# Patient Record
Sex: Female | Born: 1960 | Race: White | Hispanic: No | Marital: Married | Smoking: Former smoker
Health system: Southern US, Community
[De-identification: ages and names within clinical notes are randomized; demographics above are authoritative.]

## PROBLEM LIST (undated history)

## (undated) DIAGNOSIS — C801 Malignant (primary) neoplasm, unspecified: Secondary | ICD-10-CM

## (undated) DIAGNOSIS — K5790 Diverticulosis of intestine, part unspecified, without perforation or abscess without bleeding: Secondary | ICD-10-CM

## (undated) DIAGNOSIS — F32A Depression, unspecified: Secondary | ICD-10-CM

## (undated) DIAGNOSIS — R000 Tachycardia, unspecified: Secondary | ICD-10-CM

## (undated) DIAGNOSIS — D6862 Lupus anticoagulant syndrome: Secondary | ICD-10-CM

## (undated) DIAGNOSIS — M1990 Unspecified osteoarthritis, unspecified site: Secondary | ICD-10-CM

## (undated) DIAGNOSIS — Q631 Lobulated, fused and horseshoe kidney: Secondary | ICD-10-CM

## (undated) DIAGNOSIS — R519 Headache, unspecified: Secondary | ICD-10-CM

## (undated) DIAGNOSIS — Z8739 Personal history of other diseases of the musculoskeletal system and connective tissue: Secondary | ICD-10-CM

## (undated) DIAGNOSIS — Z9889 Other specified postprocedural states: Secondary | ICD-10-CM

## (undated) DIAGNOSIS — R112 Nausea with vomiting, unspecified: Secondary | ICD-10-CM

## (undated) DIAGNOSIS — C4491 Basal cell carcinoma of skin, unspecified: Secondary | ICD-10-CM

## (undated) DIAGNOSIS — Z86718 Personal history of other venous thrombosis and embolism: Secondary | ICD-10-CM

## (undated) DIAGNOSIS — E663 Overweight: Secondary | ICD-10-CM

## (undated) DIAGNOSIS — H547 Unspecified visual loss: Secondary | ICD-10-CM

## (undated) DIAGNOSIS — F329 Major depressive disorder, single episode, unspecified: Secondary | ICD-10-CM

## (undated) DIAGNOSIS — E039 Hypothyroidism, unspecified: Secondary | ICD-10-CM

## (undated) DIAGNOSIS — G4730 Sleep apnea, unspecified: Secondary | ICD-10-CM

## (undated) DIAGNOSIS — Z8719 Personal history of other diseases of the digestive system: Secondary | ICD-10-CM

## (undated) DIAGNOSIS — T8859XA Other complications of anesthesia, initial encounter: Secondary | ICD-10-CM

## (undated) DIAGNOSIS — D699 Hemorrhagic condition, unspecified: Secondary | ICD-10-CM

## (undated) DIAGNOSIS — J3489 Other specified disorders of nose and nasal sinuses: Secondary | ICD-10-CM

## (undated) DIAGNOSIS — I872 Venous insufficiency (chronic) (peripheral): Secondary | ICD-10-CM

## (undated) DIAGNOSIS — I10 Essential (primary) hypertension: Secondary | ICD-10-CM

## (undated) DIAGNOSIS — Q661 Congenital talipes calcaneovarus, unspecified foot: Secondary | ICD-10-CM

## (undated) DIAGNOSIS — J45909 Unspecified asthma, uncomplicated: Secondary | ICD-10-CM

## (undated) DIAGNOSIS — E042 Nontoxic multinodular goiter: Secondary | ICD-10-CM

## (undated) DIAGNOSIS — F419 Anxiety disorder, unspecified: Secondary | ICD-10-CM

## (undated) DIAGNOSIS — Z9884 Bariatric surgery status: Secondary | ICD-10-CM

## (undated) DIAGNOSIS — J302 Other seasonal allergic rhinitis: Secondary | ICD-10-CM

## (undated) DIAGNOSIS — N200 Calculus of kidney: Secondary | ICD-10-CM

## (undated) DIAGNOSIS — E890 Postprocedural hypothyroidism: Secondary | ICD-10-CM

## (undated) DIAGNOSIS — Z87442 Personal history of urinary calculi: Secondary | ICD-10-CM

## (undated) HISTORY — PX: ABDOMINAL HERNIA REPAIR: SHX539

## (undated) HISTORY — DX: Unspecified osteoarthritis, unspecified site: M19.90

## (undated) HISTORY — DX: Congenital talipes calcaneovarus, unspecified foot: Q66.10

## (undated) HISTORY — DX: Depression, unspecified: F32.A

## (undated) HISTORY — DX: Lupus anticoagulant syndrome: D68.62

## (undated) HISTORY — DX: Other seasonal allergic rhinitis: J30.2

## (undated) HISTORY — DX: Bariatric surgery status: Z98.84

## (undated) HISTORY — DX: Diverticulosis of intestine, part unspecified, without perforation or abscess without bleeding: K57.90

## (undated) HISTORY — DX: Venous insufficiency (chronic) (peripheral): I87.2

## (undated) HISTORY — DX: Major depressive disorder, single episode, unspecified: F32.9

## (undated) HISTORY — DX: Personal history of other venous thrombosis and embolism: Z86.718

## (undated) HISTORY — PX: JOINT REPLACEMENT: SHX530

## (undated) HISTORY — DX: Calculus of kidney: N20.0

## (undated) HISTORY — PX: ABDOMINAL HYSTERECTOMY: SHX81

## (undated) HISTORY — DX: Overweight: E66.3

## (undated) HISTORY — PX: OTHER SURGICAL HISTORY: SHX169

## (undated) HISTORY — DX: Essential (primary) hypertension: I10

## (undated) HISTORY — DX: Unspecified asthma, uncomplicated: J45.909

## (undated) HISTORY — DX: Nontoxic multinodular goiter: E04.2

## (undated) HISTORY — DX: Lobulated, fused and horseshoe kidney: Q63.1

## (undated) HISTORY — DX: Postprocedural hypothyroidism: E89.0

## (undated) HISTORY — PX: HERNIA REPAIR: SHX51

## (undated) HISTORY — PX: CYST EXCISION: SHX5701

## (undated) HISTORY — PX: SKIN SURGERY: SHX2413

## (undated) HISTORY — DX: Personal history of other diseases of the digestive system: Z87.19

## (undated) HISTORY — DX: Other specified disorders of nose and nasal sinuses: J34.89

## (undated) HISTORY — DX: Hypothyroidism, unspecified: E03.9

## (undated) HISTORY — DX: Hemorrhagic condition, unspecified: D69.9

---
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
## 1987-06-17 HISTORY — PX: PARTIAL HYSTERECTOMY: SHX80

## 1996-06-16 HISTORY — PX: CHOLECYSTECTOMY: SHX55

## 2004-06-11 ENCOUNTER — Ambulatory Visit: Payer: Self-pay | Admitting: Family Medicine

## 2004-06-13 ENCOUNTER — Ambulatory Visit: Payer: Self-pay | Admitting: Family Medicine

## 2004-10-07 ENCOUNTER — Ambulatory Visit: Payer: Self-pay | Admitting: Surgery

## 2004-10-22 ENCOUNTER — Other Ambulatory Visit: Payer: Self-pay

## 2004-10-24 ENCOUNTER — Ambulatory Visit: Payer: Self-pay | Admitting: Surgery

## 2004-12-13 ENCOUNTER — Ambulatory Visit: Payer: Self-pay | Admitting: Family Medicine

## 2004-12-13 ENCOUNTER — Other Ambulatory Visit: Payer: Self-pay

## 2005-05-21 DIAGNOSIS — Z9884 Bariatric surgery status: Secondary | ICD-10-CM

## 2005-05-21 HISTORY — DX: Bariatric surgery status: Z98.84

## 2005-05-21 HISTORY — PX: GASTRIC BYPASS: SHX52

## 2005-06-16 DIAGNOSIS — I872 Venous insufficiency (chronic) (peripheral): Secondary | ICD-10-CM

## 2005-06-16 HISTORY — DX: Venous insufficiency (chronic) (peripheral): I87.2

## 2005-10-24 ENCOUNTER — Ambulatory Visit: Payer: Self-pay | Admitting: Family Medicine

## 2007-03-03 ENCOUNTER — Ambulatory Visit: Payer: Self-pay | Admitting: Family Medicine

## 2007-06-17 DIAGNOSIS — Z8711 Personal history of peptic ulcer disease: Secondary | ICD-10-CM

## 2007-06-17 HISTORY — PX: BACK SURGERY: SHX140

## 2007-06-17 HISTORY — DX: Personal history of peptic ulcer disease: Z87.11

## 2008-04-05 ENCOUNTER — Ambulatory Visit: Payer: Self-pay | Admitting: Unknown Physician Specialty

## 2008-04-06 ENCOUNTER — Ambulatory Visit: Payer: Self-pay | Admitting: Unknown Physician Specialty

## 2008-04-30 ENCOUNTER — Encounter: Admission: RE | Admit: 2008-04-30 | Discharge: 2008-04-30 | Payer: Self-pay | Admitting: Neurosurgery

## 2008-05-16 ENCOUNTER — Ambulatory Visit (HOSPITAL_COMMUNITY): Admission: RE | Admit: 2008-05-16 | Discharge: 2008-05-16 | Payer: Self-pay | Admitting: Neurosurgery

## 2008-06-14 ENCOUNTER — Ambulatory Visit (HOSPITAL_COMMUNITY): Admission: RE | Admit: 2008-06-14 | Discharge: 2008-06-14 | Payer: Self-pay | Admitting: Neurosurgery

## 2008-06-14 ENCOUNTER — Encounter (INDEPENDENT_AMBULATORY_CARE_PROVIDER_SITE_OTHER): Payer: Self-pay | Admitting: Neurosurgery

## 2008-07-07 ENCOUNTER — Ambulatory Visit: Payer: Self-pay | Admitting: Urology

## 2008-07-10 ENCOUNTER — Ambulatory Visit: Payer: Self-pay | Admitting: Urology

## 2008-07-13 ENCOUNTER — Ambulatory Visit: Payer: Self-pay | Admitting: Urology

## 2008-07-17 ENCOUNTER — Ambulatory Visit: Payer: Self-pay | Admitting: Internal Medicine

## 2008-07-17 ENCOUNTER — Inpatient Hospital Stay: Payer: Self-pay | Admitting: Urology

## 2008-07-31 ENCOUNTER — Ambulatory Visit: Payer: Self-pay | Admitting: Internal Medicine

## 2008-07-31 ENCOUNTER — Ambulatory Visit: Payer: Self-pay | Admitting: Urology

## 2008-08-10 ENCOUNTER — Ambulatory Visit: Payer: Self-pay | Admitting: Urology

## 2008-08-14 ENCOUNTER — Ambulatory Visit: Payer: Self-pay | Admitting: Internal Medicine

## 2008-08-21 ENCOUNTER — Ambulatory Visit: Payer: Self-pay | Admitting: Urology

## 2008-09-07 ENCOUNTER — Ambulatory Visit: Payer: Self-pay | Admitting: Urology

## 2008-09-11 ENCOUNTER — Ambulatory Visit: Payer: Self-pay | Admitting: Urology

## 2008-09-14 ENCOUNTER — Ambulatory Visit: Payer: Self-pay | Admitting: Internal Medicine

## 2008-09-25 ENCOUNTER — Ambulatory Visit: Payer: Self-pay | Admitting: Urology

## 2008-10-14 ENCOUNTER — Ambulatory Visit: Payer: Self-pay | Admitting: Internal Medicine

## 2008-10-26 ENCOUNTER — Ambulatory Visit: Payer: Self-pay | Admitting: Cardiology

## 2008-10-26 ENCOUNTER — Other Ambulatory Visit: Payer: Self-pay | Admitting: Anesthesiology

## 2008-10-30 ENCOUNTER — Ambulatory Visit: Payer: Self-pay | Admitting: Urology

## 2008-11-08 ENCOUNTER — Ambulatory Visit: Payer: Self-pay | Admitting: Urology

## 2009-04-09 ENCOUNTER — Ambulatory Visit: Payer: Self-pay | Admitting: Unknown Physician Specialty

## 2009-05-21 ENCOUNTER — Ambulatory Visit: Payer: Self-pay | Admitting: Urology

## 2009-09-12 ENCOUNTER — Ambulatory Visit: Payer: Self-pay | Admitting: Unknown Physician Specialty

## 2010-04-10 ENCOUNTER — Ambulatory Visit: Payer: Self-pay | Admitting: Unknown Physician Specialty

## 2010-04-15 ENCOUNTER — Ambulatory Visit: Payer: Self-pay | Admitting: Unknown Physician Specialty

## 2010-10-29 NOTE — Op Note (Signed)
Claire Strong, Claire Strong                ACCOUNT NO.:  [id number]   MEDICAL RECORD NO.:  [id number]          PATIENT TYPE:  OIB   LOCATION:  5409                         FACILITY:  [facility]   PHYSICIAN:  Ophelia Charter, M.D.DATE OF BIRTH:  [birth year]   DATE OF PROCEDURE:  06/14/2008  DATE OF DISCHARGE:  06/14/2008                               OPERATIVE REPORT   BRIEF HISTORY:  The patient is a 50 year old white female who has had  perineal numbness for sometime.  She was worked up with a lumbar MRI,  which demonstrated the patient had sacral epidural lipomatosis.  She was  here for my consultation.  I worked up further with a lumbar myelo CT,  which demonstrated there was abrupt stoppage of the dye column just  above the sacrum.  I discussed the various treatment options with the  patient including a sacral laminectomy for decompression of her sacral  nerves as well as a removal of the epidural lipomatosis.  The patient  weighed the risks, benefits, and alternatives of the surgery and decided  to proceed with the operation.   PREOPERATIVE DIAGNOSES:  1. Sacral epidural lipomatosis.  2. Lumbago.  3. Lumbar radiculopathy.   POSTOPERATIVE DIAGNOSES:  1. Sacral epidural lipomatosis.  2. Lumbago.  3. Lumbar radiculopathy.   PROCEDURE:  1. Decompressive laminectomy.  2. Removal of epidural lipomatosis using microdissection.   SURGEON:  Ophelia Charter, MD   ASSISTANT:  Hosie Spangle, MD   ANESTHESIA:  General endotracheal.   ESTIMATED BLOOD LOSS:  50 mL.   SPECIMENS:  Epidural tissue.   DRAINS:  None.   COMPLICATIONS:  None.   DESCRIPTION OF PROCEDURE:  The patient was brought to the operating room  by the Anesthesia Team.  General endotracheal anesthesia was induced.  The patient was turned to the prone position on the Wilson frame.  Her  lumbosacral region was then prepared with Betadine scrub and Betadine  solution.  Sterile drapes were applied.  I then injected  the area to be  incised with Marcaine with epinephrine solution.  I used a scalpel to  make a linear midline incision over the upper sacrum.  I used  electrocautery to perform a bilateral subperiosteal dissection exposing  the spinous process and lamina of the upper sacrum.  I used the  cerebellar retraction for exposure.  We obtained intraoperative  radiograph to confirm our location.   We began the decompression by using a high-speed drill to thin out the  midline cephalad sacral lamina.  We drilled until the lamina was very  thin.  We then used a Kerrison punch to remove the approximately S1 and  S2 lamina.  We also removed the ligamentum flavum at L5-S1.   We then brought the operative microscope into the field, and under  electromagnification and illumination, we completed the  microdissection/decompression.  We used microdissection to free up the  thecal sac and the sacral nerve roots from the epidural lipomatosis,  removed the epidural fat in multiple fragments using the micropituitary  forceps.  We sent the specimens off to the Pathology to  confirm simple  epidural fat.  We got good decompression of the caudal thecal sac and  the extending sacral nerve roots.  Of note, the patient did appear to  have good joint S1, S2, and S3 nerve roots on the right.  We then  obtained hemostasis using bipolar electrocautery.  We irrigated the  wound out with bacitracin solution.  We removed the retractors and then  reapproximated the patient's thoracolumbar fascia with interrupted #1  Vicryl suture, subcutaneous tissue with interrupted 2-0 Vicryl suture,  and the skin with Steri-Strips and Benzoin.  The wound was then coated  with bacitracin ointment and sterile dressing applied.  The drapes were  removed and the patient was subsequently returned to supine position  where she was extubated by the anesthesia team and transported to  postanesthesia care unit in stable condition.  All sponge,  instrument,  and needle counts were correct at the end of the case.      Cristi Loron, M.D.  Electronically Signed     JDJ/MEDQ  D:  06/15/2008  T:  06/16/2008  Job:  [id number]

## 2010-12-19 ENCOUNTER — Ambulatory Visit (INDEPENDENT_AMBULATORY_CARE_PROVIDER_SITE_OTHER): Payer: BC Managed Care – PPO | Admitting: Family Medicine

## 2010-12-19 ENCOUNTER — Encounter: Payer: Self-pay | Admitting: Family Medicine

## 2010-12-19 VITALS — BP 124/80 | HR 64 | Temp 98.5°F | Ht 69.0 in | Wt 207.0 lb

## 2010-12-19 DIAGNOSIS — F3289 Other specified depressive episodes: Secondary | ICD-10-CM

## 2010-12-19 DIAGNOSIS — Q631 Lobulated, fused and horseshoe kidney: Secondary | ICD-10-CM

## 2010-12-19 DIAGNOSIS — D6859 Other primary thrombophilia: Secondary | ICD-10-CM

## 2010-12-19 DIAGNOSIS — F32A Depression, unspecified: Secondary | ICD-10-CM | POA: Insufficient documentation

## 2010-12-19 DIAGNOSIS — I10 Essential (primary) hypertension: Secondary | ICD-10-CM

## 2010-12-19 DIAGNOSIS — J3489 Other specified disorders of nose and nasal sinuses: Secondary | ICD-10-CM

## 2010-12-19 DIAGNOSIS — Q638 Other specified congenital malformations of kidney: Secondary | ICD-10-CM

## 2010-12-19 DIAGNOSIS — J209 Acute bronchitis, unspecified: Secondary | ICD-10-CM | POA: Insufficient documentation

## 2010-12-19 DIAGNOSIS — F329 Major depressive disorder, single episode, unspecified: Secondary | ICD-10-CM

## 2010-12-19 DIAGNOSIS — R05 Cough: Secondary | ICD-10-CM

## 2010-12-19 DIAGNOSIS — D6862 Lupus anticoagulant syndrome: Secondary | ICD-10-CM | POA: Insufficient documentation

## 2010-12-19 DIAGNOSIS — Z23 Encounter for immunization: Secondary | ICD-10-CM

## 2010-12-19 DIAGNOSIS — Z9884 Bariatric surgery status: Secondary | ICD-10-CM

## 2010-12-19 DIAGNOSIS — R059 Cough, unspecified: Secondary | ICD-10-CM

## 2010-12-19 LAB — BASIC METABOLIC PANEL
CO2: 26 mEq/L (ref 19–32)
Calcium: 8.4 mg/dL (ref 8.4–10.5)
Creatinine, Ser: 0.8 mg/dL (ref 0.4–1.2)
Potassium: 3.8 mEq/L (ref 3.5–5.1)
Sodium: 136 mEq/L (ref 135–145)

## 2010-12-19 MED ORDER — EPINEPHRINE 0.3 MG/0.3ML IJ DEVI: 0.3000 mg | Freq: Once | INTRAMUSCULAR | Status: DC

## 2010-12-19 MED ORDER — FLUTICASONE-SALMETEROL 100-50 MCG/DOSE IN AEPB: 1.0000 | INHALATION_SPRAY | Freq: Two times a day (BID) | RESPIRATORY_TRACT | Status: DC

## 2010-12-19 NOTE — Assessment & Plan Note (Signed)
Stable on low dose ACEI.  Continue med. Await records.

## 2010-12-19 NOTE — Assessment & Plan Note (Signed)
Doesn't think improving on celexa 20mg .  Consider increasing to 40mg  daily.  Did not have time to further discuss, will more thoroughly eval next visit.

## 2010-12-19 NOTE — Assessment & Plan Note (Signed)
States gets labwork yearly.  Will obtain next physical (iron, vitamin levels, etc)

## 2010-12-19 NOTE — Assessment & Plan Note (Signed)
Seems stable, but given pt endorsing increasing in size, will refer to ent for their opinion.

## 2010-12-19 NOTE — Progress Notes (Signed)
Subjective:    Patient ID: Claire Strong, female    DOB: 14-Mar-1961, 50 y.o.   MRN: [id number]  HPI CC: new patient, establish  Previous PCP Dr. Yetta Barre.  First time here, has been here with mother in law and husband recently Advertising copywriter).  Cough - for 3-4 wks.  Actually subsided a lot.  Not productive.  No fevers.  + swollen glands.  No ST, abd pain, ear pain or tooth pain, + sinus congestion, better now.  Chloraseptic didn't help.  Took albuterol and advair.  Tried allegra, helped some.  H/o deviated septum s/p surgery as teenager, states stents stuck together and since then has had hole in septum.  States getting larger, nontender.  Denies rec drug use.  H/o horseshoe kidney (sole), R kidney damaged.  H/o kidney stones.  Sees Dr. [facility]. Lupus anticoagulant positive.  Previously saw Dr. Sherrlyn Hock Heme for lupus anticoagulant, released. H/o back surgery ([facility] Dr. Judie Grieve), released.  Requests epi pen, allergic to bees.  Preventative: Last CPE 03/2010, with blood work. Well woman at Dr. Ashley Jacobs, office closed. Last mammogram 03/2010.  Last pap smear 2009, always normal. S/p hysterectomy for uterine prolapse 1989.  Ovaries remain. Thinks may be due for tetanus shot.  Medications and allergies reviewed and updated in chart. There is no problem list on file for this patient.  Past Medical History  Diagnosis Date  . Lupus anticoagulant disorder   . Arthritis   . Depression   . HTN (hypertension)   . Kidney stones     s/p renal hematoma after lithotripsy  . Horseshoe kidney     sole, R kidney damage  . 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 Jacqulyn Ducking  . Ulcer 2009  . DVT (deep venous thrombosis)     after 1st pregnancy   Past Surgical History  Procedure Date  . Gastric bypass 05/21/05    Dr. Jacqulyn Ducking, Roux-en-Y  . Partial hysterectomy 1989    ovaries remain  . Back surgery 2009  . Cholecystectomy 1998  . Appendectomy 1978  . Tonsillectomy  1969  . Nasal septum surgery 1981    deviated septum, repaired at [facility]   History  Substance Use Topics  . Smoking status: Former Smoker    Quit date: 06/16/2004  . Smokeless tobacco: Never Used   Comment: Socially-no longer  . Alcohol Use: Yes     Occasionally-wine   Family History  Problem Relation Age of Onset  . Diabetes Mother     prediabetes  . Alzheimer's disease Mother   . Hyperlipidemia Mother   . Diabetes Father   . Coronary artery disease Father     CABG  . Alcohol abuse Father   . Cancer Father     lung  . Cancer Maternal Uncle     brain  . Diabetes Paternal Grandmother   . Stroke Neg Hx    Allergies  Allergen Reactions  . Sulfa Antibiotics Shortness Of Breath  . Bee Swelling   No current outpatient prescriptions on file prior to visit.   Review of Systems  Constitutional: Negative for fever, chills, activity change, appetite change, fatigue and unexpected weight change.  HENT: Negative for hearing loss and neck pain.   Eyes: Negative for visual disturbance.  Respiratory: Negative for cough, chest tightness, shortness of breath and wheezing.   Cardiovascular: Negative for chest pain, palpitations and leg swelling.  Gastrointestinal: Negative for nausea, vomiting, abdominal pain, diarrhea, constipation, blood in stool and abdominal distention.  Genitourinary: Negative for hematuria and difficulty urinating.  Musculoskeletal: Negative for myalgias and arthralgias.  Skin: Negative for rash.  Neurological: Negative for dizziness, seizures, syncope and headaches.  Hematological: Does not bruise/bleed easily.  Psychiatric/Behavioral: Positive for dysphoric mood. The patient is not nervous/anxious.        Objective:   Physical Exam  Nursing note and vitals reviewed. Constitutional: She is oriented to person, place, and time. She appears well-developed and well-nourished. No distress.  HENT:  Head: Normocephalic and atraumatic.  Right Ear: Hearing,  tympanic membrane, external ear and ear canal normal.  Left Ear: Hearing, tympanic membrane, external ear and ear canal normal.  Nose: Nasal deformity present.  Mouth/Throat: Uvula is midline, oropharynx is clear and moist and mucous membranes are normal. No oropharyngeal exudate, posterior oropharyngeal edema, posterior oropharyngeal erythema or tonsillar abscesses.       Large midline nasal septal perforation  Eyes: Conjunctivae and EOM are normal. Pupils are equal, round, and reactive to light. No scleral icterus.  Neck: Normal range of motion. Neck supple. No thyromegaly present.  Cardiovascular: Normal rate, regular rhythm, normal heart sounds and intact distal pulses.   No murmur heard. Pulses:      Radial pulses are 2+ on the right side, and 2+ on the left side.  Pulmonary/Chest: Effort normal and breath sounds normal. No respiratory distress. She has no wheezes. She has no rales.  Abdominal: Soft. Bowel sounds are normal. She exhibits no distension and no mass. There is no tenderness. There is no rebound and no guarding.  Musculoskeletal: Normal range of motion.  Lymphadenopathy:    She has no cervical adenopathy.  Neurological: She is alert and oriented to person, place, and time.       CN grossly intact, station and gait intact  Skin: Skin is warm and dry. No rash noted.  Psychiatric: She has a normal mood and affect. Her behavior is normal. Judgment and thought content normal.          Assessment & Plan:

## 2010-12-19 NOTE — Assessment & Plan Note (Signed)
Going on 3-4 wks, actually improving. Likely post-viral cough, continue to monitor. Pt states has been told has asthma, will have her do spirometry pre/post alb when returns for f/u. Refilled advair in meantime.

## 2010-12-19 NOTE — Patient Instructions (Addendum)
Epi pen sent in as well as refills needed. advair refill as well as samples. Tdap today. Good to see you today, call us with questions. Return in 1-2 months for follow up and discuss celexa. I will look into treatment of hole in nose and may refer you to ENT.  If we do we will give you a call regarding referral. I will request records from previously physicians. Return in October for physical and fasting blood work prior.

## 2010-12-19 NOTE — Assessment & Plan Note (Signed)
Per pt, will request records from urology. Check Cr today for baseline while we get records.

## 2010-12-23 ENCOUNTER — Ambulatory Visit: Payer: Self-pay | Admitting: Family Medicine

## 2010-12-30 ENCOUNTER — Ambulatory Visit: Payer: Self-pay | Admitting: Family Medicine

## 2011-01-24 ENCOUNTER — Ambulatory Visit: Payer: Self-pay | Admitting: Otolaryngology

## 2011-01-29 ENCOUNTER — Ambulatory Visit: Payer: Self-pay | Admitting: Otolaryngology

## 2011-02-20 ENCOUNTER — Ambulatory Visit: Payer: BC Managed Care – PPO | Admitting: Family Medicine

## 2011-02-24 ENCOUNTER — Encounter: Payer: Self-pay | Admitting: Family Medicine

## 2011-02-24 ENCOUNTER — Ambulatory Visit (INDEPENDENT_AMBULATORY_CARE_PROVIDER_SITE_OTHER): Payer: BC Managed Care – PPO | Admitting: Family Medicine

## 2011-02-24 VITALS — BP 110/80 | HR 80 | Temp 98.6°F | Wt 210.0 lb

## 2011-02-24 DIAGNOSIS — F3289 Other specified depressive episodes: Secondary | ICD-10-CM

## 2011-02-24 DIAGNOSIS — I10 Essential (primary) hypertension: Secondary | ICD-10-CM

## 2011-02-24 DIAGNOSIS — J3489 Other specified disorders of nose and nasal sinuses: Secondary | ICD-10-CM

## 2011-02-24 DIAGNOSIS — Z9884 Bariatric surgery status: Secondary | ICD-10-CM

## 2011-02-24 DIAGNOSIS — Q631 Lobulated, fused and horseshoe kidney: Secondary | ICD-10-CM

## 2011-02-24 DIAGNOSIS — Q638 Other specified congenital malformations of kidney: Secondary | ICD-10-CM

## 2011-02-24 DIAGNOSIS — J40 Bronchitis, not specified as acute or chronic: Secondary | ICD-10-CM

## 2011-02-24 DIAGNOSIS — F329 Major depressive disorder, single episode, unspecified: Secondary | ICD-10-CM

## 2011-02-24 DIAGNOSIS — F32A Depression, unspecified: Secondary | ICD-10-CM

## 2011-02-24 NOTE — Assessment & Plan Note (Signed)
Now followed by ENT.  They are hesitant for surgery.

## 2011-02-24 NOTE — Assessment & Plan Note (Signed)
?   Bronchitis, thought attributable to allergic rhinitis (not asthma as much) Started on singulair daily by ENT, since then noticed significant improvement in cough as well as PNDrip.

## 2011-02-24 NOTE — Progress Notes (Signed)
  Subjective:    Patient ID: Claire Strong, female    DOB: 07-08-1960, 50 y.o.   MRN: [id number]  HPI CC: 1 mo f/u  CPE scheduled for October, coming end of Sept for blood work.  Needs CBC, iron panel, calcium, Vit B1, B12, folate and vit D.  Saw ENT doc for nasal perf - was infected and treating with vaseline which has helped.  No surgery but he will keep eye on this. Also found to have thyroid nodules and monitoring with rpt [place] in 6 mo.  TSH normal. Started on singulair and to start allergy shots Thursday.  Allergic to 7/15 molds.  Starting singulair has significantly helped.  Not taking advair as much.  PNDrip significantly improved as well.  Mood - not good, could be going through menopause.  Hysterectomy 23 years ago, kept ovaries.  On celexa 20mg  for 3 months now.  Not noticed much improvement since starting med.  A lot going on with husband and at home.    GYN - previously saw Dr. Davis Gourd, he is no longer in practice.  Started on celexa by him.  Would like to have well woman done here.  Last mammo 03/2010, done at [place].  Review of Systems Per HPI    Objective:   Physical Exam  Nursing note and vitals reviewed. Constitutional: She appears well-developed and well-nourished. No distress.  HENT:  Head: Normocephalic and atraumatic.  Right Ear: External ear normal.  Left Ear: External ear normal.  Nose: Nose normal.  Mouth/Throat: Oropharynx is clear and moist. No oropharyngeal exudate.  Eyes: Conjunctivae and EOM are normal. Pupils are equal, round, and reactive to light. No scleral icterus.  Neck: Normal range of motion. Neck supple.  Cardiovascular: Normal rate, regular rhythm, normal heart sounds and intact distal pulses.   No murmur heard. Pulmonary/Chest: Effort normal and breath sounds normal. No respiratory distress. She has no wheezes. She has no rales.  Musculoskeletal: She exhibits no edema.  Lymphadenopathy:    She has no cervical adenopathy.  Skin: Skin is warm  and dry. No rash noted.  Psychiatric: She has a normal mood and affect.          Assessment & Plan:

## 2011-02-24 NOTE — Assessment & Plan Note (Signed)
Check vitamin levels when returns for blood work.

## 2011-02-24 NOTE — Assessment & Plan Note (Signed)
BP: 110/80 mmHg   Good control.  Continue ACEI.

## 2011-02-24 NOTE — Patient Instructions (Signed)
Return for blood work fasting at end of month. Increase celexa to 30mg  daily (1 1/2 tabs). we will recheck things next month. Good to see you today!  I'm glad singulair is helping, we may do spirometry to fully evaluate asthma in future.

## 2011-02-24 NOTE — Assessment & Plan Note (Signed)
PHQ-9 = 22/27, somewhat difficult ability to function. Not optimal, increased stress over last several months. Discussed caregiver burnout.  Increase celexa to 30mg  daily.   Return next month for f/u at CPE.  Then consider adjuvant vs change to other med depending on reaction to med.

## 2011-03-12 ENCOUNTER — Other Ambulatory Visit (INDEPENDENT_AMBULATORY_CARE_PROVIDER_SITE_OTHER): Payer: BC Managed Care – PPO

## 2011-03-12 DIAGNOSIS — Q638 Other specified congenital malformations of kidney: Secondary | ICD-10-CM

## 2011-03-12 DIAGNOSIS — I10 Essential (primary) hypertension: Secondary | ICD-10-CM

## 2011-03-12 DIAGNOSIS — Q631 Lobulated, fused and horseshoe kidney: Secondary | ICD-10-CM

## 2011-03-12 DIAGNOSIS — Z9884 Bariatric surgery status: Secondary | ICD-10-CM

## 2011-03-12 LAB — CBC WITH DIFFERENTIAL/PLATELET
Basophils Absolute: 0 10*3/uL (ref 0.0–0.1)
HCT: 40 % (ref 36.0–46.0)
Hemoglobin: 13.8 g/dL (ref 12.0–15.0)
Lymphocytes Relative: 34.6 % (ref 12.0–46.0)
Lymphs Abs: 1.1 10*3/uL (ref 0.7–4.0)
MCHC: 34.4 g/dL (ref 30.0–36.0)
MCV: 91.7 fl (ref 78.0–100.0)
Monocytes Relative: 5.4 % (ref 3.0–12.0)
Neutro Abs: 1.9 10*3/uL (ref 1.4–7.7)
Platelets: 144 10*3/uL — ABNORMAL LOW (ref 150.0–400.0)
RDW: 11.8 % (ref 11.5–14.6)
WBC: 3.2 10*3/uL — ABNORMAL LOW (ref 4.5–10.5)

## 2011-03-12 LAB — LIPID PANEL
LDL Cholesterol: 102 mg/dL — ABNORMAL HIGH (ref 0–99)
Total CHOL/HDL Ratio: 3

## 2011-03-12 LAB — BASIC METABOLIC PANEL
CO2: 29 mEq/L (ref 19–32)
Chloride: 107 mEq/L (ref 96–112)
Creatinine, Ser: 0.9 mg/dL (ref 0.4–1.2)
Glucose, Bld: 77 mg/dL (ref 70–99)
Potassium: 4.3 mEq/L (ref 3.5–5.1)

## 2011-03-12 LAB — HEPATIC FUNCTION PANEL: Albumin: 3.9 g/dL (ref 3.5–5.2)

## 2011-03-12 LAB — FERRITIN: Ferritin: 87.2 ng/mL (ref 10.0–291.0)

## 2011-03-12 LAB — IBC PANEL
Iron: 102 ug/dL (ref 42–145)
Transferrin: 191.4 mg/dL — ABNORMAL LOW (ref 212.0–360.0)

## 2011-03-12 NOTE — Progress Notes (Signed)
Addended by: Jobie Quaker on: 03/12/2011 02:36 PM   Modules accepted: Orders

## 2011-03-13 ENCOUNTER — Encounter: Payer: Self-pay | Admitting: Family Medicine

## 2011-03-13 LAB — VITAMIN D 25 HYDROXY (VIT D DEFICIENCY, FRACTURES): Vit D, 25-Hydroxy: 48 ng/mL (ref 30–89)

## 2011-03-14 LAB — VITAMIN B1

## 2011-03-18 ENCOUNTER — Ambulatory Visit (INDEPENDENT_AMBULATORY_CARE_PROVIDER_SITE_OTHER): Payer: BC Managed Care – PPO | Admitting: Family Medicine

## 2011-03-18 ENCOUNTER — Encounter: Payer: Self-pay | Admitting: Family Medicine

## 2011-03-18 VITALS — BP 110/78 | HR 64 | Temp 98.9°F | Wt 208.8 lb

## 2011-03-18 DIAGNOSIS — M542 Cervicalgia: Secondary | ICD-10-CM

## 2011-03-18 DIAGNOSIS — Z23 Encounter for immunization: Secondary | ICD-10-CM

## 2011-03-18 DIAGNOSIS — Z9884 Bariatric surgery status: Secondary | ICD-10-CM

## 2011-03-18 DIAGNOSIS — Z1231 Encounter for screening mammogram for malignant neoplasm of breast: Secondary | ICD-10-CM

## 2011-03-18 DIAGNOSIS — F3289 Other specified depressive episodes: Secondary | ICD-10-CM

## 2011-03-18 DIAGNOSIS — Z0000 Encounter for general adult medical examination without abnormal findings: Secondary | ICD-10-CM

## 2011-03-18 DIAGNOSIS — F32A Depression, unspecified: Secondary | ICD-10-CM

## 2011-03-18 DIAGNOSIS — F329 Major depressive disorder, single episode, unspecified: Secondary | ICD-10-CM

## 2011-03-18 DIAGNOSIS — N951 Menopausal and female climacteric states: Secondary | ICD-10-CM

## 2011-03-18 DIAGNOSIS — R232 Flushing: Secondary | ICD-10-CM | POA: Insufficient documentation

## 2011-03-18 MED ORDER — CITALOPRAM HYDROBROMIDE 40 MG PO TABS: 40.0000 mg | ORAL_TABLET | Freq: Every day | ORAL | Status: DC

## 2011-03-18 NOTE — Assessment & Plan Note (Addendum)
Anticipate lumbar strain.  Trial of 1/2 dose soma during day.  If not improved, consider xray.  No red flags today.

## 2011-03-18 NOTE — Progress Notes (Signed)
Subjective:    Patient ID: Claire Strong, female    DOB: 1960-06-25, 50 y.o.   MRN: [id number]  HPI CC: CPE today  Having significant hot flashes.  H/o lupus anticoagulant positive, h/o DVTs after pregnancy.  Did take hormonal replacement for years but stopped.  On cexela 30mg  daily.  Gets allergy shots at [place], [place].  Also singulair.  Not using advair as much because improved control with these meds.  Neck pain - going on since pregnancy.  Saw chiropractor and improved, now recently returning.  Notes when walking, describes as jarring pain.  No radiculopathy or tingling/numbness down arms, no weakness.  Preventative:  Last CPE 03/2010, with blood work. Well woman previously at Dr. Leonides Schanz, office closed.  Will receive here. Last mammogram 03/2010. Would like [place] to schedule.  Last pap smear 2009, always normal. S/p hysterectomy for uterine prolapse 1989. Ovaries remain. tdap given 12/19/2010.  Flu shot today.  LMP: 04/1988  Medications and allergies reviewed and updated in chart.  Past histories reviewed and updated if relevant as below. Patient Active Problem List  Diagnoses  . Nasal septal perforation  . S/P gastric bypass  . Horseshoe kidney  . HTN (hypertension)  . Depression  . Lupus anticoagulant disorder  . Bronchitis  . Healthcare maintenance   Past Medical History  Diagnosis Date  . Lupus anticoagulant disorder   . Arthritis   . Depression   . HTN (hypertension)   . Kidney stones     s/p renal hematoma after lithotripsy  . Horseshoe kidney     sole, R kidney damage  . 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 Frutoso Chase  . Ulcer 2009  . History of DVT (deep vein thrombosis)     after 1st pregnancy  . Nasal septal perforation     chronic, rec avoid antihistamine, INS  . Seasonal allergies     allergy shots, singulair  . Thyromegaly     followed by ENT   Past Surgical History  Procedure Date  . Gastric bypass 05/21/05    Dr. Frutoso Chase, Roux-en-Y    . Partial hysterectomy 1989    ovaries remain  . Back surgery 2009  . Cholecystectomy 1998  . Appendectomy 1978  . Tonsillectomy 1969  . Nasal septum surgery 1981    deviated septum, [facility] at [facility]   History  Substance Use Topics  . Smoking status: Former Smoker    Quit date: 06/16/2004  . Smokeless tobacco: Never Used   Comment: Socially-no longer  . Alcohol Use: Yes     Occasionally-wine   Family History  Problem Relation Age of Onset  . Diabetes Mother     prediabetes  . Alzheimer's disease Mother   . Hyperlipidemia Mother   . Diabetes Father   . Coronary artery disease Father     CABG  . Alcohol abuse Father   . Cancer Father     lung  . Cancer Maternal Uncle     brain  . Diabetes Paternal Grandmother   . Stroke Neg Hx    Allergies  Allergen Reactions  . Sulfa Antibiotics Shortness Of Breath  . Bee Swelling   Current Outpatient Prescriptions on File Prior to Visit  Medication Sig Dispense Refill  . Albuterol Sulfate (VENTOLIN HFA IN) Inhale into the lungs as needed.        . B Complex-C (SUPER B COMPLEX PO) Take 1 capsule by mouth daily.        . benazepril (LOTENSIN) 10 MG  tablet Take 10 mg by mouth daily.        . carisoprodol (SOMA) 350 MG tablet Take 350 mg by mouth at bedtime.       . Cholecalciferol (VITAMIN D3) 1000 UNITS tablet Take 1,000 Units by mouth daily.        . cyanocobalamin (,VITAMIN B-12,) 1000 MCG/ML injection Inject 1,000 mcg into the muscle every 30 (thirty) days.        Marland Kitchen EPINEPHrine (EPI-PEN) 0.3 mg/0.3 mL DEVI Inject 0.3 mLs (0.3 mg total) into the muscle once.  1 Device  2  . Fluticasone-Salmeterol (ADVAIR DISKUS) 100-50 MCG/DOSE AEPB Inhale 1 puff into the lungs every 12 (twelve) hours.  60 each  6  . LORazepam (ATIVAN) 0.5 MG tablet Take 0.5 mg by mouth 2 (two) times daily.        . montelukast (SINGULAIR) 10 MG tablet Take 10 mg by mouth at bedtime.        . Multiple Vitamin (MULTIVITAMIN) tablet Take 1 tablet by mouth daily.          Review of Systems  Constitutional: Negative for fever, chills, activity change, appetite change, fatigue and unexpected weight change.  HENT: Negative for hearing loss and neck pain.   Eyes: Negative for visual disturbance.  Respiratory: Negative for cough, chest tightness, shortness of breath and wheezing.   Cardiovascular: Negative for chest pain, palpitations and leg swelling.  Gastrointestinal: Negative for nausea, vomiting, abdominal pain, diarrhea, constipation, blood in stool and abdominal distention.  Genitourinary: Negative for hematuria and difficulty urinating.  Musculoskeletal: Negative for myalgias and arthralgias.  Skin: Negative for rash.  Neurological: Negative for dizziness, seizures, syncope and headaches.  Hematological: Does not bruise/bleed easily.  Psychiatric/Behavioral: Negative for dysphoric mood. The patient is not nervous/anxious.        Objective:   Physical Exam  Nursing note and vitals reviewed. Constitutional: She is oriented to person, place, and time. She appears well-developed and well-nourished. No distress.  HENT:  Head: Normocephalic and atraumatic.  Right Ear: External ear normal.  Left Ear: External ear normal.  Nose: Nose normal.  Mouth/Throat: Oropharynx is clear and moist. No oropharyngeal exudate.  Eyes: Conjunctivae and EOM are normal. Pupils are equal, round, and reactive to light. No scleral icterus.  Neck: Normal range of motion. Neck supple.  Cardiovascular: Normal rate, regular rhythm, normal heart sounds and intact distal pulses.   No murmur heard. Pulses:      Radial pulses are 2+ on the right side, and 2+ on the left side.  Pulmonary/Chest: Effort normal and breath sounds normal. No respiratory distress. She has no wheezes. She has no rales. Right breast exhibits no inverted nipple, no mass, no nipple discharge, no skin change and no tenderness. Left breast exhibits no inverted nipple, no mass, no nipple discharge, no skin  change and no tenderness. Breasts are symmetrical.  Abdominal: Soft. Bowel sounds are normal. She exhibits no distension and no mass. There is no tenderness. There is no rebound and no guarding.  Genitourinary: No breast swelling, tenderness, discharge or bleeding.  Musculoskeletal: Normal range of motion.       No midline spine tenderness. Some tightness trap muscles L>R FROM neck  Lymphadenopathy:    She has no cervical adenopathy.  Neurological: She is alert and oriented to person, place, and time.       CN grossly intact, station and gait intact  Skin: Skin is warm and dry. No rash noted.  Psychiatric: She has a normal  mood and affect.          Assessment & Plan:

## 2011-03-18 NOTE — Assessment & Plan Note (Signed)
Reviewed and updated preventative protocols. Flu shot today. utd tetanus. S/p hysterectomy for prolapse. Breast exam normal.  Set up mammogram today.

## 2011-03-18 NOTE — Patient Instructions (Addendum)
Physical today. Increase celexa to 40mg  daily.  New prescription sent for 40mg  daily. Good to see you today, call us with questions. Pass by [name]'s office to schedule mammogram. B1 (thiamine) lab check today. Try 1/2 soma during day for neck pain.  If not helping, let me know.

## 2011-03-18 NOTE — Assessment & Plan Note (Signed)
Started on celexa by OBGYN. Previously PHQ9 22. Has been taking time for herself - has appreciated this. Mild if any improvement.  Will increase celexa to 40mg  to see if will help with depression and hot flashes.  Pt agrees with plan.

## 2011-03-18 NOTE — Assessment & Plan Note (Signed)
Deteriorating. Recommend increase celexa to 40mg 

## 2011-03-21 LAB — BASIC METABOLIC PANEL
BUN: 10 mg/dL (ref 6–23)
CO2: 27 mEq/L (ref 19–32)
Calcium: 8.4 mg/dL (ref 8.4–10.5)
Chloride: 104 mEq/L (ref 96–112)
Creatinine, Ser: 0.7 mg/dL (ref 0.4–1.2)
Glucose, Bld: 150 mg/dL — ABNORMAL HIGH (ref 70–99)

## 2011-03-21 LAB — CBC
MCHC: 34.1 g/dL (ref 30.0–36.0)
MCV: 92.5 fL (ref 78.0–100.0)
RDW: 12.1 % (ref 11.5–15.5)

## 2011-04-15 ENCOUNTER — Encounter: Payer: Self-pay | Admitting: Family Medicine

## 2011-04-15 ENCOUNTER — Ambulatory Visit: Payer: Self-pay | Admitting: Family Medicine

## 2011-04-16 ENCOUNTER — Encounter: Payer: Self-pay | Admitting: *Deleted

## 2011-04-22 ENCOUNTER — Other Ambulatory Visit: Payer: Self-pay | Admitting: *Deleted

## 2011-04-22 MED ORDER — CARISOPRODOL 350 MG PO TABS: 350.0000 mg | ORAL_TABLET | Freq: Every day | ORAL | Status: DC

## 2011-04-22 NOTE — Telephone Encounter (Signed)
Ok to refill 

## 2011-04-22 NOTE — Telephone Encounter (Signed)
OK to refill

## 2011-04-23 NOTE — Telephone Encounter (Signed)
Rx called in as directed.   

## 2011-05-22 ENCOUNTER — Telehealth: Payer: Self-pay | Admitting: *Deleted

## 2011-05-22 NOTE — Telephone Encounter (Signed)
Patient called and wanted to know if she needed to be seen sooner than her January appt due to headaches, 2 episodes of chest pain, bruising easily and a fall yesterday. She thinks her blood may be too thick due to her lupus. Advised she needed to be seen before January and appt scheduled for tomorrow.

## 2011-05-23 ENCOUNTER — Ambulatory Visit (INDEPENDENT_AMBULATORY_CARE_PROVIDER_SITE_OTHER): Payer: BC Managed Care – PPO | Admitting: Family Medicine

## 2011-05-23 ENCOUNTER — Encounter: Payer: Self-pay | Admitting: Family Medicine

## 2011-05-23 DIAGNOSIS — R079 Chest pain, unspecified: Secondary | ICD-10-CM

## 2011-05-23 DIAGNOSIS — M25569 Pain in unspecified knee: Secondary | ICD-10-CM

## 2011-05-23 DIAGNOSIS — R5383 Other fatigue: Secondary | ICD-10-CM

## 2011-05-23 DIAGNOSIS — D6859 Other primary thrombophilia: Secondary | ICD-10-CM

## 2011-05-23 DIAGNOSIS — D6862 Lupus anticoagulant syndrome: Secondary | ICD-10-CM

## 2011-05-23 DIAGNOSIS — R5381 Other malaise: Secondary | ICD-10-CM

## 2011-05-23 DIAGNOSIS — M25562 Pain in left knee: Secondary | ICD-10-CM

## 2011-05-23 LAB — CBC WITH DIFFERENTIAL/PLATELET
Basophils Absolute: 0 10*3/uL (ref 0.0–0.1)
Eosinophils Absolute: 0.1 10*3/uL (ref 0.0–0.7)
Eosinophils Relative: 2 % (ref 0–5)
Lymphocytes Relative: 38 % (ref 12–46)
Lymphs Abs: 1.9 10*3/uL (ref 0.7–4.0)
MCH: 31 pg (ref 26.0–34.0)
Neutrophils Relative %: 54 % (ref 43–77)
Platelets: 170 10*3/uL (ref 150–400)
RBC: 4.36 MIL/uL (ref 3.87–5.11)
RDW: 12.5 % (ref 11.5–15.5)
WBC: 4.9 10*3/uL (ref 4.0–10.5)

## 2011-05-23 MED ORDER — ASPIRIN EC 81 MG PO TBEC: 81.0000 mg | DELAYED_RELEASE_TABLET | Freq: Every day | ORAL | Status: AC

## 2011-05-23 NOTE — Progress Notes (Signed)
Subjective:    Patient ID: Claire Strong, female    DOB: 12-20-1960, 50 y.o.   MRN: [id number]  HPI CC: chest pain, dizziness, knee pain after fall  1. L knee pain - DOI: 05/21/2011, thinks may have tripped.  Denies premonitory sxs.  Landed on outstretched hands and left knee.  Since then having pain in left knee.  Using neoprene knee brace with hole.  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  Followed by [facility].  2. Having sharp chest pains left chest going on for 1 month, no radiation.  This happens 1x/wk for last 4 wks, lasting 20 min.  Not exhertional, not relieved with rest.  Doesn't think this is anxiety related.  Not associated with SOB, cough.  Associated with lightheadedness.  No vertigo, palpitations.  + HAs.  So far has tried tylenol which hasn't really helped.  No vision changes, nausea/vomiting, coughing, unilateral weakness, confusion, slurred speech.  No recent viral infections.  No sick contacts at home.  Allergies acting up currently.  Not on hormonal meds, no recent travel.  H/o lupus anticoagulant.  Continued fatigue.  H/o PUD 2009.  S/p gastric bypass.  So tries to avoid NSAIDs.  Medications and allergies reviewed and updated in chart.  Past histories reviewed and updated if relevant as below. Patient Active Problem List  Diagnoses  . Nasal septal perforation  . S/P gastric bypass  . Horseshoe kidney  . HTN (hypertension)  . Depression  . Lupus anticoagulant disorder  . Healthcare maintenance  . Neck pain  . Hot flashes   Past Medical History  Diagnosis Date  . Lupus anticoagulant disorder   . Arthritis   . Depression   . HTN (hypertension)   . Kidney stones     s/p renal hematoma after lithotripsy  . Horseshoe kidney     sole, R kidney damage  . 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 Jacqulyn Ducking  . Ulcer 2009  . History of DVT (deep vein thrombosis)     after 1st pregnancy  . Nasal septal perforation      chronic, rec avoid antihistamine, INS  . Seasonal allergies     allergy shots, singulair  . Thyromegaly     followed by ENT   Past Surgical History  Procedure Date  . Gastric bypass 05/21/05    Dr. Jacqulyn Ducking, Roux-en-Y  . Partial hysterectomy 1989    ovaries remain  . Back surgery 2009  . Cholecystectomy 1998  . Appendectomy 1978  . Tonsillectomy 1969  . Nasal septum surgery 1981    deviated septum, repaired at [facility]   History  Substance Use Topics  . Smoking status: Former Smoker    Quit date: 06/16/2004  . Smokeless tobacco: Never Used   Comment: Socially-no longer  . Alcohol Use: Yes     Occasionally-wine   Family History  Problem Relation Age of Onset  . Diabetes Mother     prediabetes  . Alzheimer's disease Mother   . Hyperlipidemia Mother   . Diabetes Father   . Coronary artery disease Father     CABG  . Alcohol abuse Father   . Cancer Father     lung  . Cancer Maternal Uncle     brain  . Diabetes Paternal Grandmother   . Stroke Neg Hx    Allergies  Allergen Reactions  . Sulfa Antibiotics Shortness Of Breath  . Bee Swelling   Current Outpatient Prescriptions  on File Prior to Visit  Medication Sig Dispense Refill  . Albuterol Sulfate (VENTOLIN HFA IN) Inhale into the lungs as needed.        . B Complex-C (SUPER B COMPLEX PO) Take 1 capsule by mouth daily.        . benazepril (LOTENSIN) 10 MG tablet Take 10 mg by mouth daily.        . carisoprodol (SOMA) 350 MG tablet Take 1 tablet (350 mg total) by mouth at bedtime.  30 tablet  0  . Cholecalciferol (VITAMIN D3) 1000 UNITS tablet Take 1,000 Units by mouth daily.        . citalopram (CELEXA) 40 MG tablet Take 1 tablet (40 mg total) by mouth daily.  30 tablet  6  . cyanocobalamin (,VITAMIN B-12,) 1000 MCG/ML injection Inject 1,000 mcg into the muscle every 30 (thirty) days.        . Fluticasone-Salmeterol (ADVAIR DISKUS) 100-50 MCG/DOSE AEPB Inhale 1 puff into the lungs every 12 (twelve) hours.  60 each   6  . montelukast (SINGULAIR) 10 MG tablet Take 10 mg by mouth at bedtime.        . Multiple Vitamin (MULTIVITAMIN) tablet Take 1 tablet by mouth daily.        Marland Kitchen EPINEPHrine (EPI-PEN) 0.3 mg/0.3 mL DEVI Inject 0.3 mLs (0.3 mg total) into the muscle once.  1 Device  2  . LORazepam (ATIVAN) 0.5 MG tablet Take 0.5 mg by mouth 2 (two) times daily.         Review of Systems per HPI   Objective:   Physical Exam  Nursing note and vitals reviewed. Constitutional: She appears well-developed and well-nourished. No distress.  HENT:  Head: Normocephalic and atraumatic.  Mouth/Throat: Oropharynx is clear and moist. No oropharyngeal exudate.  Eyes: Conjunctivae and EOM are normal. Pupils are equal, round, and reactive to light. No scleral icterus.  Neck: Normal range of motion. Neck supple.  Cardiovascular: Normal rate, regular rhythm, normal heart sounds and intact distal pulses.   No murmur heard. Pulmonary/Chest: Effort normal and breath sounds normal. No respiratory distress. She has no wheezes. She has no rales.  Musculoskeletal:       Right knee: Normal.       Left knee: She exhibits decreased range of motion, swelling and ecchymosis. She exhibits no deformity, no erythema, normal alignment, no LCL laxity and no MCL laxity. tenderness (mild) found. Medial joint line and MCL tenderness noted. No patellar tendon tenderness noted.       R knee FROM L knee - decreased ROM 2/2 pain, flexion to ~90d Tender to palpation anterior knee and medial joint line.  + McMurray's, neg drawer test.  No ligament laxity.  Neg PFgrind.  Looser patellar alignment compared to right.  Lymphadenopathy:    She has no cervical adenopathy.  Skin: Skin is warm and dry. No rash noted.  Psychiatric: She has a normal mood and affect.      Assessment & Plan:

## 2011-05-23 NOTE — Patient Instructions (Addendum)
EKG today - looking ok. Blood work today to recheck blood count as well as coagulation studies. If staying low white count, may refer you to hematologist again. For knee - use tylenol 500mg  1-2 pills 3 times a day.  continue icing and using brace.  Elevate leg as much as possible. If not improving, please follow up with orthopedist. Please let me know how dizziness is doing.  Heart seems doing well currently.  Start baby aspirin daily, enteric coated.

## 2011-05-24 LAB — TSH: TSH: 2.116 u[IU]/mL (ref 0.350–4.500)

## 2011-05-24 NOTE — Assessment & Plan Note (Signed)
After fall.  No point tenderness along patella. Anticipate knee strain, could be exacerbation of DJD/meniscus injury. Continue brace, ice.  Use tylenol 500mg -1gm tid regularly.  If not improved, f/u with ortho for further evaluation.

## 2011-05-24 NOTE — Assessment & Plan Note (Addendum)
Does not sound cardiac. EKG today - EKG - sinus brady 52, normal axis, intervals, no hypertrophy, no ST/T changes. Unclear etiology.   Given continued fatigue, check TSH and CBC (in h/o isolated leukopenia 3 mo ago).  Vitamin levels normal last checked (02/2011) ?related to lupus AC disorder, recommended start baby aspirin regularly, reassess next visit. If continued, given associated with lightheadedness, consider ordering holter monitor although pt denies palpitations.

## 2011-06-04 ENCOUNTER — Other Ambulatory Visit: Payer: Self-pay | Admitting: Internal Medicine

## 2011-06-04 MED ORDER — CARISOPRODOL 350 MG PO TABS: 350.0000 mg | ORAL_TABLET | Freq: Every day | ORAL | Status: DC

## 2011-06-04 NOTE — Telephone Encounter (Signed)
plz phone in and notify pt. 

## 2011-06-04 NOTE — Telephone Encounter (Signed)
Patient requesting refill request for [facility]

## 2011-06-04 NOTE — Telephone Encounter (Signed)
Rx called in ans patient aware.

## 2011-06-19 ENCOUNTER — Ambulatory Visit (INDEPENDENT_AMBULATORY_CARE_PROVIDER_SITE_OTHER): Payer: BC Managed Care – PPO | Admitting: Family Medicine

## 2011-06-19 ENCOUNTER — Encounter: Payer: Self-pay | Admitting: Family Medicine

## 2011-06-19 DIAGNOSIS — M25569 Pain in unspecified knee: Secondary | ICD-10-CM

## 2011-06-19 DIAGNOSIS — F329 Major depressive disorder, single episode, unspecified: Secondary | ICD-10-CM

## 2011-06-19 DIAGNOSIS — F32A Depression, unspecified: Secondary | ICD-10-CM

## 2011-06-19 DIAGNOSIS — R079 Chest pain, unspecified: Secondary | ICD-10-CM

## 2011-06-19 DIAGNOSIS — F3289 Other specified depressive episodes: Secondary | ICD-10-CM

## 2011-06-19 DIAGNOSIS — M25562 Pain in left knee: Secondary | ICD-10-CM

## 2011-06-19 MED ORDER — CARISOPRODOL 350 MG PO TABS: 350.0000 mg | ORAL_TABLET | Freq: Every day | ORAL | Status: DC

## 2011-06-19 MED ORDER — CYANOCOBALAMIN 1000 MCG/ML IJ SOLN: 1000.0000 ug | INTRAMUSCULAR | Status: DC

## 2011-06-19 NOTE — Assessment & Plan Note (Signed)
Anticipate related to lupus anticoagulant disorder as improved on baby aspirin daily.  Continue to monitor.  Seems to be resolving.

## 2011-06-19 NOTE — Progress Notes (Signed)
Subjective:    Patient ID: Claire Strong, female    DOB: 05/31/1961, 51 y.o.   MRN: [id number]  HPI CC: 36mo f/u  Claire Strong was seen here 06/02/2011 with some chest pain symptoms of unclear etiology, EKG was stable except for sinus bradycardia, recommended start baby aspirin as wondered if due to lupus anticoagulant hx.  Aspirin has significantly helped symptoms, has only had 1 episode of chest discomfort since last visit.  Knee much better as well.  S/p steroid injection by ortho at [place].  Depression/anxiety - mood getting better.  Tolerating celexa 40mg  daily.  Has not needed any ativan since celexa started.  Uncle with brain cancer passed away 2d ago.  Requests refill of soma bid prn dosing as well as larger vial of B12 as gives herself injections at home.  H/o PUD 2009. S/p gastric bypass. So tries to avoid NSAIDs  Wt Readings from Last 3 Encounters:  06/19/11 212 lb (96.163 kg)  05/23/11 219 lb 4 oz (99.451 kg)  03/18/11 208 lb 12 oz (94.688 kg)  watching what Claire Strong eats.  trying to lose weight  Medications and allergies reviewed and updated in chart.  Past histories reviewed and updated if relevant as below. Patient Active Problem List  Diagnoses  . Nasal septal perforation  . S/P gastric bypass  . Horseshoe kidney  . HTN (hypertension)  . Depression  . Lupus anticoagulant disorder  . Healthcare maintenance  . Neck pain  . Hot flashes  . Left knee pain  . Chest pain   Past Medical History  Diagnosis Date  . Lupus anticoagulant disorder   . Arthritis   . Depression   . HTN (hypertension)   . Kidney stones     s/p renal hematoma after lithotripsy  . Horseshoe kidney     sole, R kidney damage  . 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 Frutoso Chase  . Ulcer 2009  . History of DVT (deep vein thrombosis)     after 1st pregnancy  . Nasal septal perforation     chronic, rec avoid antihistamine, INS  . Seasonal allergies     allergy shots, singulair  . Thyromegaly       followed by ENT   Past Surgical History  Procedure Date  . Gastric bypass 05/21/05    Dr. Frutoso Chase, Roux-en-Y  . Partial hysterectomy 1989    ovaries remain  . Back surgery 2009  . Cholecystectomy 1998  . Appendectomy 1978  . Tonsillectomy 1969  . Nasal septum surgery 1981    deviated septum, repaired at [facility]   History  Substance Use Topics  . Smoking status: Former Smoker    Quit date: 06/16/2004  . Smokeless tobacco: Never Used   Comment: Socially-no longer  . Alcohol Use: Yes     Occasionally-wine   Family History  Problem Relation Age of Onset  . Diabetes Mother     prediabetes  . Alzheimer's disease Mother   . Hyperlipidemia Mother   . Diabetes Father   . Coronary artery disease Father     CABG  . Alcohol abuse Father   . Cancer Father     lung  . Cancer Maternal Uncle     brain  . Diabetes Paternal Grandmother   . Stroke Neg Hx    Allergies  Allergen Reactions  . Sulfa Antibiotics Shortness Of Breath  . Bee Swelling   Current Outpatient Prescriptions on File Prior to Visit  Medication Sig Dispense Refill  .  Albuterol Sulfate (VENTOLIN HFA IN) Inhale into the lungs as needed.        Marland Kitchen aspirin EC 81 MG tablet Take 1 tablet (81 mg total) by mouth daily.      . B Complex-C (SUPER B COMPLEX PO) Take 1 capsule by mouth daily.        . benazepril (LOTENSIN) 10 MG tablet Take 10 mg by mouth daily.        Marland Kitchen BLACK COHOSH PO Take by mouth as directed.        . Cholecalciferol (VITAMIN D3) 1000 UNITS tablet Take 1,000 Units by mouth daily.        . citalopram (CELEXA) 40 MG tablet Take 1 tablet (40 mg total) by mouth daily.  30 tablet  6  . Fluticasone-Salmeterol (ADVAIR DISKUS) 100-50 MCG/DOSE AEPB Inhale 1 puff into the lungs every 12 (twelve) hours.  60 each  6  . montelukast (SINGULAIR) 10 MG tablet Take 10 mg by mouth at bedtime.        . Multiple Vitamin (MULTIVITAMIN) tablet Take 1 tablet by mouth daily.        Marland Kitchen EPINEPHrine (EPI-PEN) 0.3 mg/0.3 mL DEVI  Inject 0.3 mLs (0.3 mg total) into the muscle once.  1 Device  2  . LORazepam (ATIVAN) 0.5 MG tablet Take 0.5 mg by mouth 2 (two) times daily as needed.        Review of Systems Per HPI    Objective:   Physical Exam  Nursing note and vitals reviewed. Constitutional: Claire Strong appears well-developed and well-nourished. No distress.  HENT:  Head: Normocephalic and atraumatic.  Mouth/Throat: Oropharynx is clear and moist. No oropharyngeal exudate.  Eyes: Conjunctivae and EOM are normal. Pupils are equal, round, and reactive to light. No scleral icterus.  Neck: Normal range of motion. Neck supple. Carotid bruit is not present.  Cardiovascular: Normal rate, regular rhythm, normal heart sounds and intact distal pulses.   No murmur heard. Pulmonary/Chest: Effort normal and breath sounds normal. No respiratory distress. Claire Strong has no wheezes. Claire Strong has no rales.  Abdominal: Soft. Bowel sounds are normal. Claire Strong exhibits no distension. There is no tenderness. There is no guarding.  Musculoskeletal: Claire Strong exhibits no edema.  Lymphadenopathy:    Claire Strong has no cervical adenopathy.  Skin: Skin is warm and dry. No rash noted.  Psychiatric: Claire Strong has a normal mood and affect.       Assessment & Plan:

## 2011-06-19 NOTE — Assessment & Plan Note (Signed)
Resolving.  Per ortho at [place].

## 2011-06-19 NOTE — Patient Instructions (Signed)
Return in 8 months for repeat physical, sooner fasting for blood work. Come in sooner if needed. I've refilled B12 shot. I've refilled soma. Good to see you today, call us with questions.

## 2011-06-19 NOTE — Assessment & Plan Note (Signed)
With anxiety, prior on ativan. Improving.  H/o caregiver burnout. On celexa 40mg  daily. Recommend continue this.

## 2011-06-25 ENCOUNTER — Ambulatory Visit (INDEPENDENT_AMBULATORY_CARE_PROVIDER_SITE_OTHER): Payer: BC Managed Care – PPO | Admitting: Family Medicine

## 2011-06-25 ENCOUNTER — Encounter: Payer: Self-pay | Admitting: Family Medicine

## 2011-06-25 VITALS — BP 118/82 | HR 60 | Temp 98.3°F | Wt 212.5 lb

## 2011-06-25 DIAGNOSIS — H60399 Other infective otitis externa, unspecified ear: Secondary | ICD-10-CM

## 2011-06-25 DIAGNOSIS — H6090 Unspecified otitis externa, unspecified ear: Secondary | ICD-10-CM

## 2011-06-25 MED ORDER — CIPROFLOXACIN-DEXAMETHASONE 0.3-0.1 % OT SUSP: 4.0000 [drp] | Freq: Two times a day (BID) | OTIC | Status: AC

## 2011-06-25 MED ORDER — OXYCODONE-ACETAMINOPHEN 5-325 MG PO TABS: 1.0000 | ORAL_TABLET | Freq: Three times a day (TID) | ORAL | Status: AC | PRN

## 2011-06-25 NOTE — Progress Notes (Signed)
(S) 51 y.o. female complains of pain in left ear for 3 days. No fever or URI symptoms currently but she did have URI symptoms last week. Has not been swimming but answers the phone all day and holds it to left ear.  Patient Active Problem List  Diagnoses  . Nasal septal perforation  . S/P gastric bypass  . Horseshoe kidney  . HTN (hypertension)  . Depression  . Lupus anticoagulant disorder  . Healthcare maintenance  . Neck pain  . Hot flashes  . Left knee pain  . Chest pain   Past Medical History  Diagnosis Date  . Lupus anticoagulant disorder   . Arthritis   . Depression   . HTN (hypertension)   . Kidney stones     s/p renal hematoma after lithotripsy  . Horseshoe kidney     sole, R kidney damage  . 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 Jacqulyn Ducking  . Ulcer 2009  . History of DVT (deep vein thrombosis)     after 1st pregnancy  . Nasal septal perforation     chronic, rec avoid antihistamine, INS  . Seasonal allergies     allergy shots, singulair  . Thyromegaly     followed by ENT   Past Surgical History  Procedure Date  . Gastric bypass 05/21/05    Dr. Jacqulyn Ducking, Roux-en-Y  . Partial hysterectomy 1989    ovaries remain  . Back surgery 2009  . Cholecystectomy 1998  . Appendectomy 1978  . Tonsillectomy 1969  . Nasal septum surgery 1981    deviated septum, repaired at [facility]   History  Substance Use Topics  . Smoking status: Former Smoker    Quit date: 06/16/2004  . Smokeless tobacco: Never Used   Comment: Socially-no longer  . Alcohol Use: Yes     Occasionally-wine   Family History  Problem Relation Age of Onset  . Diabetes Mother     prediabetes  . Alzheimer's disease Mother   . Hyperlipidemia Mother   . Diabetes Father   . Coronary artery disease Father     CABG  . Alcohol abuse Father   . Cancer Father     lung  . Cancer Maternal Uncle     brain  . Diabetes Paternal Grandmother   . Stroke Neg Hx    Allergies  Allergen Reactions  . Sulfa  Antibiotics Shortness Of Breath  . Bee Swelling   Current Outpatient Prescriptions on File Prior to Visit  Medication Sig Dispense Refill  . Albuterol Sulfate (VENTOLIN HFA IN) Inhale into the lungs as needed.        Marland Kitchen aspirin EC 81 MG tablet Take 1 tablet (81 mg total) by mouth daily.      . B Complex-C (SUPER B COMPLEX PO) Take 1 capsule by mouth daily.        . benazepril (LOTENSIN) 10 MG tablet Take 10 mg by mouth daily.        Marland Kitchen BLACK COHOSH PO Take by mouth as directed.        . carisoprodol (SOMA) 350 MG tablet Take 1 tablet (350 mg total) by mouth at bedtime.  60 tablet  5  . Cholecalciferol (VITAMIN D3) 1000 UNITS tablet Take 1,000 Units by mouth daily.        . citalopram (CELEXA) 40 MG tablet Take 1 tablet (40 mg total) by mouth daily.  30 tablet  6  . cyanocobalamin (,VITAMIN B-12,) 1000 MCG/ML injection Inject 1 mL (1,000 mcg  total) into the muscle every 30 (thirty) days.  10 mL  0  . EPINEPHrine (EPI-PEN) 0.3 mg/0.3 mL DEVI Inject 0.3 mLs (0.3 mg total) into the muscle once.  1 Device  2  . Fluticasone-Salmeterol (ADVAIR DISKUS) 100-50 MCG/DOSE AEPB Inhale 1 puff into the lungs every 12 (twelve) hours.  60 each  6  . LORazepam (ATIVAN) 0.5 MG tablet Take 0.5 mg by mouth 2 (two) times daily as needed.       . montelukast (SINGULAIR) 10 MG tablet Take 10 mg by mouth at bedtime.        . Multiple Vitamin (MULTIVITAMIN) tablet Take 1 tablet by mouth daily.         The PMH, PSH, Social History, Family History, Medications, and allergies have been reviewed in [facility], and have been updated if relevant.  (O) BP 118/82  Pulse 60  Temp(Src) 98.3 F (36.8 C) (Oral)  Wt 212 lb 8 oz (96.389 kg)   She appears well, afebrile. Left ear reveals tenderness of the tragus; debris and inflammation in external canal. TM is not well seen due to debris, but visualized aspects appear normal.  (A) Otitis Externa  (P) Instructed to keep ear dry until better; eardrops per orders- ciprodex, call if  persistent pain, swelling or fever, FUV prn.

## 2011-06-25 NOTE — Patient Instructions (Signed)
Nice to meet you.  Please use ear drops as directed for 7 days.  Otitis Externa Otitis externa ("swimmer's ear") is a germ (bacterial) or fungal infection of the outer ear canal (from the eardrum to the outside of the ear). Swimming in dirty water may cause swimmer's ear. It also may be caused by moisture in the ear from water remaining after swimming or bathing. Often the first signs of infection may be itching in the ear canal. This may progress to ear canal swelling, redness, and pus drainage, which may be signs of infection. HOME CARE INSTRUCTIONS   Apply the antibiotic drops to the ear canal as prescribed by your doctor.   This can be a very painful medical condition. A strong pain reliever may be prescribed.   Only take over-the-counter or prescription medicines for pain, discomfort, or fever as directed by your caregiver.   If your caregiver has given you a follow-up appointment, it is very important to keep that appointment. Not keeping the appointment could result in a chronic or permanent injury, pain, hearing loss and disability. If there is any problem keeping the appointment, you must call back to this facility for assistance.  PREVENTION   It is important to keep your ear dry. Use the corner of a towel to wick water out of the ear canal after swimming or bathing.   Avoid scratching in your ear. This can damage the ear canal or remove the protective wax lining the canal and make it easier for germs (bacteria) or a fungus to grow.   You may use ear drops made of rubbing alcohol and vinegar after swimming to prevent future "swimmer's ear" infections. Make up a small bottle of equal parts white vinegar and alcohol. Put 3 or 4 drops into each ear after swimming.   Avoid swimming in lakes, polluted water, or poorly chlorinated pools.  SEEK MEDICAL CARE IF:   An oral temperature above 102 F (38.9 C) develops.   Your ear is still painful after 3 days and shows signs of getting  worse (redness, swelling, pain, or pus).  MAKE SURE YOU:   Understand these instructions.   Will watch your condition.   Will get help right away if you are not doing well or get worse.  Document Released: 06/02/2005 Document Revised: 02/12/2011 Document Reviewed: 01/07/2008 [facility] Patient Information 2012 [place], [place].

## 2011-07-08 ENCOUNTER — Encounter: Payer: Self-pay | Admitting: Family Medicine

## 2011-07-08 ENCOUNTER — Ambulatory Visit (INDEPENDENT_AMBULATORY_CARE_PROVIDER_SITE_OTHER): Payer: BC Managed Care – PPO | Admitting: Family Medicine

## 2011-07-08 ENCOUNTER — Encounter: Payer: Self-pay | Admitting: *Deleted

## 2011-07-08 DIAGNOSIS — J019 Acute sinusitis, unspecified: Secondary | ICD-10-CM | POA: Insufficient documentation

## 2011-07-08 DIAGNOSIS — J45909 Unspecified asthma, uncomplicated: Secondary | ICD-10-CM | POA: Insufficient documentation

## 2011-07-08 MED ORDER — HYDROCOD POLST-CHLORPHEN POLST 10-8 MG/5ML PO LQCR: 5.0000 mL | Freq: Every evening | ORAL | Status: DC | PRN

## 2011-07-08 MED ORDER — AMOXICILLIN-POT CLAVULANATE 875-125 MG PO TABS: 1.0000 | ORAL_TABLET | Freq: Two times a day (BID) | ORAL | Status: AC

## 2011-07-08 NOTE — Assessment & Plan Note (Signed)
Will treat as bacterial sinusitis given progression of sxs and duration. Augmentin. cheratussin not helping cough, increase to tussionex. Update [place] if not improving as expected.

## 2011-07-08 NOTE — Patient Instructions (Signed)
You have a sinus infection. Take medicine as prescribed: augmentin and tussionex for cough Push fluids and plenty of rest. Nasal saline irrigation or neti pot to help drain sinuses. May use simple mucinex with plenty of fluid to help mobilize mucous. Let us know if fever >101.5, trouble opening/closing mouth, difficulty swallowing, or worsening - you may need to be seen again.

## 2011-07-08 NOTE — Progress Notes (Signed)
  Subjective:    Patient ID: Claire Strong, female    DOB: [birth year], 51 y.o.   MRN: [id number]  HPI CC: cough, sinus pain  Seen 06/25/2011 with swimmer's ear, treated with ear drops (ciprodex) and improved from that standpoint.  2 wk h/o not feeling well.  Started with swimmer's ear, has been progressive.  Chest sore from coughing.  + SOB with cough.  Cough dry, hacking.  + nasal mucous purulent.  + sinus pain/pressure.  Last week bad HA but improved last 2 days.  No fevers/chills, ST, abd pain, n/v, new rashes, tooth pain.  No sick contacts at home.  No smokers at home.  H/o bronchial asthma.  Codeine and advair not helping with coughing at night  Flu shot received this year.  Requests doctor's note for today.  Review of Systems Per HPI    Objective:   Physical Exam  Nursing note and vitals reviewed. Constitutional: She appears well-developed and well-nourished. No distress.  HENT:  Head: Normocephalic and atraumatic.  Right Ear: Hearing, tympanic membrane, external ear and ear canal normal.  Left Ear: Hearing, tympanic membrane, external ear and ear canal normal.  Nose: No mucosal edema or rhinorrhea. Right sinus exhibits maxillary sinus tenderness and frontal sinus tenderness. Left sinus exhibits maxillary sinus tenderness and frontal sinus tenderness.  Mouth/Throat: Uvula is midline and mucous membranes are normal. Posterior oropharyngeal erythema present. No oropharyngeal exudate, posterior oropharyngeal edema or tonsillar abscesses.       Evidently congested  Eyes: Conjunctivae and EOM are normal. Pupils are equal, round, and reactive to light. No scleral icterus.  Neck: Normal range of motion. Neck supple.  Cardiovascular: Normal rate, regular rhythm, normal heart sounds and intact distal pulses.   No murmur heard. Pulmonary/Chest: Effort normal and breath sounds normal. No respiratory distress. She has no wheezes. She has no rales.       Lungs clear  Lymphadenopathy:   She has no cervical adenopathy.  Skin: Skin is warm and dry. No rash noted.      Assessment & Plan:

## 2011-07-09 ENCOUNTER — Telehealth: Payer: Self-pay | Admitting: Family Medicine

## 2011-07-09 MED ORDER — FLUCONAZOLE 150 MG PO TABS: 150.0000 mg | ORAL_TABLET | Freq: Once | ORAL | Status: AC

## 2011-07-09 NOTE — Telephone Encounter (Signed)
Sent in.  plz notify.

## 2011-07-09 NOTE — Telephone Encounter (Signed)
Patient called and asked if a Rx for Diflucan can be called in to her Pharmacy because she was prescribed an antibiotic yesterday.  Patient uses [facility] in [place].

## 2011-07-09 NOTE — Telephone Encounter (Signed)
Patient notified

## 2011-07-10 ENCOUNTER — Telehealth: Payer: Self-pay | Admitting: Family Medicine

## 2011-07-10 MED ORDER — NYSTATIN 100000 UNIT/ML MT SUSP: 500000.0000 [IU] | Freq: Four times a day (QID) | OROMUCOSAL | Status: AC

## 2011-07-10 NOTE — Telephone Encounter (Signed)
Recommend time advair prior to brushing teeth.  Also likely related some to recent abx use. Albuterol shouldn't cause this. Diflucan will help with this, but will also send in nystatin swish and swallow to use QID for 5-7 days or until better.  If not improving to let us know.

## 2011-07-10 NOTE — Telephone Encounter (Signed)
Patient was seen on Tuesday.  Patient has been using Advair Inhaler and emergency inhaler.  Patient said her tongue and jaws are very sensitive to cold and hot and her tongue and jaw are burning.  Patient showed it to one of the doctor's at her office and she said it was thrush.  Patient uses Adult nurse in [place].

## 2011-07-10 NOTE — Telephone Encounter (Signed)
Patient notified. She will call back if no improvement.

## 2011-07-30 ENCOUNTER — Ambulatory Visit: Payer: Self-pay | Admitting: Otolaryngology

## 2011-07-30 LAB — T4, FREE: Free Thyroxine: 0.94 ng/dL (ref 0.76–1.46)

## 2011-07-30 LAB — TSH: Thyroid Stimulating Horm: 1.27 u[IU]/mL

## 2011-08-01 ENCOUNTER — Ambulatory Visit: Payer: Self-pay | Admitting: Urology

## 2011-09-10 ENCOUNTER — Ambulatory Visit: Payer: Self-pay | Admitting: Anesthesiology

## 2011-09-10 ENCOUNTER — Encounter: Payer: Self-pay | Admitting: Family Medicine

## 2011-09-10 DIAGNOSIS — I499 Cardiac arrhythmia, unspecified: Secondary | ICD-10-CM

## 2011-09-10 LAB — CBC WITH DIFFERENTIAL/PLATELET
Basophil #: 0 10*3/uL (ref 0.0–0.1)
HCT: 40.1 % (ref 35.0–47.0)
HGB: 13.6 g/dL (ref 12.0–16.0)
Lymphocyte #: 1.4 10*3/uL (ref 1.0–3.6)
MCH: 31 pg (ref 26.0–34.0)
Monocyte #: 0.3 10*3/uL (ref 0.0–0.7)
Monocyte %: 7.1 %
Neutrophil #: 2.1 10*3/uL (ref 1.4–6.5)
Platelet: 193 10*3/uL (ref 150–440)
RBC: 4.4 10*6/uL (ref 3.80–5.20)
RDW: 11.6 % (ref 11.5–14.5)
WBC: 3.9 10*3/uL (ref 3.6–11.0)

## 2011-09-10 LAB — BASIC METABOLIC PANEL
Anion Gap: 9 (ref 7–16)
BUN: 11 mg/dL (ref 7–18)
Calcium, Total: 8.1 mg/dL — ABNORMAL LOW (ref 8.5–10.1)
Chloride: 106 mmol/L (ref 98–107)
Co2: 27 mmol/L (ref 21–32)
Creatinine: 0.83 mg/dL (ref 0.60–1.30)
EGFR (African American): >60
Glucose: 82 mg/dL (ref 65–99)
Potassium: 4 mmol/L (ref 3.5–5.1)

## 2011-09-15 ENCOUNTER — Ambulatory Visit: Payer: Self-pay | Admitting: Otolaryngology

## 2011-09-15 HISTORY — PX: TOTAL THYROIDECTOMY: SHX2547

## 2011-09-16 LAB — CALCIUM: Calcium, Total: 7.7 mg/dL — ABNORMAL LOW (ref 8.5–10.1)

## 2011-09-18 ENCOUNTER — Other Ambulatory Visit: Payer: Self-pay

## 2011-09-18 MED ORDER — ALBUTEROL SULFATE HFA 108 (90 BASE) MCG/ACT IN AERS: 2.0000 | INHALATION_SPRAY | Freq: Four times a day (QID) | RESPIRATORY_TRACT | Status: DC | PRN

## 2011-09-18 NOTE — Telephone Encounter (Signed)
plz send in with sig: 2 puffs q6 hours prn wheezing/sob.  RF: 11.

## 2011-09-18 NOTE — Telephone Encounter (Signed)
Rx sent in as directed.

## 2011-09-18 NOTE — Telephone Encounter (Signed)
[facility] [place] faxed request Ventolin HFA. Med list instructions are inhale into lungs as needed. [facility] instructions are inhale one to two puffs every 6 hours. Pt last seen 07/08/11.Please advise.

## 2011-09-20 ENCOUNTER — Emergency Department: Payer: Self-pay | Admitting: Emergency Medicine

## 2011-09-20 LAB — CBC
HCT: 39 % (ref 35.0–47.0)
HGB: 13.3 g/dL (ref 12.0–16.0)
MCHC: 34.1 g/dL (ref 32.0–36.0)
WBC: 4.7
WBC: 4.7 10*3/uL (ref 3.6–11.0)

## 2011-09-20 LAB — COMPREHENSIVE METABOLIC PANEL
AST: 41 U/L
Albumin: 3.5
Anion Gap: 11 (ref 7–16)
BUN: 13 mg/dL (ref 4–21)
BUN: 13 mg/dL (ref 7–18)
Calcium, Total: 7.7 mg/dL — ABNORMAL LOW (ref 8.5–10.1)
Calcium: 7.7 mg/dL
Chloride: 102 mmol/L (ref 98–107)
Creat: 1.02
EGFR (African American): >60
EGFR (Non-African Amer.): >60
Glucose: 104
Glucose: 104 mg/dL — ABNORMAL HIGH (ref 65–99)
Osmolality: 271 (ref 275–301)
Sodium: 135 mmol/L — AB (ref 137–147)
Sodium: 135 mmol/L — ABNORMAL LOW (ref 136–145)
Total Protein: 6.4 g/dL (ref 6.4–8.2)

## 2011-09-20 LAB — TSH
Free T4: 0.91
TSH: 2.04 u[IU]/mL (ref 0.41–5.90)
Thyroid Stimulating Horm: 2.04 u[IU]/mL

## 2011-09-20 LAB — T4, FREE: Free Thyroxine: 0.91 ng/dL (ref 0.76–1.46)

## 2011-09-24 ENCOUNTER — Telehealth: Payer: Self-pay | Admitting: Family Medicine

## 2011-09-24 NOTE — Telephone Encounter (Signed)
Caller: Nakeia/Patient; PCP: Eustaquio Boyden; CB#: [phone]; ; ; Call regarding ED Follow Up; states had thyroidectomy 09/15/11.  Seen in ED/[place] 09/20/11 for severe sudden headache/migraine, HR 189, BP 200/120, given nitroglycerin, and MgSO4 drip.  Sent home after 12 hours of treatment.  CT negative.  Synthroid started 09/21/11.  Still c/o headache.  BP currently 142/86, HR 78.   Headache has not resolved x > 3 days.  Per protocol, emergent symptoms currently denied; advised appt within 24 hours.  Appt sched 0915 09/25/11 with Dr. Sharen Hones.

## 2011-09-24 NOTE — Telephone Encounter (Signed)
Can we try and get records from [facility]?  Thanks.

## 2011-09-24 NOTE — Telephone Encounter (Signed)
Noted thanks °

## 2011-09-25 ENCOUNTER — Telehealth: Payer: Self-pay | Admitting: Family Medicine

## 2011-09-25 ENCOUNTER — Ambulatory Visit (INDEPENDENT_AMBULATORY_CARE_PROVIDER_SITE_OTHER): Payer: BC Managed Care – PPO | Admitting: Family Medicine

## 2011-09-25 ENCOUNTER — Encounter: Payer: Self-pay | Admitting: Family Medicine

## 2011-09-25 VITALS — BP 130/90 | HR 100 | Temp 98.4°F | Wt 212.0 lb

## 2011-09-25 DIAGNOSIS — R519 Headache, unspecified: Secondary | ICD-10-CM | POA: Insufficient documentation

## 2011-09-25 DIAGNOSIS — R002 Palpitations: Secondary | ICD-10-CM

## 2011-09-25 DIAGNOSIS — R51 Headache: Secondary | ICD-10-CM

## 2011-09-25 NOTE — Telephone Encounter (Signed)
Triage Record Num: [id number] Operator: Chevis Pretty Patient Name: Claire Strong Call Date & Time: 09/24/2011 2:07:29PM Patient Phone: [phone] PCP: Eustaquio Boyden Patient Gender: Female PCP Fax : [phone] Patient DOB: [birth year] Practice Name: [facility] Reason for Call: Caller: Chaniah/Patient; PCP: Eustaquio Boyden; CB#: [phone]; ; ; Call regarding ED Follow Up; states had thyroidectomy 09/15/11. Seen in ED/[place] 09/20/11 for severe sudden headache/migraine, HR 189, BP 200/120, given nitroglycerin, and MgSO4 drip. Sent home after 12 hours of treatment. CT negative. Synthroid started 09/21/11. Still c/o headache. BP currently 142/86, HR 78. Headache has not resolved x > 3 days. Per protocol, emergent symptoms currently denied; advised appt within 24 hours. Appt sched 0915 09/25/11 with Dr. Sharen Hones. Protocol(s) Used: Headache Recommended Outcome per Protocol: See Provider within 24 hours Reason for Outcome: Constant headache AND unrelieved with nonprescription medications Care Advice: ~ Do not drink alcoholic beverages or use tobacco products. ~ Do not take aspirin for headache until discussing with your provider. Call EMS 911 immediately if any of the following occur: any loss of consciousness; new confusion, drowsiness or agitation; difficulty speaking; new weakness or paralysis, severe numbness, or difficulty moving. ~ ~ SYMPTOM / CONDITION MANAGEMENT ~ CAUTIONS ~ Call provider immediately if headache becomes more severe. Most adults need to drink 6-10 eight-ounce glasses (1.2-2.0 liters) of fluids per day unless previously told to limit fluid intake for other medical reasons. Limit fluids that contain caffeine, sugar or alcohol. Urine will be a very light yellow color when you drink enough fluids. ~ Analgesic/Antipyretic Advice - Acetaminophen: Consider acetaminophen as directed on label or by pharmacist/provider for pain or fever PRECAUTIONS: -  Use if there is no history of liver disease, alcoholism, or intake of three or more alcohol drinks per day - Only if approved by provider during pregnancy or when breastfeeding - During pregnancy, acetaminophen should not be taken more than 3 consecutive days without telling provider - Do not exceed recommended dose or frequency ~ Analgesic/Antipyretic Advice - NSAIDs: Consider aspirin, ibuprofen, naproxen or ketoprofen for pain or fever as directed on label or by pharmacist/provider. PRECAUTIONS: - If over 7 years of age, should not take longer than 1 week without consulting provider. EXCEPTIONS: - Should not be used if taking blood thinners or have bleeding problems. - Do not use if have history of sensitivity/allergy to any of these medications; or history of cardiovascular, ulcer, kidney, liver disease or diabetes unless approved by provider. - Do not exceed recommended dose or frequency. ~ Call EMS 911 if having a sudden, severe disabling headache OR if the person spontaneously verbalized this headache is "the worst headache of my life." ~ 09/24/2011 2:22:37PM Page 1 of 1 [id number]

## 2011-09-25 NOTE — Assessment & Plan Note (Addendum)
No dx migraines but I wonder if new onset vs TTH as noted cervical muscle discomfort with palpation. rec increase soma for next several days and into weekend, to see if we can achieve resolution of headaches. Head CT normal at [facility], no bleed (report reviewed and asked to scan). Per ER records pt declined LP.  Neurological exam normal today. Normal thyroid functions at ER. Return in 1 wk for f/u, sooner if worsening. Noted hypocalcemia on blood work per pt repleted.  Will recheck Ca levels next visit.

## 2011-09-25 NOTE — Telephone Encounter (Signed)
Medical records from [facility] in your IN box.

## 2011-09-25 NOTE — Telephone Encounter (Signed)
Noted. Duplicate from prior note.

## 2011-09-25 NOTE — Progress Notes (Signed)
Subjective:    Patient ID: Claire Strong, female    DOB: April 23, 1961, 51 y.o.   MRN: [id number]  HPI CC: [facility] ER f/u  Complete thyroidectomy 09/15/2011.  Had extra bleeding and stayed an extra day in hospital.  Started on levothyroxine, has f/u with ENT for recheck.  Saturday went to [employer] - slight car bump in parking lot.  Alphia got out and at that time had a severe headache.  EMS called and per pt HR 190, BP 200/120.  Seen at [facility] ER on 09/21/2011 - records reviewed:  EKG - NSR with PACs, CBC WNL, Cr 1.02, Ca 7.7, Alb 3.5, AST 41, ALT 68, TSH 2.04, free T4 0.91, head CT WNL.  Also treated with IV reglan, dilaudid and magnesium.  (pt endorses given nitro but this is not in chart).  Did feel better so went home that same day.  Also given zofran and fioricet.    When arrived at home, had nausea/vomiting which improved over last few days.  Over last few days has been taking tylenol, no longer fioricet.  Feeling some sharp pains on right side of neck.  Hasn't taken any meds today so far.  Now has constant dull headache present.  Waking up in mornings with headache, but getting better.  Endorses some lightheadedness.  Has had some palpitations associated with chest tightness.  photophonophobia with headache, some nausea.  Pain posterior of neck as well as bilateral heads.  Takes soma nightly.  No dx migraines in past but daughters with positive history.  No current fevers/chills, double vision, LOC, vertigo.  No facial droop, slurred speech, unilateral weakness, tingling/numbness, AMS, confusion.  Never chest pain.  Has not seen cardiologist in past.  Wt Readings from Last 3 Encounters:  09/25/11 212 lb (96.163 kg)  07/08/11 217 lb 12 oz (98.771 kg)  06/25/11 212 lb 8 oz (96.389 kg)     Past Medical History  Diagnosis Date  . Lupus anticoagulant disorder   . Arthritis   . Depression   . HTN (hypertension)   . Kidney stones     s/p renal hematoma after lithotripsy  . Horseshoe kidney    sole, R kidney damage  . 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 Jacqulyn Ducking  . Ulcer 2009  . History of DVT (deep vein thrombosis)     after 1st pregnancy  . Nasal septal perforation     chronic, ENT rec avoid antihistamine, INS  . Seasonal allergies     allergy shots, singulair  . Multinodular goiter     s/p thyroidectomy  . Bronchial asthma   . Post-surgical hypothyroidism     multinodular goiter    Review of Systems Per HPI    Objective:   Physical Exam  Nursing note and vitals reviewed. Constitutional: She is oriented to person, place, and time. She appears well-developed and well-nourished. No distress.  HENT:  Head: Normocephalic and atraumatic.  Mouth/Throat: Oropharynx is clear and moist. No oropharyngeal exudate.  Eyes: Conjunctivae and EOM are normal. Pupils are equal, round, and reactive to light. No scleral icterus.  Neck: Normal range of motion. Neck supple.       Midline neck incision, no erythema or induration  Cardiovascular: Normal rate, regular rhythm, normal heart sounds and intact distal pulses.   No murmur heard.      Regular pulse  Pulmonary/Chest: Effort normal and breath sounds normal. No respiratory distress. She has no wheezes. She has no rales.  Musculoskeletal: Normal range of  motion. She exhibits no edema.       FROM at neck. Mild tenderness to palpation trapezius muscles but no outright spasm  Lymphadenopathy:    She has no cervical adenopathy.  Neurological: She is alert and oriented to person, place, and time. She has normal strength. No cranial nerve deficit or sensory deficit. She displays a negative Romberg sign. Coordination and gait normal.       CN 2-12 intact. Normal finger to nose. Negative pronator drift No nystagmus.  Skin: Skin is warm and dry. No rash noted.  Psychiatric: She has a normal mood and affect.       Assessment & Plan:

## 2011-09-25 NOTE — Telephone Encounter (Signed)
Sent fax requesting medical records from [facility].

## 2011-09-25 NOTE — Patient Instructions (Signed)
Increase soma to 2-3 times daily for next several days to see if we can completely get rid of headache. Return in 1 week for follow up. Let me know sooner if worsening.

## 2011-09-25 NOTE — Assessment & Plan Note (Signed)
Endorsed tachycardia with palpitations and lightheadedness upon ER evaluation, but only see EKG with NSR at rate 65.  Some PACs and mild QT prolongation (?due to hypocalcemia). Thyroid normal at ER eval. Today regular and no lightheadedness. Return in 1 wk, if continued, refer to cards for likely holter monitor.

## 2011-10-02 ENCOUNTER — Ambulatory Visit: Payer: BC Managed Care – PPO | Admitting: Family Medicine

## 2011-10-03 ENCOUNTER — Ambulatory Visit (INDEPENDENT_AMBULATORY_CARE_PROVIDER_SITE_OTHER): Payer: BC Managed Care – PPO | Admitting: Family Medicine

## 2011-10-03 ENCOUNTER — Encounter: Payer: Self-pay | Admitting: Family Medicine

## 2011-10-03 VITALS — BP 120/70 | HR 63 | Temp 100.5°F | Ht 69.0 in | Wt 215.0 lb

## 2011-10-03 DIAGNOSIS — R002 Palpitations: Secondary | ICD-10-CM

## 2011-10-03 DIAGNOSIS — R0989 Other specified symptoms and signs involving the circulatory and respiratory systems: Secondary | ICD-10-CM

## 2011-10-03 DIAGNOSIS — R51 Headache: Secondary | ICD-10-CM

## 2011-10-03 LAB — CBC WITH DIFFERENTIAL/PLATELET
Basophils Absolute: 0 10*3/uL (ref 0.0–0.1)
Eosinophils Absolute: 0.3 10*3/uL (ref 0.0–0.7)
Eosinophils Relative: 4.7 % (ref 0.0–5.0)
HCT: 39.9 % (ref 36.0–46.0)
Lymphs Abs: 1.8 10*3/uL (ref 0.7–4.0)
MCV: 92.9 fl (ref 78.0–100.0)
Monocytes Absolute: 0.3 10*3/uL (ref 0.1–1.0)
Neutrophils Relative %: 56.2 % (ref 43.0–77.0)
Platelets: 177 10*3/uL (ref 150.0–400.0)
RDW: 12.5 % (ref 11.5–14.6)
WBC: 5.4 10*3/uL (ref 4.5–10.5)

## 2011-10-03 LAB — T3, FREE: T3, Free: 2.3 pg/mL (ref 2.3–4.2)

## 2011-10-03 LAB — TSH: TSH: 2.16 u[IU]/mL (ref 0.35–5.50)

## 2011-10-03 MED ORDER — METOPROLOL TARTRATE 25 MG PO TABS: 25.0000 mg | ORAL_TABLET | ORAL | Status: DC | PRN

## 2011-10-03 NOTE — Progress Notes (Signed)
Subjective:    Patient ID: Claire Strong, female    DOB: 1960-11-03, 51 y.o.   MRN: [id number]  HPI CC: 1 wk f/u  Pleasant 68 yo wife of Delorse Shane with h/o lupus anticoagulant with remote DVT, HTN, horseshoe kidney, asthma and multinodular goiter s/p recent thyroidectomy with post surgical hypothyroidism, also s/p roux en y gastric bypass, presents for 1 wk f/u of hypertensive/palpitation episode associated with severe HA for which se seeked care at [facility] ER.  See prior note for further details.  Continued palpitations.  Monday had light episode of heart racing.  Then Wednesday had 1 hour episode where started feeling lightheaded, then nauseated, then heart racing.  Also had left sided chest discomfort from rapid heart beat.  Tried to take pulse, states too fast to count.  Staying fatigued.  Still with some headache.  More stress at work recently, catching up after time out for thyroid surgery.  No recent ST, cough, diarrhea, dysuria.  Not more congested than normal.  No orthopnea, PNDyspnea, leg swelling, dyspnea.  Residual hoarseness from surgery.  Increased albuterol use recently.  Taking advair regularly.  Father with CAD and CABG at [facility], was smoker.  Wt Readings from Last 3 Encounters:  10/03/11 215 lb (97.523 kg)  09/25/11 212 lb (96.163 kg)  07/08/11 217 lb 12 oz (98.771 kg)    Past Medical History  Diagnosis Date  . Lupus anticoagulant disorder   . Arthritis   . Depression   . HTN (hypertension)   . Kidney stones     s/p renal hematoma after lithotripsy  . Horseshoe kidney     sole, R kidney damage  . 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 Jacqulyn Ducking  . Ulcer 2009  . History of DVT (deep vein thrombosis)     after 1st pregnancy  . Nasal septal perforation     chronic, ENT rec avoid antihistamine, INS  . Seasonal allergies     allergy shots, singulair  . Multinodular goiter     s/p thyroidectomy  . Bronchial asthma   . Post-surgical hypothyroidism    multinodular goiter   Past Surgical History  Procedure Date  . Gastric bypass 05/21/05    Dr. Jacqulyn Ducking, Roux-en-Y  . Partial hysterectomy 1989    ovaries remain  . Back surgery 2009  . Cholecystectomy 1998  . Appendectomy 1978  . Tonsillectomy 1969  . Nasal septum surgery 1981    deviated septum, repaired at [facility]  . Total thyroidectomy 09/2011    benign path (done for multinodular goiter concern for cancer)    Family History  Problem Relation Age of Onset  . Diabetes Mother     prediabetes  . Alzheimer's disease Mother   . Hyperlipidemia Mother   . Diabetes Father   . Coronary artery disease Father     CABG  . Alcohol abuse Father   . Cancer Father     lung  . Cancer Maternal Uncle     brain  . Diabetes Paternal Grandmother   . Stroke Neg Hx     Review of Systems Per HPI    Objective:   Physical Exam  Nursing note and vitals reviewed. Constitutional: She appears well-developed and well-nourished. No distress.  HENT:  Head: Normocephalic and atraumatic.  Mouth/Throat: Oropharynx is clear and moist. No oropharyngeal exudate.  Eyes: Conjunctivae and EOM are normal. Pupils are equal, round, and reactive to light. No scleral icterus.  Neck: Normal range of motion. Neck supple. JVD (mild) present.  Carotid bruit is not present. No thyromegaly present.  Cardiovascular: Normal rate, regular rhythm, normal heart sounds and intact distal pulses.   No murmur heard. Pulmonary/Chest: Effort normal and breath sounds normal. No respiratory distress. She has no wheezes. She has no rales.  Abdominal: Soft. Bowel sounds are normal. She exhibits no distension. There is no tenderness. There is no rebound and no guarding.       No abd/renal bruit  Musculoskeletal: She exhibits no edema.       No pitting edema.  + nonpitting edema  Lymphadenopathy:    She has no cervical adenopathy.  Skin: Skin is warm and dry. No rash noted.  Psychiatric: She has a normal mood and affect.        Assessment & Plan:

## 2011-10-03 NOTE — Patient Instructions (Signed)
Blood work today. Use metoprolol as needed for heart beats. Pass by [name]'s office to schedule cardiology referral.

## 2011-10-05 ENCOUNTER — Encounter: Payer: Self-pay | Admitting: Family Medicine

## 2011-10-05 NOTE — Assessment & Plan Note (Addendum)
At ER eval: EKG - NSR with PACs slight QT prolongation, CBC WNL, Cr 1.02, Ca 7.7, Alb 3.5, AST 41, ALT 68, TSH 2.04, free T4 0.91, head CT WNL. Also treated with IV reglan, dilaudid and magnesium. (pt endorses given nitro but this is not in chart).  As pt endorses continued palpitation episodes, refer to cards for further evaluation of palpitations. Blood work today -recheck Ca. Denies skipped beats.  ?SVT.  Provided with metoprolol to use PRN palpitations while gets in to see cards, advised if prolonged episode really needs to be evaluated immediately. TSH checked at [facility] last week normal, recheck today. Increased stress recently but pt denies any real anxiety issue at play.

## 2011-10-05 NOTE — Assessment & Plan Note (Signed)
Last visit thought TTH vs new presentation of migraines. Overall improving. Continue to monitor.

## 2011-10-17 ENCOUNTER — Ambulatory Visit (INDEPENDENT_AMBULATORY_CARE_PROVIDER_SITE_OTHER): Payer: BC Managed Care – PPO | Admitting: Cardiovascular Disease

## 2011-10-17 ENCOUNTER — Ambulatory Visit: Payer: Self-pay | Admitting: Otolaryngology

## 2011-10-17 ENCOUNTER — Encounter: Payer: Self-pay | Admitting: Cardiovascular Disease

## 2011-10-17 DIAGNOSIS — R079 Chest pain, unspecified: Secondary | ICD-10-CM

## 2011-10-17 DIAGNOSIS — R0602 Shortness of breath: Secondary | ICD-10-CM

## 2011-10-17 DIAGNOSIS — I10 Essential (primary) hypertension: Secondary | ICD-10-CM

## 2011-10-17 DIAGNOSIS — R002 Palpitations: Secondary | ICD-10-CM

## 2011-10-17 LAB — TSH: Thyroid Stimulating Horm: 5.15 u[IU]/mL — ABNORMAL HIGH

## 2011-10-17 LAB — T4, FREE: Free Thyroxine: 0.93 ng/dL (ref 0.76–1.46)

## 2011-10-17 MED ORDER — PROPRANOLOL HCL 10 MG PO TABS: 10.0000 mg | ORAL_TABLET | Freq: Three times a day (TID) | ORAL | Status: DC | PRN

## 2011-10-17 NOTE — Assessment & Plan Note (Signed)
We have ordered a Holter at her request. She has family that works at [employer] and they will arrange this for her. We did suggest if she continues to have symptoms, she may benefit from a 30 day monitor. This could be done if her Holter monitor does not show significant arrhythmia.   She does feel fatigued on her metoprolol. We have suggested she could try propranolol when necessary. Short acting beta blocker may benefit her more.

## 2011-10-17 NOTE — Assessment & Plan Note (Signed)
Blood pressure is well controlled on today's visit. No changes made to the medications. 

## 2011-10-17 NOTE — Progress Notes (Signed)
Patient ID: Claire Strong, female    DOB: 06/15/1961, 51 y.o.   MRN: [id number]  HPI Comments: 51 year old woman with h/o lupus anticoagulant with remote DVT, HTN, horseshoe kidney, asthma and multinodular goiter s/p recent thyroidectomy,  s/p roux en y gastric bypass,  post surgical hypertensive/palpitation episode associated with severe HA seen at [facility] ER, who presents for evaluation.  She reports that approximately 2 weeks ago, she had an episode of tachycardia. She has had other episodes of palpitations. She takes metoprolol when necessary but this makes her feel tired. She has some chest tightness all the time since the surgery. She wonders if this could be secondary to healing. Some of the tachycardia episodes were too fast to measure. Usually they do not last for prolonged periods of time, sometimes several minutes. The worst episode was approximately one hour. She has had some headaches, one severe headache following her surgery requiring evaluation in the emergency room.  she is currently back at work.  Father with CAD and CABG at [facility], was smoker.  EKG shows normal sinus rhythm with rate 66 beats per minute with no significant ST or T wave changes  She reports that she is on benazepril for previous history of flushing       Outpatient Encounter Prescriptions as of 10/17/2011  Medication Sig Dispense Refill  . ACETAMINOPHEN-BUTALBITAL 50-325 MG TABS Take 1 tablet by mouth every 4 (four) hours.      Marland Kitchen albuterol (PROVENTIL HFA;VENTOLIN HFA) 108 (90 BASE) MCG/ACT inhaler Inhale 2 puffs into the lungs every 6 (six) hours as needed.  1 Inhaler  11  . aspirin EC 81 MG tablet Take 1 tablet (81 mg total) by mouth daily.      . B Complex-C (SUPER B COMPLEX PO) Take 1 capsule by mouth daily.        . benazepril (LOTENSIN) 10 MG tablet Take 10 mg by mouth daily.        . calcium carbonate (OS-CAL) 600 MG TABS Take 600 mg by mouth daily.      . carisoprodol (SOMA) 350 MG tablet Take 1 tablet (350  mg total) by mouth at bedtime.  60 tablet  5  . Cholecalciferol (VITAMIN D3) 1000 UNITS tablet Take 2,000 Units by mouth daily.       . citalopram (CELEXA) 20 MG tablet Take 20 mg by mouth daily.      . cyanocobalamin (,VITAMIN B-12,) 1000 MCG/ML injection Inject 1 mL (1,000 mcg total) into the muscle every 30 (thirty) days.  10 mL  0  . EPINEPHrine (EPI-PEN) 0.3 mg/0.3 mL DEVI Inject 0.3 mLs (0.3 mg total) into the muscle once.  1 Device  2  . Fluticasone-Salmeterol (ADVAIR DISKUS) 100-50 MCG/DOSE AEPB Inhale 1 puff into the lungs every 12 (twelve) hours.  60 each  6  . levothyroxine (SYNTHROID, LEVOTHROID) 125 MCG tablet Take 125 mcg by mouth daily.      Marland Kitchen loratadine (CLARITIN) 10 MG tablet Take 10 mg by mouth daily.      . Magnesium 250 MG TABS Take 1 tablet by mouth daily.      . metoprolol tartrate (LOPRESSOR) 25 MG tablet Take 1 tablet (25 mg total) by mouth as needed.  30 tablet  1  . montelukast (SINGULAIR) 10 MG tablet Take 10 mg by mouth at bedtime.        . Multiple Vitamin (MULTIVITAMIN) tablet Take 1 tablet by mouth daily.         Review of  Systems  Constitutional: Negative.   HENT: Negative.   Eyes: Negative.   Respiratory: Negative.   Cardiovascular: Positive for chest pain and palpitations.  Gastrointestinal: Negative.   Musculoskeletal: Negative.   Skin: Negative.   Neurological: Negative.   Hematological: Negative.   Psychiatric/Behavioral: Negative.   All other systems reviewed and are negative.    BP 114/78  Pulse 66  Ht 5\' 10"  (1.778 m)  Wt 218 lb 4 oz (98.998 kg)  BMI 31.32 kg/m2  Physical Exam  Nursing note and vitals reviewed. Constitutional: She is oriented to person, place, and time. She appears well-developed and well-nourished.  HENT:  Head: Normocephalic.  Nose: Nose normal.  Mouth/Throat: Oropharynx is clear and moist.  Eyes: Conjunctivae are normal. Pupils are equal, round, and reactive to light.  Neck: Normal range of motion. Neck supple. No  JVD present.       Well healed surgical incision site from thyroidectomy  Cardiovascular: Normal rate, regular rhythm, S1 normal, S2 normal, normal heart sounds and intact distal pulses.  Exam reveals no gallop and no friction rub.   No murmur heard. Pulmonary/Chest: Effort normal and breath sounds normal. No respiratory distress. She has no wheezes. She has no rales. She exhibits no tenderness.  Abdominal: Soft. Bowel sounds are normal. She exhibits no distension. There is no tenderness.  Musculoskeletal: Normal range of motion. She exhibits no edema and no tenderness.  Lymphadenopathy:    She has no cervical adenopathy.  Neurological: She is alert and oriented to person, place, and time. Coordination normal.  Skin: Skin is warm and dry. No rash noted. No erythema.  Psychiatric: She has a normal mood and affect. Her behavior is normal. Judgment and thought content normal.         Assessment and Plan

## 2011-10-17 NOTE — Patient Instructions (Signed)
You are doing well. Please take propranolol as needed for tachycardia or palpitations  Please call us if you have new issues that need to be addressed before your next appt.

## 2011-10-17 NOTE — Assessment & Plan Note (Signed)
She has vague chest tightness since her surgery. Atypical in nature, likely postsurgical. We did suggest if she would like she could have an echocardiogram to evaluate cardiac function. She has no significant risk factors for coronary artery disease. We will start with a monitor first. If she continues to have symptoms, echocardiogram could be ordered at a later date.

## 2011-10-23 ENCOUNTER — Ambulatory Visit: Payer: BC Managed Care – PPO | Admitting: Cardiovascular Disease

## 2011-10-28 ENCOUNTER — Other Ambulatory Visit: Payer: Self-pay | Admitting: Cardiovascular Disease

## 2011-10-28 ENCOUNTER — Telehealth: Payer: Self-pay

## 2011-10-28 ENCOUNTER — Other Ambulatory Visit: Payer: Self-pay

## 2011-10-28 DIAGNOSIS — R002 Palpitations: Secondary | ICD-10-CM

## 2011-10-28 DIAGNOSIS — R0789 Other chest pain: Secondary | ICD-10-CM

## 2011-10-28 NOTE — Telephone Encounter (Signed)
Notified patient of 48 hour holter monitor results from [facility] per Dr. Mariah Milling. The monitor showed NSR with frequent PVC's, rare short runs of atrial tachycardia per Dr. Mariah Milling. Told the patient she should take metoprolol every am. If she has fatigue, could try bystolic 5 mg take one tablet daily. The patient will contact our office to let us know if she would like to change to bystolic.

## 2011-11-01 ENCOUNTER — Other Ambulatory Visit: Payer: Self-pay | Admitting: Family Medicine

## 2011-11-02 NOTE — Telephone Encounter (Signed)
Can we verify with pt whether she's taking 20 or 40mg  of celexa daily?  In our chart says 20 but refill request asks for 40mg .

## 2011-11-03 MED ORDER — BENAZEPRIL HCL 10 MG PO TABS: 10.0000 mg | ORAL_TABLET | Freq: Every day | ORAL | Status: DC

## 2011-11-03 NOTE — Telephone Encounter (Signed)
Spoke with patient and she said you had increased citalopram to 40 mg. Changed on med list and refilled.

## 2011-11-25 ENCOUNTER — Other Ambulatory Visit: Payer: Self-pay

## 2011-11-25 ENCOUNTER — Ambulatory Visit: Payer: Self-pay | Admitting: Otolaryngology

## 2011-11-25 ENCOUNTER — Other Ambulatory Visit (INDEPENDENT_AMBULATORY_CARE_PROVIDER_SITE_OTHER): Payer: BC Managed Care – PPO

## 2011-11-25 DIAGNOSIS — R0789 Other chest pain: Secondary | ICD-10-CM

## 2011-11-25 LAB — TSH: Thyroid Stimulating Horm: 3.18 u[IU]/mL

## 2011-12-03 ENCOUNTER — Telehealth: Payer: Self-pay

## 2011-12-03 NOTE — Telephone Encounter (Signed)
The patient is taking the metoprolol daily instead of as needed. She has been taking the propranolol as needed also. Claire Strong said at her last office visit Dr. Mariah Milling wanted her to increase the metoprolol to one tablet daily instead of as needed but do not see this was changed at last visit. Please advise if she is to be on the metoprolol succ or tart 25 mg one tablet daily. Please advise what to do.

## 2011-12-04 NOTE — Telephone Encounter (Signed)
She can take metoprolol heart rate 25 mg in the morning and also in the evening if needed. She can take propranolol as needed for breakthrough palpitations .  If she has excess fatigue, we could try  bystolic 5 mg daily  titrating upwards to 10 mg or 20 mg as tolerated This would take the place of metoprolol and she may not need as much propranolol.  Not generic.

## 2011-12-05 ENCOUNTER — Other Ambulatory Visit: Payer: Self-pay

## 2011-12-05 MED ORDER — NEBIVOLOL HCL 5 MG PO TABS: 5.0000 mg | ORAL_TABLET | Freq: Every day | ORAL | Status: DC

## 2011-12-05 MED ORDER — METOPROLOL TARTRATE 25 MG PO TABS: 25.0000 mg | ORAL_TABLET | Freq: Two times a day (BID) | ORAL | Status: DC

## 2011-12-05 NOTE — Telephone Encounter (Signed)
Pt informed to take metoprolol tartrate 25 mg BID with propanolol PRN. She is already doing this.  She will try the metoprolol tartrate 25 mg BID. If fatigue gets worse she will try Bystolic 5 mg qd. I am going to leave samples of Bystolic at [facility]. She understands to not take Bystolic AND metoprolol. She should take either or.  Understanding verb.

## 2011-12-05 NOTE — Telephone Encounter (Signed)
LMTCB

## 2011-12-23 ENCOUNTER — Other Ambulatory Visit: Payer: BC Managed Care – PPO

## 2011-12-24 ENCOUNTER — Other Ambulatory Visit: Payer: Self-pay

## 2011-12-24 MED ORDER — METOPROLOL TARTRATE 25 MG PO TABS: 25.0000 mg | ORAL_TABLET | Freq: Two times a day (BID) | ORAL | Status: DC

## 2012-01-02 ENCOUNTER — Other Ambulatory Visit: Payer: Self-pay | Admitting: Family Medicine

## 2012-01-02 NOTE — Telephone Encounter (Signed)
Rx called in as directed.   

## 2012-01-02 NOTE — Telephone Encounter (Signed)
plz phone in. 

## 2012-01-22 ENCOUNTER — Ambulatory Visit: Payer: Self-pay | Admitting: Otolaryngology

## 2012-01-22 LAB — TSH: Thyroid Stimulating Horm: 3.48 u[IU]/mL

## 2012-01-26 ENCOUNTER — Ambulatory Visit: Payer: BC Managed Care – PPO | Admitting: Cardiovascular Disease

## 2012-01-28 ENCOUNTER — Encounter: Payer: Self-pay | Admitting: Cardiovascular Disease

## 2012-01-28 ENCOUNTER — Ambulatory Visit (INDEPENDENT_AMBULATORY_CARE_PROVIDER_SITE_OTHER): Payer: BC Managed Care – PPO | Admitting: Cardiovascular Disease

## 2012-01-28 VITALS — BP 112/84 | HR 50 | Ht 70.0 in | Wt 232.0 lb

## 2012-01-28 DIAGNOSIS — R002 Palpitations: Secondary | ICD-10-CM

## 2012-01-28 DIAGNOSIS — I10 Essential (primary) hypertension: Secondary | ICD-10-CM

## 2012-01-28 DIAGNOSIS — R609 Edema, unspecified: Secondary | ICD-10-CM

## 2012-01-28 MED ORDER — FUROSEMIDE 20 MG PO TABS: 20.0000 mg | ORAL_TABLET | Freq: Every day | ORAL | Status: DC | PRN

## 2012-01-28 NOTE — Assessment & Plan Note (Signed)
Rhythm has improved on her metoprolol. She is bradycardic with fatigue. If symptoms are significant, we have suggested she could decrease the metoprolol to 12.5 mg twice a day.

## 2012-01-28 NOTE — Patient Instructions (Addendum)
You are doing well. No medication changes were made. It is ok to decrease the metoprolol dose for slow heart rate (you could cut the dose in 1/2 twice a day)  Please try lasix as needed with OTC potassium for edema/swelling  Please call us if you have new issues that need to be addressed before your next appt.  Your physician wants you to follow-up in: 6 months.  You will receive a reminder letter in the mail two months in advance. If you don't receive a letter, please call our office to schedule the follow-up appointment.

## 2012-01-28 NOTE — Assessment & Plan Note (Addendum)
Likely secondary to venous insufficiency given her long history of problems with his. We did offer Lasix for her to take as needed as the left leg is particularly tight. She may have mild diastolic dysfunction and fluid retention. I suggested if she takes Lasix, that she take this with potassium. She does not want a potassium pill and will obtain potassium from other sources. I suggested she take Lasix sparingly. If there's no improvement in her symptoms, her edema/swelling is likely from venous insufficiency, possibly exacerbated by the heat.

## 2012-01-28 NOTE — Progress Notes (Signed)
Patient ID: Claire Strong, female    DOB: [birth year], 51 y.o.   MRN: [id number]  HPI Comments: 51 year old woman with h/o lupus anticoagulant with remote DVT, HTN, horseshoe kidney, asthma and multinodular goiter s/p recent thyroidectomy,  s/p roux en y gastric bypass,  post surgical hypertensive/palpitation episode associated with severe HA seen at [facility] ER, who presents for routine evaluation.  She has had periods of palpitations and tachycardia in the past. She was started on beta blockers after Holter monitor showed atrial tachycardia. She reports that on metoprolol 25 mg twice a day her rhythm is essentially controlled. Rarely she takes propranolol. Heart rate is slow and she does have fatigue. She has chronic knee pain. Weight has been increasing. She has significant varicose veins worse on the left. History of brain surgery on the left 3 times. She has worsening edema bilaterally, very tight on the left. She has followup with endocrine for her thyroid  Father with CAD and CABG at 24 yo, was smoker.  EKG shows normal sinus rhythm with rate 50 beats per minute with no significant ST or T wave changes She reports that she is on benazepril for previous history of flushing       Outpatient Encounter Prescriptions as of 01/28/2012  Medication Sig Dispense Refill  . ACETAMINOPHEN-BUTALBITAL 50-325 MG TABS Take 1 tablet by mouth every 4 (four) hours.      Marland Kitchen albuterol (PROVENTIL HFA;VENTOLIN HFA) 108 (90 BASE) MCG/ACT inhaler Inhale 2 puffs into the lungs every 6 (six) hours as needed.  1 Inhaler  11  . aspirin EC 81 MG tablet Take 1 tablet (81 mg total) by mouth daily.      . B Complex-C (SUPER B COMPLEX PO) Take 1 capsule by mouth daily.        . benazepril (LOTENSIN) 10 MG tablet Take 1 tablet (10 mg total) by mouth daily.  90 tablet  2  . calcium carbonate (OS-CAL) 600 MG TABS Take 600 mg by mouth daily.      . carisoprodol (SOMA) 350 MG tablet TAKE ONE TABLET BY MOUTH AT BEDTIME  60 tablet  1   . Cholecalciferol (VITAMIN D3) 1000 UNITS tablet Take 2,000 Units by mouth daily.       . citalopram (CELEXA) 40 MG tablet TAKE ONE TABLET BY MOUTH EVERY DAY  30 tablet  3  . citalopram (CELEXA) 40 MG tablet Take 40 mg by mouth daily.      . cyanocobalamin (,VITAMIN B-12,) 1000 MCG/ML injection Inject 1 mL (1,000 mcg total) into the muscle every 30 (thirty) days.  10 mL  0  . EPINEPHrine (EPI-PEN) 0.3 mg/0.3 mL DEVI Inject 0.3 mLs (0.3 mg total) into the muscle once.  1 Device  2  . Fluticasone-Salmeterol (ADVAIR DISKUS) 100-50 MCG/DOSE AEPB Inhale 1 puff into the lungs every 12 (twelve) hours.  60 each  6  . levothyroxine (SYNTHROID, LEVOTHROID) 150 MCG tablet Take 150 mcg by mouth daily.      Marland Kitchen loratadine (CLARITIN) 10 MG tablet Take 10 mg by mouth daily.      . Magnesium 250 MG TABS Take 1 tablet by mouth daily.      . metoprolol tartrate (LOPRESSOR) 25 MG tablet Take 1 tablet (25 mg total) by mouth 2 (two) times daily.  60 tablet  3  . montelukast (SINGULAIR) 10 MG tablet Take 10 mg by mouth at bedtime.        . Multiple Vitamin (MULTIVITAMIN) tablet Take 1 tablet by  mouth daily.        . propranolol (INDERAL) 10 MG tablet Take 1 tablet (10 mg total) by mouth 3 (three) times daily as needed.  60 tablet  3  . furosemide (LASIX) 20 MG tablet Take 1 tablet (20 mg total) by mouth daily as needed.  30 tablet  4    Review of Systems  Constitutional: Positive for fatigue.  HENT: Negative.   Eyes: Negative.   Respiratory: Negative.   Gastrointestinal: Negative.   Musculoskeletal: Negative.   Skin: Negative.   Neurological: Negative.   Hematological: Negative.   Psychiatric/Behavioral: Negative.   All other systems reviewed and are negative.    BP 112/84  Pulse 50  Ht 5\' 10"  (1.778 m)  Wt 232 lb (105.235 kg)  BMI 33.29 kg/m2  Physical Exam  Nursing note and vitals reviewed. Constitutional: She is oriented to person, place, and time. She appears well-developed and well-nourished.         Obese  HENT:  Head: Normocephalic.  Nose: Nose normal.  Mouth/Throat: Oropharynx is clear and moist.  Eyes: Conjunctivae are normal. Pupils are equal, round, and reactive to light.  Neck: Normal range of motion. Neck supple. No JVD present.       Well healed surgical incision site from thyroidectomy  Cardiovascular: Normal rate, regular rhythm, S1 normal, S2 normal, normal heart sounds and intact distal pulses.  Exam reveals no gallop and no friction rub.   No murmur heard. Pulmonary/Chest: Effort normal and breath sounds normal. No respiratory distress. She has no wheezes. She has no rales. She exhibits no tenderness.  Abdominal: Soft. Bowel sounds are normal. She exhibits no distension. There is no tenderness.  Musculoskeletal: Normal range of motion. She exhibits edema. She exhibits no tenderness.  Lymphadenopathy:    She has no cervical adenopathy.  Neurological: She is alert and oriented to person, place, and time. Coordination normal.  Skin: Skin is warm and dry. No rash noted. No erythema.  Psychiatric: She has a normal mood and affect. Her behavior is normal. Judgment and thought content normal.         Assessment and Plan

## 2012-02-24 ENCOUNTER — Telehealth: Payer: Self-pay

## 2012-02-24 DIAGNOSIS — Z9884 Bariatric surgery status: Secondary | ICD-10-CM

## 2012-02-24 DIAGNOSIS — E890 Postprocedural hypothyroidism: Secondary | ICD-10-CM

## 2012-02-24 DIAGNOSIS — I10 Essential (primary) hypertension: Secondary | ICD-10-CM

## 2012-02-24 MED ORDER — SCOPOLAMINE 1 MG/3DAYS TD PT72: 1.0000 | MEDICATED_PATCH | TRANSDERMAL | Status: DC

## 2012-02-24 NOTE — Telephone Encounter (Signed)
Pt leaving 03/01/12 for 5 day cruise;request Transderm scope patches to [facility] [place].Please advise.

## 2012-02-24 NOTE — Telephone Encounter (Signed)
plz notify sent in.  Will likely only need to fill 1 script if only 5 day cruise

## 2012-02-25 ENCOUNTER — Other Ambulatory Visit: Payer: Self-pay | Admitting: Family Medicine

## 2012-02-25 DIAGNOSIS — E890 Postprocedural hypothyroidism: Secondary | ICD-10-CM | POA: Insufficient documentation

## 2012-02-25 NOTE — Telephone Encounter (Signed)
Patient notified. She has an upcoming CPE with fasting labs prior. She was asking about checking for arthritis due to swelling,pain and deformity to feet/toes. She was also asking if you were going to recheck her "proteins". I advised we haven't checked any protein levels on her before and I was unclear why she needed them. She said it had to do with the gastric bypass surgery. I advised we had monitored her vitamin levels because they are normally malabsorbed after the surgery, but haven't checked proteins. I said I would mention it to you and see if there was anything more specific that you wanted to order. Please place orders in her chart if you want them. Thanks!

## 2012-02-25 NOTE — Telephone Encounter (Signed)
We check protein levels when we check liver.  i will want to talk with her prior to ordering arthritis blood work. Have placed orders in chart.

## 2012-03-16 ENCOUNTER — Other Ambulatory Visit (INDEPENDENT_AMBULATORY_CARE_PROVIDER_SITE_OTHER): Payer: BC Managed Care – PPO

## 2012-03-16 DIAGNOSIS — I10 Essential (primary) hypertension: Secondary | ICD-10-CM

## 2012-03-16 DIAGNOSIS — Z9884 Bariatric surgery status: Secondary | ICD-10-CM

## 2012-03-16 DIAGNOSIS — E890 Postprocedural hypothyroidism: Secondary | ICD-10-CM

## 2012-03-16 LAB — COMPREHENSIVE METABOLIC PANEL
ALT: 20 U/L (ref 0–35)
AST: 23 U/L (ref 0–37)
Albumin: 3.6 g/dL (ref 3.5–5.2)
Alkaline Phosphatase: 109 U/L (ref 39–117)
BUN: 15 mg/dL (ref 6–23)
Calcium: 8.4 mg/dL (ref 8.4–10.5)
Chloride: 102 mEq/L (ref 96–112)
Creatinine, Ser: 0.9 mg/dL (ref 0.4–1.2)
GFR: 72.11 mL/min (ref >60.00)
Glucose, Bld: 80 mg/dL (ref 70–99)
Sodium: 137 mEq/L (ref 135–145)

## 2012-03-16 LAB — CBC WITH DIFFERENTIAL/PLATELET
Basophils Relative: 0.5 % (ref 0.0–3.0)
Eosinophils Relative: 2.9 % (ref 0.0–5.0)
Lymphocytes Relative: 35 % (ref 12.0–46.0)
MCHC: 33.1 g/dL (ref 30.0–36.0)
MCV: 90.6 fl (ref 78.0–100.0)
Monocytes Absolute: 0.3 10*3/uL (ref 0.1–1.0)
Monocytes Relative: 6.4 % (ref 3.0–12.0)
Neutrophils Relative %: 55.2 % (ref 43.0–77.0)
Platelets: 153 10*3/uL (ref 150.0–400.0)
RBC: 4.41 Mil/uL (ref 3.87–5.11)
WBC: 4.3 10*3/uL — ABNORMAL LOW (ref 4.5–10.5)

## 2012-03-16 LAB — LIPID PANEL
Cholesterol: 170 mg/dL (ref 0–200)
LDL Cholesterol: 96 mg/dL (ref 0–99)
Triglycerides: 137 mg/dL (ref 0.0–149.0)
VLDL: 27.4 mg/dL (ref 0.0–40.0)

## 2012-03-16 LAB — TSH: TSH: 0.52 u[IU]/mL (ref 0.35–5.50)

## 2012-03-16 LAB — FOLATE: Folate: >24.8 ng/mL (ref >5.9)

## 2012-03-16 LAB — VITAMIN B12: Vitamin B-12: 582 pg/mL (ref 211–911)

## 2012-03-17 LAB — VITAMIN D 25 HYDROXY (VIT D DEFICIENCY, FRACTURES): Vit D, 25-Hydroxy: 46 ng/mL (ref 30–89)

## 2012-03-22 ENCOUNTER — Ambulatory Visit: Payer: Self-pay | Admitting: Otolaryngology

## 2012-03-22 ENCOUNTER — Encounter: Payer: BC Managed Care – PPO | Admitting: Family Medicine

## 2012-03-22 LAB — TSH: Thyroid Stimulating Horm: 0.66 u[IU]/mL

## 2012-03-31 ENCOUNTER — Encounter: Payer: Self-pay | Admitting: Family Medicine

## 2012-03-31 ENCOUNTER — Ambulatory Visit (INDEPENDENT_AMBULATORY_CARE_PROVIDER_SITE_OTHER): Payer: BC Managed Care – PPO | Admitting: Family Medicine

## 2012-03-31 ENCOUNTER — Encounter: Payer: Self-pay | Admitting: Gastroenterology

## 2012-03-31 VITALS — BP 110/80 | HR 68 | Temp 98.5°F | Ht 70.0 in | Wt 234.8 lb

## 2012-03-31 DIAGNOSIS — M79672 Pain in left foot: Secondary | ICD-10-CM | POA: Insufficient documentation

## 2012-03-31 DIAGNOSIS — M25559 Pain in unspecified hip: Secondary | ICD-10-CM

## 2012-03-31 DIAGNOSIS — E890 Postprocedural hypothyroidism: Secondary | ICD-10-CM

## 2012-03-31 DIAGNOSIS — F329 Major depressive disorder, single episode, unspecified: Secondary | ICD-10-CM

## 2012-03-31 DIAGNOSIS — M25551 Pain in right hip: Secondary | ICD-10-CM | POA: Insufficient documentation

## 2012-03-31 DIAGNOSIS — Z1211 Encounter for screening for malignant neoplasm of colon: Secondary | ICD-10-CM

## 2012-03-31 DIAGNOSIS — Z0000 Encounter for general adult medical examination without abnormal findings: Secondary | ICD-10-CM

## 2012-03-31 DIAGNOSIS — M25511 Pain in right shoulder: Secondary | ICD-10-CM | POA: Insufficient documentation

## 2012-03-31 DIAGNOSIS — N951 Menopausal and female climacteric states: Secondary | ICD-10-CM

## 2012-03-31 DIAGNOSIS — F3289 Other specified depressive episodes: Secondary | ICD-10-CM

## 2012-03-31 DIAGNOSIS — M79609 Pain in unspecified limb: Secondary | ICD-10-CM

## 2012-03-31 DIAGNOSIS — M79673 Pain in unspecified foot: Secondary | ICD-10-CM

## 2012-03-31 DIAGNOSIS — R232 Flushing: Secondary | ICD-10-CM

## 2012-03-31 DIAGNOSIS — M25519 Pain in unspecified shoulder: Secondary | ICD-10-CM

## 2012-03-31 DIAGNOSIS — Z23 Encounter for immunization: Secondary | ICD-10-CM

## 2012-03-31 DIAGNOSIS — F32A Depression, unspecified: Secondary | ICD-10-CM

## 2012-03-31 MED ORDER — CITALOPRAM HYDROBROMIDE 20 MG PO TABS: 30.0000 mg | ORAL_TABLET | Freq: Every day | ORAL | Status: DC

## 2012-03-31 MED ORDER — CELECOXIB 100 MG PO CAPS: 100.0000 mg | ORAL_CAPSULE | Freq: Two times a day (BID) | ORAL | Status: DC | PRN

## 2012-03-31 MED ORDER — DICLOFENAC SODIUM 1 % TD GEL: 2.0000 g | Freq: Four times a day (QID) | TRANSDERMAL | Status: DC | PRN

## 2012-03-31 NOTE — Progress Notes (Signed)
Subjective:    Patient ID: Claire Strong, female    DOB: [birth year], 51 y.o.   MRN: [id number]  HPI CC: CPE  Depression - would like to decrease celexa.  Thinks mood issues are more stress related.  Fall 12/2011 - hit right hip and right shoulder.  Worried about rotator cuff injury.  Did exercises for RTC but not helping.  Hurts in lateral hip, worse with walking.  Shoulder pain limits overhead activity.  Hasn't tried meds other than soma.  avoids NSAIDs 2/2 h/o gastric bypass. Denies neck pain, numbness/tingling/weakness down arm.  + numbness down left lateral leg.  No weakness of leg.  No change in lower back pain.  Having burning pain in bilateral feet worse with close toed shoes.  Wt Readings from Last 3 Encounters:  03/31/12 234 lb 12 oz (106.482 kg)  01/28/12 232 lb (105.235 kg)  10/17/11 218 lb 4 oz (98.998 kg)  not staying active.    Caffeine: 1 cup coffee/day Lives with husband Claire Noa (Bill)) and mother in Social worker, youngest daughter.  1 dog. Edu: 12th grade Occupation: Environmental health practitioner Activity: taking care of grandson, no regular exercise Diet: good water, fruits/vegetables daily  Preventative:  Last CPE 03/2011, with blood work.  Last mammogram 03/2011. Has scheduled 04/15/2012.  Last pap smear 2009, always normal.  S/p hysterectomy for uterine prolapse 1989. Ovaries remain.  Having worsening hot flashes for the last year.  Prior on black cohosh, then stopped this.  It had worked well in past. Colon cancer screening - had colonoscopy years ago, normal.  Thinks about 15 yrs ago.  Discussed options, would like to have rpt colonoscopy tdap given 12/19/2010.  Flu shot today.   Medications and allergies reviewed and updated in chart.  Past histories reviewed and updated if relevant as below. Patient Active Problem List  Diagnosis  . Nasal septal perforation  . S/P gastric bypass  . Horseshoe kidney  . HTN (hypertension)  . Depression  . Lupus anticoagulant disorder   . Healthcare maintenance  . Neck pain  . Hot flashes  . Left knee pain  . Chest pain  . Bronchial asthma  . Headache  . Palpitations  . Edema  . Hypothyroidism, postsurgical   Past Medical History  Diagnosis Date  . Lupus anticoagulant disorder   . Arthritis   . Depression   . HTN (hypertension)   . Kidney stones     s/p renal hematoma after lithotripsy  . Horseshoe kidney     sole, R kidney damage  . 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 Jacqulyn Ducking  . Ulcer 2009  . History of DVT (deep vein thrombosis)     after 1st pregnancy  . Nasal septal perforation     chronic, ENT rec avoid antihistamine, INS  . Seasonal allergies     allergy shots, singulair  . Multinodular goiter     s/p thyroidectomy  . Bronchial asthma   . Post-surgical hypothyroidism     for multinodular goiter   Past Surgical History  Procedure Date  . Gastric bypass 05/21/05    Dr. Jacqulyn Ducking, Roux-en-Y  . Partial hysterectomy 1989    ovaries remain  . Back surgery 2009  . Cholecystectomy 1998  . Appendectomy 1978  . Tonsillectomy 1969  . Nasal septum surgery 1981    deviated septum, [facility]  . Total thyroidectomy 09/2011    benign path (done for multinodular goiter concern for cancer)   History  Substance Use Topics  .  Smoking status: Former Smoker    Quit date: 06/16/2004  . Smokeless tobacco: Never Used   Comment: Socially-no longer  . Alcohol Use: Yes     Occasionally-wine   Family History  Problem Relation Age of Onset  . Diabetes Mother     prediabetes  . Alzheimer's disease Mother   . Hyperlipidemia Mother   . Diabetes Father   . Coronary artery disease Father     CABG  . Alcohol abuse Father   . Cancer Father     lung  . Cancer Maternal Uncle     brain  . Diabetes Paternal Grandmother   . Stroke Neg Hx    Allergies  Allergen Reactions  . Sulfa Antibiotics Shortness Of Breath  . Nutritional Supplements Swelling   Current Outpatient Prescriptions on File Prior to  Visit  Medication Sig Dispense Refill  . ACETAMINOPHEN-BUTALBITAL 50-325 MG TABS Take 1 tablet by mouth every 4 (four) hours.      Marland Kitchen albuterol (PROVENTIL HFA;VENTOLIN HFA) 108 (90 BASE) MCG/ACT inhaler Inhale 2 puffs into the lungs every 6 (six) hours as needed.  1 Inhaler  11  . aspirin EC 81 MG tablet Take 1 tablet (81 mg total) by mouth daily.      . B Complex-C (SUPER B COMPLEX PO) Take 1 capsule by mouth daily.        . benazepril (LOTENSIN) 10 MG tablet Take 1 tablet (10 mg total) by mouth daily.  90 tablet  2  . calcium carbonate (OS-CAL) 600 MG TABS Take 600 mg by mouth daily.      . carisoprodol (SOMA) 350 MG tablet TAKE ONE TABLET BY MOUTH AT BEDTIME  60 tablet  1  . Cholecalciferol (VITAMIN D3) 1000 UNITS tablet Take 2,000 Units by mouth daily.       . cyanocobalamin (,VITAMIN B-12,) 1000 MCG/ML injection Inject 1 mL (1,000 mcg total) into the muscle every 30 (thirty) days.  10 mL  0  . EPINEPHrine (EPI-PEN) 0.3 mg/0.3 mL DEVI Inject 0.3 mLs (0.3 mg total) into the muscle once.  1 Device  2  . Fluticasone-Salmeterol (ADVAIR DISKUS) 100-50 MCG/DOSE AEPB Inhale 1 puff into the lungs every 12 (twelve) hours.  60 each  6  . furosemide (LASIX) 20 MG tablet Take 1 tablet (20 mg total) by mouth daily as needed.  30 tablet  4  . loratadine (CLARITIN) 10 MG tablet Take 10 mg by mouth daily.      . Magnesium 250 MG TABS Take 1 tablet by mouth daily.      . metoprolol tartrate (LOPRESSOR) 25 MG tablet Take 1 tablet (25 mg total) by mouth 2 (two) times daily.  60 tablet  3  . montelukast (SINGULAIR) 10 MG tablet Take 10 mg by mouth at bedtime.        . Multiple Vitamin (MULTIVITAMIN) tablet Take 1 tablet by mouth daily.        . propranolol (INDERAL) 10 MG tablet Take 1 tablet (10 mg total) by mouth 3 (three) times daily as needed.  60 tablet  3  . scopolamine (TRANSDERM-SCOP) 1.5 MG Place 1 patch (1.5 mg total) onto the skin every 3 (three) days.  4 patch  0     Review of Systems    Constitutional: Positive for unexpected weight change. Negative for fever, chills, activity change, appetite change and fatigue.  HENT: Negative for hearing loss and neck pain.   Eyes: Negative for visual disturbance.  Respiratory: Negative for cough,  chest tightness, shortness of breath and wheezing.   Cardiovascular: Positive for leg swelling. Negative for chest pain and palpitations.  Gastrointestinal: Negative for nausea, vomiting, abdominal pain, diarrhea, constipation, blood in stool and abdominal distention.  Genitourinary: Negative for hematuria and difficulty urinating.  Musculoskeletal: Negative for myalgias and arthralgias.  Skin: Negative for rash.  Neurological: Negative for dizziness, seizures, syncope and headaches.  Hematological: Does not bruise/bleed easily.  Psychiatric/Behavioral: Negative for dysphoric mood. The patient is not nervous/anxious.        Objective:   Physical Exam  Nursing note and vitals reviewed. Constitutional: She is oriented to person, place, and time. She appears well-developed and well-nourished. No distress.  HENT:  Head: Normocephalic and atraumatic.  Right Ear: Hearing, tympanic membrane, external ear and ear canal normal.  Left Ear: Hearing, tympanic membrane, external ear and ear canal normal.  Nose: Nose normal.  Mouth/Throat: Oropharynx is clear and moist. No oropharyngeal exudate.  Eyes: Conjunctivae normal and EOM are normal. Pupils are equal, round, and reactive to light. No scleral icterus.  Neck: Normal range of motion. Neck supple.  Cardiovascular: Normal rate, regular rhythm, normal heart sounds and intact distal pulses.   No murmur heard. Pulses:      Radial pulses are 2+ on the right side, and 2+ on the left side.  Pulmonary/Chest: Effort normal and breath sounds normal. No respiratory distress. She has no wheezes. She has no rales. Right breast exhibits no inverted nipple, no mass, no nipple discharge, no skin change and no  tenderness. Left breast exhibits no inverted nipple, no mass, no nipple discharge, no skin change and no tenderness.  Abdominal: Soft. Bowel sounds are normal. She exhibits no distension and no mass. There is no tenderness. There is no rebound and no guarding.  Musculoskeletal: Normal range of motion. She exhibits no edema.       No midline cervical spine tenderness. + tender to palpation R subacromial bursa anterior and posterior. Limited ROM R shoulder in forward flexion and extension. + empty can sign.  Pain with testing ext rotation against resistance. Neg SLR bilaterally.  No pain at hip with int/ext rotation of hip. + pain at R GTB. No pain at bilat sciatic notch or SIJ  Lymphadenopathy:    She has no cervical adenopathy.    She has no axillary adenopathy.       Right axillary: No lateral adenopathy present.       Left axillary: No lateral adenopathy present.      Right: No supraclavicular adenopathy present.       Left: No supraclavicular adenopathy present.  Neurological: She is alert and oriented to person, place, and time.       CN grossly intact, station and gait intact  Skin: Skin is warm and dry. No rash noted.  Psychiatric: She has a normal mood and affect. Her behavior is normal. Judgment and thought content normal.       Assessment & Plan:

## 2012-03-31 NOTE — Assessment & Plan Note (Signed)
Slowly taper down celexa, start at 30mg  daily.  Sent in new dose to pharmacy.

## 2012-03-31 NOTE — Assessment & Plan Note (Signed)
Anticipate greater trochanteric bursitis Treat with voltaren gel and stretching exercises provided today. If not better, recommended f/u with ortho for possible steroid injection.

## 2012-03-31 NOTE — Assessment & Plan Note (Signed)
Burning lateral foot pain - recommended back off high heeled shoes, wear flat soled shoes and try celebrex.  ?nerve compression 2/2 ill fitting shoes.

## 2012-03-31 NOTE — Patient Instructions (Addendum)
Flu shot today. Let's back down to celexa 30mg  daily (1 and a half pills daily).  Do this for 1 month then see if we can back down to 20mg  daily. I think you have right greater trochanteric bursitis as well as shoulder bursitis and likely rotator cuff injury - treat with stretching exercises provided and voltaren gel sent into pharmacy.  May try celebrex as anti inflammatory for short course to see if this will help foot pain and hip and shoulder pains. If not improved, I recommend seeing your orthopedic. Good to see you today, call us with questions.

## 2012-03-31 NOTE — Assessment & Plan Note (Signed)
Anticipate subacromial bursitis as well as likely rotator cuff tendinopathy. Treat with stretching exercises provided, voltaren gel and celebrex (given GI hx). If not better, recommend see her orthopedist for further evaluation.

## 2012-03-31 NOTE — Assessment & Plan Note (Signed)
Well controlled now on current thyroxine dose.

## 2012-03-31 NOTE — Assessment & Plan Note (Addendum)
celexa has not helped. discussed restarting black cohosh H/o hysterectomy but ovaries remained.  Likely menopausal

## 2012-03-31 NOTE — Assessment & Plan Note (Signed)
Preventative protocols reviewed and updated unless pt declined. Discussed healthy diet and lifestyle. Reviewed blood work -all normal.

## 2012-04-02 ENCOUNTER — Telehealth: Payer: Self-pay | Admitting: *Deleted

## 2012-04-02 NOTE — Telephone Encounter (Signed)
Filled and placed in Kim's box. 

## 2012-04-02 NOTE — Telephone Encounter (Signed)
Forms faxed

## 2012-04-02 NOTE — Telephone Encounter (Signed)
PA forms for Celebrex and Voltaren Gel in your IN box.

## 2012-04-08 NOTE — Telephone Encounter (Signed)
Memo received from [employer] for PA for Celebrex and Voltaren gel was not approved; pt not active until 06/16/2012. Put in Dr Sharen Hones box for signature and scanning.

## 2012-04-15 ENCOUNTER — Ambulatory Visit: Payer: Self-pay | Admitting: Family Medicine

## 2012-04-15 ENCOUNTER — Encounter: Payer: Self-pay | Admitting: Family Medicine

## 2012-04-16 ENCOUNTER — Encounter: Payer: Self-pay | Admitting: *Deleted

## 2012-04-26 ENCOUNTER — Other Ambulatory Visit: Payer: Self-pay | Admitting: Family Medicine

## 2012-04-26 NOTE — Telephone Encounter (Signed)
Rx called in as directed.   

## 2012-04-26 NOTE — Telephone Encounter (Signed)
plz phone in. 

## 2012-05-04 ENCOUNTER — Other Ambulatory Visit: Payer: Self-pay | Admitting: Family Medicine

## 2012-05-06 MED ORDER — CYANOCOBALAMIN 1000 MCG/ML IJ SOLN: 1000.0000 ug | INTRAMUSCULAR | Status: DC

## 2012-05-06 NOTE — Telephone Encounter (Signed)
Confirmed with pharmacy that patient had picked Rx last week. Notified patient of same. She had forgotten that she had picked it up.

## 2012-05-06 NOTE — Telephone Encounter (Signed)
Rx request came through my chart.   plz check - I believe soma was phoned in 04/26/12 - #[phone].  If that is the case, then too early to phone in.  If not, may phone in.

## 2012-05-11 ENCOUNTER — Encounter: Payer: Self-pay | Admitting: Gastroenterology

## 2012-05-11 ENCOUNTER — Ambulatory Visit (AMBULATORY_SURGERY_CENTER): Payer: BC Managed Care – PPO | Admitting: *Deleted

## 2012-05-11 VITALS — Ht 70.0 in | Wt 241.8 lb

## 2012-05-11 DIAGNOSIS — Z1211 Encounter for screening for malignant neoplasm of colon: Secondary | ICD-10-CM

## 2012-05-11 MED ORDER — NA SULFATE-K SULFATE-MG SULF 17.5-3.13-1.6 GM/177ML PO SOLN: ORAL | Status: DC

## 2012-05-16 HISTORY — PX: COLONOSCOPY: SHX174

## 2012-05-19 ENCOUNTER — Encounter: Payer: Self-pay | Admitting: Cardiovascular Disease

## 2012-05-20 ENCOUNTER — Other Ambulatory Visit: Payer: Self-pay

## 2012-05-20 MED ORDER — METOPROLOL TARTRATE 25 MG PO TABS: 25.0000 mg | ORAL_TABLET | Freq: Two times a day (BID) | ORAL | Status: DC

## 2012-05-20 NOTE — Telephone Encounter (Signed)
Refill sent for metoprolol.  

## 2012-05-21 ENCOUNTER — Encounter: Payer: Self-pay | Admitting: Gastroenterology

## 2012-05-21 ENCOUNTER — Ambulatory Visit (AMBULATORY_SURGERY_CENTER): Payer: BC Managed Care – PPO | Admitting: Gastroenterology

## 2012-05-21 VITALS — BP 116/64 | HR 54 | Temp 97.9°F | Resp 22 | Ht 70.0 in | Wt 241.0 lb

## 2012-05-21 DIAGNOSIS — K5730 Diverticulosis of large intestine without perforation or abscess without bleeding: Secondary | ICD-10-CM

## 2012-05-21 DIAGNOSIS — D126 Benign neoplasm of colon, unspecified: Secondary | ICD-10-CM

## 2012-05-21 DIAGNOSIS — Z1211 Encounter for screening for malignant neoplasm of colon: Secondary | ICD-10-CM

## 2012-05-21 MED ORDER — SODIUM CHLORIDE 0.9 % IV SOLN: 500.0000 mL | INTRAVENOUS | Status: DC

## 2012-05-21 NOTE — Op Note (Signed)
[place]  [address] [place], [address]   COLONOSCOPY PROCEDURE REPORT  PATIENT: Claire Strong, Claire Strong  MR#: [id number] BIRTHDATE: May 09, 1961 , 51  yrs. old GENDER: Female ENDOSCOPIST: Inda Castle, MD REFERRED PR:FFMBWG Gutierrez, M.D. PROCEDURE DATE:  05/21/2012 PROCEDURE:   Colonoscopy with snare polypectomy and Colonoscopy with cold biopsy polypectomy ASA CLASS:   Class II INDICATIONS:average risk screening. MEDICATIONS: MAC sedation, administered by CRNA and propofol (Diprivan) 300mg  IV  DESCRIPTION OF PROCEDURE:   After the risks benefits and alternatives of the procedure were thoroughly explained, informed consent was obtained.  A digital rectal exam revealed no abnormalities of the rectum.   The LB [id number]  endoscope was introduced through the anus and advanced to the cecum, which was identified by both the appendix and ileocecal valve. No adverse events experienced.   The quality of the prep was Suprep good  The instrument was then slowly withdrawn as the colon was fully examined.      COLON FINDINGS: Two sessile polyps were found in the sigmoid colon measuring 2 and 3 mm , respectively. The smaller polyp was removed with a cold biopsy forceps. The larger polyp was removed with a cold polypectomy snare. Specimens were submitted to pathology. Mild diverticulosis was noted in the sigmoid colon.   The colon mucosa was otherwise normal.  Retroflexed views revealed no abnormalities. The time to cecum=6 minutes 26 seconds.  Withdrawal time=10 minutes 17 seconds.  The scope was withdrawn and the procedure completed. COMPLICATIONS: There were no complications.  ENDOSCOPIC IMPRESSION: 1.   Two sessile polyps were found in the sigmoid colon 2.   Mild diverticulosis was noted in the sigmoid colon 3.   The colon mucosa was otherwise normal  RECOMMENDATIONS: If the polyp(s) removed today are proven to be adenomatous (pre-cancerous) polyps, you  will need a repeat colonoscopy in 5 years.  Otherwise you should continue to follow colorectal cancer screening guidelines for "routine risk" patients with colonoscopy in 10 years.  You will receive a letter within 1-2 weeks with the results of your biopsy as well as final recommendations.  Please call my office if you have not received a letter after 3 weeks.   eSigned:  Inda Castle, MD 05/21/2012 9:58 AM   cc:   PATIENT NAME:  Claire Strong, Claire Strong MR#: [id number]

## 2012-05-21 NOTE — Patient Instructions (Addendum)
Findings:  Polyps, Mild Diverticulosis Recommendations:  Repeat colonoscopy in 5-10 years depending on pathology.  YOU HAD AN ENDOSCOPIC PROCEDURE TODAY AT THE [facility]: Refer to the procedure report that was given to you for any specific questions about what was found during the examination.  If the procedure report does not answer your questions, please call your gastroenterologist to clarify.  If you requested that your care partner not be given the details of your procedure findings, then the procedure report has been included in a sealed envelope for you to review at your convenience later.  YOU SHOULD EXPECT: Some feelings of bloating in the abdomen. Passage of more gas than usual.  Walking can help get rid of the air that was put into your GI tract during the procedure and reduce the bloating. If you had a lower endoscopy (such as a colonoscopy or flexible sigmoidoscopy) you may notice spotting of blood in your stool or on the toilet paper. If you underwent a bowel prep for your procedure, then you may not have a normal bowel movement for a few days.  DIET: Your first meal following the procedure should be a light meal and then it is ok to progress to your normal diet.  A half-sandwich or bowl of soup is an example of a good first meal.  Heavy or fried foods are harder to digest and may make you feel nauseous or bloated.  Likewise meals heavy in dairy and vegetables can cause extra gas to form and this can also increase the bloating.  Drink plenty of fluids but you should avoid alcoholic beverages for 24 hours.  ACTIVITY: Your care partner should take you home directly after the procedure.  You should plan to take it easy, moving slowly for the rest of the day.  You can resume normal activity the day after the procedure however you should NOT DRIVE or use heavy machinery for 24 hours (because of the sedation medicines used during the test).    SYMPTOMS TO REPORT IMMEDIATELY: A  gastroenterologist can be reached at any hour.  During normal business hours, 8:30 AM to 5:00 PM Monday through Friday, call [phone].  After hours and on weekends, please call the GI answering service at [phone] who will take a message and have the physician on call contact you.   Following lower endoscopy (colonoscopy or flexible sigmoidoscopy):  Excessive amounts of blood in the stool  Significant tenderness or worsening of abdominal pains  Swelling of the abdomen that is new, acute  Fever of 100F or higher  Following upper endoscopy (EGD)  Vomiting of blood or coffee ground material  New chest pain or pain under the shoulder blades  Painful or persistently difficult swallowing  New shortness of breath  Fever of 100F or higher  Black, tarry-looking stools  FOLLOW UP: If any biopsies were taken you will be contacted by phone or by letter within the next 1-3 weeks.  Call your gastroenterologist if you have not heard about the biopsies in 3 weeks.  Our staff will call the home number listed on your records the next business day following your procedure to check on you and address any questions or concerns that you may have at that time regarding the information given to you following your procedure. This is a courtesy call and so if there is no answer at the home number and we have not heard from you through the emergency physician on call, we will assume that  you have returned to your regular daily activities without incident.  SIGNATURES/CONFIDENTIALITY: You and/or your care partner have signed paperwork which will be entered into your electronic medical record.  These signatures attest to the fact that that the information above on your After Visit Summary has been reviewed and is understood.  Full responsibility of the confidentiality of this discharge information lies with you and/or your care-partner.   Please follow all discharge instructions given to you by the recovery  room nurse. If you have any questions or problems after discharge please call one of the numbers listed above. You will receive a phone call in the am to see how you are doing and answer any questions you may have. [facility] you [facility].

## 2012-05-21 NOTE — Progress Notes (Signed)
Patient did not experience any of the following events: a burn prior to discharge; a fall within the facility; wrong site/side/patient/procedure/implant event; or a hospital transfer or hospital admission upon discharge from the facility. (G8907) Patient did not have preoperative order for IV antibiotic SSI prophylaxis. (G8918)  

## 2012-05-24 ENCOUNTER — Telehealth: Payer: Self-pay | Admitting: *Deleted

## 2012-05-24 NOTE — Telephone Encounter (Signed)
  Follow up Call-  Call back number 05/21/2012  Post procedure Call Back phone  # [phone]  Permission to leave phone message Yes     Patient questions:  Do you have a fever, pain , or abdominal swelling? no Pain Score  0 *  Have you tolerated food without any problems? yes  Have you been able to return to your normal activities? yes  Do you have any questions about your discharge instructions: Diet   no Medications  no Follow up visit  no  Do you have questions or concerns about your Care? no  Actions: * If pain score is 4 or above: No action needed, pain <4.

## 2012-05-26 ENCOUNTER — Encounter: Payer: Self-pay | Admitting: Gastroenterology

## 2012-05-26 ENCOUNTER — Encounter: Payer: Self-pay | Admitting: Family Medicine

## 2012-05-29 ENCOUNTER — Other Ambulatory Visit: Payer: Self-pay | Admitting: Family Medicine

## 2012-05-29 NOTE — Telephone Encounter (Signed)
plz phone in. 

## 2012-05-31 NOTE — Telephone Encounter (Signed)
Rx called in as directed.   

## 2012-06-11 ENCOUNTER — Ambulatory Visit (INDEPENDENT_AMBULATORY_CARE_PROVIDER_SITE_OTHER): Payer: BC Managed Care – PPO | Admitting: Family Medicine

## 2012-06-11 ENCOUNTER — Encounter: Payer: Self-pay | Admitting: Family Medicine

## 2012-06-11 VITALS — BP 126/86 | HR 63 | Temp 98.8°F | Ht 70.0 in | Wt 238.5 lb

## 2012-06-11 DIAGNOSIS — J019 Acute sinusitis, unspecified: Secondary | ICD-10-CM

## 2012-06-11 DIAGNOSIS — B9689 Other specified bacterial agents as the cause of diseases classified elsewhere: Secondary | ICD-10-CM

## 2012-06-11 MED ORDER — AMOXICILLIN-POT CLAVULANATE 875-125 MG PO TABS: 1.0000 | ORAL_TABLET | Freq: Two times a day (BID) | ORAL | Status: DC

## 2012-06-11 NOTE — Assessment & Plan Note (Signed)
S/p uri with sinus pain and also early OM in L ear  tx with augmentin Disc symptomatic care - see instructions on AVS  Update if not starting to improve in a week or if worsening

## 2012-06-11 NOTE — Patient Instructions (Addendum)
Take the augmentin for acute sinus infection as directed mucinex is good for congestion  Also saline nasal spray and breathing steam  Update if not starting to improve in a week or if worsening   If wheezing worsens -please alert [place]

## 2012-06-11 NOTE — Progress Notes (Signed)
Subjective:    Patient ID: Claire Strong, female    DOB: [birth year], 51 y.o.   MRN: [id number]  HPI Here with uri symptoms Bad headache since 12/25 Started with sneezing and congestion for over a week Ears are full and popping- today - is painful   Cough is worsening  Green d/c -nasal and chest   No otc med - has hx of heart palpitations  Takes naproxen and that is not helping headache  No fever, but has had some chills   Using advair to prevent wheeze  Takes singulair and claritin   Patient Active Problem List  Diagnosis  . Nasal septal perforation  . S/P gastric bypass  . Horseshoe kidney  . HTN (hypertension)  . Depression  . Lupus anticoagulant disorder  . Healthcare maintenance  . Neck pain  . Hot flashes  . Left knee pain  . Chest pain  . Bronchial asthma  . Headache  . Palpitations  . Edema  . Hypothyroidism, postsurgical  . Right shoulder pain  . Right hip pain  . Foot pain   Past Medical History  Diagnosis Date  . Lupus anticoagulant disorder   . Arthritis   . Depression   . HTN (hypertension)   . Kidney stones     s/p renal hematoma after lithotripsy  . Horseshoe kidney     sole, R kidney damage  . 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 Jacqulyn Ducking  . Ulcer 2009  . History of DVT (deep vein thrombosis)     after 1st pregnancy  . Nasal septal perforation     chronic, ENT rec avoid antihistamine, INS  . Seasonal allergies     allergy shots, singulair  . Multinodular goiter     s/p thyroidectomy  . Bronchial asthma   . Post-surgical hypothyroidism     for multinodular goiter  . Diverticulosis     mild by colonoscopy   Past Surgical History  Procedure Date  . Gastric bypass 05/21/05    Dr. Jacqulyn Ducking, Roux-en-Y  . Partial hysterectomy 1989    ovaries remain  . Back surgery 2009  . Cholecystectomy 1998  . Appendectomy 1978  . Tonsillectomy 1969  . Nasal septum surgery 1981    deviated septum, repaired at [facility]  . Total thyroidectomy 09/2011    benign path (done for multinodular goiter concern for cancer)  . Colonoscopy 05/2012    hyperplastic polyps x2, mild diverticulosis Arlyce Dice) rpt 10 yrs   History  Substance Use Topics  . Smoking status: Former Smoker    Quit date: 06/16/2004  . Smokeless tobacco: Never Used     Comment: Socially-no longer  . Alcohol Use: Yes     Comment: Occasionally-wine   Family History  Problem Relation Age of Onset  . Diabetes Mother     prediabetes  . Alzheimer's disease Mother   . Hyperlipidemia Mother   . Diabetes Father   . Coronary artery disease Father 59    CABG  . Alcohol abuse Father   . Cancer Father     lung  . Cancer Maternal Uncle     brain  . Diabetes Paternal Grandmother   . Stroke Neg Hx   . Colon cancer Neg Hx   . Stomach cancer Neg Hx    Allergies  Allergen Reactions  . Sulfa Antibiotics Shortness Of Breath  . Nutritional Supplements Swelling   Current Outpatient Prescriptions on File Prior to Visit  Medication Sig Dispense Refill  . ACETAMINOPHEN-BUTALBITAL 50-325 MG  TABS Take 1 tablet by mouth every 4 (four) hours.      Marland Kitchen albuterol (PROVENTIL HFA;VENTOLIN HFA) 108 (90 BASE) MCG/ACT inhaler Inhale 2 puffs into the lungs every 6 (six) hours as needed.  1 Inhaler  11  . ALLERGIST TRAY 1/2CC 27GX1/2" 27G X 1/2" 0.5 ML KIT once a week.       . B Complex-C (SUPER B COMPLEX PO) Take 1 capsule by mouth daily.        . benazepril (LOTENSIN) 10 MG tablet Take 1 tablet (10 mg total) by mouth daily.  90 tablet  2  . calcium carbonate (OS-CAL) 600 MG TABS Take 600 mg by mouth daily.      . carisoprodol (SOMA) 350 MG tablet TAKE ONE TABLET BY MOUTH AT BEDTIME  60 tablet  0  . Cholecalciferol (VITAMIN D3) 1000 UNITS tablet Take 2,000 Units by mouth daily.       . citalopram (CELEXA) 20 MG tablet Take 1.5 tablets (30 mg total) by mouth daily.  45 tablet  6  . cyanocobalamin (,VITAMIN B-12,) 1000 MCG/ML injection Inject 1 mL (1,000 mcg total) into the muscle every 30 (thirty)  days.  10 mL  0  . EPINEPHrine (EPI-PEN) 0.3 mg/0.3 mL DEVI Inject 0.3 mLs (0.3 mg total) into the muscle once.  1 Device  2  . Fluticasone-Salmeterol (ADVAIR DISKUS) 100-50 MCG/DOSE AEPB Inhale 1 puff into the lungs every 12 (twelve) hours.  60 each  6  . furosemide (LASIX) 20 MG tablet Take 1 tablet (20 mg total) by mouth daily as needed.  30 tablet  4  . levothyroxine (SYNTHROID, LEVOTHROID) 75 MCG tablet Take 75 mcg by mouth daily.      Marland Kitchen levothyroxine (SYNTHROID, LEVOTHROID) 88 MCG tablet Take 88 mcg by mouth daily.      Marland Kitchen loratadine (CLARITIN) 10 MG tablet Take 10 mg by mouth daily.      . Magnesium 250 MG TABS Take 1 tablet by mouth daily.      . metoprolol tartrate (LOPRESSOR) 25 MG tablet Take 1 tablet (25 mg total) by mouth 2 (two) times daily.  60 tablet  6  . montelukast (SINGULAIR) 10 MG tablet Take 10 mg by mouth at bedtime.        . Multiple Vitamin (MULTIVITAMIN) tablet Take 1 tablet by mouth daily.        . ondansetron (ZOFRAN) 4 MG tablet Take 4 mg by mouth every 8 (eight) hours as needed.       . Potassium Gluconate 595 MG CAPS Take 1 capsule by mouth daily.      . propranolol (INDERAL) 10 MG tablet Take 1 tablet (10 mg total) by mouth 3 (three) times daily as needed.  60 tablet  3      Review of Systems Review of Systems  Constitutional: Negative for fever, appetite change, fatigue and unexpected weight change. pos for chills and malaise Eyes: Negative for pain and visual disturbance.  ENT pos for congestion and ST and sinus pain  Respiratory: Negative for wheeze  and shortness of breath.   Cardiovascular: Negative for cp or palpitations    Gastrointestinal: Negative for nausea, diarrhea and constipation.  Genitourinary: Negative for urgency and frequency.  Skin: Negative for pallor or rash   Neurological: Negative for weakness, light-headedness, numbness and headaches.  Hematological: Negative for adenopathy. Does not bruise/bleed easily.  Psychiatric/Behavioral:  Negative for dysphoric mood. The patient is not nervous/anxious.  Objective:   Physical Exam  Constitutional: She appears well-developed and well-nourished. No distress.  HENT:  Head: Normocephalic and atraumatic.  Right Ear: External ear normal.  Left Ear: External ear normal.  Mouth/Throat: Oropharynx is clear and moist. No oropharyngeal exudate.       Nares are injected and congested  bilat maxillary sinus tenderness worse on the R Clear post nasal drip  Eyes: Conjunctivae normal and EOM are normal. Pupils are equal, round, and reactive to light. Right eye exhibits no discharge. Left eye exhibits no discharge.  Neck: Normal range of motion. Neck supple. No JVD present. No thyromegaly present.  Cardiovascular: Normal rate and regular rhythm.   Pulmonary/Chest: Effort normal and breath sounds normal. No respiratory distress. She has no wheezes. She has no rales.  Lymphadenopathy:    She has no cervical adenopathy.  Neurological: She is alert. She has normal reflexes. No cranial nerve deficit.  Skin: Skin is warm and dry. No rash noted.  Psychiatric: She has a normal mood and affect.          Assessment & Plan:

## 2012-06-14 ENCOUNTER — Telehealth: Payer: Self-pay | Admitting: Family Medicine

## 2012-06-14 MED ORDER — PREDNISONE 10 MG PO TABS: ORAL_TABLET | ORAL | Status: DC

## 2012-06-14 NOTE — Telephone Encounter (Signed)
Caller: Claire Strong/Patient; Phone: [phone]; Reason for Call: Pt.  States that she was seen on Friday, 12/27 and on Augmentin but was told to call back if she started wheezing so Prednisone could be called in.  She is wheezing.  Uses [facility] in [place].

## 2012-06-14 NOTE — Telephone Encounter (Signed)
Pt notified Rx was sent in and to f/u if sxs worsen or don't improve with prednisone

## 2012-06-14 NOTE — Telephone Encounter (Signed)
Let her know I sent it electronically F/u if worse or no imp

## 2012-06-24 ENCOUNTER — Ambulatory Visit: Payer: Self-pay | Admitting: Otolaryngology

## 2012-06-26 ENCOUNTER — Other Ambulatory Visit: Payer: Self-pay | Admitting: Cardiovascular Disease

## 2012-06-26 ENCOUNTER — Other Ambulatory Visit: Payer: Self-pay | Admitting: Family Medicine

## 2012-06-28 ENCOUNTER — Other Ambulatory Visit: Payer: Self-pay | Admitting: *Deleted

## 2012-06-28 MED ORDER — FUROSEMIDE 20 MG PO TABS: 20.0000 mg | ORAL_TABLET | Freq: Every day | ORAL | Status: DC | PRN

## 2012-06-28 NOTE — Telephone Encounter (Signed)
Refilled Lasix

## 2012-07-13 ENCOUNTER — Encounter: Payer: Self-pay | Admitting: Family Medicine

## 2012-07-26 ENCOUNTER — Encounter: Payer: Self-pay | Admitting: Cardiovascular Disease

## 2012-07-26 ENCOUNTER — Ambulatory Visit (INDEPENDENT_AMBULATORY_CARE_PROVIDER_SITE_OTHER): Payer: BC Managed Care – PPO | Admitting: Cardiovascular Disease

## 2012-07-26 VITALS — BP 122/80 | HR 61 | Ht 70.0 in | Wt 241.8 lb

## 2012-07-26 DIAGNOSIS — R000 Tachycardia, unspecified: Secondary | ICD-10-CM

## 2012-07-26 DIAGNOSIS — I10 Essential (primary) hypertension: Secondary | ICD-10-CM

## 2012-07-26 DIAGNOSIS — R002 Palpitations: Secondary | ICD-10-CM

## 2012-07-26 MED ORDER — METOPROLOL TARTRATE 25 MG PO TABS: 37.5000 mg | ORAL_TABLET | Freq: Two times a day (BID) | ORAL | Status: DC

## 2012-07-26 MED ORDER — PROPRANOLOL HCL 20 MG PO TABS: 20.0000 mg | ORAL_TABLET | ORAL | Status: DC | PRN

## 2012-07-26 NOTE — Progress Notes (Signed)
Patient ID: Claire Strong, female    DOB: 1960/10/31, 52 y.o.   MRN: [id number]  HPI Comments: 52 year old woman with h/o lupus anticoagulant with remote DVT, HTN, horseshoe kidney, asthma and multinodular goiter s/p thyroidectomy,  s/p roux en y gastric bypass,  post surgical hypertensive/palpitation episode associated with severe HA seen at [facility] ER, who presents for routine evaluation.she has a long history of palpitations, atrial tachycardia.  prior Holter monitor showed atrial tachycardia. She has been taking metoprolol 25 mg twice a day. sometimesshe takes propranolol. She does report having breakthrough tachycardia. She does not feel that the metoprolol is working as well anymore. Episodes have been every other day lasting for several minutes at a time described as a tachycardia. She has chronic knee pain. Weight has been increasing.  History of brain surgery on the left.  TSH recently 0.5  Father with CAD and CABG at 55 yo, was smoker.  EKG shows normal sinus rhythm with rate 61 beats per minute with no significant ST or T wave changes She reports that she is on benazepril for previous history of flushing       Outpatient Encounter Prescriptions as of 07/26/2012  Medication Sig Dispense Refill  . ACETAMINOPHEN-BUTALBITAL 50-325 MG TABS Take 1 tablet by mouth every 4 (four) hours.      Marland Kitchen albuterol (PROVENTIL HFA;VENTOLIN HFA) 108 (90 BASE) MCG/ACT inhaler Inhale 2 puffs into the lungs every 6 (six) hours as needed.  1 Inhaler  11  . ALLERGIST TRAY 1/2CC 27GX1/2" 27G X 1/2" 0.5 ML KIT once a week.       Marland Kitchen amoxicillin-clavulanate (AUGMENTIN) 875-125 MG per tablet Take 1 tablet by mouth 2 (two) times daily.  14 tablet  0  . aspirin 81 MG tablet Take 81 mg by mouth daily.      . B Complex-C (SUPER B COMPLEX PO) Take 1 capsule by mouth daily.        . benazepril (LOTENSIN) 10 MG tablet TAKE ONE TABLET BY MOUTH EVERY DAY  90 tablet  3  . calcium carbonate (OS-CAL) 600 MG TABS Take 600 mg by  mouth daily.      . carisoprodol (SOMA) 350 MG tablet TAKE ONE TABLET BY MOUTH AT BEDTIME  60 tablet  0  . citalopram (CELEXA) 20 MG tablet Take 1.5 tablets (30 mg total) by mouth daily.  45 tablet  6  . cyanocobalamin (,VITAMIN B-12,) 1000 MCG/ML injection Inject 1 mL (1,000 mcg total) into the muscle every 30 (thirty) days.  10 mL  0  . EPINEPHrine (EPI-PEN) 0.3 mg/0.3 mL DEVI Inject 0.3 mLs (0.3 mg total) into the muscle once.  1 Device  2  . Fluticasone-Salmeterol (ADVAIR DISKUS) 100-50 MCG/DOSE AEPB Inhale 1 puff into the lungs every 12 (twelve) hours.  60 each  6  . furosemide (LASIX) 20 MG tablet Take 1 tablet (20 mg total) by mouth daily as needed.  30 tablet  4  . levothyroxine (SYNTHROID, LEVOTHROID) 75 MCG tablet Take 75 mcg by mouth daily.      Marland Kitchen levothyroxine (SYNTHROID, LEVOTHROID) 88 MCG tablet Take 88 mcg by mouth daily.      Marland Kitchen loratadine (CLARITIN) 10 MG tablet Take 10 mg by mouth daily.      . Magnesium 250 MG TABS Take 1 tablet by mouth daily.      . metoprolol tartrate (LOPRESSOR) 25 MG tablet Take 1.5 tablets (37.5 mg total) by mouth 2 (two) times daily.  270 tablet  3  .  montelukast (SINGULAIR) 10 MG tablet Take 10 mg by mouth at bedtime.        . Multiple Vitamin (MULTIVITAMIN) tablet Take 1 tablet by mouth daily.        . ondansetron (ZOFRAN) 4 MG tablet Take 4 mg by mouth every 8 (eight) hours as needed.       . Potassium Gluconate 595 MG CAPS Take 1 capsule by mouth daily.      . predniSONE (DELTASONE) 10 MG tablet Take 3 pills once daily by mouth for 3 days, then 2 pills once daily for 3 days, then 1 pill once daily for 3 days and then stop  18 tablet  0  . propranolol (INDERAL) 20 MG tablet Take 1 tablet (20 mg total) by mouth as needed.  30 tablet  6  . [DISCONTINUED] metoprolol tartrate (LOPRESSOR) 25 MG tablet Take 1 tablet (25 mg total) by mouth 2 (two) times daily.  60 tablet  6  . [DISCONTINUED] propranolol (INDERAL) 10 MG tablet Take 1 tablet (10 mg total) by  mouth 3 (three) times daily as needed.  60 tablet  3  . [DISCONTINUED] propranolol (INDERAL) 10 MG tablet Take 10 mg by mouth as needed.      . Cholecalciferol (VITAMIN D3) 1000 UNITS tablet Take 2,000 Units by mouth daily.         Review of Systems  HENT: Negative.   Eyes: Negative.   Respiratory: Negative.   Cardiovascular: Positive for palpitations.  Gastrointestinal: Negative.   Musculoskeletal: Negative.   Skin: Negative.   Neurological: Negative.   Psychiatric/Behavioral: Negative.   All other systems reviewed and are negative.    BP 122/80  Pulse 61  Ht 5\' 10"  (1.778 m)  Wt 241 lb 12 oz (109.657 kg)  BMI 34.69 kg/m2  Physical Exam  Nursing note and vitals reviewed. Constitutional: She is oriented to person, place, and time. She appears well-developed and well-nourished.  Obese  HENT:  Head: Normocephalic.  Nose: Nose normal.  Mouth/Throat: Oropharynx is clear and moist.  Eyes: Conjunctivae are normal. Pupils are equal, round, and reactive to light.  Neck: Normal range of motion. Neck supple. No JVD present.  Well healed surgical incision site from thyroidectomy  Cardiovascular: Normal rate, regular rhythm, S1 normal, S2 normal, normal heart sounds and intact distal pulses.  Exam reveals no gallop and no friction rub.   No murmur heard. Pulmonary/Chest: Effort normal and breath sounds normal. No respiratory distress. She has no wheezes. She has no rales. She exhibits no tenderness.  Abdominal: Soft. Bowel sounds are normal. She exhibits no distension. There is no tenderness.  Musculoskeletal: Normal range of motion. She exhibits no edema and no tenderness.  Lymphadenopathy:    She has no cervical adenopathy.  Neurological: She is alert and oriented to person, place, and time. Coordination normal.  Skin: Skin is warm and dry. No rash noted. No erythema.  Psychiatric: She has a normal mood and affect. Her behavior is normal. Judgment and thought content normal.     Assessment and Plan

## 2012-07-26 NOTE — Assessment & Plan Note (Signed)
Blood pressure well controlled. It by using more metoprolol her blood pressure dropped, we have suggested she hold her ACE inhibitor periodically.

## 2012-07-26 NOTE — Patient Instructions (Addendum)
You are doing well. Please increase the metoprolol to 1 1/2 in the morning and 1 pill in the PM  Please call us if you have new issues that need to be addressed before your next appt.  Your physician wants you to follow-up in: 6 months.  You will receive a reminder letter in the mail two months in advance. If you don't receive a letter, please call our office to schedule the follow-up appointment.

## 2012-07-26 NOTE — Assessment & Plan Note (Signed)
We have suggested she increase the metoprolol in the morning to 37.5 mg with 25 mg in the afternoon and evening. If this does not hold her atrial tachycardia, we have suggested she try 50 mg in the morning with 25 mg at night. She can use propranolol when necessary. We will try to avoid using antiarrhythmic medications. She does have stressors at home.

## 2012-08-30 ENCOUNTER — Other Ambulatory Visit: Payer: Self-pay | Admitting: Family Medicine

## 2012-08-30 NOTE — Telephone Encounter (Signed)
Rx called in as directed.   

## 2012-08-30 NOTE — Telephone Encounter (Signed)
plz phone in. 

## 2012-09-14 ENCOUNTER — Encounter: Payer: Self-pay | Admitting: Family Medicine

## 2012-09-14 ENCOUNTER — Ambulatory Visit (INDEPENDENT_AMBULATORY_CARE_PROVIDER_SITE_OTHER): Payer: BC Managed Care – PPO | Admitting: Family Medicine

## 2012-09-14 VITALS — BP 114/84 | HR 59 | Temp 98.3°F | Wt 249.2 lb

## 2012-09-14 DIAGNOSIS — R229 Localized swelling, mass and lump, unspecified: Secondary | ICD-10-CM

## 2012-09-14 MED ORDER — BENAZEPRIL HCL 10 MG PO TABS: ORAL_TABLET | ORAL | Status: DC

## 2012-09-14 NOTE — Patient Instructions (Signed)
Stop the benazepril for now.  I would presume this is from the benazepril.  Call back with an update in a few days.  If you get much more swelling, short of breath, or sudden trouble swallowing then go to the ER.  Take care.

## 2012-09-14 NOTE — Progress Notes (Signed)
Had thyroid out 1 year ago.  Now with sx starting in the last week, swelling just lateral to the base of the neck anteriorly.  She noted dysphagia today, to solids but not liquids.  The areas of swelling/puffiness are sore. No redness. No meds changes.  Compliant with a day of thyroid replacement.  No FCANVD. No voice changes other than mild hoarseness that she was attributing to allergies in springtime. Not SOB. No other recent changes or other sx o/w- no sig change in baseline BLE edema. On ACE.   Meds, vitals, and allergies reviewed.   ROS: See HPI.  Otherwise, noncontributory.  nad Ncat Tm wnl Nasal and Op exam wnl 6cm area of tender puffiness just superior to the medial portion of the B clavicles but neither appears to involve the SCM No stridor Thyroid surgically absent No LA rrr ctab

## 2012-09-14 NOTE — Telephone Encounter (Signed)
Seen by my partner Dr. Algis Downs today.

## 2012-09-15 DIAGNOSIS — R229 Localized swelling, mass and lump, unspecified: Secondary | ICD-10-CM | POA: Insufficient documentation

## 2012-09-15 NOTE — Assessment & Plan Note (Signed)
With no stridor.  Has equal pulses.  No arm edema.  On ACE.  No lip or tongue edema.  Possible angioedema.  No other clear cause seen.  Would stop ACE, presumptively.  D/w PCP and he agrees.  Pt agrees.  Will follow BP and her have call back with update.  If any lip/tongue/airway sx then to ER.  She agrees.

## 2012-09-17 ENCOUNTER — Telehealth: Payer: Self-pay | Admitting: *Deleted

## 2012-09-17 ENCOUNTER — Encounter: Payer: Self-pay | Admitting: Family Medicine

## 2012-09-17 ENCOUNTER — Ambulatory Visit (INDEPENDENT_AMBULATORY_CARE_PROVIDER_SITE_OTHER)
Admission: RE | Admit: 2012-09-17 | Discharge: 2012-09-17 | Disposition: A | Discharge status: Home / Self Care | Payer: BC Managed Care – PPO | Source: Ambulatory Visit | Attending: Family Medicine | Admitting: Family Medicine

## 2012-09-17 ENCOUNTER — Ambulatory Visit (INDEPENDENT_AMBULATORY_CARE_PROVIDER_SITE_OTHER): Payer: BC Managed Care – PPO | Admitting: Family Medicine

## 2012-09-17 VITALS — BP 100/78 | HR 58 | Temp 98.0°F | Ht 69.25 in | Wt 250.8 lb

## 2012-09-17 DIAGNOSIS — R609 Edema, unspecified: Secondary | ICD-10-CM

## 2012-09-17 DIAGNOSIS — R079 Chest pain, unspecified: Secondary | ICD-10-CM

## 2012-09-17 DIAGNOSIS — I10 Essential (primary) hypertension: Secondary | ICD-10-CM

## 2012-09-17 LAB — POCT URINALYSIS DIPSTICK
Blood, UA: NEGATIVE
Glucose, UA: NEGATIVE
Leukocytes, UA: NEGATIVE
Nitrite, UA: NEGATIVE
Urobilinogen, UA: 0.2

## 2012-09-17 LAB — BASIC METABOLIC PANEL
BUN: 16 mg/dL (ref 6–23)
Calcium: 8.4 mg/dL (ref 8.4–10.5)
GFR: 65.09 mL/min (ref >60.00)
Glucose, Bld: 97 mg/dL (ref 70–99)
Sodium: 137 mEq/L (ref 135–145)

## 2012-09-17 LAB — CBC WITH DIFFERENTIAL/PLATELET
Basophils Absolute: 0 10*3/uL (ref 0.0–0.1)
Eosinophils Relative: 2.5 % (ref 0.0–5.0)
Lymphocytes Relative: 30.9 % (ref 12.0–46.0)
Monocytes Relative: 6.5 % (ref 3.0–12.0)
Platelets: 201 10*3/uL (ref 150.0–400.0)
RDW: 12.4 % (ref 11.5–14.6)
WBC: 6.4 10*3/uL (ref 4.5–10.5)

## 2012-09-17 LAB — MICROALBUMIN / CREATININE URINE RATIO
Creatinine,U: 42.3 mg/dL
Microalb, Ur: 0.3 mg/dL (ref 0.0–1.9)

## 2012-09-17 LAB — D-DIMER, QUANTITATIVE: D-Dimer, Quant: 0.41 ug/mL-FEU (ref 0.00–0.48)

## 2012-09-17 MED ORDER — AMLODIPINE BESYLATE 5 MG PO TABS: 5.0000 mg | ORAL_TABLET | Freq: Every day | ORAL | Status: DC

## 2012-09-17 NOTE — Assessment & Plan Note (Signed)
Monitor bp currently off benazepril given concern for angioedema- however I doubt this med is contributing. If persistently elevated bp at home, rec start amlodipine (sent to pharmacy).

## 2012-09-17 NOTE — Assessment & Plan Note (Addendum)
Nonspecific chest pain today - doubt cardiac in nature. Given h/o lupus anticoagulant positive, nonspecific chest pain associated with cough, will obtain D dimer stat - discussed if positive would necessitate CT chest to r/o blood clot.  Obesity contributes to risk. Check CXR today - clear on my read.  EKG - sinus brady at rate of 51, normal axis, intervals, no hypertrophy or acute ST/T changes.

## 2012-09-17 NOTE — Patient Instructions (Addendum)
I'm not sure what's causing this swelling. Let's get blood work, EKG and chest xray today. We have also checked your urine today. Overall normal. May start amlodipine if persistently elevated blood pressures (today looking ok).

## 2012-09-17 NOTE — Telephone Encounter (Signed)
Appt scheduled for today.

## 2012-09-17 NOTE — Telephone Encounter (Signed)
[facility] called-STAT D-Dimer results 0.41. Given to Dr. Reece Agar

## 2012-09-17 NOTE — Progress Notes (Signed)
  Subjective:    Patient ID: Claire Strong, female    DOB: Aug 03, 1960, 52 y.o.   MRN: [id number]  HPI CC: multiple issues  Seen earlier this week by Dr Damita Dunnings with concern for supraclavicular neck swelling, thought possibly ACEI related angioedema so benazepril held.  This hasn't really helped.  Continued upper extremity swelling endorsed "from waist up". Endorses chest discomfort described as pressure that started 3 d ago.  Constant discomfort.  No nausea, significant dyspnea.  No increased chest pain with walking to work (thinks walks 1.5 mi/day).  No improvement with rest.  No improvement with leaning forward.  Mild cough. No new medicines, no recent steroid use.  Denies any leg swelling, no urinary changes, no palpitations.  No significant arthralgias. Not on hormonal meds.  No recent prolonged immobility.  Commutes ~30 min daily.  R nosebleed this morning that lasted 2 hours.  Blood pressure readings at home ranging 139-141/89-98 - higher than normal. H/o atrial tachycardia/palpitations, currently well controlled on metoprolol 37.5mg  bid. Taking lasix 20mg  daily.  Wt Readings from Last 3 Encounters:  09/17/12 250 lb 12 oz (113.739 kg)  09/14/12 249 lb 4 oz (113.059 kg)  07/26/12 241 lb 12 oz (109.657 kg)    Past Medical History  Diagnosis Date  . Lupus anticoagulant disorder   . Arthritis   . Depression   . HTN (hypertension)   . Kidney stones     s/p renal hematoma after lithotripsy  . Horseshoe kidney     sole, R kidney damage  . 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 Frutoso Chase  . Ulcer 2009  . History of DVT (deep vein thrombosis)     after 1st pregnancy  . Nasal septal perforation     chronic, ENT rec avoid antihistamine, INS  . Seasonal allergies     allergy shots, singulair  . Multinodular goiter     s/p thyroidectomy  . Bronchial asthma   . Post-surgical hypothyroidism     for multinodular goiter  . Diverticulosis     mild by colonoscopy  . 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 Gigi Gin ortho)     Review of Systems Per HPI    Objective:   Physical Exam  Nursing note and vitals reviewed. Constitutional: She appears well-developed and well-nourished. No distress.  HENT:  Head: Normocephalic and atraumatic.  Mouth/Throat: Oropharynx is clear and moist. No oropharyngeal exudate.  Eyes: Conjunctivae and EOM are normal. Pupils are equal, round, and reactive to light. No scleral icterus.  Neck: Normal range of motion. Neck supple. No JVD present.  Thyroid absent  Cardiovascular: Normal rate, regular rhythm, normal heart sounds and intact distal pulses.   No murmur heard. Pulmonary/Chest: Effort normal and breath sounds normal. No respiratory distress. She has no wheezes. She has no rales. She exhibits no tenderness.  Musculoskeletal: She exhibits edema (nonpitting bilateral upper and lower extremities).  Lymphadenopathy:    She has no cervical adenopathy.  Skin: Skin is warm and dry. No rash noted.  Psychiatric: She has a normal mood and affect.       Assessment & Plan:

## 2012-09-17 NOTE — Telephone Encounter (Signed)
Released blood work via [employer].

## 2012-09-17 NOTE — Telephone Encounter (Signed)
See my chart note.

## 2012-09-17 NOTE — Assessment & Plan Note (Signed)
LE edema prior thought related to CVI.   Currently endorsing upper extremity edema, on exam does have evident nonpitting edema throughout.   Unclear etiology of this - check for proteinuria as well as blood work today to further eval this. She is already taking lasix daily although this has been prescribed on prn basis.

## 2012-09-21 ENCOUNTER — Ambulatory Visit: Payer: Self-pay | Admitting: Otolaryngology

## 2012-09-21 LAB — TSH: Thyroid Stimulating Horm: 0.452 u[IU]/mL

## 2012-09-21 LAB — T4, FREE: Free Thyroxine: 1.25 ng/dL (ref 0.76–1.46)

## 2012-09-22 ENCOUNTER — Encounter: Payer: Self-pay | Admitting: Family Medicine

## 2012-09-22 MED ORDER — BENAZEPRIL HCL 10 MG PO TABS: ORAL_TABLET | ORAL | Status: DC

## 2012-09-22 NOTE — Telephone Encounter (Signed)
See My chart message

## 2012-10-25 ENCOUNTER — Other Ambulatory Visit: Payer: Self-pay | Admitting: Family Medicine

## 2012-10-25 NOTE — Telephone Encounter (Signed)
plz phone in. 

## 2012-10-25 NOTE — Telephone Encounter (Signed)
Ok to refill 

## 2012-10-26 NOTE — Telephone Encounter (Signed)
Rx called in as directed.   

## 2012-11-21 ENCOUNTER — Encounter: Payer: Self-pay | Admitting: Family Medicine

## 2012-11-30 ENCOUNTER — Other Ambulatory Visit: Payer: Self-pay | Admitting: Cardiovascular Disease

## 2012-12-13 ENCOUNTER — Encounter: Payer: Self-pay | Admitting: Family Medicine

## 2012-12-13 MED ORDER — CITALOPRAM HYDROBROMIDE 40 MG PO TABS: 40.0000 mg | ORAL_TABLET | Freq: Every day | ORAL | Status: DC

## 2012-12-14 ENCOUNTER — Encounter: Payer: Self-pay | Admitting: Family Medicine

## 2012-12-14 ENCOUNTER — Ambulatory Visit (INDEPENDENT_AMBULATORY_CARE_PROVIDER_SITE_OTHER): Payer: BC Managed Care – PPO | Admitting: Family Medicine

## 2012-12-14 ENCOUNTER — Encounter: Payer: Self-pay | Admitting: Radiology

## 2012-12-14 VITALS — BP 110/74 | HR 68 | Temp 98.2°F | Wt 257.5 lb

## 2012-12-14 DIAGNOSIS — M7989 Other specified soft tissue disorders: Secondary | ICD-10-CM | POA: Insufficient documentation

## 2012-12-14 LAB — CBC WITH DIFFERENTIAL/PLATELET
Basophils Absolute: 0 10*3/uL (ref 0.0–0.1)
Basophils Relative: 0.3 % (ref 0.0–3.0)
Eosinophils Absolute: 0.1 10*3/uL (ref 0.0–0.7)
Hemoglobin: 12.8 g/dL (ref 12.0–15.0)
Lymphocytes Relative: 28.8 % (ref 12.0–46.0)
MCHC: 33.7 g/dL (ref 30.0–36.0)
MCV: 87.5 fl (ref 78.0–100.0)
Monocytes Absolute: 0.2 10*3/uL (ref 0.1–1.0)
Neutro Abs: 4.3 10*3/uL (ref 1.4–7.7)
Neutrophils Relative %: 65.3 % (ref 43.0–77.0)
RBC: 4.34 Mil/uL (ref 3.87–5.11)
RDW: 12.5 % (ref 11.5–14.6)

## 2012-12-14 NOTE — Assessment & Plan Note (Addendum)
Unclear etiology.  Recent eval for edema unrevealing.  Not on any meds to cause this. Check CBC and CRP for evidence of cellulitis, check D dimer to help r/o RUE DVT. Lab work results will guide treatment plan. Red flags to seek urgent care discussed (dyspnea, chest pain).

## 2012-12-14 NOTE — Patient Instructions (Signed)
Blood work today. We will call you with results and plan. No changes to medicines for now. Good to see you, call us with quesitons.

## 2012-12-14 NOTE — Progress Notes (Signed)
  Subjective:    Patient ID: Claire Strong, female    DOB: [birth year], 52 y.o.   MRN: [id number]  HPI CC: R arm swelling   Noticed R arm swelling 3d ago.  Swelling mainly lateral elbow and some proximal arm. Noticed some redness on right lateral arm as well.  Very tender at lateral arm and underarm as well. Shoulder motion intact.  Not on hormonal meds.  No recent travel.  Denies breast mass. No recent bug bites or skin infections.  H/o lupus anticoagulant. Has recently been working Geneticist, molecular.  Past Medical History  Diagnosis Date  . Lupus anticoagulant disorder   . Arthritis   . Depression   . HTN (hypertension)   . Kidney stones     s/p renal hematoma after lithotripsy  . Horseshoe kidney     sole, R kidney damage  . 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 Jacqulyn Ducking  . Ulcer 2009  . History of DVT (deep vein thrombosis)     after 1st pregnancy  . Nasal septal perforation     chronic, ENT rec avoid antihistamine, INS  . Seasonal allergies     allergy shots, singulair  . Multinodular goiter     s/p thyroidectomy  . Bronchial asthma   . Post-surgical hypothyroidism     for multinodular goiter  . Diverticulosis     mild by colonoscopy  . 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 Trula Ore ortho)  . Chronic venous insufficiency 2007    s/p vein stripping and laser ablation     Review of Systems Per HPI    Objective:   Physical Exam  Nursing note and vitals reviewed. Constitutional: She appears well-developed and well-nourished. No distress.  Cardiovascular: Normal rate, regular rhythm, normal heart sounds and intact distal pulses.   No murmur heard. Pulmonary/Chest: Effort normal and breath sounds normal. No respiratory distress. She has no wheezes. She has no rales.  Musculoskeletal: She exhibits no edema.  Nonpitting edema more evident on right arm L arm 42cm circ R arm 45cm circ Erythema but no warmth present lateral  elbow as well as medial upper arm More pronounced veins on RUE  Skin: Skin is warm and dry. No rash noted.  Small bruise post R forearm       Assessment & Plan:

## 2012-12-15 ENCOUNTER — Other Ambulatory Visit: Payer: Self-pay | Admitting: Family Medicine

## 2012-12-15 DIAGNOSIS — M7989 Other specified soft tissue disorders: Secondary | ICD-10-CM

## 2012-12-15 DIAGNOSIS — D6862 Lupus anticoagulant syndrome: Secondary | ICD-10-CM

## 2012-12-15 MED ORDER — DICLOFENAC SODIUM 1 % TD GEL: 1.0000 "application " | Freq: Three times a day (TID) | TRANSDERMAL | Status: DC

## 2012-12-16 ENCOUNTER — Ambulatory Visit: Payer: Self-pay | Admitting: Family Medicine

## 2012-12-16 ENCOUNTER — Telehealth: Payer: Self-pay | Admitting: *Deleted

## 2012-12-16 NOTE — Telephone Encounter (Signed)
Parker @ [facility] [place] called and said [place] negative for DVT. Patient has left and she will fax report as soon as it is available. She did say it is noticeably swollen at the crease of patient's elbow, but there was nothing showing on [place].

## 2012-12-20 ENCOUNTER — Ambulatory Visit: Payer: Self-pay | Admitting: Otolaryngology

## 2012-12-20 ENCOUNTER — Encounter: Payer: Self-pay | Admitting: Family Medicine

## 2012-12-20 LAB — TSH: Thyroid Stimulating Horm: 0.374 u[IU]/mL — ABNORMAL LOW

## 2012-12-20 LAB — T4, FREE: Free Thyroxine: 1.36 ng/dL (ref 0.76–1.46)

## 2012-12-20 NOTE — Telephone Encounter (Signed)
Patient left message on voicemail wanting results of ultrasound.

## 2012-12-20 NOTE — Telephone Encounter (Signed)
Released via mychart

## 2012-12-21 ENCOUNTER — Telehealth: Payer: Self-pay

## 2012-12-21 ENCOUNTER — Encounter: Payer: Self-pay | Admitting: Family Medicine

## 2012-12-21 NOTE — Telephone Encounter (Signed)
Prior auth needed for voltaren gel. Form in Dr Timoteo Expose in box.

## 2012-12-22 NOTE — Telephone Encounter (Signed)
Filled and placed in kim's box. 

## 2012-12-22 NOTE — Telephone Encounter (Signed)
Pa faxed. Will await determination.

## 2012-12-23 NOTE — Telephone Encounter (Signed)
Additional information requested by [employer]; form in Dr Timoteo Expose in box.

## 2012-12-27 NOTE — Telephone Encounter (Signed)
Completed form faxed to [phone] as instructed.

## 2012-12-27 NOTE — Telephone Encounter (Signed)
Filled and placed in my out box. 

## 2012-12-28 NOTE — Telephone Encounter (Signed)
Approval letter received, faxed to [facility] and on Dr Timoteo Expose desk for signature and scanning.

## 2013-01-08 ENCOUNTER — Other Ambulatory Visit: Payer: Self-pay | Admitting: Family Medicine

## 2013-01-08 NOTE — Telephone Encounter (Signed)
plz phone in. 

## 2013-01-10 ENCOUNTER — Other Ambulatory Visit: Payer: Self-pay | Admitting: Family Medicine

## 2013-01-10 NOTE — Telephone Encounter (Signed)
Rx called in as directed.   

## 2013-01-14 ENCOUNTER — Encounter: Payer: Self-pay | Admitting: Family Medicine

## 2013-01-25 ENCOUNTER — Encounter: Payer: Self-pay | Admitting: Cardiovascular Disease

## 2013-01-25 ENCOUNTER — Ambulatory Visit (INDEPENDENT_AMBULATORY_CARE_PROVIDER_SITE_OTHER): Payer: BC Managed Care – PPO | Admitting: Cardiovascular Disease

## 2013-01-25 VITALS — BP 122/84 | HR 57 | Ht 70.0 in | Wt 256.5 lb

## 2013-01-25 DIAGNOSIS — I498 Other specified cardiac arrhythmias: Secondary | ICD-10-CM

## 2013-01-25 DIAGNOSIS — R609 Edema, unspecified: Secondary | ICD-10-CM

## 2013-01-25 DIAGNOSIS — I471 Supraventricular tachycardia: Secondary | ICD-10-CM | POA: Insufficient documentation

## 2013-01-25 DIAGNOSIS — I10 Essential (primary) hypertension: Secondary | ICD-10-CM

## 2013-01-25 DIAGNOSIS — R079 Chest pain, unspecified: Secondary | ICD-10-CM

## 2013-01-25 NOTE — Assessment & Plan Note (Signed)
Blood pressure is well controlled on today's visit. No changes made to the medications. 

## 2013-01-25 NOTE — Assessment & Plan Note (Signed)
Well-controlled on current dose of metoprolol

## 2013-01-25 NOTE — Patient Instructions (Addendum)
You are doing well. No medication changes were made.  Please call us if you have new issues that need to be addressed before your next appt.  Your physician wants you to follow-up in: 12 months.  You will receive a reminder letter in the mail two months in advance. If you don't receive a letter, please call our office to schedule the follow-up appointment. 

## 2013-01-25 NOTE — Progress Notes (Signed)
Patient ID: Claire Strong, female    DOB: 1961-03-22, 52 y.o.   MRN: [id number]  HPI Comments: 52 year old woman with h/o lupus anticoagulant with remote DVT, HTN, horseshoe kidney, asthma and multinodular goiter s/p thyroidectomy,  s/p roux en y gastric bypass,  post surgical hypertensive/palpitation episode associated with severe HA seen at [facility] ER, who presents for routine evaluation.she has a long history of palpitations, atrial tachycardia.  prior Holter monitor showed atrial tachycardia. She has been taking metoprolol 25 mg twice a day with good control of her arrhythmia . Sometimes she takes propranolol. Rare episodes of breakthrough tachycardia. Her weight continues to be an issue. Leg swelling is worse when weight is higher. She has chronic knee pain. History of varicose veins.   History of brain surgery on the left.   She does report pain in the left pectoral region, exaggerated with palpation.  Father with CAD and CABG at 87 yo, was smoker.  EKG shows normal sinus rhythm with rate 57 beats per minute with no significant ST or T wave changes       Outpatient Encounter Prescriptions as of 01/25/2013  Medication Sig Dispense Refill  . ACETAMINOPHEN-BUTALBITAL 50-325 MG TABS Take 1 tablet by mouth every 4 (four) hours.      Marland Kitchen albuterol (PROVENTIL HFA;VENTOLIN HFA) 108 (90 BASE) MCG/ACT inhaler Inhale 2 puffs into the lungs every 6 (six) hours as needed.  1 Inhaler  11  . ALLERGIST TRAY 1/2CC 27GX1/2" 27G X 1/2" 0.5 ML KIT once a week.       Marland Kitchen aspirin 81 MG tablet Take 81 mg by mouth daily.      . B Complex-C (SUPER B COMPLEX PO) Take 1 capsule by mouth daily.        . benazepril (LOTENSIN) 10 MG tablet TAKE ONE TABLET BY MOUTH EVERY DAY  90 tablet  3  . calcium carbonate (OS-CAL) 600 MG TABS Take 600 mg by mouth daily.      . carisoprodol (SOMA) 350 MG tablet TAKE ONE TABLET BY MOUTH AT BEDTIME  60 tablet  0  . Cholecalciferol (VITAMIN D3) 1000 UNITS tablet Take 2,000 Units by mouth  daily.       . citalopram (CELEXA) 40 MG tablet Take 1 tablet (40 mg total) by mouth daily.  30 tablet  11  . cyanocobalamin (,VITAMIN B-12,) 1000 MCG/ML injection Inject 1 mL (1,000 mcg total) into the muscle every 30 (thirty) days.  10 mL  0  . diclofenac sodium (VOLTAREN) 1 % GEL Apply 1 application topically 3 (three) times daily.  1 Tube  1  . EPINEPHrine (EPI-PEN) 0.3 mg/0.3 mL DEVI Inject 0.3 mLs (0.3 mg total) into the muscle once.  1 Device  2  . Fluticasone-Salmeterol (ADVAIR DISKUS) 100-50 MCG/DOSE AEPB Inhale 1 puff into the lungs every 12 (twelve) hours.  60 each  6  . furosemide (LASIX) 20 MG tablet Take 20 mg by mouth daily.      Marland Kitchen levothyroxine (SYNTHROID, LEVOTHROID) 75 MCG tablet Take 75 mcg by mouth daily. With 66mcg      . levothyroxine (SYNTHROID, LEVOTHROID) 88 MCG tablet Take 88 mcg by mouth daily. With 46mcg      . loratadine (CLARITIN) 10 MG tablet Take 10 mg by mouth daily.      . Magnesium 250 MG TABS Take 1 tablet by mouth daily.      . metoprolol tartrate (LOPRESSOR) 25 MG tablet Take 1.5 tablets (37.5 mg total) by mouth 2 (two)  times daily.  270 tablet  3  . montelukast (SINGULAIR) 10 MG tablet Take 10 mg by mouth at bedtime.        . Multiple Vitamin (MULTIVITAMIN) tablet Take 1 tablet by mouth daily.        . ondansetron (ZOFRAN) 4 MG tablet Take 4 mg by mouth every 8 (eight) hours as needed.       . Potassium Gluconate 595 MG CAPS Take 1 capsule by mouth daily.      . propranolol (INDERAL) 20 MG tablet Take 1 tablet (20 mg total) by mouth as needed.  30 tablet  6  . [DISCONTINUED] furosemide (LASIX) 20 MG tablet TAKE ONE TABLET BY MOUTH EVERY DAY AS NEEDED  30 tablet  2   No facility-administered encounter medications on file as of 01/25/2013.    Review of Systems  HENT: Negative.   Eyes: Negative.   Respiratory: Negative.        Chest wall pain  Gastrointestinal: Negative.   Musculoskeletal: Negative.   Skin: Negative.   Neurological: Negative.    Psychiatric/Behavioral: Negative.   All other systems reviewed and are negative.    BP 122/84  Pulse 57  Ht 5\' 10"  (1.778 m)  Wt 256 lb 8 oz (116.348 kg)  BMI 36.8 kg/m2  SpO2 98%  Physical Exam  Nursing note and vitals reviewed. Constitutional: She is oriented to person, place, and time. She appears well-developed and well-nourished.  Obese  HENT:  Head: Normocephalic.  Nose: Nose normal.  Mouth/Throat: Oropharynx is clear and moist.  Eyes: Conjunctivae are normal. Pupils are equal, round, and reactive to light.  Neck: Normal range of motion. Neck supple. No JVD present.  Well healed surgical incision site from thyroidectomy  Cardiovascular: Normal rate, regular rhythm, S1 normal, S2 normal, normal heart sounds and intact distal pulses.  Exam reveals no gallop and no friction rub.   No murmur heard. Pulmonary/Chest: Effort normal and breath sounds normal. No respiratory distress. She has no wheezes. She has no rales. She exhibits no tenderness.  Abdominal: Soft. Bowel sounds are normal. She exhibits no distension. There is no tenderness.  Musculoskeletal: Normal range of motion. She exhibits no edema and no tenderness.  Lymphadenopathy:    She has no cervical adenopathy.  Neurological: She is alert and oriented to person, place, and time. Coordination normal.  Skin: Skin is warm and dry. No rash noted. No erythema.  Psychiatric: She has a normal mood and affect. Her behavior is normal. Judgment and thought content normal.    Assessment and Plan

## 2013-01-25 NOTE — Assessment & Plan Note (Signed)
Atypical chest pain on today's visit reproducible with palpation. No further testing needed.

## 2013-01-25 NOTE — Assessment & Plan Note (Signed)
Mild nonpitting edema. Recommended she wear compression hose if tolerated. She reports this makes her knee swelling worse.

## 2013-03-18 ENCOUNTER — Ambulatory Visit (INDEPENDENT_AMBULATORY_CARE_PROVIDER_SITE_OTHER): Payer: BC Managed Care – PPO | Admitting: Family Medicine

## 2013-03-18 ENCOUNTER — Encounter: Payer: Self-pay | Admitting: Family Medicine

## 2013-03-18 ENCOUNTER — Ambulatory Visit (INDEPENDENT_AMBULATORY_CARE_PROVIDER_SITE_OTHER)
Admission: RE | Admit: 2013-03-18 | Discharge: 2013-03-18 | Disposition: A | Discharge status: Home / Self Care | Payer: BC Managed Care – PPO | Source: Ambulatory Visit | Attending: Family Medicine | Admitting: Family Medicine

## 2013-03-18 VITALS — BP 126/96 | HR 54 | Temp 98.2°F | Ht 70.0 in | Wt 245.8 lb

## 2013-03-18 DIAGNOSIS — M79609 Pain in unspecified limb: Secondary | ICD-10-CM

## 2013-03-18 DIAGNOSIS — Z23 Encounter for immunization: Secondary | ICD-10-CM

## 2013-03-18 DIAGNOSIS — M79672 Pain in left foot: Secondary | ICD-10-CM

## 2013-03-18 DIAGNOSIS — R609 Edema, unspecified: Secondary | ICD-10-CM

## 2013-03-18 LAB — HIGH SENSITIVITY CRP: CRP, High Sensitivity: 3.07 mg/L (ref 0.000–5.000)

## 2013-03-18 LAB — BASIC METABOLIC PANEL
CO2: 28 mEq/L (ref 19–32)
Calcium: 9 mg/dL (ref 8.4–10.5)
Chloride: 105 mEq/L (ref 96–112)
Sodium: 138 mEq/L (ref 135–145)

## 2013-03-18 LAB — URIC ACID: Uric Acid, Serum: 5.3 mg/dL (ref 2.4–7.0)

## 2013-03-18 MED ORDER — TRAMADOL HCL 50 MG PO TABS: 50.0000 mg | ORAL_TABLET | Freq: Two times a day (BID) | ORAL | Status: DC | PRN

## 2013-03-18 NOTE — Assessment & Plan Note (Addendum)
2wk h/o worsening L foot pain - check xrays to r/o MT stress fracture - negative on my read.  ?arthritis.  Doubt gout but will check uric acid level today as well as CRP. Evident breakdown of anatomy in past followed by ortho at [facility] - will refer to local orthopedist per pt preference for further evaluation. In interim, treat pain with tramadol prn.

## 2013-03-18 NOTE — Progress Notes (Signed)
  Subjective:    Patient ID: Claire Strong, female    DOB: Oct 20, 1960, 52 y.o.   MRN: [id number]  HPI CC: L ankle swelling  2 wk h/o L ankle and foot swelling, with significant pain.  Main pain is at posterior sole and bilateral ankle.  No erythema, warmth. Has been babying L foot and walking with limp.  Soft tissue swelling medial midfoot. Wonders about gout.  No h/o gout.  H/o phlebitis  Has been treating with ibuprofen or aleve, not helping. Elevating leg as able. Continues to take lasix 20mg  daily.  Known cavovarus deformity of L foot.  11lb weight loss noted - trying.  Decreased portion sizes.  Wt Readings from Last 3 Encounters:  03/18/13 245 lb 12.8 oz (111.494 kg)  01/25/13 256 lb 8 oz (116.348 kg)  12/14/12 257 lb 8 oz (116.801 kg)    Past Medical History  Diagnosis Date  . Lupus anticoagulant disorder   . Arthritis   . Depression   . HTN (hypertension)   . Kidney stones     s/p renal hematoma after lithotripsy  . Horseshoe kidney     sole, R kidney damage  . 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 14/11/2004  . Ulcer 2009  . History of DVT (deep vein thrombosis)     after 1st pregnancy  . Nasal septal perforation     chronic, ENT rec avoid antihistamine, INS  . Seasonal allergies     allergy shots, singulair  . Multinodular goiter     s/p thyroidectomy  . Bronchial asthma   . Post-surgical hypothyroidism     for multinodular goiter  . Diverticulosis     mild by colonoscopy  . 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 2010 ortho)  . Chronic venous insufficiency 2007    s/p vein stripping and laser ablation    Past Surgical History  Procedure Laterality Date  . Gastric bypass  05/21/05    Dr. 14/6/06, Roux-en-Y  . Partial hysterectomy  1989    ovaries remain  . Back surgery  2009  . Cholecystectomy  1998  . Appendectomy  1978  . Tonsillectomy  1969  . Nasal septum surgery  1981    deviated septum, repaired at [facility]  .  Total thyroidectomy  09/2011    benign path (done for multinodular goiter concern for cancer)  . Colonoscopy  05/2012    hyperplastic polyps x2, mild diverticulosis 06/2012) rpt 10 yrs  . Skin surgery      basel cell  . Cyst excision      back  and shoulder    Review of Systems Per HPI    Objective:   Physical Exam  Nursing note and vitals reviewed. Constitutional: She appears well-developed and well-nourished. No distress.  Musculoskeletal: She exhibits edema (nonpitting bilaterally, L foot >>R foot).  Tender to palpation at lateral left ankle along peritoneal tendon, as well as tenderness of mid MTs Max tenderness with testing lateral ankle ligament with inversion stress. Diminished pedal pulses bilaterally (1+) No pain with squeeze. No palpable cords. No calf pain to palpation. Breakdown of foot anatomy.  Skin: Skin is warm and dry.  Evidence of venous insufficiency. Soft tissue swelling evident medial foot       Assessment & Plan:

## 2013-03-18 NOTE — Patient Instructions (Addendum)
xrays today - overall ok. Blood work today to check for gout. We will call you with results of blood work. In interim, treat with tramadol for pain - and we will set you up with local orthopedist for further evaluation. Flu shot today.

## 2013-03-18 NOTE — Assessment & Plan Note (Signed)
Check K and Cr today on daily lasix dose.

## 2013-03-21 ENCOUNTER — Telehealth: Payer: Self-pay

## 2013-03-21 ENCOUNTER — Other Ambulatory Visit: Payer: Self-pay | Admitting: Family Medicine

## 2013-03-21 DIAGNOSIS — Z9884 Bariatric surgery status: Secondary | ICD-10-CM

## 2013-03-21 DIAGNOSIS — I10 Essential (primary) hypertension: Secondary | ICD-10-CM

## 2013-03-21 DIAGNOSIS — E890 Postprocedural hypothyroidism: Secondary | ICD-10-CM

## 2013-03-21 NOTE — Telephone Encounter (Signed)
Pt left v/m; pt was seen 03/18/13; pt said lt ankle and foot is still swollen and painful; pt said pain med is helping some but pt wants to know if was checked for infection in foot or ankle.Please advise.

## 2013-03-21 NOTE — Telephone Encounter (Signed)
Yes we did check for infection/inflammation with inflammatory marker. When is ortho appt?

## 2013-03-22 NOTE — Telephone Encounter (Signed)
Message left for patient to return my call.  

## 2013-03-23 ENCOUNTER — Ambulatory Visit: Payer: Self-pay | Admitting: Podiatry

## 2013-03-23 NOTE — Telephone Encounter (Signed)
Message left for patient to return my call.  

## 2013-03-23 NOTE — Telephone Encounter (Signed)
Patient called. Went to ortho today and was given steroid injection and had [place] to check for clot which was negative. She will wait and see how she responds to the steroids and let you know.

## 2013-03-23 NOTE — Telephone Encounter (Signed)
Noted. Thanks.

## 2013-03-28 ENCOUNTER — Other Ambulatory Visit (INDEPENDENT_AMBULATORY_CARE_PROVIDER_SITE_OTHER): Payer: BC Managed Care – PPO

## 2013-03-28 DIAGNOSIS — E890 Postprocedural hypothyroidism: Secondary | ICD-10-CM

## 2013-03-28 DIAGNOSIS — Z9884 Bariatric surgery status: Secondary | ICD-10-CM

## 2013-03-28 LAB — TSH: TSH: 0.23 u[IU]/mL — ABNORMAL LOW (ref 0.35–5.50)

## 2013-03-28 LAB — FERRITIN: Ferritin: 7.4 ng/mL — ABNORMAL LOW (ref 10.0–291.0)

## 2013-03-28 LAB — VITAMIN B12: Vitamin B-12: 749 pg/mL (ref 211–911)

## 2013-03-29 ENCOUNTER — Other Ambulatory Visit: Payer: Self-pay | Admitting: *Deleted

## 2013-03-29 LAB — VITAMIN D 25 HYDROXY (VIT D DEFICIENCY, FRACTURES): Vit D, 25-Hydroxy: 50 ng/mL (ref 30–89)

## 2013-03-29 NOTE — Telephone Encounter (Signed)
Do you want patient to continue injections?

## 2013-03-30 NOTE — Telephone Encounter (Signed)
Lab Results  Component Value Date   VITAMINB12 749 03/28/2013  I recommend trial of oral B12 - take daily. Will recheck in 6 mo to 1 yr to ensure staying good range.

## 2013-04-02 NOTE — Telephone Encounter (Signed)
plz notify pt if not already done.

## 2013-04-04 ENCOUNTER — Encounter: Payer: BC Managed Care – PPO | Admitting: Family Medicine

## 2013-04-04 NOTE — Telephone Encounter (Signed)
Patient was notified last week.

## 2013-04-07 ENCOUNTER — Ambulatory Visit (INDEPENDENT_AMBULATORY_CARE_PROVIDER_SITE_OTHER): Payer: BC Managed Care – PPO | Admitting: Family Medicine

## 2013-04-07 ENCOUNTER — Encounter: Payer: Self-pay | Admitting: Family Medicine

## 2013-04-07 VITALS — BP 132/86 | HR 68 | Temp 99.3°F | Ht 70.0 in | Wt 237.8 lb

## 2013-04-07 DIAGNOSIS — Z9884 Bariatric surgery status: Secondary | ICD-10-CM

## 2013-04-07 DIAGNOSIS — Z1231 Encounter for screening mammogram for malignant neoplasm of breast: Secondary | ICD-10-CM

## 2013-04-07 DIAGNOSIS — Z0000 Encounter for general adult medical examination without abnormal findings: Secondary | ICD-10-CM

## 2013-04-07 DIAGNOSIS — M79672 Pain in left foot: Secondary | ICD-10-CM

## 2013-04-07 DIAGNOSIS — M79609 Pain in unspecified limb: Secondary | ICD-10-CM

## 2013-04-07 DIAGNOSIS — E890 Postprocedural hypothyroidism: Secondary | ICD-10-CM

## 2013-04-07 NOTE — Progress Notes (Signed)
Subjective:    Patient ID: Claire Strong, female    DOB: 1960-11-07, 52 y.o.   MRN: [id number]  HPI CC: CPE  Planning trip with husband to [place] for vacation.  Lives with mother in law and husband. Foot doing better - s/p cortisone injection by foot doctor.  ?tenosynovitis.  Seat belt use discussed Sunscreen use discussed.  No sunburns in last year.  Caffeine: 1 cup coffee/day  Lives with husband [name]) and mother in Social worker, youngest daughter. 1 dog.  Edu: 12th grade  Occupation: unemployed Activity: taking care of grandson, no regular exercise  Diet: good water, fruits/vegetables daily, not much red meat.  Preventative:  Last mammogram birads 2 04/15/2012.  Last pap smear 2009, always normal. S/p hysterectomy for uterine prolapse 1989. Ovaries remain. Prior OBGYN Dr. Barnabas Lister retired. No longer gets pap smears. Colonoscopy  05/2012   hyperplastic polyps x2, mild diverticulosis Arlyce Dice) rpt 10 yrs  tdap given 12/19/2010.  Flu shot 03/18/2013  Medications and allergies reviewed and updated in chart.  Past histories reviewed and updated if relevant as below. Patient Active Problem List   Diagnosis Date Noted  . Atrial tachycardia 01/25/2013  . Swelling of arm 12/14/2012  . Local superficial swelling 09/15/2012  . Right shoulder pain 03/31/2012  . Right hip pain 03/31/2012  . Left foot pain 03/31/2012  . Hypothyroidism, postsurgical 02/25/2012  . Edema 01/28/2012  . Headache 09/25/2011  . Palpitations 09/25/2011  . Bronchial asthma   . Left knee pain 05/23/2011  . Chest pain 05/23/2011  . Healthcare maintenance 03/18/2011  . Neck pain 03/18/2011  . Hot flashes 03/18/2011  . Nasal septal perforation   . Horseshoe kidney   . HTN (hypertension)   . Depression   . Lupus anticoagulant disorder   . 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 Lupus anticoagulant disorder   . Arthritis   . Depression   . HTN (hypertension)   .  Kidney stones     s/p renal hematoma after lithotripsy  . Horseshoe kidney     sole, R kidney damage  . 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 Jacqulyn Ducking  . Ulcer 2009  . History of DVT (deep vein thrombosis)     after 1st pregnancy  . Nasal septal perforation     chronic, ENT rec avoid antihistamine, INS  . Seasonal allergies     allergy shots, singulair  . Multinodular goiter     s/p thyroidectomy  . Bronchial asthma   . Post-surgical hypothyroidism     for multinodular goiter  . Diverticulosis     mild by colonoscopy  . 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 Trula Ore ortho)  . Chronic venous insufficiency 2007    s/p vein stripping and laser ablation   Past Surgical History  Procedure Laterality Date  . Gastric bypass  05/21/05    Dr. Jacqulyn Ducking, Roux-en-Y  . Partial hysterectomy  1989    ovaries remain  . Back surgery  2009  . Cholecystectomy  1998  . Appendectomy  1978  . Tonsillectomy  1969  . Nasal septum surgery  1981    deviated septum, [facility] at [facility]  . Total thyroidectomy  09/2011    benign path (done for multinodular goiter concern for cancer)  . Colonoscopy  05/2012    hyperplastic polyps x2, mild diverticulosis Arlyce Dice) rpt 10 yrs  . Skin surgery  basel cell  . Cyst excision      back  and shoulder   History  Substance Use Topics  . Smoking status: Former Smoker    Quit date: 06/16/2004  . Smokeless tobacco: Never Used     Comment: Socially-no longer  . Alcohol Use: No     Comment: Occasionally-wine   Family History  Problem Relation Age of Onset  . Diabetes Mother     prediabetes  . Alzheimer's disease Mother   . Hyperlipidemia Mother   . Diabetes Father   . Coronary artery disease Father 34    CABG  . Alcohol abuse Father   . Cancer Father     lung  . Cancer Maternal Uncle     brain  . Diabetes Paternal Grandmother   . Stroke Neg Hx   . Colon cancer Neg Hx   . Stomach cancer Neg Hx    Allergies   Allergen Reactions  . Sulfa Antibiotics Shortness Of Breath  . Nutritional Supplements Swelling    Unsure which   Current Outpatient Prescriptions on File Prior to Visit  Medication Sig Dispense Refill  . ACETAMINOPHEN-BUTALBITAL 50-325 MG TABS Take 1 tablet by mouth every 4 (four) hours.      Marland Kitchen albuterol (PROVENTIL HFA;VENTOLIN HFA) 108 (90 BASE) MCG/ACT inhaler Inhale 2 puffs into the lungs every 6 (six) hours as needed.  1 Inhaler  11  . ALLERGIST TRAY 1/2CC 27GX1/2" 27G X 1/2" 0.5 ML KIT once a week.       Marland Kitchen aspirin 81 MG tablet Take 81 mg by mouth daily.      . B Complex-C (SUPER B COMPLEX PO) Take 1 capsule by mouth daily.        . benazepril (LOTENSIN) 10 MG tablet TAKE ONE TABLET BY MOUTH EVERY DAY  90 tablet  3  . calcium carbonate (OS-CAL) 600 MG TABS Take 600 mg by mouth daily.      . carisoprodol (SOMA) 350 MG tablet TAKE ONE TABLET BY MOUTH AT BEDTIME  60 tablet  0  . Cholecalciferol (VITAMIN D3) 1000 UNITS tablet Take 2,000 Units by mouth daily.       . citalopram (CELEXA) 40 MG tablet Take 1 tablet (40 mg total) by mouth daily.  30 tablet  11  . diclofenac sodium (VOLTAREN) 1 % GEL Apply 1 application topically 3 (three) times daily.  1 Tube  1  . EPINEPHrine (EPI-PEN) 0.3 mg/0.3 mL DEVI Inject 0.3 mLs (0.3 mg total) into the muscle once.  1 Device  2  . Fluticasone-Salmeterol (ADVAIR DISKUS) 100-50 MCG/DOSE AEPB Inhale 1 puff into the lungs every 12 (twelve) hours.  60 each  6  . furosemide (LASIX) 20 MG tablet Take 20 mg by mouth daily.      Marland Kitchen levothyroxine (SYNTHROID, LEVOTHROID) 75 MCG tablet Take 75 mcg by mouth daily. With 97mcg      . levothyroxine (SYNTHROID, LEVOTHROID) 88 MCG tablet Take 88 mcg by mouth daily. With 18mcg      . loratadine (CLARITIN) 10 MG tablet Take 10 mg by mouth daily.      . Magnesium 250 MG TABS Take 1 tablet by mouth daily.      . metoprolol tartrate (LOPRESSOR) 25 MG tablet Take 1.5 tablets (37.5 mg total) by mouth 2 (two) times daily.  270  tablet  3  . montelukast (SINGULAIR) 10 MG tablet Take 10 mg by mouth at bedtime.        . Multiple Vitamin (  MULTIVITAMIN) tablet Take 1 tablet by mouth daily.        . ondansetron (ZOFRAN) 4 MG tablet Take 4 mg by mouth every 8 (eight) hours as needed.       . Potassium Gluconate 595 MG CAPS Take 1 capsule by mouth daily.      . propranolol (INDERAL) 20 MG tablet Take 1 tablet (20 mg total) by mouth as needed.  30 tablet  6  . traMADol (ULTRAM) 50 MG tablet Take 1 tablet (50 mg total) by mouth 2 (two) times daily as needed for pain.  40 tablet  0  . vitamin B-12 (CYANOCOBALAMIN) 1000 MCG tablet Take 1,000 mcg by mouth daily.       No current facility-administered medications on file prior to visit.     Review of Systems  Constitutional: Negative for fever, chills, activity change, appetite change, fatigue and unexpected weight change.  HENT: Negative for hearing loss.   Eyes: Negative for visual disturbance.  Respiratory: Negative for cough, chest tightness, shortness of breath and wheezing.   Cardiovascular: Negative for chest pain, palpitations and leg swelling.  Gastrointestinal: Negative for nausea, vomiting, abdominal pain, diarrhea, constipation, blood in stool and abdominal distention.  Genitourinary: Negative for hematuria and difficulty urinating.  Musculoskeletal: Negative for arthralgias, myalgias and neck pain.  Skin: Negative for rash.  Neurological: Negative for dizziness, seizures, syncope and headaches.  Hematological: Negative for adenopathy. Does not bruise/bleed easily.  Psychiatric/Behavioral: Negative for dysphoric mood. The patient is not nervous/anxious.        Objective:   Physical Exam  Nursing note and vitals reviewed. Constitutional: She is oriented to person, place, and time. She appears well-developed and well-nourished. No distress.  HENT:  Head: Normocephalic and atraumatic.  Right Ear: Hearing, tympanic membrane, external ear and ear canal normal.   Left Ear: Hearing, tympanic membrane, external ear and ear canal normal.  Nose: Nose normal.  Mouth/Throat: Oropharynx is clear and moist. No oropharyngeal exudate.  Eyes: Conjunctivae and EOM are normal. Pupils are equal, round, and reactive to light. No scleral icterus.  Neck: Normal range of motion. Neck supple. No thyromegaly present.  Cardiovascular: Normal rate, regular rhythm, normal heart sounds and intact distal pulses.   No murmur heard. Pulses:      Radial pulses are 2+ on the right side, and 2+ on the left side.  Pulmonary/Chest: Effort normal and breath sounds normal. No respiratory distress. She has no wheezes. She has no rales. Right breast exhibits no inverted nipple, no mass, no nipple discharge, no skin change and no tenderness. Left breast exhibits no inverted nipple, no mass, no nipple discharge, no skin change and no tenderness. Breasts are symmetrical.  Abdominal: Soft. Bowel sounds are normal. She exhibits no distension and no mass. There is no tenderness. There is no rebound and no guarding.  Musculoskeletal: Normal range of motion. She exhibits no edema.  Lymphadenopathy:    She has no cervical adenopathy.    She has no axillary adenopathy.       Right axillary: No lateral adenopathy present.       Left axillary: No lateral adenopathy present.      Right: No supraclavicular adenopathy present.       Left: No supraclavicular adenopathy present.  Neurological: She is alert and oriented to person, place, and time.  CN grossly intact, station and gait intact  Skin: Skin is warm and dry. No rash noted.  Psychiatric: She has a normal mood and affect. Her behavior  is normal. Judgment and thought content normal.       Assessment & Plan:

## 2013-04-07 NOTE — Patient Instructions (Addendum)
Start one pill of iron daily as your levels were low this year. Good to see you today! Call us with questions. I've ordered mammogram for you.

## 2013-04-07 NOTE — Assessment & Plan Note (Signed)
Preventative protocols reviewed and updated unless pt declined. Discussed healthy diet and lifestyle. Reviewed blood work in detail with patient.

## 2013-04-07 NOTE — Assessment & Plan Note (Signed)
Reviewed low TSH - pt desires to f/u with surgery.

## 2013-04-07 NOTE — Assessment & Plan Note (Signed)
S/p steroid injection by ortho - significantly improved .

## 2013-04-07 NOTE — Assessment & Plan Note (Signed)
Low iron - will start iron pill daily. Pt will f/u with surgery.

## 2013-04-21 ENCOUNTER — Ambulatory Visit: Payer: Self-pay | Admitting: Podiatry

## 2013-05-11 ENCOUNTER — Telehealth: Payer: Self-pay | Admitting: *Deleted

## 2013-05-11 NOTE — Telephone Encounter (Signed)
Patient dropped of surgical clearance form for upcoming foot surgery. CXR and EKG not required based on form. Recent CPE in October. Form in your IN box for completion.

## 2013-05-15 ENCOUNTER — Other Ambulatory Visit: Payer: Self-pay | Admitting: Family Medicine

## 2013-05-15 NOTE — Telephone Encounter (Signed)
plz phone in. 

## 2013-05-16 ENCOUNTER — Other Ambulatory Visit: Payer: Self-pay | Admitting: Family Medicine

## 2013-05-16 HISTORY — PX: ANKLE FUSION: SHX881

## 2013-05-16 NOTE — Telephone Encounter (Signed)
Rx called in as directed.   

## 2013-05-19 NOTE — Telephone Encounter (Signed)
Patient left message on VM that she now has congestion and chest tightness. She wanted to know if she should be seen prior to her surgery next week. Appt scheduled. Form not faxed until patient seen in office.

## 2013-05-19 NOTE — Telephone Encounter (Signed)
Filled and placed in Kim's box. 

## 2013-05-19 NOTE — Telephone Encounter (Signed)
will see tomorrow.

## 2013-05-20 ENCOUNTER — Encounter: Payer: Self-pay | Admitting: Family Medicine

## 2013-05-20 ENCOUNTER — Ambulatory Visit (INDEPENDENT_AMBULATORY_CARE_PROVIDER_SITE_OTHER): Payer: BC Managed Care – PPO | Admitting: Family Medicine

## 2013-05-20 VITALS — BP 108/74 | HR 65 | Temp 98.5°F | Wt 230.8 lb

## 2013-05-20 DIAGNOSIS — J069 Acute upper respiratory infection, unspecified: Secondary | ICD-10-CM | POA: Insufficient documentation

## 2013-05-20 MED ORDER — AZITHROMYCIN 250 MG PO TABS: ORAL_TABLET | ORAL | Status: DC

## 2013-05-20 MED ORDER — FLUTICASONE-SALMETEROL 100-50 MCG/DOSE IN AEPB: 1.0000 | INHALATION_SPRAY | Freq: Two times a day (BID) | RESPIRATORY_TRACT | Status: DC

## 2013-05-20 MED ORDER — GUAIFENESIN-CODEINE 100-10 MG/5ML PO SYRP: 5.0000 mL | ORAL_SOLUTION | Freq: Two times a day (BID) | ORAL | Status: DC | PRN

## 2013-05-20 NOTE — Assessment & Plan Note (Signed)
Anticipate viral given short duration. However with upcoming surgery, did provide with WASP for zpack  Treat cough with delsym at home, if this doesn't control night time cough, may fill cheratussin (script provided today) Will send physical clearance form to surgeon - up to their discretion to postpone surgery if needed. No need for xray today - lungs clear on exam.

## 2013-05-20 NOTE — Patient Instructions (Signed)
i do think you have an upper respiratory infection - likely viral at this point.  Treat with cheratussin cough syrup at night time as needed. If not improving in next few days or any worsening, fill zpack. Good to see you today, call us with questions.

## 2013-05-20 NOTE — Progress Notes (Signed)
Pre-visit discussion using our clinic review tool. No additional management support is needed unless otherwise documented below in the visit note.  

## 2013-05-20 NOTE — Progress Notes (Signed)
   Subjective:    Patient ID: Claire Strong, female    DOB: 1960-09-01, 52 y.o.   MRN: [id number]  HPI CC: ?URI  Claire Strong presents today for evaluation of possible upper respiratory infection in setting of upcoming L foot surgery.  To have surgery by Dr. Ether Griffins at [facility] (to have greenfield filter placed and to start lovenox perioperatively after consult with heme due to lupus AC disorder).  4d h/o sinus congestion and drainage, cough.  + hoarse voice.  Having L ear pain and ear congestion.  + PNdrainage. No fevers/chills, headaches, abd pain.  No dyspnea or wheezing. + sick contacts at home. Has restarted her advair.   Continues other allergy medications.  Did receive allergy shot on Monday. Has received flu shot. No smokers at home. H/o bronchial asthma.  Past Medical History  Diagnosis Date  . Lupus anticoagulant disorder   . Arthritis   . Depression   . HTN (hypertension)   . Kidney stones     s/p renal hematoma after lithotripsy  . Horseshoe kidney     sole, R kidney damage  . 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 Jacqulyn Ducking  . Ulcer 2009  . History of DVT (deep vein thrombosis)     after 1st pregnancy  . Nasal septal perforation     chronic, ENT rec avoid antihistamine, INS  . Seasonal allergies     allergy shots, singulair  . Multinodular goiter     s/p thyroidectomy  . Bronchial asthma   . Post-surgical hypothyroidism     for multinodular goiter  . Diverticulosis     mild by colonoscopy  . 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 Trula Ore ortho)  . Chronic venous insufficiency 2007    s/p vein stripping and laser ablation     Review of Systems Per HPI    Objective:   Physical Exam  Nursing note and vitals reviewed. Constitutional: She appears well-developed and well-nourished. No distress.  HENT:  Head: Normocephalic and atraumatic.  Right Ear: Hearing, tympanic membrane, external ear and ear canal normal.  Left Ear:  Hearing, tympanic membrane, external ear and ear canal normal.  Nose: No mucosal edema or rhinorrhea. Right sinus exhibits no maxillary sinus tenderness and no frontal sinus tenderness. Left sinus exhibits no maxillary sinus tenderness and no frontal sinus tenderness.  Mouth/Throat: Uvula is midline, oropharynx is clear and moist and mucous membranes are normal. No oropharyngeal exudate, posterior oropharyngeal edema, posterior oropharyngeal erythema or tonsillar abscesses.  Hoarse voice  Eyes: Conjunctivae and EOM are normal. Pupils are equal, round, and reactive to light. No scleral icterus.  Neck: Normal range of motion. Neck supple.  Cardiovascular: Normal rate, regular rhythm, normal heart sounds and intact distal pulses.   No murmur heard. Pulmonary/Chest: Effort normal and breath sounds normal. No respiratory distress. She has no wheezes. She has no rales.  Bibasilar crackles that clear with cough and deep inspiration  Lymphadenopathy:    She has no cervical adenopathy.  Skin: Skin is warm and dry. No rash noted.       Assessment & Plan:

## 2013-05-23 ENCOUNTER — Ambulatory Visit: Payer: BC Managed Care – PPO

## 2013-05-23 ENCOUNTER — Ambulatory Visit: Payer: Self-pay | Admitting: Podiatry

## 2013-05-23 LAB — CBC WITH DIFFERENTIAL/PLATELET
Eosinophil #: 0.1 10*3/uL (ref 0.0–0.7)
Eosinophil %: 2.1 %
HCT: 38.1 % (ref 35.0–47.0)
HGB: 12.9 g/dL (ref 12.0–16.0)
Lymphocyte #: 1.5 10*3/uL (ref 1.0–3.6)
MCH: 28.4 pg (ref 26.0–34.0)
MCV: 84 fL (ref 80–100)
Monocyte #: 0.3 x10 3/mm (ref 0.2–0.9)
Neutrophil %: 57.2 %
RBC: 4.53 10*6/uL (ref 3.80–5.20)
RDW: 14.3 % (ref 11.5–14.5)
WBC: 4.5 10*3/uL (ref 3.6–11.0)

## 2013-05-23 LAB — PROTIME-INR
INR: 1
Prothrombin Time: 13 secs (ref 11.5–14.7)

## 2013-05-23 LAB — BASIC METABOLIC PANEL
Anion Gap: 2 — ABNORMAL LOW (ref 7–16)
BUN: 14 mg/dL (ref 7–18)
Chloride: 103 mmol/L (ref 98–107)
Co2: 31 mmol/L (ref 21–32)
Creatinine: 0.92 mg/dL (ref 0.60–1.30)
EGFR (African American): >60
EGFR (Non-African Amer.): >60
Glucose: 84 mg/dL (ref 65–99)
Potassium: 4.1 mmol/L (ref 3.5–5.1)

## 2013-05-23 LAB — APTT: Activated PTT: 31.5 secs (ref 23.6–35.9)

## 2013-05-24 ENCOUNTER — Ambulatory Visit: Payer: Self-pay | Admitting: Vascular Surgery

## 2013-05-27 ENCOUNTER — Ambulatory Visit: Payer: Self-pay | Admitting: Podiatry

## 2013-06-04 ENCOUNTER — Other Ambulatory Visit: Payer: Self-pay | Admitting: Family Medicine

## 2013-06-06 MED ORDER — CARISOPRODOL 350 MG PO TABS: ORAL_TABLET | ORAL | Status: DC

## 2013-06-06 NOTE — Telephone Encounter (Signed)
plz phone in. 

## 2013-06-06 NOTE — Telephone Encounter (Signed)
Rx called in as directed.   

## 2013-06-10 ENCOUNTER — Ambulatory Visit: Payer: Self-pay | Admitting: Podiatry

## 2013-06-16 HISTORY — PX: OTHER SURGICAL HISTORY: SHX169

## 2013-06-17 ENCOUNTER — Ambulatory Visit: Payer: Self-pay | Admitting: Orthopedic Surgery

## 2013-06-17 ENCOUNTER — Ambulatory Visit: Payer: Self-pay | Admitting: Family Medicine

## 2013-06-20 ENCOUNTER — Encounter: Payer: Self-pay | Admitting: Family Medicine

## 2013-06-28 ENCOUNTER — Ambulatory Visit: Payer: Self-pay | Admitting: Otolaryngology

## 2013-06-28 LAB — TSH: Thyroid Stimulating Horm: 0.304 u[IU]/mL — ABNORMAL LOW

## 2013-06-28 LAB — T4, FREE: FREE THYROXINE: 1.39 ng/dL (ref 0.76–1.46)

## 2013-07-20 ENCOUNTER — Telehealth: Payer: Self-pay

## 2013-07-20 ENCOUNTER — Telehealth: Payer: Self-pay | Admitting: *Deleted

## 2013-07-20 NOTE — Telephone Encounter (Signed)
Agree.  If persistent sxs, recommend she come in for evaluation.

## 2013-07-20 NOTE — Telephone Encounter (Signed)
Patient called and advised that she didn't need to fill the zpack Rx that you gave her previously, but started having symptoms again with green mucous and congestion, so she filled it last week and just finished it today. She said she is still having symptoms along with a cough and a lot of drainage. She wanted to know if you would send in more abx for her. I advised her the zpack would stay in her system for quite awhile and that I would ask about a cough syrup. She said you gave her a script for one that she hasn't gotten filled yet. I advised to get that filled for her cough and to use a nasal saline spray along with immediate release guafenisen and lots of water to break up the mucous. I told her to watch for fever or worsening instead of improvement. She verbalized understanding.

## 2013-07-20 NOTE — Telephone Encounter (Signed)
Request from disability determination services , sent to [employer] on 07/21/2012 .

## 2013-08-05 ENCOUNTER — Telehealth: Payer: Self-pay

## 2013-08-05 NOTE — Telephone Encounter (Signed)
Request fromDisability Determination Services , sent to [employer] on 08/05/2013 .

## 2013-08-08 ENCOUNTER — Encounter: Payer: Self-pay | Admitting: Family Medicine

## 2013-08-08 ENCOUNTER — Other Ambulatory Visit: Payer: Self-pay | Admitting: Family Medicine

## 2013-08-08 NOTE — Telephone Encounter (Signed)
Pt request status of soma refill.advised med already called to pharmacy. Pt voiced understanding.

## 2013-08-08 NOTE — Telephone Encounter (Signed)
plz phone in. 

## 2013-08-08 NOTE — Telephone Encounter (Signed)
Rx called in as directed.   

## 2013-08-09 MED ORDER — BUPROPION HCL 75 MG PO TABS: 75.0000 mg | ORAL_TABLET | Freq: Two times a day (BID) | ORAL | Status: DC

## 2013-08-10 ENCOUNTER — Ambulatory Visit: Payer: Self-pay | Admitting: Orthopedic Surgery

## 2013-08-10 LAB — URINALYSIS, COMPLETE
BILIRUBIN, UR: NEGATIVE
Bacteria: NONE SEEN
Blood: NEGATIVE
Glucose,UR: NEGATIVE mg/dL (ref 0–75)
Ketone: NEGATIVE
Nitrite: NEGATIVE
Ph: 7 (ref 4.5–8.0)
Protein: NEGATIVE
RBC,UR: <1 /HPF (ref 0–5)
Specific Gravity: 1.01 (ref 1.003–1.030)

## 2013-08-10 LAB — BASIC METABOLIC PANEL
Anion Gap: 3 — ABNORMAL LOW (ref 7–16)
BUN: 12 mg/dL (ref 7–18)
Calcium, Total: 8.9 mg/dL (ref 8.5–10.1)
Chloride: 104 mmol/L (ref 98–107)
Co2: 30 mmol/L (ref 21–32)
Creatinine: 0.86 mg/dL (ref 0.60–1.30)
EGFR (African American): >60
Glucose: 84 mg/dL (ref 65–99)
Osmolality: 273 (ref 275–301)
Potassium: 4.3 mmol/L (ref 3.5–5.1)
SODIUM: 137 mmol/L (ref 136–145)

## 2013-08-10 LAB — CBC
HCT: 41.2 % (ref 35.0–47.0)
HGB: 14.1 g/dL (ref 12.0–16.0)
MCH: 30 pg (ref 26.0–34.0)
MCHC: 34.2 g/dL (ref 32.0–36.0)
MCV: 88 fL (ref 80–100)
PLATELETS: 193 10*3/uL (ref 150–440)
RBC: 4.69 10*6/uL (ref 3.80–5.20)
RDW: 13.6 % (ref 11.5–14.5)
WBC: 4.8 10*3/uL (ref 3.6–11.0)

## 2013-08-10 LAB — PROTIME-INR
INR: 0.9
Prothrombin Time: 12.4 secs (ref 11.5–14.7)

## 2013-08-10 LAB — MRSA PCR SCREENING

## 2013-08-10 LAB — APTT: ACTIVATED PTT: 30.4 s (ref 23.6–35.9)

## 2013-08-10 LAB — SEDIMENTATION RATE: Erythrocyte Sed Rate: 6 mm/hr (ref 0–30)

## 2013-08-14 HISTORY — PX: TOTAL KNEE ARTHROPLASTY: SHX125

## 2013-08-22 ENCOUNTER — Ambulatory Visit (INDEPENDENT_AMBULATORY_CARE_PROVIDER_SITE_OTHER): Payer: BC Managed Care – PPO | Admitting: Family Medicine

## 2013-08-22 ENCOUNTER — Encounter: Payer: Self-pay | Admitting: Family Medicine

## 2013-08-22 VITALS — BP 118/82 | HR 88 | Temp 98.0°F | Wt 231.0 lb

## 2013-08-22 DIAGNOSIS — I10 Essential (primary) hypertension: Secondary | ICD-10-CM

## 2013-08-22 DIAGNOSIS — F32A Depression, unspecified: Secondary | ICD-10-CM

## 2013-08-22 DIAGNOSIS — F3289 Other specified depressive episodes: Secondary | ICD-10-CM

## 2013-08-22 DIAGNOSIS — M25562 Pain in left knee: Secondary | ICD-10-CM

## 2013-08-22 DIAGNOSIS — M25569 Pain in unspecified knee: Secondary | ICD-10-CM

## 2013-08-22 DIAGNOSIS — F329 Major depressive disorder, single episode, unspecified: Secondary | ICD-10-CM

## 2013-08-22 MED ORDER — METOPROLOL TARTRATE 25 MG PO TABS: 37.5000 mg | ORAL_TABLET | Freq: Two times a day (BID) | ORAL | Status: DC

## 2013-08-22 MED ORDER — FUROSEMIDE 20 MG PO TABS: 20.0000 mg | ORAL_TABLET | Freq: Every day | ORAL | Status: DC

## 2013-08-22 NOTE — Assessment & Plan Note (Addendum)
Acute deterioration recently responsive to wellbutrin as adjuvant. Continue celexa 40mg  daily and wellbutrin 75mg  bid.  Discussed possible titration of wellbutrin. PHQ9 = 18, somewhat difficult to function GAD7 = 6

## 2013-08-22 NOTE — Patient Instructions (Signed)
Let's continue wellbutrin at 75mg  twice daily.  If you'd like to increase wellbutrin dose in 1-2 weeks, let me know. Good to see you today, call us with questions. Return as needed.

## 2013-08-22 NOTE — Assessment & Plan Note (Signed)
Severe OA - pending TKR Thursday.  On asa and lovenox given h/o lupus AC.

## 2013-08-22 NOTE — Progress Notes (Signed)
BP 118/82  Pulse 88  Temp(Src) 98 F (36.7 C) (Oral)  Wt 231 lb (104.781 kg)   CC: f/u mood  Subjective:    Patient ID: Claire Strong, female    DOB: 02/16/1961, 53 y.o.   MRN: [id number]  HPI: Claire Strong is a 53 y.o. female presenting on 08/22/2013 with Follow-up   See recent mychart message.  Pt notified me she was struggling with situational depression - dealing with husband and MIL's illnesses, and her own recovery from ankle surgery.  Also stress with caring for grandchildren.  Out of work, insurance running out this summer.  We continued celexa $RemoveBeforeDE'40mg'WmIpcGgNQJHVSNy$  daily and added on wellbutrin at $RemoveBefor'75mg'AVsThSbAfIYv$  bid.  I asked her to return for f/u visit today.  This is helping some.  Denies SI/HI.  She is anxious about upcoming L knee replacement this Thursday for severe knee OA.  Ankle surgery was 05/27/2013.    States thyroid was recently checked by endo and normal.  Wt Readings from Last 3 Encounters:  08/22/13 231 lb (104.781 kg)  05/20/13 230 lb 12 oz (104.668 kg)  04/07/13 237 lb 12 oz (107.843 kg)    Relevant past medical, surgical, family and social history reviewed and updated as indicated. Allergies and medications reviewed and updated. Current Outpatient Prescriptions on File Prior to Visit  Medication Sig  . ACETAMINOPHEN-BUTALBITAL 50-325 MG TABS Take 1 tablet by mouth every 4 (four) hours.  Marland Kitchen albuterol (PROVENTIL HFA;VENTOLIN HFA) 108 (90 BASE) MCG/ACT inhaler Inhale 2 puffs into the lungs every 6 (six) hours as needed.  . ALLERGIST TRAY 1/2CC 27GX1/2" 27G X 1/2" 0.5 ML KIT once a week.   Marland Kitchen aspirin 81 MG tablet Take 81 mg by mouth daily.  Marland Kitchen azithromycin (ZITHROMAX Z-PAK) 250 MG tablet Two on day 1 followed by one daily for 4 days for total of 5 days, PO  . B Complex-C (SUPER B COMPLEX PO) Take 1 capsule by mouth daily.    . benazepril (LOTENSIN) 10 MG tablet TAKE ONE TABLET BY MOUTH EVERY DAY  . buPROPion (WELLBUTRIN) 75 MG tablet Take 1 tablet (75 mg total) by mouth 2 (two) times  daily.  . calcium carbonate (OS-CAL) 600 MG TABS Take 600 mg by mouth daily.  . carisoprodol (SOMA) 350 MG tablet TAKE ONE TABLET BY MOUTH AT BEDTIME  . cetirizine (ZYRTEC) 10 MG tablet Take 10 mg by mouth daily.  . Cholecalciferol (VITAMIN D3) 1000 UNITS tablet Take 2,000 Units by mouth daily.   . citalopram (CELEXA) 40 MG tablet Take 1 tablet (40 mg total) by mouth daily.  . diclofenac sodium (VOLTAREN) 1 % GEL Apply 1 application topically 3 (three) times daily.  Marland Kitchen EPINEPHrine (EPI-PEN) 0.3 mg/0.3 mL DEVI Inject 0.3 mLs (0.3 mg total) into the muscle once.  . ferrous sulfate 325 (65 FE) MG tablet Take 325 mg by mouth daily with breakfast.  . Fluticasone-Salmeterol (ADVAIR DISKUS) 100-50 MCG/DOSE AEPB Inhale 1 puff into the lungs every 12 (twelve) hours.  Marland Kitchen guaiFENesin-codeine (ROBITUSSIN AC) 100-10 MG/5ML syrup Take 5 mLs by mouth 2 (two) times daily as needed for cough.  . levothyroxine (SYNTHROID, LEVOTHROID) 75 MCG tablet Take 75 mcg by mouth daily. With 30mcg  . levothyroxine (SYNTHROID, LEVOTHROID) 88 MCG tablet Take 88 mcg by mouth daily. With 57mcg  . Magnesium 250 MG TABS Take 1 tablet by mouth daily.  . montelukast (SINGULAIR) 10 MG tablet Take 10 mg by mouth at bedtime.    . Multiple Vitamin (MULTIVITAMIN) tablet Take  1 tablet by mouth daily.    . ondansetron (ZOFRAN) 4 MG tablet Take 4 mg by mouth every 8 (eight) hours as needed.   . Potassium Gluconate 595 MG CAPS Take 1 capsule by mouth daily.  . traMADol (ULTRAM) 50 MG tablet Take 1 tablet (50 mg total) by mouth 2 (two) times daily as needed for pain.  . vitamin B-12 (CYANOCOBALAMIN) 1000 MCG tablet Take 1,000 mcg by mouth daily.  . propranolol (INDERAL) 20 MG tablet Take 1 tablet (20 mg total) by mouth as needed.   No current facility-administered medications on file prior to visit.    Review of Systems Per HPI unless specifically indicated above    Objective:    BP 118/82  Pulse 88  Temp(Src) 98 F (36.7 C) (Oral)   Wt 231 lb (104.781 kg)  Physical Exam  Nursing note and vitals reviewed. Constitutional: She appears well-developed and well-nourished. No distress.  HENT:  Mouth/Throat: Oropharynx is clear and moist. No oropharyngeal exudate.  Cardiovascular: Normal rate, regular rhythm, normal heart sounds and intact distal pulses.   No murmur heard. Pulmonary/Chest: Effort normal and breath sounds normal. No respiratory distress. She has no wheezes. She has no rales.  Musculoskeletal: She exhibits no edema.  Neurological:  Antalgic gait with cane  Skin: Skin is warm and dry. No rash noted.  Psychiatric: She has a normal mood and affect.       Assessment & Plan:   Problem List Items Addressed This Visit   Depression - Primary     Acute deterioration recently responsive to wellbutrin as adjuvant. Continue celexa $RemoveBeforeD'40mg'qVOlZfhtiNESeN$  daily and wellbutrin $RemoveBefore'75mg'DPUNJyYinscAl$  bid.  Discussed possible titration of wellbutrin. PHQ9 = 18, somewhat difficult to function GAD7 = 6    HTN (hypertension)     meds refilled.    Relevant Medications      furosemide (LASIX) tablet      metoprolol tartrate (LOPRESSOR) tablet   Left knee pain     Severe OA - pending TKR Thursday.  On asa and lovenox given h/o lupus AC.        Follow up plan: Return if symptoms worsen or fail to improve.

## 2013-08-22 NOTE — Assessment & Plan Note (Signed)
meds refilled 

## 2013-08-22 NOTE — Progress Notes (Signed)
Pre visit review using our clinic review tool, if applicable. No additional management support is needed unless otherwise documented below in the visit note. 

## 2013-08-23 ENCOUNTER — Telehealth: Payer: Self-pay | Admitting: Family Medicine

## 2013-08-23 NOTE — Telephone Encounter (Signed)
Relevant patient education assigned to patient using Emmi. ° °

## 2013-08-25 ENCOUNTER — Inpatient Hospital Stay: Payer: Self-pay | Admitting: Orthopedic Surgery

## 2013-08-26 LAB — BASIC METABOLIC PANEL
Anion Gap: 5 — ABNORMAL LOW (ref 7–16)
BUN: 18 mg/dL (ref 7–18)
Calcium, Total: 7.7 mg/dL — ABNORMAL LOW (ref 8.5–10.1)
Chloride: 105 mmol/L (ref 98–107)
Co2: 25 mmol/L (ref 21–32)
Creatinine: 1.38 mg/dL — ABNORMAL HIGH (ref 0.60–1.30)
EGFR (African American): 51 — ABNORMAL LOW
EGFR (Non-African Amer.): 44 — ABNORMAL LOW
GLUCOSE: 110 mg/dL — AB (ref 65–99)
Osmolality: 273 (ref 275–301)
POTASSIUM: 4.8 mmol/L (ref 3.5–5.1)
Sodium: 135 mmol/L — ABNORMAL LOW (ref 136–145)

## 2013-08-26 LAB — PLATELET COUNT: PLATELETS: 130 10*3/uL — AB (ref 150–440)

## 2013-08-26 LAB — HEMOGLOBIN: HGB: 11.4 g/dL — ABNORMAL LOW (ref 12.0–16.0)

## 2013-08-27 LAB — BASIC METABOLIC PANEL
Anion Gap: 3 — ABNORMAL LOW (ref 7–16)
BUN: 12 mg/dL (ref 7–18)
CO2: 27 mmol/L (ref 21–32)
CREATININE: 0.91 mg/dL (ref 0.60–1.30)
Calcium, Total: 7.4 mg/dL — ABNORMAL LOW (ref 8.5–10.1)
Chloride: 102 mmol/L (ref 98–107)
EGFR (African American): >60
GLUCOSE: 107 mg/dL — AB (ref 65–99)
OSMOLALITY: 265 (ref 275–301)
Potassium: 4.5 mmol/L (ref 3.5–5.1)
Sodium: 132 mmol/L — ABNORMAL LOW (ref 136–145)

## 2013-08-27 LAB — HEMOGLOBIN: HGB: 9.9 g/dL — ABNORMAL LOW (ref 12.0–16.0)

## 2013-08-30 ENCOUNTER — Ambulatory Visit: Payer: Self-pay | Admitting: Orthopedic Surgery

## 2013-08-30 LAB — PATHOLOGY REPORT

## 2013-09-02 ENCOUNTER — Telehealth: Payer: Self-pay

## 2013-09-02 NOTE — Telephone Encounter (Signed)
Danielle PT with [facility] did PT eval and request verbal orders for home health PT 5 x a week for 2 weeks and 3 x a week for 5 weeks.Please advise.

## 2013-09-02 NOTE — Telephone Encounter (Signed)
Claire Strong notified

## 2013-09-02 NOTE — Telephone Encounter (Signed)
Ok to do. Thanks.  

## 2013-09-05 DIAGNOSIS — R279 Unspecified lack of coordination: Secondary | ICD-10-CM

## 2013-09-05 DIAGNOSIS — Z96659 Presence of unspecified artificial knee joint: Secondary | ICD-10-CM

## 2013-09-05 DIAGNOSIS — Z471 Aftercare following joint replacement surgery: Secondary | ICD-10-CM

## 2013-09-05 DIAGNOSIS — M159 Polyosteoarthritis, unspecified: Secondary | ICD-10-CM

## 2013-09-05 DIAGNOSIS — R269 Unspecified abnormalities of gait and mobility: Secondary | ICD-10-CM

## 2013-09-06 ENCOUNTER — Telehealth: Payer: Self-pay | Admitting: Family Medicine

## 2013-09-06 NOTE — Telephone Encounter (Signed)
Ok to change this. plz give verbal order

## 2013-09-06 NOTE — Telephone Encounter (Signed)
Spoke to [employer] and provided verbal order for pt

## 2013-09-06 NOTE — Telephone Encounter (Signed)
Danielle from [facility] left vm requesting a change in frequency for home PT visits.  She would like to see pt once a week x 1, 3 times a week x 2, and 2 times a week x 2.  Cb [phone]

## 2013-09-23 ENCOUNTER — Telehealth: Payer: Self-pay

## 2013-09-23 NOTE — Telephone Encounter (Signed)
Danielle PT [facility] left v/m requesting verbal order to continue home health PT 1 x a week for 2 weeks.Please advise.

## 2013-09-23 NOTE — Telephone Encounter (Signed)
Ok to give verbal approval for this. Thanks.

## 2013-09-26 NOTE — Telephone Encounter (Signed)
Danielle notified

## 2013-09-30 ENCOUNTER — Telehealth: Payer: Self-pay | Admitting: Family Medicine

## 2013-09-30 NOTE — Telephone Encounter (Signed)
PT orders signed by Nicki Reaper, NP since Dr. Sharen Hones is out of office until 10/11/13.  Faxed back to [phone].

## 2013-10-17 ENCOUNTER — Ambulatory Visit (INDEPENDENT_AMBULATORY_CARE_PROVIDER_SITE_OTHER): Payer: BC Managed Care – PPO | Admitting: Family Medicine

## 2013-10-17 ENCOUNTER — Other Ambulatory Visit: Payer: Self-pay | Admitting: Family Medicine

## 2013-10-17 ENCOUNTER — Encounter: Payer: Self-pay | Admitting: Family Medicine

## 2013-10-17 VITALS — BP 124/78 | HR 80 | Temp 98.5°F | Wt 213.0 lb

## 2013-10-17 DIAGNOSIS — N939 Abnormal uterine and vaginal bleeding, unspecified: Secondary | ICD-10-CM

## 2013-10-17 DIAGNOSIS — N898 Other specified noninflammatory disorders of vagina: Secondary | ICD-10-CM

## 2013-10-17 LAB — WET PREP, GENITAL
CLUE CELLS WET PREP: NONE SEEN — AB
Trich, Wet Prep: NONE SEEN — AB
Yeast Wet Prep HPF POC: NONE SEEN — AB

## 2013-10-17 MED ORDER — FLUCONAZOLE 150 MG PO TABS: 150.0000 mg | ORAL_TABLET | Freq: Once | ORAL | Status: DC

## 2013-10-17 NOTE — Progress Notes (Signed)
BP 124/78  Pulse 80  Temp(Src) 98.5 F (36.9 C) (Oral)  Wt 213 lb (96.616 kg)   CC: vag bleeding  Subjective:    Patient ID: Claire Strong, female    DOB: 09-09-1960, 53 y.o.   MRN: [id number]  HPI: Claire Strong is a 53 y.o. female presenting on 10/17/2013 for Vaginal Bleeding   Ongoing episodes of bleeding for last 3 weeks.  Notes some bleeding with wiping - initially thought hemorrhoid related but now finding some bleeding from vaginal area.  + vag atrophy/dryness for years.  Denies pelvic pain or abd pain, no fevers/chills.  Last active hemorrhoid was years ago.  Currently only on ASA.  Also on narcotics. Denies changes with sex or trouble with sex/dyspareunia (no sexual relations in 5 years)  Currently on boric acid capsule once weekly. Has tried premarin cream in past - caused vaginal infection.  Unable to use lubricants as well.  lovenox was stopped 2 weeks ago (after left knee surgery).  Partial hysterectomy (uterus and cervix) at age 49 yo (uterine prolapse after 3rd child (2nd living)).  Ovaries remain No h/o abnormal pap smears. Per patient normal DEXA.   Relevant past medical, surgical, family and social history reviewed and updated as indicated.  Allergies and medications reviewed and updated. Current Outpatient Prescriptions on File Prior to Visit  Medication Sig  . ACETAMINOPHEN-BUTALBITAL 50-325 MG TABS Take 1 tablet by mouth every 4 (four) hours.  Marland Kitchen albuterol (PROVENTIL HFA;VENTOLIN HFA) 108 (90 BASE) MCG/ACT inhaler Inhale 2 puffs into the lungs every 6 (six) hours as needed.  . ALLERGIST TRAY 1/2CC 27GX1/2" 27G X 1/2" 0.5 ML KIT once a week.   Marland Kitchen aspirin 81 MG tablet Take 81 mg by mouth daily.  . B Complex-C (SUPER B COMPLEX PO) Take 1 capsule by mouth daily.    . benazepril (LOTENSIN) 10 MG tablet TAKE ONE TABLET BY MOUTH EVERY DAY  . buPROPion (WELLBUTRIN) 75 MG tablet Take 1 tablet (75 mg total) by mouth 2 (two) times daily.  . calcium carbonate (OS-CAL) 600 MG  TABS Take 600 mg by mouth daily.  . carisoprodol (SOMA) 350 MG tablet TAKE ONE TABLET BY MOUTH AT BEDTIME  . cetirizine (ZYRTEC) 10 MG tablet Take 10 mg by mouth daily.  . Cholecalciferol (VITAMIN D3) 1000 UNITS tablet Take 2,000 Units by mouth daily.   . citalopram (CELEXA) 40 MG tablet Take 1 tablet (40 mg total) by mouth daily.  . diclofenac sodium (VOLTAREN) 1 % GEL Apply 1 application topically 3 (three) times daily.  Marland Kitchen EPINEPHrine (EPI-PEN) 0.3 mg/0.3 mL DEVI Inject 0.3 mLs (0.3 mg total) into the muscle once.  . ferrous sulfate 325 (65 FE) MG tablet Take 325 mg by mouth daily with breakfast.  . Fluticasone-Salmeterol (ADVAIR DISKUS) 100-50 MCG/DOSE AEPB Inhale 1 puff into the lungs every 12 (twelve) hours.  . furosemide (LASIX) 20 MG tablet Take 1 tablet (20 mg total) by mouth daily.  Marland Kitchen levothyroxine (SYNTHROID, LEVOTHROID) 75 MCG tablet Take 75 mcg by mouth daily. With 83mg  . levothyroxine (SYNTHROID, LEVOTHROID) 88 MCG tablet Take 88 mcg by mouth daily. With 764m  . Magnesium 250 MG TABS Take 1 tablet by mouth daily.  . metoprolol tartrate (LOPRESSOR) 25 MG tablet Take 1.5 tablets (37.5 mg total) by mouth 2 (two) times daily.  . montelukast (SINGULAIR) 10 MG tablet Take 10 mg by mouth at bedtime.    . Multiple Vitamin (MULTIVITAMIN) tablet Take 1 tablet by mouth daily.    .Marland Kitchen  ondansetron (ZOFRAN) 4 MG tablet Take 4 mg by mouth every 8 (eight) hours as needed.   . Potassium Gluconate 595 MG CAPS Take 1 capsule by mouth daily.  . traMADol (ULTRAM) 50 MG tablet Take 1 tablet (50 mg total) by mouth 2 (two) times daily as needed for pain.  . vitamin B-12 (CYANOCOBALAMIN) 1000 MCG tablet Take 1,000 mcg by mouth daily.  . propranolol (INDERAL) 20 MG tablet Take 1 tablet (20 mg total) by mouth as needed.   No current facility-administered medications on file prior to visit.    Review of Systems Per HPI unless specifically indicated above    Objective:    BP 124/78  Pulse 80   Temp(Src) 98.5 F (36.9 C) (Oral)  Wt 213 lb (96.616 kg)  Physical Exam  Nursing note and vitals reviewed. Constitutional: She appears well-developed and well-nourished. No distress.  Genitourinary: Vagina normal. Pelvic exam was performed with patient supine. There is no rash, tenderness, lesion or injury on the right labia. There is no rash, tenderness, lesion or injury on the left labia. Right adnexum displays no mass, no tenderness and no fullness. Left adnexum displays no mass, no tenderness and no fullness.  Uterus/cervix absent Some white cottage cheese discharge present along with irritation at deep vaginal wall No bleeding noted. Loss of vaginal verrucae       Assessment & Plan:   Problem List Items Addressed This Visit   Vagina bleeding - Primary     Menopausal to post menopausal bleed of last few weeks. Exam consistent with possible vag candidiasis and atrophic vaginitis. Pt states cannot tolerate topical estrogen or lubricants. Will treat presumed vag candidiasis with diflucan.  If persistent consider retrial of topical estrogen vs referral to GYN. Pt agrees with plan.        Follow up plan: No Follow-up on file.

## 2013-10-17 NOTE — Progress Notes (Signed)
Pre visit review using our clinic review tool, if applicable. No additional management support is needed unless otherwise documented below in the visit note. 

## 2013-10-17 NOTE — Assessment & Plan Note (Signed)
Menopausal to post menopausal bleed of last few weeks. Exam consistent with possible vag candidiasis and atrophic vaginitis. Pt states cannot tolerate topical estrogen or lubricants. Will treat presumed vag candidiasis with diflucan.  If persistent consider retrial of topical estrogen vs referral to GYN. Pt agrees with plan.

## 2013-10-17 NOTE — Patient Instructions (Signed)
Wet prep today - I think we have vaginal yeast infection - treat with diflucan course. There is also some vaginal atrophy - if not improved with above we may do another trial of topical estrogen. If not improving, we will refer you to GYN for further evaluation.  Let me know if not better.

## 2013-10-17 NOTE — Telephone Encounter (Signed)
plz phone in. 

## 2013-10-18 NOTE — Telephone Encounter (Signed)
Rx called in as directed.   

## 2013-10-25 ENCOUNTER — Ambulatory Visit: Payer: Self-pay | Admitting: Vascular Surgery

## 2013-11-07 ENCOUNTER — Other Ambulatory Visit: Payer: Self-pay | Admitting: Family Medicine

## 2013-12-06 ENCOUNTER — Other Ambulatory Visit: Payer: Self-pay | Admitting: Family Medicine

## 2013-12-06 NOTE — Telephone Encounter (Signed)
plz phone in. 

## 2013-12-06 NOTE — Telephone Encounter (Signed)
Ok to refill 

## 2013-12-06 NOTE — Telephone Encounter (Signed)
Rx called in as directed.   

## 2013-12-27 ENCOUNTER — Ambulatory Visit: Payer: Self-pay | Admitting: Otolaryngology

## 2013-12-27 LAB — TSH: THYROID STIMULATING HORM: 0.349 u[IU]/mL — AB

## 2013-12-27 LAB — T4, FREE: Free Thyroxine: 1.31 ng/dL (ref 0.76–1.46)

## 2014-01-03 ENCOUNTER — Other Ambulatory Visit: Payer: Self-pay | Admitting: Family Medicine

## 2014-01-10 ENCOUNTER — Other Ambulatory Visit: Payer: Self-pay | Admitting: Family Medicine

## 2014-01-10 NOTE — Telephone Encounter (Signed)
plz phone in. 

## 2014-01-10 NOTE — Telephone Encounter (Signed)
Ok to refill 

## 2014-01-11 ENCOUNTER — Encounter: Payer: Self-pay | Admitting: Family Medicine

## 2014-01-11 DIAGNOSIS — E611 Iron deficiency: Secondary | ICD-10-CM

## 2014-01-11 DIAGNOSIS — E890 Postprocedural hypothyroidism: Secondary | ICD-10-CM

## 2014-01-11 DIAGNOSIS — I10 Essential (primary) hypertension: Secondary | ICD-10-CM

## 2014-01-11 NOTE — Telephone Encounter (Signed)
Plz call to schedule fasting labwork at end of august.

## 2014-01-11 NOTE — Telephone Encounter (Signed)
Rx called in as directed.   

## 2014-01-13 NOTE — Telephone Encounter (Signed)
Appts scheduled:

## 2014-01-16 ENCOUNTER — Telehealth: Payer: Self-pay

## 2014-01-16 DIAGNOSIS — M1990 Unspecified osteoarthritis, unspecified site: Secondary | ICD-10-CM

## 2014-01-16 NOTE — Telephone Encounter (Signed)
Ordered. Thanks.  I am unsure about the availability of rheum before 02/13/14.

## 2014-01-16 NOTE — Telephone Encounter (Signed)
Patient advised.

## 2014-01-16 NOTE — Telephone Encounter (Signed)
Pt request referral to rheumatologist; explained Dr Reece Agar out of office this week but pt said needs urgent referral because she will not have ins after 02/13/14. Pt has OA and lupus; pt has had knee replacement due to lupus and pt is attempting to get disability and lawyer advised pt to get rheumatology consult. Pt request cb.

## 2014-01-26 ENCOUNTER — Ambulatory Visit: Payer: BC Managed Care – PPO | Admitting: Cardiovascular Disease

## 2014-01-27 ENCOUNTER — Encounter: Payer: Self-pay | Admitting: Cardiovascular Disease

## 2014-01-27 ENCOUNTER — Ambulatory Visit (INDEPENDENT_AMBULATORY_CARE_PROVIDER_SITE_OTHER): Payer: BC Managed Care – PPO | Admitting: Cardiovascular Disease

## 2014-01-27 VITALS — BP 110/60 | HR 46 | Ht 70.0 in | Wt 212.5 lb

## 2014-01-27 DIAGNOSIS — R001 Bradycardia, unspecified: Secondary | ICD-10-CM | POA: Insufficient documentation

## 2014-01-27 DIAGNOSIS — I498 Other specified cardiac arrhythmias: Secondary | ICD-10-CM

## 2014-01-27 DIAGNOSIS — R609 Edema, unspecified: Secondary | ICD-10-CM

## 2014-01-27 DIAGNOSIS — R079 Chest pain, unspecified: Secondary | ICD-10-CM

## 2014-01-27 DIAGNOSIS — Z9884 Bariatric surgery status: Secondary | ICD-10-CM

## 2014-01-27 DIAGNOSIS — I10 Essential (primary) hypertension: Secondary | ICD-10-CM

## 2014-01-27 DIAGNOSIS — I471 Supraventricular tachycardia: Secondary | ICD-10-CM

## 2014-01-27 NOTE — Assessment & Plan Note (Addendum)
We have suggested she reduce her metoprolol down to 12.5 mg twice a day if tolerated. Heart rate is very low today. If she has recurrent tachycardia, would increase metoprolol up to 25 mg twice a day

## 2014-01-27 NOTE — Patient Instructions (Signed)
Your heart rate is slow Please cut the metoprolol down to 1/2 pill twice a day If you have fast heart rates/tachycardia, You could take an extra 1/2 mteoprolol, If you havre tachycardia a lot, Increase metoprolol up to 1 pill twice a day  Please cut the benazepril in 1/2 daily, BP is low  Please call us if you have new issues that need to be addressed before your next appt.  Your physician wants you to follow-up in: 12 months.  You will receive a reminder letter in the mail two months in advance. If you don't receive a letter, please call our office to schedule the follow-up appointment.

## 2014-01-27 NOTE — Assessment & Plan Note (Signed)
Blood pressure running on the lower and likely from recent weight loss of 40 pounds. She does report some dizzy symptoms. Suggested she cut her benazepril in half and if symptoms persist, hold her benazepril for now

## 2014-01-27 NOTE — Assessment & Plan Note (Signed)
Recent 40 pound weight loss. Now with lower blood pressure and heart rate

## 2014-01-27 NOTE — Assessment & Plan Note (Signed)
Bradycardia likely from over medication with beta blocker. We will cut back on her metoprolol as above

## 2014-01-27 NOTE — Assessment & Plan Note (Signed)
She denies any recent episodes of chest pain. No further workup at this time

## 2014-01-27 NOTE — Progress Notes (Signed)
Patient ID: Claire Strong, female    DOB: [birth year], 53 y.o.   MRN: [id number]  HPI Comments: 53 year old woman with h/o lupus anticoagulant with remote DVT, HTN, horseshoe kidney, asthma and multinodular goiter s/p thyroidectomy,  s/p roux en y gastric bypass,  post surgical hypertensive/palpitation episode associated with severe HA seen at [facility], who presents for routine evaluation.she has a long history of palpitations, atrial tachycardia. prior Holter monitor showed atrial tachycardia.  In followup today, she reports that she has lost 40 pounds. Overall is doing well. Had a recent total knee replacement on the left Blood pressure has been running lower with her weight loss. Occasional dizzy spells. Heart rate running low as well  She's been taking metoprolol 37.5 mg twice a day with no significant breakthrough tachycardia.  History of varicose veins.   History of brain surgery on the left.   Father with CAD and CABG at 57 yo, was smoker.  EKG shows normal sinus rhythm with rate 46 beats per minute with no significant ST or T wave changes       Outpatient Encounter Prescriptions as of 01/27/2014  Medication Sig  . ACETAMINOPHEN-BUTALBITAL 50-325 MG TABS Take 1 tablet by mouth every 4 (four) hours.  Marland Kitchen albuterol (PROVENTIL HFA;VENTOLIN HFA) 108 (90 BASE) MCG/ACT inhaler Inhale 2 puffs into the lungs every 6 (six) hours as needed.  . ALLERGIST TRAY 1/2CC 27GX1/2" 27G X 1/2" 0.5 ML KIT once a week.   Marland Kitchen aspirin 81 MG tablet Take 81 mg by mouth daily.  . B Complex-C (SUPER B COMPLEX PO) Take 1 capsule by mouth daily.    . benazepril (LOTENSIN) 10 MG tablet TAKE ONE TABLET BY MOUTH ONCE DAILY  . buPROPion (WELLBUTRIN) 75 MG tablet Take 1 tablet (75 mg total) by mouth 2 (two) times daily.  . calcium carbonate (OS-CAL) 600 MG TABS Take 600 mg by mouth daily.  . carisoprodol (SOMA) 350 MG tablet TAKE ONE TABLET BY MOUTH AT BEDTIME  . cetirizine (ZYRTEC) 10 MG tablet Take 10 mg by  mouth daily.  . Cholecalciferol (VITAMIN D3) 1000 UNITS tablet Take 2,000 Units by mouth daily.   . citalopram (CELEXA) 40 MG tablet TAKE ONE TABLET BY MOUTH ONCE DAILY  . diclofenac sodium (VOLTAREN) 1 % GEL Apply 1 application topically 3 (three) times daily.  Marland Kitchen EPINEPHrine (EPI-PEN) 0.3 mg/0.3 mL DEVI Inject 0.3 mLs (0.3 mg total) into the muscle once.  . ferrous sulfate 325 (65 FE) MG tablet Take 325 mg by mouth daily with breakfast.  . fluconazole (DIFLUCAN) 150 MG tablet Take 1 tablet (150 mg total) by mouth once.  . Fluticasone-Salmeterol (ADVAIR DISKUS) 100-50 MCG/DOSE AEPB Inhale 1 puff into the lungs every 12 (twelve) hours.  . furosemide (LASIX) 20 MG tablet Take 1 tablet (20 mg total) by mouth daily.  Marland Kitchen levothyroxine (SYNTHROID, LEVOTHROID) 150 MCG tablet Take 150 mcg by mouth daily before breakfast.  . levothyroxine (SYNTHROID, LEVOTHROID) 75 MCG tablet Take 75 mcg by mouth daily. With 30mg  . Magnesium 250 MG TABS Take 1 tablet by mouth daily.  . metoprolol tartrate (LOPRESSOR) 25 MG tablet Take 1.5 tablets (37.5 mg total) by mouth 2 (two) times daily.  . montelukast (SINGULAIR) 10 MG tablet Take 10 mg by mouth at bedtime.    . Multiple Vitamin (MULTIVITAMIN) tablet Take 1 tablet by mouth daily.    . ondansetron (ZOFRAN) 4 MG tablet Take 4 mg by mouth every 8 (eight) hours as needed.   .Marland Kitchen  Potassium Gluconate 595 MG CAPS Take 1 capsule by mouth daily.  . propranolol (INDERAL) 20 MG tablet Take 1 tablet (20 mg total) by mouth as needed.  . traMADol (ULTRAM) 50 MG tablet Take 1 tablet (50 mg total) by mouth 2 (two) times daily as needed for pain.  . vitamin B-12 (CYANOCOBALAMIN) 1000 MCG tablet Take 1,000 mcg by mouth daily.   Review of Systems  Constitutional: Negative.   HENT: Negative.   Eyes: Negative.   Respiratory: Negative.   Cardiovascular: Negative.   Gastrointestinal: Negative.   Endocrine: Negative.   Musculoskeletal: Negative.   Skin: Negative.    Allergic/Immunologic: Negative.   Neurological: Negative.   Hematological: Negative.   Psychiatric/Behavioral: Negative.   All other systems reviewed and are negative.   BP 110/60  Pulse 46  Ht 5' 10" (1.778 m)  Wt 212 lb 8 oz (96.389 kg)  BMI 30.49 kg/m2  Physical Exam  Nursing note and vitals reviewed. Constitutional: She is oriented to person, place, and time. She appears well-developed and well-nourished.  Obese  HENT:  Head: Normocephalic.  Nose: Nose normal.  Mouth/Throat: Oropharynx is clear and moist.  Eyes: Conjunctivae are normal. Pupils are equal, round, and reactive to light.  Neck: Normal range of motion. Neck supple. No JVD present.  Well healed surgical incision site from thyroidectomy  Cardiovascular: Normal rate, regular rhythm, S1 normal, S2 normal, normal heart sounds and intact distal pulses.  Exam reveals no gallop and no friction rub.   No murmur heard. Pulmonary/Chest: Effort normal and breath sounds normal. No respiratory distress. She has no wheezes. She has no rales. She exhibits no tenderness.  Abdominal: Soft. Bowel sounds are normal. She exhibits no distension. There is no tenderness.  Musculoskeletal: Normal range of motion. She exhibits no edema and no tenderness.  Lymphadenopathy:    She has no cervical adenopathy.  Neurological: She is alert and oriented to person, place, and time. Coordination normal.  Skin: Skin is warm and dry. No rash noted. No erythema.  Psychiatric: She has a normal mood and affect. Her behavior is normal. Judgment and thought content normal.    Assessment and Plan

## 2014-01-27 NOTE — Assessment & Plan Note (Signed)
Edema has significantly improved. Likely better after recent weight loss

## 2014-02-07 ENCOUNTER — Other Ambulatory Visit (INDEPENDENT_AMBULATORY_CARE_PROVIDER_SITE_OTHER): Payer: BC Managed Care – PPO

## 2014-02-07 DIAGNOSIS — E611 Iron deficiency: Secondary | ICD-10-CM

## 2014-02-07 DIAGNOSIS — I10 Essential (primary) hypertension: Secondary | ICD-10-CM

## 2014-02-07 DIAGNOSIS — D509 Iron deficiency anemia, unspecified: Secondary | ICD-10-CM

## 2014-02-07 DIAGNOSIS — E890 Postprocedural hypothyroidism: Secondary | ICD-10-CM

## 2014-02-07 LAB — CBC WITH DIFFERENTIAL/PLATELET
BASOS ABS: 0 10*3/uL (ref 0.0–0.1)
Basophils Relative: 0.5 % (ref 0.0–3.0)
EOS ABS: 0.1 10*3/uL (ref 0.0–0.7)
Eosinophils Relative: 3.3 % (ref 0.0–5.0)
HEMATOCRIT: 39.9 % (ref 36.0–46.0)
HEMOGLOBIN: 13.5 g/dL (ref 12.0–15.0)
LYMPHS ABS: 1.3 10*3/uL (ref 0.7–4.0)
Lymphocytes Relative: 32.1 % (ref 12.0–46.0)
MCHC: 33.8 g/dL (ref 30.0–36.0)
MCV: 89 fl (ref 78.0–100.0)
MONO ABS: 0.2 10*3/uL (ref 0.1–1.0)
Monocytes Relative: 5.2 % (ref 3.0–12.0)
NEUTROS ABS: 2.5 10*3/uL (ref 1.4–7.7)
Neutrophils Relative %: 58.9 % (ref 43.0–77.0)
PLATELETS: 151 10*3/uL (ref 150.0–400.0)
RBC: 4.48 Mil/uL (ref 3.87–5.11)
RDW: 13 % (ref 11.5–15.5)
WBC: 4.2 10*3/uL (ref 4.0–10.5)

## 2014-02-07 LAB — IBC PANEL
Iron: 72 ug/dL (ref 42–145)
SATURATION RATIOS: 26.9 % (ref 20.0–50.0)
TRANSFERRIN: 190.9 mg/dL — AB (ref 212.0–360.0)

## 2014-02-07 LAB — BASIC METABOLIC PANEL
BUN: 15 mg/dL (ref 6–23)
CHLORIDE: 105 meq/L (ref 96–112)
CO2: 28 mEq/L (ref 19–32)
CREATININE: 1 mg/dL (ref 0.4–1.2)
Calcium: 8.5 mg/dL (ref 8.4–10.5)
GFR: 64.74 mL/min (ref >60.00)
Glucose, Bld: 80 mg/dL (ref 70–99)
POTASSIUM: 4.4 meq/L (ref 3.5–5.1)
Sodium: 139 mEq/L (ref 135–145)

## 2014-02-07 LAB — LIPID PANEL
Cholesterol: 160 mg/dL (ref 0–200)
HDL: 43.5 mg/dL (ref >39.00)
LDL CALC: 99 mg/dL (ref 0–99)
NONHDL: 116.5
TRIGLYCERIDES: 86 mg/dL (ref 0.0–149.0)
Total CHOL/HDL Ratio: 4
VLDL: 17.2 mg/dL (ref 0.0–40.0)

## 2014-02-07 LAB — FERRITIN: Ferritin: 25.6 ng/mL (ref 10.0–291.0)

## 2014-02-07 LAB — TSH: TSH: 0.5 u[IU]/mL (ref 0.35–4.50)

## 2014-02-09 ENCOUNTER — Ambulatory Visit (INDEPENDENT_AMBULATORY_CARE_PROVIDER_SITE_OTHER): Payer: BC Managed Care – PPO | Admitting: Family Medicine

## 2014-02-09 ENCOUNTER — Encounter: Payer: Self-pay | Admitting: Family Medicine

## 2014-02-09 VITALS — BP 100/74 | HR 52 | Temp 98.7°F | Ht 70.0 in | Wt 214.8 lb

## 2014-02-09 DIAGNOSIS — D6859 Other primary thrombophilia: Secondary | ICD-10-CM

## 2014-02-09 DIAGNOSIS — Z0000 Encounter for general adult medical examination without abnormal findings: Secondary | ICD-10-CM

## 2014-02-09 DIAGNOSIS — D6862 Lupus anticoagulant syndrome: Secondary | ICD-10-CM

## 2014-02-09 DIAGNOSIS — R002 Palpitations: Secondary | ICD-10-CM

## 2014-02-09 DIAGNOSIS — E890 Postprocedural hypothyroidism: Secondary | ICD-10-CM

## 2014-02-09 DIAGNOSIS — N898 Other specified noninflammatory disorders of vagina: Secondary | ICD-10-CM

## 2014-02-09 DIAGNOSIS — N939 Abnormal uterine and vaginal bleeding, unspecified: Secondary | ICD-10-CM

## 2014-02-09 DIAGNOSIS — I10 Essential (primary) hypertension: Secondary | ICD-10-CM

## 2014-02-09 NOTE — Assessment & Plan Note (Signed)
Actually some low bp's noted. Has been decreasing antihypertensive doses per cardiology supervision.

## 2014-02-09 NOTE — Progress Notes (Signed)
Pre visit review using our clinic review tool, if applicable. No additional management support is needed unless otherwise documented below in the visit note. 

## 2014-02-09 NOTE — Assessment & Plan Note (Addendum)
Well controlled on metoprolol daily. Decrease to 1/2 tab bid. H/o atrial tachycardias.

## 2014-02-09 NOTE — Assessment & Plan Note (Signed)
Resolved

## 2014-02-09 NOTE — Assessment & Plan Note (Signed)
Stable on current regimen   

## 2014-02-09 NOTE — Assessment & Plan Note (Signed)
Recent eval by rheum. Pending results.

## 2014-02-09 NOTE — Patient Instructions (Addendum)
Return as needed or in 1 year for next physical. Start walking some regularly. Flu shot in the next month. Good to see you today, call us with quesitons.

## 2014-02-09 NOTE — Progress Notes (Signed)
BP 100/74  Pulse 52  Temp(Src) 98.7 F (37.1 C) (Oral)  Ht $R'5\' 10"'hB$  (1.778 m)  Wt 214 lb 12 oz (97.41 kg)  BMI 30.81 kg/m2   CC: CPE  Subjective:    Patient ID: Claire Strong, female    DOB: [birth year], 53 y.o.   MRN: [id number]  HPI: Claire Strong is a 53 y.o. female presenting on 02/09/2014 for Annual Exam   Insurance running out on Monday. Wt Readings from Last 3 Encounters:  02/09/14 214 lb 12 oz (97.41 kg)  01/27/14 212 lb 8 oz (96.389 kg)  10/17/13 213 lb (96.616 kg)   Body mass index is 30.81 kg/(m^2).  Recently saw rheumatologist on Thursday - for lupus anticoagulant. Checking for RA.  Recent L knee replacement and L ankle fusion 08/2013 and total thyroidectomy for multinodular goiter 2013.  Recent R cyst removal by derm - benign.   Vaginal spotting from last office visit - thought due to atrophic vaginitis as well as treated for possible candidiasis despite normal wet prep.  Resolved.  Preventative: Last mammogram 06/2013 Birads 1 Last pap smear 2009, always normal. S/p hysterectomy for uterine prolapse 1989. Ovaries remain. no pelvic pain. Persistent hot flashes but controlled well. COLONOSCOPY Date: 05/2012 hyperplastic polyps x2, mild diverticulosis Deatra Ina) rpt 10 yrs Tdap 12/19/2010.  Flu shot will wait. Seat belt use discussed. Sunscreen use discussed.  Caffeine: 1 cup coffee/day  Lives with husband Gwyndolyn Saxon (Bill)) and mother in [employer], youngest daughter. 1 dog.  Edu: 12th grade  Occupation: Web designer  Activity: taking care of grandson, no regular exercise  Diet: good water, fruits/vegetables daily   Relevant past medical, surgical, family and social history reviewed and updated as indicated.  Allergies and medications reviewed and updated. Current Outpatient Prescriptions on File Prior to Visit  Medication Sig  . ACETAMINOPHEN-BUTALBITAL 50-325 MG TABS Take 1 tablet by mouth every 4 (four) hours.  Marland Kitchen albuterol (PROVENTIL  HFA;VENTOLIN HFA) 108 (90 BASE) MCG/ACT inhaler Inhale 2 puffs into the lungs every 6 (six) hours as needed.  . ALLERGIST TRAY 1/2CC 27GX1/2" 27G X 1/2" 0.5 ML KIT once a week.   Marland Kitchen aspirin 81 MG tablet Take 81 mg by mouth daily.  . B Complex-C (SUPER B COMPLEX PO) Take 1 capsule by mouth daily.    Marland Kitchen buPROPion (WELLBUTRIN) 75 MG tablet Take 1 tablet (75 mg total) by mouth 2 (two) times daily.  . calcium carbonate (OS-CAL) 600 MG TABS Take 600 mg by mouth daily.  . carisoprodol (SOMA) 350 MG tablet TAKE ONE TABLET BY MOUTH AT BEDTIME  . cetirizine (ZYRTEC) 10 MG tablet Take 10 mg by mouth daily.  . Cholecalciferol (VITAMIN D3) 1000 UNITS tablet Take 2,000 Units by mouth daily.   . citalopram (CELEXA) 40 MG tablet TAKE ONE TABLET BY MOUTH ONCE DAILY  . diclofenac sodium (VOLTAREN) 1 % GEL Apply 1 application topically 3 (three) times daily.  Marland Kitchen EPINEPHrine (EPI-PEN) 0.3 mg/0.3 mL DEVI Inject 0.3 mLs (0.3 mg total) into the muscle once.  . ferrous sulfate 325 (65 FE) MG tablet Take 325 mg by mouth daily with breakfast.  . Fluticasone-Salmeterol (ADVAIR DISKUS) 100-50 MCG/DOSE AEPB Inhale 1 puff into the lungs every 12 (twelve) hours.  . furosemide (LASIX) 20 MG tablet Take 1 tablet (20 mg total) by mouth daily.  Marland Kitchen levothyroxine (SYNTHROID, LEVOTHROID) 150 MCG tablet Take 150 mcg by mouth daily before breakfast.  . Magnesium 250 MG TABS Take 1 tablet by mouth daily.  Marland Kitchen  montelukast (SINGULAIR) 10 MG tablet Take 10 mg by mouth at bedtime.    . Multiple Vitamin (MULTIVITAMIN) tablet Take 1 tablet by mouth daily.    . ondansetron (ZOFRAN) 4 MG tablet Take 4 mg by mouth every 8 (eight) hours as needed.   . Potassium Gluconate 595 MG CAPS Take 1 capsule by mouth daily.  . traMADol (ULTRAM) 50 MG tablet Take 1 tablet (50 mg total) by mouth 2 (two) times daily as needed for pain.  . vitamin B-12 (CYANOCOBALAMIN) 1000 MCG tablet Take 1,000 mcg by mouth daily.  . propranolol (INDERAL) 20 MG tablet Take 1  tablet (20 mg total) by mouth as needed.   No current facility-administered medications on file prior to visit.    Review of Systems  Constitutional: Negative for fever, chills, activity change, appetite change, fatigue and unexpected weight change.  HENT: Negative for hearing loss.   Eyes: Negative for visual disturbance.  Respiratory: Negative for cough, chest tightness, shortness of breath and wheezing.   Cardiovascular: Negative for chest pain, palpitations and leg swelling.  Gastrointestinal: Negative for nausea, vomiting, abdominal pain, diarrhea, constipation, blood in stool and abdominal distention.  Genitourinary: Negative for hematuria and difficulty urinating.  Musculoskeletal: Negative for arthralgias, myalgias and neck pain.  Skin: Negative for rash.  Neurological: Positive for dizziness (attributed to low blood pressures) and headaches. Negative for seizures and syncope.  Hematological: Negative for adenopathy. Does not bruise/bleed easily.  Psychiatric/Behavioral: Negative for dysphoric mood. The patient is not nervous/anxious.    Per HPI unless specifically indicated above    Objective:    BP 100/74  Pulse 52  Temp(Src) 98.7 F (37.1 C) (Oral)  Ht $R'5\' 10"'yy$  (1.778 m)  Wt 214 lb 12 oz (97.41 kg)  BMI 30.81 kg/m2  Physical Exam  Nursing note and vitals reviewed. Constitutional: She is oriented to person, place, and time. She appears well-developed and well-nourished. No distress.  HENT:  Head: Normocephalic and atraumatic.  Right Ear: Hearing, tympanic membrane, external ear and ear canal normal.  Left Ear: Hearing, tympanic membrane, external ear and ear canal normal.  Nose: Nose normal.  Mouth/Throat: Uvula is midline, oropharynx is clear and moist and mucous membranes are normal. No oropharyngeal exudate, posterior oropharyngeal edema or posterior oropharyngeal erythema.  Eyes: Conjunctivae and EOM are normal. Pupils are equal, round, and reactive to light. No  scleral icterus.  Neck: Normal range of motion. Neck supple. No thyromegaly present.  Cardiovascular: Normal rate, regular rhythm, normal heart sounds and intact distal pulses.   No murmur heard. Pulses:      Radial pulses are 2+ on the right side, and 2+ on the left side.  Pulmonary/Chest: Effort normal and breath sounds normal. No respiratory distress. She has no wheezes. She has no rales.  Breast exam deferred  Abdominal: Soft. Bowel sounds are normal. She exhibits no distension and no mass. There is tenderness (mild chronic at RUQ). There is no rebound and no guarding.  Genitourinary:  Deferred   Musculoskeletal: Normal range of motion. She exhibits no edema.  Lymphadenopathy:    She has no cervical adenopathy.  Neurological: She is alert and oriented to person, place, and time.  CN grossly intact, station and gait intact  Skin: Skin is warm and dry. No rash noted.  Psychiatric: She has a normal mood and affect. Her behavior is normal. Judgment and thought content normal.   Results for orders placed in visit on 02/07/14  LIPID PANEL      Result  Value Ref Range   Cholesterol 160  0 - 200 mg/dL   Triglycerides 86.0  0.0 - 149.0 mg/dL   HDL 43.50  >39.00 mg/dL   VLDL 17.2  0.0 - 40.0 mg/dL   LDL Cholesterol 99  0 - 99 mg/dL   Total CHOL/HDL Ratio 4     NonHDL 099.83    BASIC METABOLIC PANEL      Result Value Ref Range   Sodium 139  135 - 145 mEq/L   Potassium 4.4  3.5 - 5.1 mEq/L   Chloride 105  96 - 112 mEq/L   CO2 28  19 - 32 mEq/L   Glucose, Bld 80  70 - 99 mg/dL   BUN 15  6 - 23 mg/dL   Creatinine, Ser 1.0  0.4 - 1.2 mg/dL   Calcium 8.5  8.4 - 10.5 mg/dL   GFR 64.74  >60.00 mL/min  TSH      Result Value Ref Range   TSH 0.50  0.35 - 4.50 uIU/mL  FERRITIN      Result Value Ref Range   Ferritin 25.6  10.0 - 291.0 ng/mL  IBC PANEL      Result Value Ref Range   Iron 72  42 - 145 ug/dL   Transferrin 190.9 (*) 212.0 - 360.0 mg/dL   Saturation Ratios 26.9  20.0 - 50.0 %   CBC WITH DIFFERENTIAL      Result Value Ref Range   WBC 4.2  4.0 - 10.5 K/uL   RBC 4.48  3.87 - 5.11 Mil/uL   Hemoglobin 13.5  12.0 - 15.0 g/dL   HCT 39.9  36.0 - 46.0 %   MCV 89.0  78.0 - 100.0 fl   MCHC 33.8  30.0 - 36.0 g/dL   RDW 13.0  11.5 - 15.5 %   Platelets 151.0  150.0 - 400.0 K/uL   Neutrophils Relative % 58.9  43.0 - 77.0 %   Lymphocytes Relative 32.1  12.0 - 46.0 %   Monocytes Relative 5.2  3.0 - 12.0 %   Eosinophils Relative 3.3  0.0 - 5.0 %   Basophils Relative 0.5  0.0 - 3.0 %   Neutro Abs 2.5  1.4 - 7.7 K/uL   Lymphs Abs 1.3  0.7 - 4.0 K/uL   Monocytes Absolute 0.2  0.1 - 1.0 K/uL   Eosinophils Absolute 0.1  0.0 - 0.7 K/uL   Basophils Absolute 0.0  0.0 - 0.1 K/uL      Assessment & Plan:   Problem List Items Addressed This Visit   Healthcare maintenance - Primary     Preventative protocols reviewed and updated unless pt declined. Discussed healthy diet and lifestyle.    HTN (hypertension)     Actually some low bp's noted. Has been decreasing antihypertensive doses per cardiology supervision.    Relevant Medications      benazepril (LOTENSIN) 10 MG tablet      metoprolol tartrate (LOPRESSOR) tablet   Hypothyroidism, postsurgical     Stable on current regimen.    Relevant Medications      metoprolol tartrate (LOPRESSOR) tablet   Lupus anticoagulant disorder     Recent eval by rheum. Pending results.    Palpitations     Well controlled on metoprolol daily. Decrease to 1/2 tab bid. H/o atrial tachycardias.    RESOLVED: Vagina bleeding     Resolved.        Follow up plan: Return in about 1 year (around 02/10/2015), or as needed,  for annual exam, prior fasting for blood work.

## 2014-02-09 NOTE — Assessment & Plan Note (Signed)
Preventative protocols reviewed and updated unless pt declined. Discussed healthy diet and lifestyle.  

## 2014-02-14 DIAGNOSIS — M797 Fibromyalgia: Secondary | ICD-10-CM | POA: Insufficient documentation

## 2014-03-12 ENCOUNTER — Encounter: Payer: Self-pay | Admitting: Family Medicine

## 2014-03-28 ENCOUNTER — Ambulatory Visit: Payer: Self-pay | Admitting: Otolaryngology

## 2014-03-28 LAB — T4, FREE: FREE THYROXINE: 1.2 ng/dL (ref 0.76–1.46)

## 2014-03-28 LAB — TSH: Thyroid Stimulating Horm: 0.465 u[IU]/mL

## 2014-04-04 ENCOUNTER — Ambulatory Visit (INDEPENDENT_AMBULATORY_CARE_PROVIDER_SITE_OTHER): Payer: BC Managed Care – PPO

## 2014-04-04 ENCOUNTER — Encounter: Payer: Self-pay | Admitting: Family Medicine

## 2014-04-04 DIAGNOSIS — Z23 Encounter for immunization: Secondary | ICD-10-CM

## 2014-04-05 ENCOUNTER — Other Ambulatory Visit: Payer: Self-pay | Admitting: Family Medicine

## 2014-05-02 ENCOUNTER — Ambulatory Visit: Payer: Self-pay | Admitting: Urology

## 2014-05-10 ENCOUNTER — Encounter: Payer: Self-pay | Admitting: Family Medicine

## 2014-05-10 ENCOUNTER — Ambulatory Visit (INDEPENDENT_AMBULATORY_CARE_PROVIDER_SITE_OTHER): Payer: BC Managed Care – PPO | Admitting: Family Medicine

## 2014-05-10 VITALS — BP 110/80 | HR 72 | Temp 98.2°F | Wt 219.5 lb

## 2014-05-10 DIAGNOSIS — R109 Unspecified abdominal pain: Secondary | ICD-10-CM

## 2014-05-10 NOTE — Assessment & Plan Note (Addendum)
Right sided abd pain of unclear etiology. I doubt this pain is from calcified nodule found on recent CT but I will touch base with radiologist to ensure he agrees ==> discussed with radiologist who agrees, this is likely not causing current pain.  ?residual nodule from remote epiploic appendagitis vs fat necrosis after gallbladder removal. ?adhesion discomfort from gallbladder surgery. ?pain from nonobstructing stone although this is also not likely unless stone is popping in and out of obstruction. Will let urology know.

## 2014-05-10 NOTE — Progress Notes (Signed)
BP 110/80 mmHg  Pulse 72  Temp(Src) 98.2 F (36.8 C) (Oral)  Wt 219 lb 8 oz (99.565 kg)   CC: discuss liver  Subjective:    Patient ID: Claire Strong, female    DOB: February 27, 1961, 53 y.o.   MRN: [id number]  HPI: Claire Strong is a 53 y.o. female presenting on 05/10/2014 for Follow-up   Pt brings in recent CT scan at [facility] obtained for flank pain - calcified nodule next to anterior margin of liver ?epiploic appendagitis sequelae. 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   Endorses sharp R sided stabbing abd pain that last <1 min at a time. pt thought this was stone and has been seeing urologist Zara Council PA. Advised to check with PCP to ensure liver not etiology of pain.   Husband in hospital, to have toe amputation for infection.  Relevant past medical, surgical, family and social history reviewed and updated as indicated.  Allergies and medications reviewed and updated. Current Outpatient Prescriptions on File Prior to Visit  Medication Sig  . ACETAMINOPHEN-BUTALBITAL 50-325 MG TABS Take 1 tablet by mouth every 4 (four) hours.  Marland Kitchen albuterol (PROVENTIL HFA;VENTOLIN HFA) 108 (90 BASE) MCG/ACT inhaler Inhale 2 puffs into the lungs every 6 (six) hours as needed.  . ALLERGIST TRAY 1/2CC 27GX1/2" 27G X 1/2" 0.5 ML KIT once a week.   Marland Kitchen aspirin 81 MG tablet Take 81 mg by mouth daily.  . B Complex-C (SUPER B COMPLEX PO) Take 1 capsule by mouth daily.    . benazepril (LOTENSIN) 10 MG tablet TAKE HALF TABLET (5 mg) BY MOUTH ONCE DAILY  . buPROPion (WELLBUTRIN) 75 MG tablet TAKE ONE TABLET BY MOUTH TWICE DAILY  . calcium carbonate (OS-CAL) 600 MG TABS Take 600 mg by mouth daily.  . carisoprodol (SOMA) 350 MG tablet TAKE ONE TABLET BY MOUTH AT BEDTIME  . cetirizine (ZYRTEC) 10 MG tablet Take 10 mg by mouth daily.  . Cholecalciferol (VITAMIN D3) 1000 UNITS tablet Take 2,000 Units by mouth daily.   . citalopram (CELEXA) 40 MG  tablet TAKE ONE TABLET BY MOUTH ONCE DAILY  . diclofenac sodium (VOLTAREN) 1 % GEL Apply 1 application topically 3 (three) times daily.  Marland Kitchen EPINEPHrine (EPI-PEN) 0.3 mg/0.3 mL DEVI Inject 0.3 mLs (0.3 mg total) into the muscle once.  . ferrous sulfate 325 (65 FE) MG tablet Take 325 mg by mouth daily with breakfast.  . Fluticasone-Salmeterol (ADVAIR DISKUS) 100-50 MCG/DOSE AEPB Inhale 1 puff into the lungs every 12 (twelve) hours.  . furosemide (LASIX) 20 MG tablet Take 1 tablet (20 mg total) by mouth daily.  Marland Kitchen levothyroxine (SYNTHROID, LEVOTHROID) 150 MCG tablet Take 150 mcg by mouth daily before breakfast.  . Magnesium 250 MG TABS Take 1 tablet by mouth daily.  . metoprolol tartrate (LOPRESSOR) 25 MG tablet Take 0.5 tablets (12.5 mg total) by mouth 2 (two) times daily.  . montelukast (SINGULAIR) 10 MG tablet Take 10 mg by mouth at bedtime.    . Multiple Vitamin (MULTIVITAMIN) tablet Take 1 tablet by mouth daily.    . ondansetron (ZOFRAN) 4 MG tablet Take 4 mg by mouth every 8 (eight) hours as needed.   . Potassium Gluconate 595 MG CAPS Take 1 capsule by mouth daily.  . traMADol (ULTRAM) 50 MG tablet Take 1 tablet (50 mg total) by mouth 2 (two) times daily as needed for pain.  . vitamin B-12 (CYANOCOBALAMIN) 1000 MCG tablet Take 1,000 mcg by mouth  daily.  . propranolol (INDERAL) 20 MG tablet Take 1 tablet (20 mg total) by mouth as needed.   No current facility-administered medications on file prior to visit.    Review of Systems Per HPI unless specifically indicated above    Objective:    BP 110/80 mmHg  Pulse 72  Temp(Src) 98.2 F (36.8 C) (Oral)  Wt 219 lb 8 oz (99.565 kg)  Physical Exam  Constitutional: She appears well-developed and well-nourished. No distress.  HENT:  Mouth/Throat: Oropharynx is clear and moist. No oropharyngeal exudate.  Cardiovascular: Normal rate, regular rhythm, normal heart sounds and intact distal pulses.   No murmur heard. Pulmonary/Chest: Effort normal  and breath sounds normal. No respiratory distress. She has no wheezes. She has no rales.  Abdominal: Soft. Normal appearance and bowel sounds are normal. She exhibits no distension and no mass. There is no hepatosplenomegaly. There is tenderness (mild at right flank and RLQ). There is no rigidity, no rebound, no guarding, no CVA tenderness and negative Murphy's sign.  Skin: Skin is warm and dry. No rash noted.  Psychiatric: She has a normal mood and affect.  Nursing note and vitals reviewed.      Assessment & Plan:   Problem List Items Addressed This Visit    Right sided abdominal pain - Primary    Right sided abd pain of unclear etiology. I doubt this pain is from calcified nodule found on recent CT but I will touch base with radiologist to ensure he agrees ==> discussed with radiologist who agrees, this is likely not causing current pain.  ?residual nodule from remote epiploic appendagitis vs fat necrosis after gallbladder removal. ?adhesion discomfort from gallbladder surgery. ?pain from nonobstructing stone although this is also not likely unless stone is popping in and out of obstruction. Will let urology know.        Follow up plan: Return if symptoms worsen or fail to improve.

## 2014-05-10 NOTE — Patient Instructions (Signed)
I don't think pain is due to nodule found on CT. I will check with radiologist I will send today's note to [facility].

## 2014-05-10 NOTE — Progress Notes (Signed)
Pre visit review using our clinic review tool, if applicable. No additional management support is needed unless otherwise documented below in the visit note. 

## 2014-05-12 ENCOUNTER — Other Ambulatory Visit: Payer: Self-pay | Admitting: Family Medicine

## 2014-05-15 ENCOUNTER — Telehealth: Payer: Self-pay

## 2014-05-15 NOTE — Telephone Encounter (Signed)
Pt wanted status of paperwork going to [facility]; pt said needs documentation Dr Reece Agar does not think her problem is liver related.

## 2014-05-15 NOTE — Telephone Encounter (Signed)
Notified patient that paperwork was faxed last week. She advised that office did not receive it. Refaxed.

## 2014-06-11 ENCOUNTER — Other Ambulatory Visit: Payer: Self-pay | Admitting: Family Medicine

## 2014-06-21 ENCOUNTER — Telehealth: Payer: Self-pay | Admitting: Family Medicine

## 2014-06-21 NOTE — Telephone Encounter (Signed)
Pt dropped off surgical clearance form.  I let pt know she may need to come in for an office visit On kim's desk

## 2014-06-21 NOTE — Telephone Encounter (Signed)
In your IN box for completion.  

## 2014-06-24 NOTE — Telephone Encounter (Signed)
plz schedule OV - needs eval w/in 30 d from surgery per clearance form.

## 2014-06-27 NOTE — Telephone Encounter (Signed)
Message left for patient to call and schedule surgical clearance visit.

## 2014-06-28 ENCOUNTER — Encounter: Payer: Self-pay | Admitting: Family Medicine

## 2014-06-28 ENCOUNTER — Telehealth: Payer: Self-pay | Admitting: Family Medicine

## 2014-06-28 ENCOUNTER — Ambulatory Visit (INDEPENDENT_AMBULATORY_CARE_PROVIDER_SITE_OTHER): Payer: BLUE CROSS/BLUE SHIELD | Admitting: Family Medicine

## 2014-06-28 VITALS — BP 124/76 | HR 52 | Temp 98.0°F | Wt 215.8 lb

## 2014-06-28 DIAGNOSIS — Z01818 Encounter for other preprocedural examination: Secondary | ICD-10-CM

## 2014-06-28 DIAGNOSIS — E890 Postprocedural hypothyroidism: Secondary | ICD-10-CM

## 2014-06-28 DIAGNOSIS — I10 Essential (primary) hypertension: Secondary | ICD-10-CM

## 2014-06-28 DIAGNOSIS — D6862 Lupus anticoagulant syndrome: Secondary | ICD-10-CM

## 2014-06-28 DIAGNOSIS — J4520 Mild intermittent asthma, uncomplicated: Secondary | ICD-10-CM

## 2014-06-28 DIAGNOSIS — I471 Supraventricular tachycardia: Secondary | ICD-10-CM

## 2014-06-28 LAB — CBC WITH DIFFERENTIAL/PLATELET
BASOS ABS: 0 10*3/uL (ref 0.0–0.1)
BASOS PCT: 0.5 % (ref 0.0–3.0)
EOS PCT: 2.2 % (ref 0.0–5.0)
Eosinophils Absolute: 0.1 10*3/uL (ref 0.0–0.7)
HCT: 39.2 % (ref 36.0–46.0)
Hemoglobin: 13.1 g/dL (ref 12.0–15.0)
Lymphocytes Relative: 37.8 % (ref 12.0–46.0)
Lymphs Abs: 1.7 10*3/uL (ref 0.7–4.0)
MCHC: 33.4 g/dL (ref 30.0–36.0)
MCV: 89.2 fl (ref 78.0–100.0)
MONOS PCT: 5.6 % (ref 3.0–12.0)
Monocytes Absolute: 0.2 10*3/uL (ref 0.1–1.0)
NEUTROS PCT: 53.9 % (ref 43.0–77.0)
Neutro Abs: 2.4 10*3/uL (ref 1.4–7.7)
PLATELETS: 166 10*3/uL (ref 150.0–400.0)
RBC: 4.39 Mil/uL (ref 3.87–5.11)
RDW: 12.5 % (ref 11.5–15.5)
WBC: 4.4 10*3/uL (ref 4.0–10.5)

## 2014-06-28 LAB — BASIC METABOLIC PANEL
BUN: 14 mg/dL (ref 6–23)
CO2: 26 meq/L (ref 19–32)
Calcium: 8.6 mg/dL (ref 8.4–10.5)
Chloride: 107 mEq/L (ref 96–112)
Creatinine, Ser: 0.83 mg/dL (ref 0.40–1.20)
GFR: 76.46 mL/min (ref >60.00)
GLUCOSE: 87 mg/dL (ref 70–99)
Potassium: 4.2 mEq/L (ref 3.5–5.1)
Sodium: 138 mEq/L (ref 135–145)

## 2014-06-28 LAB — PROTIME-INR
INR: 1 ratio (ref 0.8–1.0)
Prothrombin Time: 10.9 s (ref 9.6–13.1)

## 2014-06-28 NOTE — Assessment & Plan Note (Signed)
Stable, no recent flare. No need for CXR. Rare albuterol inhaler use.

## 2014-06-28 NOTE — Progress Notes (Signed)
Pre visit review using our clinic review tool, if applicable. No additional management support is needed unless otherwise documented below in the visit note. 

## 2014-06-28 NOTE — Assessment & Plan Note (Signed)
Stable on current regimen - followed by ENT. Per patient recent TSH WNL, so will not check today.

## 2014-06-28 NOTE — Assessment & Plan Note (Addendum)
H/o this. Stable on once daily metoprolol tartrate 25mg  however marked bradycardia noted today. Will decrease metoprolol to 12.5mg  once daily in am and continue perioperatively.

## 2014-06-28 NOTE — Telephone Encounter (Signed)
I spoke with patient at office visit

## 2014-06-28 NOTE — Assessment & Plan Note (Signed)
Stable. Continue aspirin perioperatively.

## 2014-06-28 NOTE — Assessment & Plan Note (Addendum)
Low risk procedure, low risk patient. Acceptable risk to proceed with upcoming podiatric surgery. rec continue aspirin and metoprolol perioperatively and quick ambulation post op.  EKG today - marked sinus bradycardia (after metoprolol 25mg  this morning). Normal axis, intervals, no acute ST/T changes. will have her decrease metoprolol to 12.5mg  once daily.

## 2014-06-28 NOTE — Telephone Encounter (Signed)
plz notify patient - given marked bradycardia on EKG today recommend further decrease metoprolol to 12.5mg  once daily in am. Also ensure she is not concomitantly taking propranolol (if not taking at all would remove from med list).

## 2014-06-28 NOTE — Patient Instructions (Addendum)
EKG today. labwork today. We will fax clearance to podiatry today. Best of luck in upcoming surgery!

## 2014-06-28 NOTE — Progress Notes (Signed)
BP 124/76 mmHg  Pulse 52  Temp(Src) 98 F (36.7 C) (Oral)  Wt 215 lb 12 oz (97.864 kg)   CC: preop eval  Subjective:    Patient ID: Claire Strong, female    DOB: Jan 11, 1961, 54 y.o.   MRN: [id number]  HPI: Claire Strong is a 54 y.o. female presenting on 06/28/2014 for Medical Clearance   Patient presents today for clearance for upcoming podiatric surgery of low risk under GETA 07/19/2014 with Dr Vickki Muff (hammer toes 2-5 on left). She has tolerated several surgeries under GETA, latest left ankle fusion 05/2013 and then left total knee replacement 08/2013.   Caterina has known lupus anticoagulant disorder with DVT after first pregnancy on aspirin 81mg  daily, known kidney stones in horseshoe kidney, hypertension, multinodular goiter s/p total thyroidectomy now on levothyroxine 131mcg daily, bronchial asthma, and chronic venous insufficiency, as well as osteoarthritis and fibromyalgia. She also is obese with BMI 30 s/p Roux-en-Y gastric bypass 2006. Atrial tachycardia controlled on low dose metoprolol. She takes 25mg  in am only, states when took night time pill had severe bradycardia in am.  Denies recent chest pain, dyspnea, palpitations. Exercises walking daily 2.5 blocks - no dyspnea. Able to walk up several flights of stairs without dyspnea. No recent asthma flare - rare albuterol use. Weight loss has significantly helped dyspnea - none recently.  Relevant past medical, surgical, family and social history reviewed and updated as indicated. Interim medical history since our last visit reviewed. Allergies and medications reviewed and updated. Current Outpatient Prescriptions on File Prior to Visit  Medication Sig  . ACETAMINOPHEN-BUTALBITAL 50-325 MG TABS Take 1 tablet by mouth every 4 (four) hours.  Marland Kitchen albuterol (PROVENTIL HFA;VENTOLIN HFA) 108 (90 BASE) MCG/ACT inhaler Inhale 2 puffs into the lungs every 6 (six) hours as needed.  Marland Kitchen aspirin 81 MG tablet Take 81 mg by mouth daily.  .  B Complex-C (SUPER B COMPLEX PO) Take 1 capsule by mouth daily.    . benazepril (LOTENSIN) 10 MG tablet TAKE ONE TABLET BY MOUTH ONCE DAILY  . buPROPion (WELLBUTRIN) 75 MG tablet TAKE ONE TABLET BY MOUTH TWICE DAILY  . carisoprodol (SOMA) 350 MG tablet TAKE ONE TABLET BY MOUTH AT BEDTIME  . cetirizine (ZYRTEC) 10 MG tablet Take 10 mg by mouth daily.  . Cholecalciferol (VITAMIN D3) 1000 UNITS tablet Take 2,000 Units by mouth daily.   . citalopram (CELEXA) 40 MG tablet TAKE ONE TABLET BY MOUTH ONCE DAILY  . diclofenac sodium (VOLTAREN) 1 % GEL Apply 1 application topically 3 (three) times daily.  Marland Kitchen EPINEPHrine (EPI-PEN) 0.3 mg/0.3 mL DEVI Inject 0.3 mLs (0.3 mg total) into the muscle once.  . ferrous sulfate 325 (65 FE) MG tablet Take 325 mg by mouth daily with breakfast.  . Fluticasone-Salmeterol (ADVAIR DISKUS) 100-50 MCG/DOSE AEPB Inhale 1 puff into the lungs every 12 (twelve) hours.  . furosemide (LASIX) 20 MG tablet TAKE ONE TABLET BY MOUTH ONCE DAILY  . levothyroxine (SYNTHROID, LEVOTHROID) 150 MCG tablet Take 150 mcg by mouth daily before breakfast.  . Magnesium 250 MG TABS Take 1 tablet by mouth daily.  . metoprolol tartrate (LOPRESSOR) 25 MG tablet Take 12.5 mg by mouth daily.   . montelukast (SINGULAIR) 10 MG tablet Take 10 mg by mouth at bedtime.    . Multiple Vitamin (MULTIVITAMIN) tablet Take 1 tablet by mouth daily.    . ondansetron (ZOFRAN) 4 MG tablet Take 4 mg by mouth every 8 (eight) hours as needed.   Marland Kitchen  Potassium Gluconate 595 MG CAPS Take 1 capsule by mouth daily.  . traMADol (ULTRAM) 50 MG tablet Take 1 tablet (50 mg total) by mouth 2 (two) times daily as needed for pain.  . vitamin B-12 (CYANOCOBALAMIN) 1000 MCG tablet Take 1,000 mcg by mouth daily.   No current facility-administered medications on file prior to visit.   Past Medical History  Diagnosis Date  . Lupus anticoagulant disorder   . Osteoarthritis     ?FM by rheum  . Depression   . HTN (hypertension)   .  Kidney stones     s/p renal hematoma after lithotripsy on right  . Horseshoe kidney     sole, R kidney damage  . 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 Jacqulyn Ducking  . History of gastric ulcer 2009  . History of DVT (deep vein thrombosis)     DVTs after 1st pregnancy, not on AC 2/2 bleeding ulcer, greenfield filter in place  . Nasal septal perforation     chronic, ENT rec avoid antihistamine, INS  . Seasonal allergies     allergy shots, singulair  . Multinodular goiter     s/p thyroidectomy  . Bronchial asthma   . Post-surgical hypothyroidism     for multinodular goiter  . Diverticulosis     mild by colonoscopy  . 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 Trula Ore ortho)  . Chronic venous insufficiency 2007    s/p vein stripping and laser ablation  . Fibromyalgia 02/2014    [place]    Past Surgical History  Procedure Laterality Date  . Gastric bypass  05/21/05    Dr. Jacqulyn Ducking, Roux-en-Y  . Partial hysterectomy  1989    ovaries remain  . Back surgery  2009  . Cholecystectomy  1998  . Appendectomy  1978  . Tonsillectomy  1969  . Nasal septum surgery  1981    deviated septum, repaired at [facility]  . Total thyroidectomy  09/2011    benign path (done for multinodular goiter concern for cancer)  . Colonoscopy  05/2012    hyperplastic polyps x2, mild diverticulosis Arlyce Dice) rpt 10 yrs  . Skin surgery      basel cell  . Cyst excision      back  and shoulder  . Total knee arthroplasty Left 08/2013  . Ankle fusion Left 05/2013  . Greenfield filter removal  2015    removed 6 wks after surgery    Review of Systems Per HPI unless specifically indicated above     Objective:    BP 124/76 mmHg  Pulse 52  Temp(Src) 98 F (36.7 C) (Oral)  Wt 215 lb 12 oz (97.864 kg)  Wt Readings from Last 3 Encounters:  06/28/14 215 lb 12 oz (97.864 kg)  05/10/14 219 lb 8 oz (99.565 kg)  02/09/14 214 lb 12 oz (97.41 kg)   Body mass index is 30.96 kg/(m^2).   Physical  Exam  Constitutional: She appears well-developed and well-nourished. No distress.  HENT:  Mouth/Throat: Oropharynx is clear and moist. No oropharyngeal exudate.  Eyes: Conjunctivae and EOM are normal. Pupils are equal, round, and reactive to light. No scleral icterus.  Neck: Normal range of motion. Neck supple. No thyromegaly present.  Cardiovascular: Normal rate, regular rhythm, normal heart sounds and intact distal pulses.   No murmur heard. Pulmonary/Chest: Effort normal and breath sounds normal. No respiratory distress. She has no wheezes. She has no rales.  Musculoskeletal: She exhibits no  edema.  Lymphadenopathy:    She has no cervical adenopathy.  Skin: Skin is warm and dry. No rash noted.  Psychiatric: She has a normal mood and affect.  Nursing note and vitals reviewed.  Results for orders placed or performed in visit on 02/07/14  Lipid panel  Result Value Ref Range   Cholesterol 160 0 - 200 mg/dL   Triglycerides 86.0 0.0 - 149.0 mg/dL   HDL 43.50 >39.00 mg/dL   VLDL 17.2 0.0 - 40.0 mg/dL   LDL Cholesterol 99 0 - 99 mg/dL   Total CHOL/HDL Ratio 4    NonHDL 130.86   Basic metabolic panel  Result Value Ref Range   Sodium 139 135 - 145 mEq/L   Potassium 4.4 3.5 - 5.1 mEq/L   Chloride 105 96 - 112 mEq/L   CO2 28 19 - 32 mEq/L   Glucose, Bld 80 70 - 99 mg/dL   BUN 15 6 - 23 mg/dL   Creatinine, Ser 1.0 0.4 - 1.2 mg/dL   Calcium 8.5 8.4 - 10.5 mg/dL   GFR 64.74 >60.00 mL/min  TSH  Result Value Ref Range   TSH 0.50 0.35 - 4.50 uIU/mL  Ferritin  Result Value Ref Range   Ferritin 25.6 10.0 - 291.0 ng/mL  IBC panel  Result Value Ref Range   Iron 72 42 - 145 ug/dL   Transferrin 190.9 (L) 212.0 - 360.0 mg/dL   Saturation Ratios 26.9 20.0 - 50.0 %  CBC with Differential  Result Value Ref Range   WBC 4.2 4.0 - 10.5 K/uL   RBC 4.48 3.87 - 5.11 Mil/uL   Hemoglobin 13.5 12.0 - 15.0 g/dL   HCT 39.9 36.0 - 46.0 %   MCV 89.0 78.0 - 100.0 fl   MCHC 33.8 30.0 - 36.0 g/dL    RDW 13.0 11.5 - 15.5 %   Platelets 151.0 150.0 - 400.0 K/uL   Neutrophils Relative % 58.9 43.0 - 77.0 %   Lymphocytes Relative 32.1 12.0 - 46.0 %   Monocytes Relative 5.2 3.0 - 12.0 %   Eosinophils Relative 3.3 0.0 - 5.0 %   Basophils Relative 0.5 0.0 - 3.0 %   Neutro Abs 2.5 1.4 - 7.7 K/uL   Lymphs Abs 1.3 0.7 - 4.0 K/uL   Monocytes Absolute 0.2 0.1 - 1.0 K/uL   Eosinophils Absolute 0.1 0.0 - 0.7 K/uL   Basophils Absolute 0.0 0.0 - 0.1 K/uL      Assessment & Plan:   Problem List Items Addressed This Visit    Pre-operative clearance - Primary    Low risk procedure, low risk patient. Acceptable risk to proceed with upcoming podiatric surgery. rec continue aspirin and metoprolol perioperatively and quick ambulation post op.  EKG today - marked sinus bradycardia (after metoprolol 25mg  this morning). Normal axis, intervals, no acute ST/T changes. will have her decrease metoprolol to 12.5mg  once daily.      Relevant Orders   EKG 12-Lead (Completed)   Basic metabolic panel   CBC with Differential   Protime-INR   EKG 12-Lead (Completed)   Lupus anticoagulant disorder    Stable. Continue aspirin perioperatively.      Relevant Orders   CBC with Differential   Hypothyroidism, postsurgical    Stable on current regimen - followed by ENT. Per patient recent TSH WNL, so will not check today.      HTN (hypertension)   Relevant Orders   Basic metabolic panel   Bronchial asthma    Stable, no recent  flare. No need for CXR. Rare albuterol inhaler use.      Atrial tachycardia    H/o this. Stable on once daily metoprolol tartrate 25mg  however marked bradycardia noted today. Will decrease metoprolol to 12.5mg  once daily in am and continue perioperatively.          Follow up plan: Return as needed.

## 2014-07-04 ENCOUNTER — Ambulatory Visit: Payer: Self-pay | Admitting: Urology

## 2014-07-04 ENCOUNTER — Telehealth: Payer: Self-pay

## 2014-07-04 NOTE — Telephone Encounter (Addendum)
Pt left v/m; spoke with Selena Batten and she has taken care of EKG to [facility].

## 2014-07-05 ENCOUNTER — Ambulatory Visit: Payer: Self-pay | Admitting: Urology

## 2014-07-05 LAB — URINALYSIS, COMPLETE
Bacteria: NONE SEEN
Bilirubin,UR: NEGATIVE
Blood: NEGATIVE
Glucose,UR: NEGATIVE mg/dL (ref 0–75)
KETONE: NEGATIVE
Leukocyte Esterase: NEGATIVE
Nitrite: NEGATIVE
PH: 5 (ref 4.5–8.0)
Protein: NEGATIVE
RBC,UR: <1 /HPF (ref 0–5)
SPECIFIC GRAVITY: 1.017 (ref 1.003–1.030)
Squamous Epithelial: 1

## 2014-07-10 ENCOUNTER — Encounter: Payer: Self-pay | Admitting: Family Medicine

## 2014-07-19 ENCOUNTER — Ambulatory Visit: Payer: Self-pay | Admitting: Podiatry

## 2014-08-08 ENCOUNTER — Ambulatory Visit: Payer: Self-pay | Admitting: Family Medicine

## 2014-08-08 ENCOUNTER — Ambulatory Visit: Payer: Self-pay | Admitting: Physician Assistant

## 2014-08-08 ENCOUNTER — Ambulatory Visit: Payer: Self-pay | Admitting: Urology

## 2014-09-04 ENCOUNTER — Other Ambulatory Visit: Payer: Self-pay | Admitting: Family Medicine

## 2014-09-04 NOTE — Telephone Encounter (Signed)
plz phone in. 

## 2014-09-04 NOTE — Telephone Encounter (Signed)
Rx called in as directed.   

## 2014-09-04 NOTE — Telephone Encounter (Signed)
Ok to refill 

## 2014-09-14 ENCOUNTER — Telehealth: Payer: Self-pay

## 2014-09-14 NOTE — Telephone Encounter (Signed)
Patient returned your call.

## 2014-09-14 NOTE — Telephone Encounter (Signed)
Spoke with patient. She was going to have plastic surgery to remove excess skin on abd area and at the same time have a hernia repaired that the plastic surgeon found. However, her insurance denied the plastic surgery and she still needs to have the hernia repaired due to pain. She wants to know if she needs to some in and see you for a referral to a surgeon or if you can just refer her. She can't remember if you have ever addressed this or felt it before. She said it's LUQ. I advised if you haven't ever addressed it that you will need to see her. She verbalized understanding.

## 2014-09-14 NOTE — Telephone Encounter (Signed)
Pt left v/m returning call and request cb [phone]. Unable to reach pt by phone.

## 2014-09-15 NOTE — Telephone Encounter (Signed)
Message left advising patient to call and schedule appt.

## 2014-09-15 NOTE — Telephone Encounter (Signed)
Let's schedule ov for evaluation. Thanks.

## 2014-09-20 ENCOUNTER — Ambulatory Visit (INDEPENDENT_AMBULATORY_CARE_PROVIDER_SITE_OTHER): Payer: BLUE CROSS/BLUE SHIELD | Admitting: Family Medicine

## 2014-09-20 ENCOUNTER — Encounter: Payer: Self-pay | Admitting: Family Medicine

## 2014-09-20 VITALS — BP 118/64 | HR 60 | Temp 98.9°F | Wt 220.2 lb

## 2014-09-20 DIAGNOSIS — K439 Ventral hernia without obstruction or gangrene: Secondary | ICD-10-CM | POA: Diagnosis not present

## 2014-09-20 NOTE — Patient Instructions (Signed)
Pass by [name]'s office for referral to gen surgeon.

## 2014-09-20 NOTE — Progress Notes (Signed)
BP 118/64 mmHg  Pulse 60  Temp(Src) 98.9 F (37.2 C) (Oral)  Wt 220 lb 4 oz (99.905 kg)   CC: hernia referral  Subjective:    Patient ID: Claire Strong, female    DOB: Oct 23, 1960, 54 y.o.   MRN: [id number]  HPI: Claire Strong is a 54 y.o. female presenting on 09/20/2014 for Referral   Recent plastic surgeon (Dr Juleen China in [facility]) eval for LUQ/epigastric hernia repair planned along with abdominoplasty - but this was not approved by insurance. However with persistent pain at site of hernia. Requests referral to gen surgery today for further evaluation of hernia.   Ongoing epigastric abdominal pain when bending over for last year "feels like knife". Denies abd pain otherwise, nausea/vomiting, bowel changes. No strangulation sxs.  Has seen Dr Michela Pitcher in past - for umbilical hernias.  Relevant past medical, surgical, family and social history reviewed and updated as indicated. Interim medical history since our last visit reviewed. Allergies and medications reviewed and updated. Current Outpatient Prescriptions on File Prior to Visit  Medication Sig  . ACETAMINOPHEN-BUTALBITAL 50-325 MG TABS Take 1 tablet by mouth every 4 (four) hours.  Marland Kitchen albuterol (PROVENTIL HFA;VENTOLIN HFA) 108 (90 BASE) MCG/ACT inhaler Inhale 2 puffs into the lungs every 6 (six) hours as needed.  Marland Kitchen aspirin 81 MG tablet Take 81 mg by mouth daily.  . B Complex-C (SUPER B COMPLEX PO) Take 1 capsule by mouth daily.    . benazepril (LOTENSIN) 10 MG tablet TAKE ONE TABLET BY MOUTH ONCE DAILY  . buPROPion (WELLBUTRIN) 75 MG tablet TAKE ONE TABLET BY MOUTH TWICE DAILY (Patient taking differently: TAKE ONE TABLET BY MOUTH TWICE DAILY PRN)  . carisoprodol (SOMA) 350 MG tablet TAKE ONE TABLET BY MOUTH AT BEDTIME  . cetirizine (ZYRTEC) 10 MG tablet Take 10 mg by mouth daily.  . Cholecalciferol (VITAMIN D3) 1000 UNITS tablet Take 2,000 Units by mouth daily.   . citalopram (CELEXA) 40 MG tablet TAKE ONE TABLET BY MOUTH  ONCE DAILY  . diclofenac sodium (VOLTAREN) 1 % GEL Apply 1 application topically 3 (three) times daily. (Patient taking differently: Apply 1 application topically 3 (three) times daily as needed. )  . EPINEPHrine (EPI-PEN) 0.3 mg/0.3 mL DEVI Inject 0.3 mLs (0.3 mg total) into the muscle once.  . Fluticasone-Salmeterol (ADVAIR DISKUS) 100-50 MCG/DOSE AEPB Inhale 1 puff into the lungs every 12 (twelve) hours. (Patient taking differently: Inhale 1 puff into the lungs 2 (two) times daily as needed. )  . furosemide (LASIX) 20 MG tablet TAKE ONE TABLET BY MOUTH ONCE DAILY  . levothyroxine (SYNTHROID, LEVOTHROID) 150 MCG tablet Take 150 mcg by mouth daily before breakfast.  . Magnesium 250 MG TABS Take 1 tablet by mouth daily.  . metoprolol tartrate (LOPRESSOR) 25 MG tablet Take 25 mg by mouth 2 (two) times daily.   . montelukast (SINGULAIR) 10 MG tablet Take 10 mg by mouth at bedtime.    . ondansetron (ZOFRAN) 4 MG tablet Take 4 mg by mouth every 8 (eight) hours as needed.   . Potassium Gluconate 595 MG CAPS Take 1 capsule by mouth daily.  . traMADol (ULTRAM) 50 MG tablet Take 1 tablet (50 mg total) by mouth 2 (two) times daily as needed for pain.  . vitamin B-12 (CYANOCOBALAMIN) 1000 MCG tablet Take 1,000 mcg by mouth daily.  . ferrous sulfate 325 (65 FE) MG tablet Take 325 mg by mouth daily with breakfast.  . Multiple Vitamin (MULTIVITAMIN) tablet Take 1 tablet  by mouth daily.     No current facility-administered medications on file prior to visit.   Past Medical History  Diagnosis Date  . Lupus anticoagulant disorder   . Osteoarthritis     ?FM by rheum  . Depression   . HTN (hypertension)   . Kidney stones     s/p renal hematoma after lithotripsy on right  . Horseshoe kidney     sole, R kidney damage  . 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 Jacqulyn Ducking  . History of gastric ulcer 2009  . History of DVT (deep vein thrombosis)     DVTs after 1st pregnancy, not on AC 2/2 bleeding ulcer,  greenfield filter in place  . Nasal septal perforation     chronic, ENT rec avoid antihistamine, INS  . Seasonal allergies     allergy shots, singulair  . Multinodular goiter     s/p thyroidectomy  . Bronchial asthma   . Post-surgical hypothyroidism     for multinodular goiter  . Diverticulosis     mild by colonoscopy  . 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 Trula Ore ortho)  . Chronic venous insufficiency 2007    s/p vein stripping and laser ablation  . Fibromyalgia 02/2014    [place]    Past Surgical History  Procedure Laterality Date  . Gastric bypass  05/21/05    Dr. Jacqulyn Ducking, Roux-en-Y  . Partial hysterectomy  1989    ovaries remain  . Back surgery  2009  . Cholecystectomy  1998  . Appendectomy  1978  . Tonsillectomy  1969  . Nasal septum surgery  1981    deviated septum, repaired at [facility]  . Total thyroidectomy  09/2011    benign path (done for multinodular goiter concern for cancer)  . Colonoscopy  05/2012    hyperplastic polyps x2, mild diverticulosis Arlyce Dice) rpt 10 yrs  . Skin surgery      basel cell  . Cyst excision      back  and shoulder  . Total knee arthroplasty Left 08/2013  . Ankle fusion Left 05/2013  . Greenfield filter removal  2015    removed 6 wks after surgery   Review of Systems Per HPI unless specifically indicated above     Objective:    BP 118/64 mmHg  Pulse 60  Temp(Src) 98.9 F (37.2 C) (Oral)  Wt 220 lb 4 oz (99.905 kg)  Wt Readings from Last 3 Encounters:  09/20/14 220 lb 4 oz (99.905 kg)  06/28/14 215 lb 12 oz (97.864 kg)  05/10/14 219 lb 8 oz (99.565 kg)    Physical Exam  Constitutional: She appears well-developed and well-nourished. No distress.  Abdominal: Soft. Normal appearance and bowel sounds are normal. She exhibits no distension and no mass. There is no hepatosplenomegaly. There is tenderness (epigastric mild). There is no rigidity, no rebound, no guarding, no CVA tenderness and negative  Murphy's sign. A hernia (small epigastric defect felt, tender) is present.  Psychiatric: She has a normal mood and affect.  Nursing note and vitals reviewed.  Results for orders placed or performed in visit on 06/28/14  Basic metabolic panel  Result Value Ref Range   Sodium 138 135 - 145 mEq/L   Potassium 4.2 3.5 - 5.1 mEq/L   Chloride 107 96 - 112 mEq/L   CO2 26 19 - 32 mEq/L   Glucose, Bld 87 70 - 99 mg/dL   BUN 14 6 - 23  mg/dL   Creatinine, Ser 0.38 0.40 - 1.20 mg/dL   Calcium 8.6 8.4 - 03.0 mg/dL   GFR 56.54 >99.67 mL/min  CBC with Differential  Result Value Ref Range   WBC 4.4 4.0 - 10.5 K/uL   RBC 4.39 3.87 - 5.11 Mil/uL   Hemoglobin 13.1 12.0 - 15.0 g/dL   HCT 98.0 91.3 - 83.9 %   MCV 89.2 78.0 - 100.0 fl   MCHC 33.4 30.0 - 36.0 g/dL   RDW 19.7 37.6 - 22.3 %   Platelets 166.0 150.0 - 400.0 K/uL   Neutrophils Relative % 53.9 43.0 - 77.0 %   Lymphocytes Relative 37.8 12.0 - 46.0 %   Monocytes Relative 5.6 3.0 - 12.0 %   Eosinophils Relative 2.2 0.0 - 5.0 %   Basophils Relative 0.5 0.0 - 3.0 %   Neutro Abs 2.4 1.4 - 7.7 K/uL   Lymphs Abs 1.7 0.7 - 4.0 K/uL   Monocytes Absolute 0.2 0.1 - 1.0 K/uL   Eosinophils Absolute 0.1 0.0 - 0.7 K/uL   Basophils Absolute 0.0 0.0 - 0.1 K/uL  Protime-INR  Result Value Ref Range   INR 1.0 0.8 - 1.0 ratio   Prothrombin Time 10.9 9.6 - 13.1 sec      Assessment & Plan:   Problem List Items Addressed This Visit    Epigastric hernia - Primary    Referral to gen surgery for further eval/treatment. Pt agrees with plan.      Relevant Orders   Ambulatory referral to General Surgery       Follow up plan: Return as needed.

## 2014-09-20 NOTE — Assessment & Plan Note (Signed)
Referral to gen surgery for further eval/treatment. Pt agrees with plan.

## 2014-09-20 NOTE — Progress Notes (Signed)
Pre visit review using our clinic review tool, if applicable. No additional management support is needed unless otherwise documented below in the visit note. 

## 2014-09-22 ENCOUNTER — Ambulatory Visit
Admit: 2014-09-22 | Disposition: A | Discharge status: Home / Self Care | Payer: Self-pay | Attending: Otolaryngology | Admitting: Otolaryngology

## 2014-09-22 LAB — TSH: THYROID STIMULATING HORM: 0.669 u[IU]/mL

## 2014-09-22 LAB — T4, FREE: FREE THYROXINE: 0.98 ng/dL

## 2014-09-25 ENCOUNTER — Telehealth: Payer: Self-pay

## 2014-09-25 MED ORDER — FLUCONAZOLE 150 MG PO TABS: 150.0000 mg | ORAL_TABLET | Freq: Once | ORAL | Status: DC

## 2014-09-25 NOTE — Telephone Encounter (Signed)
Pt took abx in 07/2014 after foot surgery and trying OTC med for yeast infection with no results. Presently have vaginal discharge; pt does not have sensation to tell if itching. [facility] [place].Please advise. Pt last seen 09/20/2014.

## 2014-09-25 NOTE — Telephone Encounter (Signed)
Pt left v/m requesting refill for diflucan to [facility] in [place] with no further info. Left v/m requesting cb for further info.

## 2014-09-25 NOTE — Telephone Encounter (Signed)
Sent in

## 2014-09-28 ENCOUNTER — Encounter: Payer: Self-pay | Admitting: Family Medicine

## 2014-09-29 MED ORDER — FLUCONAZOLE 150 MG PO TABS: 150.0000 mg | ORAL_TABLET | Freq: Once | ORAL | Status: DC

## 2014-10-06 NOTE — Op Note (Signed)
PATIENT NAME:  Claire Strong, Claire Strong MR#:  [id number] DATE OF BIRTH:  February 11, 1961  DATE OF PROCEDURE:  05/27/2013  PREOPERATIVE DIAGNOSES: 1.  Left subtalar joint arthritis.  2.  Calcaneonavicular tarsal coalition.   POSTOPERATIVE DIAGNOSIS:  1.  Left subtalar joint arthritis.  2.  Calcaneonavicular tarsal coalition.   PROCEDURE:   1. Subtalar joint fusion left foot.  2.  Partial excision of tarsal coalition with bone grafting.   SURGEON: Jaquel Glassburn A. Vickki Muff, DPM.   ANESTHESIA: General with popliteal block.   HEMOSTASIS: Epinephrine infiltrated along the lateral incision site.   COMPLICATIONS: None.   SPECIMEN: None.   OPERATIVE INDICATIONS: This is a 53 year old female who I have seen in the outpatient clinic with a complaint of a painful left foot. She has noted subtalar joint arthritis on CT scan with calcaneonavicular tarsal coalition as well. This was noted to be a partial coalition. The risks, benefits, alternatives, and complications associated with the surgical procedure of subtalar joint fusion with excision of coalition was discussed with the patient and full informed consent has been given.   OPERATIVE PROCEDURE: The patient was brought into the OR and placed on the operating table in the supine position. General intubation was administered by the anesthesia team. The popliteal block was placed just prior to intubation. The left lower extremity was prepped and draped in the usual sterile fashion. Attention was directed to the lateral aspect of the foot where an ollier incision was made overlying the calcaneocuboid joint and ending at the fibular region. Sharp and blunt dissection was carried down to the extensor digitorum brevis muscle belly. This was  reflected distally. The entire dorsal aspect of the calcaneus was then exposed. The subtalar joint was then exposed. Subtalar joint was then removed of all the articular cartilage with a combination of curettage all the way to the middle  facet which was obvious and well visualized. This was then burred with a 4.0 mm egg burr. Multiple small drill holes were made with a 2.0 mm wire passing drill bit down through the subchondral bone plate. Also, fish scaling was performed with an osteotome to the posterior subtalar joint region. Next, the calcaneonavicular coalition was evaluated and seen on fluoroscopy. I was able to debride out the small partial fibrotic area of the coalition and this was then freely mobile. This was down to good bony bleeding. At this point, I injected bone graft into the area to try to get a full fusion of this to assist with the subtalar joint fusion stability. Next, the subtalar joint was then infiltrated with 5ccs bone putty of DBX. The EDB muscle belly was covered over at this time. Next 2 guidewires for the 7.0 mm OrthoHelix screw set were placed from the posterior plantar calcaneus crossing the subtalar joint. A lateral and axial view was noted to have good alignment of both screws and two screws, 85 and 90 mm in length were placed crossing this subtalar joint. Good compression was noted on fluoroscopy. Good alignment was noted grossly. At this time, the residual wound was then closed with a 2-0 and 3-0 Vicryl for the deeper layers and a 3-0 nylon for the skin. The posterior heel wounds were closed with a 3-0 nylon as well. A 0.5% Marcaine block was placed around the saphenous nerve as well as the lateral aspect of the incision site. Total of 20 mL of Marcaine was used. The patient was then placed in a well compressive sterile dressing and placed back in a  posterior placed back in an equalizer walker boot. We will evaluate her in the PACU and plan to discharge her home if her pain level stays well controlled. I will see her in the outpatient clinic in 5 to 7 days. She has already been given a prescription for pain medication.  We will get her a prescription for nausea as well.   ____________________________ Argentina Donovan.  Ether Griffins, DPM jaf:dp D: 05/27/2013 13:01:46 ET T: 05/27/2013 14:32:12 ET JOB#: [id number]  cc: Jill Alexanders A. Ether Griffins, DPM, <Dictator> Adin Lariccia DPM ELECTRONICALLY SIGNED 06/01/2013 13:17

## 2014-10-07 NOTE — Op Note (Signed)
PATIENT NAME:  Claire Strong, Claire Strong MR#:  [id number] DATE OF BIRTH:  1960/10/21  DATE OF PROCEDURE:  05/24/2013  PREOPERATIVE DIAGNOSES: 1.  History of deep vein thrombosis with history of pulmonary embolism.  2. 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  3.  Hypercoagulable state.   POSTOPERATIVE DIAGNOSES:   1.  History of deep vein thrombosis with history of pulmonary embolism.  2. 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  3.  Hypercoagulable state.   PROCEDURES PERFORMED: 1.  Inferior venacavogram.  2.  Placement of an infrarenal inferior vena cava filter, Meridian type.   SURGEON: Renford Dills, M.D.   SEDATION: Versed 2 mg plus fentanyl 50 mcg administered IV. Continuous ECG, pulse oximetry and cardiopulmonary monitoring was performed throughout the entire procedure by the interventional radiology nurse. Total sedation time was 30 minutes.   ACCESS: Right common femoral vein, 9-French sheath.   CONTRAST USED: Isovue 20 mL.   FLUOROSCOPY TIME: Less than 1 minute.   INDICATIONS: Claire Strong is a 54 year old woman with a known hypercoagulable state and history of extensive thrombotic episodes. She also has severe degenerative joint disease and will require surgery of her ankle. This will require that she is off Coumadin. It also will require that she remains immobilized for a fairly long period. Because of these factors, she is undergoing placement of an IVC filter to prevent lethal pulmonary embolism. The risks and benefits were reviewed. All questions answered. The patient agrees to proceed.   DESCRIPTION OF PROCEDURE: The patient is taken to special procedures and placed in the supine position. After adequate sedation is achieved, both groins are prepped and draped in a sterile fashion. Ultrasound is placed in a sterile sleeve. Common femoral vein is identified. It is  echolucent and compressible indicating patency. Image is recorded for the permanent record. Under direct visualization after 1% lidocaine has been infiltrated, Seldinger needle is inserted. J-wire is advanced without difficulty. Delivery sheath with catheter is then advanced and positioned so that the tip is at the confluence of the iliac veins. Bolus injection of contrast is then utilized to image the inferior vena cava. After review of the images, the wire is reintroduced. The sheath is advanced to the L2 level, and a Meridian filter is deployed without difficulty. There are no immediate complications.   INTERPRETATION: Inferior vena cava was opacified with a bolus injection of contrast. There was reflux of contrast into both renal veins marking them at the L1 level. Below this level, the inferior vena cava measures approximately 18 mm in diameter. Meridian filter is deployed in the infrarenal location.   Successful placement of infrarenal retrievable filter, Meridian type, as described above.     ____________________________ Renford Dills, MD ggs:dmm D: 05/24/2013 09:09:01 ET T: 05/24/2013 09:36:18 ET JOB#: [id number]  cc: Renford Dills, MD, <Dictator> Justin A. Ether Griffins, DPM Eustaquio Boyden, MD Renford Dills MD ELECTRONICALLY SIGNED 06/21/2013 19:27

## 2014-10-07 NOTE — Op Note (Signed)
PATIENT NAME:  Claire Strong, Claire Strong MR#:  [id number] DATE OF BIRTH:  30-Aug-1960  DATE OF PROCEDURE:  10/25/2013  PREOPERATIVE DIAGNOSIS: Deep vein thrombosis.   POSTOPERATIVE DIAGNOSIS:  Deep vein thrombosis.  PROCEDURE PERFORMED:  1.  Inferior venacavogram.  2.  Removal of infrarenal inferior vena caval filter.   SURGEON: Renford Dills, M.D.   SEDATION:  IV Versed plus fentanyl. Continuous ECG, pulse oximetry and cardiopulmonary monitoring is performed throughout the entire procedure by the interventional radiology nurse. Total sedation time was 40 minutes.   ACCESS: Right IJ 10 French sheath.   FLUOROSCOPY TIME: 8.0 minutes.   CONTRAST USED:  15 mL.   INDICATIONS:  Ms. [name] is a 54 year old woman who recently had orthopedic surgery and has done well. She has a history of DVT and therefore underwent placement of a filter to prevent lethal pulmonary embolism. She now returns to have the filter removed. Risks and benefits have been reviewed. All questions answered. The patient has agreed to proceed.   DESCRIPTION OF PROCEDURE: The patient is taken to special procedures and placed in the supine position. After adequate sedation is achieved, the right neck is prepped and draped in a sterile fashion. Lidocaine 1% is infiltrated in the soft tissues. Ultrasound is placed in a sterile sleeve. Jugular vein is identified. It is echolucent and compressible indicating patency. Image is recorded for the permanent record. Under real-time visualization, Seldinger needle is used to access the vein. J-wire is then advanced without difficulty.   The J-wire is then negotiated into the inferior vena cava. A small counterincision is made at the wire insertion site and the retrieval sheath and dilator are advanced over the wire. Wire and sheath are advanced down to the iliac vein confluence and a bolus injection of contrast is used to perform imaging of the inferior vena cava. No filling defects are noted. No  thrombus is noted within the filter.   The sheath is then repositioned and the snare was introduced after several attempts. The hook is captured and this filter is collapsed. It is then withdrawn without difficulty and light pressure is held at the base of the neck. Sterile dressing is applied. The patient tolerated the procedure well and there were no immediate complications.   INTERPRETATION: Imaging of the inferior vena cava demonstrates that it is free of filling defects, filter is oriented appropriately and there is no suggestion of thrombus within the filter itself.   SUMMARY: Successful removal of inferior vena cava filter as described above.   ____________________________ Renford Dills, MD ggs:dmm D: 11/02/2013 13:19:41 ET T: 11/02/2013 13:47:22 ET JOB#: [id number]  cc: Renford Dills, MD, <Dictator> Renford Dills MD ELECTRONICALLY SIGNED 11/15/2013 12:26

## 2014-10-07 NOTE — Op Note (Signed)
PATIENT NAME:  Claire Strong, Claire Strong MR#:  [id number] DATE OF BIRTH:  10-17-1960  DATE OF PROCEDURE:  08/25/2013  PREOPERATIVE DIAGNOSIS: Left knee osteoarthritis.   POSTOPERATIVE DIAGNOSIS: Left knee osteoarthritis.   PROCEDURE: Left total knee replacement.   ANESTHESIA: Spinal.   SURGEON: Leitha Schuller, MD  ASSISTANT: Dedra Skeens, PA-C   DESCRIPTION OF PROCEDURE: The patient was brought to the operating room, and after adequate spinal anesthesia was obtained, the patient was placed in a supine position. The left leg was prepped and draped in the usual sterile fashion with a tourniquet applied to the upper thigh. After patient identification and timeout procedures were carried out, the tourniquet was raised to 300 mmHg, and a midline skin incision was made. A medial parapatellar arthrotomy followed this. Inspection of the knee revealed degenerative change to the medial and lateral femoral condyles with exposed bone on the lateral facet of the trochlea and significant bone loss on the patella. The ACL was excised along with the fat pad. The proximal tibia exposure was carried out to allow for application of the Medacta cutting block. The proximal tibia cut was made and the bone resected, and it matched the preop template. Next, the femur was exposed in a similar fashion, with the distal femoral cut carried out, followed by the cutting guide. Anterior, posterior and chamfer cuts made. The residual posterior horns of the menisci were excised at this time along with the PCL. A size 3 tibia trial was placed, with the proximal tibia cut, followed by the keel punch. Femoral trial placed, and with a 12 mm insert, there appeared to be good stability. Distal femoral drill holes were made, followed by the distal notch cut for the trochlear groove. These trials were removed. The patellar cut was then carried out with the patellar cutting guide, and after drill holes were made, the patella sized to a size 3. The  tourniquet was let down at this point, and local anesthetic infiltrated in the periarticular tissues, first Exparel diluted with saline, then morphine, Toradol and Sensorcaine around the joint. Hemostasis was checked with electrocautery. There was no significant bleeding. The knee was thoroughly irrigated and dried after raising the tourniquet again. The tibial component was cemented into place first, with excess cement removed, followed by the femoral component and a 12 mm insert. The knee was held in extension, and the patellar button was clamped into place. After the cement had set and excess cement was removed, the 13 mm trial was placed, and this gave better stability but without being overly tight. The 14 was overly tight, so a 13 mm final insert was chosen. This was popped into place and a set screw placed to hold it in position. The knee was thoroughly irrigated and tourniquet let down. The arthrotomy was repaired using a heavy quill suture, 2-0 quill subcutaneously and skin staples. Xeroform, 4 x 4's, ABD, Webril and Ace wrap were applied along with the Polar Care. The patient was sent to the recovery room in stable condition.  Implants Medact 5 GMK Sphere femur, 4 tibia, 13 mm FLEX insert, 3 patella, all cemented tourniquet time 57 min at 300 mm Hg specimen cut ends of bone complications none    ____________________________ Leitha Schuller, MD mjm:lb D: 08/25/2013 18:42:00 ET T: 08/26/2013 06:16:28 ET JOB#: [id number]  cc: Leitha Schuller, MD, <Dictator> Leitha Schuller MD ELECTRONICALLY SIGNED 08/26/2013 15:37

## 2014-10-07 NOTE — Discharge Summary (Signed)
PATIENT NAME:  Claire Strong, Claire Strong MR#:  [id number] DATE OF BIRTH:  06/17/1960  DATE OF ADMISSION:  08/25/2013 DATE OF DISCHARGE:  08/28/2013  ADMITTING DIAGNOSIS: Left knee osteoarthritis.   DISCHARGE DIAGNOSIS: Left knee osteoarthritis.   PROCEDURE: Left total knee replacement.   SURGEON: Leitha Schuller, M.D.   ASSISTANT: Dedra Skeens, PA-C.   ANESTHESIA: Spinal.  IMPLANTS: Medacta GMK sphere femur #5 with a 4 tibia, 32 mm Flex insert, 3 patella, all cemented.   SPECIMEN: Cut ends of bone.   HISTORY: The patient is a 54 year old female, who has had a CT of the knee on 06/17/2013 that showed the patient having persistent pain that bothers her at night and limits her ability to ambulate. Activities of daily living have been affected. She has failed nonoperative treatment.   PHYSICAL EXAMINATION:  LUNGS: Clear to auscultation.  HEART: Regular rate and rhythm.  HEENT: Normal.  EXTREMITIES: On exam she has 5 to 110 degrees with severe lateral patellofemoral crepitation. She has pain with extension with some valgus deformity that is passively correctable. Distally she is neurovascularly intact. There is swelling to the knee with a palpable Baker's cyst.   HOSPITAL COURSE: The patient was admitted to the hospital on the 12th of March. She had surgery that same day and was brought to the orthopedic floor from the PACU in stable condition. On postoperative day 1, she had slight hyponatremia, but saline was continued and this did improve. Vital signs remained stable. She had good pain control and she progressed very well with physical therapy. On the 15th she had a bowel movement and she was stable and ready for discharge to home with home health PT on the 15th of March.   CONDITION AT DISCHARGE: Stable.   DISCHARGE INSTRUCTIONS: The patient may gradually increase weight-bearing on the affected extremity, elevate the affected foot or leg on 1 or 2 pillows with the foot higher than the knee.  Thigh-high TED hose on both legs and remove at bedtime and replace when arising the next morning. Elevate the heels off the bed. Use incentive spirometer every hour while awake and encourage cough and deep breathing. May resume a regular diet as tolerated. Continue using Polar Care unit maintaining a  temperature between 40 and 50 degrees. She is not to get the dressing bandage wet or dirty. Call [facility] ortho if the dressing gets water under it. Leave the dressing on. Call [facility] ortho for any bright red bleeding from the incision wound, fever above 101.5 degrees, redness, swelling, or drainage at the incision. Call [facility] ortho if you experience any increased leg pain, numbness, weakness in legs or bowel or bladder symptoms. Home physical therapy has been arranged for continuation of rehab program. Please call [facility] if a therapist has not contacted you within 48 hours of your return home.   DISCHARGE MEDICATIONS: Please see discharge instructions for discharge medications.  ____________________________ T. Cranston Neighbor, PA-C tcg:aw D: 09/05/2013 09:05:00 ET T: 09/05/2013 09:20:13 ET JOB#: [id number]  cc: T. Cranston Neighbor, PA-C, <Dictator> Evon Slack [place] ELECTRONICALLY SIGNED 09/23/2013 8:03

## 2014-10-08 NOTE — Op Note (Signed)
PATIENT NAME:  Claire Strong, Claire Strong MR#:  [id number] DATE OF BIRTH:  03/13/1961  DATE OF PROCEDURE:  09/15/2011  PREOPERATIVE DIAGNOSIS:  Large thyroid goiter with multiple nodules creating significant pressure on the airway.   POSTOPERATIVE DIAGNOSIS:  Large thyroid goiter with multiple nodules creating significant pressure on the airway.   OPERATIVE PROCEDURE: Total thyroidectomy.   SURGEON: Vernie Murders, MD   ASSISTANT: Karlyne Greenspan, MD   ANESTHESIA: General.   COMPLICATIONS: None.   TOTAL ESTIMATED BLOOD LOSS: 150 mL.   DESCRIPTION OF PROCEDURE: The patient was given general anesthesia by oral endotracheal intubation. A small shoulder roll was placed. The head was extended slightly. The neck was marked and prepped with alcohol, and then a skin incision was made over a low skin crease approximately two fingerbreadths above the sternal notch. The skin was prepped using 7 mL of 1% Xylocaine with epinephrine 1:100,000 for infiltration of the skin and subcutaneous tissues. The skin was prepped and draped in sterile fashion.   Incision was made through skin and subcutaneous tissue down through the platysmal layer. Bleeding was controlled with electrocautery. Once the platysma was split, dural hooks were used to hold the skin muscle flaps back. The strap muscles were divided in the midline, and the right side was addressed first. She had a very large thyroid gland on both sides with multiple nodules. It was easier to find the inferior border first and this was freed up and using the Harmonic scalpel for coming across most of the vessel.  It was an extremely vascular gland with lots of lateral vessels that were attached that had to be freed up. Once the inferior was freed up some, the lateral attachments were freed as much as possible and then the superior attachments were dilated. This gland went very high into the neck and was difficult to approach. It was freed up, and again the Harmonic scalpel was  used for coming across the superior pole of the thyroid gland and able to pull it down and medialize it some to free it up and rotate the superior portion. Once this was freed up, further lateral attachments were freed up to the gland; and a superior parathyroid gland was found and spared. This had tenuous blood supply. The further lateral attachments were freed up, and as these were the gland was then rotated. There was a small lymph node noted in the lateral soft tissues. The gland was further rotated, and the recurrent laryngeal nerve was found as it entered into the larynx. It was stimulated here, and you could feel the muscles move the larynx. With the recurrent laryngeal nerve in direct vision, the remaining lateral attachments were freed up. Again, it was very vascular and took a long time to get through all of the tiny little blood vessels. The Berry's ligament was cut through then to free it up completely from the anterior tracheal wall.    The left side was then addressed. The strap muscles were elevated again over the top of the thyroid gland, and there were multiple nodules that were palpable here. This time the inferior lobe was much deeper, and some of the attachments around the inferior lobe were freed up and the inferior thyroid vessels were freed as well. The lateral attachments were freed again. It was found to be very vascular, and again many small blood vessels had to be clamped and  some tied. The Harmonic scalpel was used as well as the bipolar. The superior pole was freed up  using the Harmonic scalpel and coming across the superior vessels. With the gland now rotated more medially, there appeared to be further gland deeper down in. There was another whole lobe of the gland further under this. Lateral attachments were freed up with this; and as this was rotated more medially, then the recurrent laryngeal nerve was found underneath this. The superior and inferior parathyroid glands were found  on the left side. These were both spared. All the parathyroid glands were fairly good sized, being approximately 3/8 of an inch in diameter. With the recurrent laryngeal nerve stimulated to verify its position and identify it correctly, the remaining attachments superior to it were freed up. These were small vessels, and then Berry's ligament was freed up as well. The remaining superior attachments were freed up from overlying the larynx. There was a small parameter lobe that was removed. The remaining attachments at the trachea were freed up, and the entire specimen was sent in one piece for permanent section. A tag was placed at the left superior pole.   The wound was copiously irrigated. It oozed a lot. Bleeding was checked for very carefully. A couple of small bleeding vessels were found and these were cauterized. The recurrent laryngeal nerve stimulated well bilaterally. Surgicel was then placed in the thyroid bed on both sides. There were no lumps or abnormalities palpable in the bed on either side. Then 10-French TLS drains were placed bilaterally crisscrossing the midline and coming out through separate inferior stab incisions. The strap muscles were then closed using 4-0 Vicryl, and then the platysmal layer was closed with 4-0 Vicryl as well. The dermis was closed with 4-0 Vicryl, and then the skin edges were held in apposition with a 6-0 nylon running locking suture. The wound was covered with Neosporin, followed by Telfa and Tegaderm. The drains were placed to low continuous bulb suction. The patient tolerated the procedure well. She was awakened and taken to the recovery room in satisfactory condition. There were no operative complications.    ____________________________ Cammy Copa, MD phj:cbb D: 09/15/2011 22:42:15 ET T: 09/16/2011 11:09:40 ET JOB#: [id number]  cc: Cammy Copa, MD, <Dictator> Cammy Copa MD ELECTRONICALLY SIGNED 09/17/2011 12:41

## 2014-10-11 ENCOUNTER — Telehealth: Payer: Self-pay | Admitting: Family Medicine

## 2014-10-11 DIAGNOSIS — D6862 Lupus anticoagulant syndrome: Secondary | ICD-10-CM

## 2014-10-11 DIAGNOSIS — M1990 Unspecified osteoarthritis, unspecified site: Secondary | ICD-10-CM | POA: Insufficient documentation

## 2014-10-11 NOTE — Telephone Encounter (Signed)
Referral placed.

## 2014-10-11 NOTE — Telephone Encounter (Signed)
Pt states she needs a new referral to Dr. Saverio Danker (rheumatologist).  She states the rheumatologist we sent her to before did not look at any other type of arthritis other than rheumatoid arthritis.   Pt's number:  [phone]

## 2014-10-12 NOTE — Addendum Note (Signed)
Addended by: Eustaquio Boyden on: 10/12/2014 02:24 PM   Modules accepted: Orders

## 2014-10-15 NOTE — Op Note (Signed)
PATIENT NAME:  Claire Strong, Claire Strong MR#:  [id number] DATE OF BIRTH:  [birth year]  DATE OF PROCEDURE:  07/05/2014  PREOPERATIVE DIAGNOSIS:  Right kidney stone, horseshoe kidney.   POSTOPERATIVE DIAGNOSIS:  Right kidney stone, horseshoe kidney.   PROCEDURE PERFORMED: Right ureteroscopy, laser lithotripsy, right ureteral stent.   ANESTHESIA: General anesthesia.   ATTENDING SURGEON: Sherlynn Stalls, MD.   ESTIMATED BLOOD LOSS: Minimal.   DRAINS: A 6 x 22 French double-J ureteral stent on right.   SPECIMENS: Stone fragment.   COMPLICATIONS: None.   INDICATION:  This is a 54 year old female with a history of recurrent kidney stones and a horseshoe kidney with right flank pain, found to have an 8 mm right kidney stone. Although the stone is not obstructing she does feel that her pain is related to the stone. She was counseled on her various treatment options and would like to undergo right ureteroscopy, laser lithotripsy, right ureteral stent placement for treatment of the stone. Risks and benefits of the procedure were explained in detail. The patient agreed to proceed as planned.   PROCEDURE: The patient was correctly identified in the preoperative holding area and informed consent was confirmed. She was brought to the operating suite and placed on the table in the supine position. At this time universal timeout protocol was performed. All team members were identified. Venodyne boots were placed and she was administered 500 mg of IV Levaquin in the perioperative period. She was then placed under general anesthesia, repositioned lower on the bed in the dorsal lithotomy position and prepped and draped in standard surgical fashion. A rigid 70 French cystoscope was then advanced per urethra into the bladder. Attention was turned to the right ureteral orifice which was cannulated using a 5 [place] open-ended ureteral catheter. A retrograde pyelogram was performed revealing a delicate-appearing ureter with  abnormal axis of the right kidney such that the calyces pointed medial and inferiorly consistent with her known anatomy of a horseshoe kidney. The kidney was also somewhat lower than would otherwise be expected again due to the horseshoe kidney. There were no obvious filling defects noted. A Sensor wire was then placed up to the level of the renal pelvis without difficulty and a dual-lumen access sheath was used to introduce a second wire up to the same level without any difficulty. A 7 French Storz flexible ureteroscope was then advanced over one of the wires up to the level of the renal pelvis. The second wire was snapped in place as a safety wire. The renal pelvis was then directly visualized and an 8 mm stone was encountered in a slightly posterior medial mid calyx. A 373 micron laser fiber was then brought in and using the settings of 0.2 joules and 50 Hz the stone was dusted into very small fragments. Once the fragments were quite small it was apparent that the larger of these would need to be basketed out.  Second wire was then replaced through the scope and the scope was removed. An access sheath was advanced over the working wire, excluding the safety wire up to the level of the proximal ureter without difficulty and the inner cannula was removed. The scope was then reintroduced back up into the renal pelvis through the access sheath and the fragments were serially basketed. The stone fragments were sent off for stone analysis. Eventually the kidney was deemed clear of any significant stone fragments. A retrograde pyelogram was then performed through the scope and this was used as the map to  insure that all of the calyces had been directly visualized and each of these calyces were free of any significant stone fragments. Once this was deemed satisfactory the access sheath was scoped out under direct visualization to insure that there were no injuries to the ureter or ureteral fragments, which was confirmed.  A 6 x 22 French double-J ureteral stent was then placed fluoroscopically over the safety wire up to the level of the renal pelvis. The wire was partially withdrawn and a coil was noted within the renal pelvis. The wire was then fully withdrawn and a coil was noted within the bladder. The scope was reintroduced into the bladder to insure good position within the bladder. The bladder was then drained. The patient was then repositioned in the supine position, reversed from anesthesia and taken to the PACU in stable condition. There were no complications in this case.     ____________________________ Claris Gladden, MD ajb:bu D: 07/05/2014 15:09:51 ET T: 07/05/2014 17:00:46 ET JOB#: [id number]  cc: Claris Gladden, MD, <Dictator> Claris Gladden MD ELECTRONICALLY SIGNED 07/19/2014 14:25

## 2014-10-31 ENCOUNTER — Other Ambulatory Visit: Payer: Self-pay | Admitting: Family Medicine

## 2014-10-31 NOTE — Telephone Encounter (Signed)
Ok to refill 

## 2014-10-31 NOTE — Telephone Encounter (Signed)
Rx called to pharmacy as instructed. 

## 2014-10-31 NOTE — Telephone Encounter (Signed)
plz phone in. 

## 2014-11-13 ENCOUNTER — Other Ambulatory Visit: Payer: Self-pay | Admitting: Family Medicine

## 2015-01-16 ENCOUNTER — Other Ambulatory Visit: Payer: Self-pay | Admitting: Family Medicine

## 2015-01-16 NOTE — Telephone Encounter (Signed)
Ok to refill 

## 2015-01-17 NOTE — Telephone Encounter (Signed)
Rx called in as directed.   

## 2015-01-17 NOTE — Telephone Encounter (Signed)
plz phone in. 

## 2015-01-24 ENCOUNTER — Other Ambulatory Visit: Payer: Self-pay

## 2015-01-24 DIAGNOSIS — N200 Calculus of kidney: Secondary | ICD-10-CM

## 2015-02-05 ENCOUNTER — Encounter: Payer: Self-pay | Admitting: Cardiovascular Disease

## 2015-02-05 ENCOUNTER — Ambulatory Visit (INDEPENDENT_AMBULATORY_CARE_PROVIDER_SITE_OTHER): Payer: BLUE CROSS/BLUE SHIELD | Admitting: Cardiovascular Disease

## 2015-02-05 VITALS — BP 110/72 | HR 50 | Ht 69.0 in | Wt 221.5 lb

## 2015-02-05 DIAGNOSIS — I471 Supraventricular tachycardia: Secondary | ICD-10-CM

## 2015-02-05 DIAGNOSIS — R609 Edema, unspecified: Secondary | ICD-10-CM

## 2015-02-05 DIAGNOSIS — I10 Essential (primary) hypertension: Secondary | ICD-10-CM

## 2015-02-05 NOTE — Assessment & Plan Note (Signed)
If blood pressure runs low, would decrease the dose of her benazepril. She can try this at home with close monitoring of her blood pressure

## 2015-02-05 NOTE — Assessment & Plan Note (Signed)
She currently takes Lasix with stable renal function Suspect much of her leg edema is from venous insufficiency

## 2015-02-05 NOTE — Assessment & Plan Note (Signed)
Recommended she stay on metoprolol 25 mg twice a day She reports no symptoms on this beta blocker regimen

## 2015-02-05 NOTE — Progress Notes (Signed)
Patient ID: Claire Strong, female    DOB: March 29, 1961, 54 y.o.   MRN: [id number]  HPI Comments: 54 year old woman with h/o lupus anticoagulant with remote DVT, HTN, horseshoe kidney, asthma and multinodular goiter s/p thyroidectomy,  s/p roux en y gastric bypass,  post surgical hypertensive/palpitation episode associated with severe HA seen at [facility], who presents for routine evaluation of her palpitations.she has a long history of palpitations, atrial tachycardia. prior Holter monitor showed atrial tachycardia.  In follow-up today, she reports that she has chronic back discomfort. Currently not working She try to decrease her metoprolol dose but had breakthrough tachycardia. Now taking 25 mg twice a day She is currently remodeling her house with her husband. She is doing some of the work with him Blood pressure running in a reasonable range, heart rate low on a chronic basis though asymptomatic  EKG on today's visit shows sinus bradycardia with rate 50 bpm, no significant ST or T-wave changes  She takes Lasix for leg edema History of weight loss after GI surgery  Prior total knee replacement on the left  Other past medical history  History of varicose veins.   History of brain surgery on the left.   Father with CAD and CABG at 30 yo, was smoker.   Allergies  Allergen Reactions  . Sulfa Antibiotics Shortness Of Breath  . Nutritional Supplements Swelling    Unsure which    Current Outpatient Prescriptions on File Prior to Visit  Medication Sig Dispense Refill  . ACETAMINOPHEN-BUTALBITAL 50-325 MG TABS Take 1 tablet by mouth every 4 (four) hours.    Marland Kitchen albuterol (PROVENTIL HFA;VENTOLIN HFA) 108 (90 BASE) MCG/ACT inhaler Inhale 2 puffs into the lungs every 6 (six) hours as needed. 1 Inhaler 11  . aspirin 81 MG tablet Take 81 mg by mouth daily.    . B Complex-C (SUPER B COMPLEX PO) Take 1 capsule by mouth daily.      . benazepril (LOTENSIN) 10 MG tablet TAKE ONE TABLET BY MOUTH  ONCE DAILY 90 tablet 3  . buPROPion (WELLBUTRIN) 75 MG tablet TAKE ONE TABLET BY MOUTH TWICE DAILY 60 tablet 3  . carisoprodol (SOMA) 350 MG tablet TAKE ONE TABLET BY MOUTH AT BEDTIME 60 tablet 0  . cetirizine (ZYRTEC) 10 MG tablet Take 10 mg by mouth daily.    . Cholecalciferol (VITAMIN D3) 1000 UNITS tablet Take 2,000 Units by mouth daily.     . citalopram (CELEXA) 40 MG tablet TAKE ONE TABLET BY MOUTH ONCE DAILY 90 tablet 3  . diclofenac sodium (VOLTAREN) 1 % GEL Apply 1 application topically 3 (three) times daily. (Patient taking differently: Apply 1 application topically 3 (three) times daily as needed. ) 1 Tube 1  . EPINEPHrine (EPI-PEN) 0.3 mg/0.3 mL DEVI Inject 0.3 mLs (0.3 mg total) into the muscle once. 1 Device 2  . fluconazole (DIFLUCAN) 150 MG tablet Take 1 tablet (150 mg total) by mouth once. 1 tablet 0  . Fluticasone-Salmeterol (ADVAIR DISKUS) 100-50 MCG/DOSE AEPB Inhale 1 puff into the lungs every 12 (twelve) hours. (Patient taking differently: Inhale 1 puff into the lungs 2 (two) times daily as needed. ) 60 each 3  . furosemide (LASIX) 20 MG tablet TAKE ONE TABLET BY MOUTH ONCE DAILY 90 tablet 3  . levothyroxine (SYNTHROID, LEVOTHROID) 150 MCG tablet Take 150 mcg by mouth daily before breakfast.    . Magnesium 250 MG TABS Take 1 tablet by mouth daily.    . metoprolol tartrate (LOPRESSOR) 25 MG  tablet Take 25 mg by mouth 2 (two) times daily.     . montelukast (SINGULAIR) 10 MG tablet Take 10 mg by mouth at bedtime.      . Multiple Vitamin (MULTIVITAMIN) tablet Take 1 tablet by mouth daily.      . ondansetron (ZOFRAN) 4 MG tablet Take 4 mg by mouth every 8 (eight) hours as needed.     . Potassium Gluconate 595 MG CAPS Take 1 capsule by mouth daily.    . traMADol (ULTRAM) 50 MG tablet Take 1 tablet (50 mg total) by mouth 2 (two) times daily as needed for pain. 40 tablet 0  . vitamin B-12 (CYANOCOBALAMIN) 1000 MCG tablet Take 1,000 mcg by mouth daily.     No current  facility-administered medications on file prior to visit.    Past Medical History  Diagnosis Date  . Lupus anticoagulant disorder   . Osteoarthritis     ?FM by rheum  . Depression   . HTN (hypertension)   . Kidney stones     s/p renal hematoma after lithotripsy on right  . Horseshoe kidney     sole, R kidney damage  . 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 Jacqulyn Ducking  . History of gastric ulcer 2009  . History of DVT (deep vein thrombosis)     DVTs after 1st pregnancy, not on AC 2/2 bleeding ulcer, greenfield filter in place  . Nasal septal perforation     chronic, ENT rec avoid antihistamine, INS  . Seasonal allergies     allergy shots, singulair  . Multinodular goiter     s/p thyroidectomy  . Bronchial asthma   . Post-surgical hypothyroidism     for multinodular goiter  . Diverticulosis     mild by colonoscopy  . 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 Trula Ore ortho)  . Chronic venous insufficiency 2007    s/p vein stripping and laser ablation  . Fibromyalgia 02/2014    [place]    Past Surgical History  Procedure Laterality Date  . Gastric bypass  05/21/05    Dr. Jacqulyn Ducking, Roux-en-Y  . Partial hysterectomy  1989    ovaries remain  . Back surgery  2009  . Cholecystectomy  1998  . Appendectomy  1978  . Tonsillectomy  1969  . Nasal septum surgery  1981    deviated septum, repaired at [facility] [address]  . Total thyroidectomy  09/2011    benign path (done for multinodular goiter concern for cancer)  . Colonoscopy  05/2012    hyperplastic polyps x2, mild diverticulosis Arlyce Dice) rpt 10 yrs  . Skin surgery      basel cell  . Cyst excision      back  and shoulder  . Total knee arthroplasty Left 08/2013  . Ankle fusion Left 05/2013  . Greenfield filter removal  2015    removed 6 wks after surgery    Social History  reports that she quit smoking about 10 years ago. She has never used smokeless tobacco. She reports that she does not drink alcohol or  use illicit drugs.  Family History family history includes Alcohol abuse in her father; Alzheimer's disease in her mother; Cancer in her father and maternal uncle; Coronary artery disease (age of onset: 31) in her father; Diabetes in her father, mother, and paternal grandmother; Hyperlipidemia in her mother. There is no history of Stroke, Colon cancer, or Stomach cancer.   Review of Systems  Constitutional: Negative.  Respiratory: Negative.   Cardiovascular: Negative.   Gastrointestinal: Negative.   Musculoskeletal: Negative.   Neurological: Negative.   Hematological: Negative.   Psychiatric/Behavioral: Negative.   All other systems reviewed and are negative.   BP 110/72 mmHg  Pulse 50  Ht 5\' 9"  (1.753 m)  Wt 221 lb 8 oz (100.472 kg)  BMI 32.69 kg/m2  Physical Exam  Constitutional: She is oriented to person, place, and time. She appears well-developed and well-nourished.  Obese  HENT:  Head: Normocephalic.  Nose: Nose normal.  Mouth/Throat: Oropharynx is clear and moist.  Eyes: Conjunctivae are normal. Pupils are equal, round, and reactive to light.  Neck: Normal range of motion. Neck supple. No JVD present.  Well healed surgical incision site from thyroidectomy  Cardiovascular: Normal rate, regular rhythm, S1 normal, S2 normal, normal heart sounds and intact distal pulses.  Exam reveals no gallop and no friction rub.   No murmur heard. Pulmonary/Chest: Effort normal and breath sounds normal. No respiratory distress. She has no wheezes. She has no rales. She exhibits no tenderness.  Abdominal: Soft. Bowel sounds are normal. She exhibits no distension. There is no tenderness.  Musculoskeletal: Normal range of motion. She exhibits no edema or tenderness.  Lymphadenopathy:    She has no cervical adenopathy.  Neurological: She is alert and oriented to person, place, and time. Coordination normal.  Skin: Skin is warm and dry. No rash noted. No erythema.  Psychiatric: She has a  normal mood and affect. Her behavior is normal. Judgment and thought content normal.    Assessment and Plan  Nursing note and vitals reviewed.

## 2015-02-05 NOTE — Patient Instructions (Signed)
You are doing well. No medication changes were made.  OK to wean benazepril  Please call us if you have new issues that need to be addressed before your next appt.  Your physician wants you to follow-up in: 12 months.  You will receive a reminder letter in the mail two months in advance. If you don't receive a letter, please call our office to schedule the follow-up appointment.

## 2015-02-09 ENCOUNTER — Encounter: Payer: Self-pay | Admitting: Family Medicine

## 2015-02-10 ENCOUNTER — Other Ambulatory Visit: Payer: Self-pay | Admitting: Family Medicine

## 2015-02-10 DIAGNOSIS — Z9884 Bariatric surgery status: Secondary | ICD-10-CM

## 2015-02-10 DIAGNOSIS — D6862 Lupus anticoagulant syndrome: Secondary | ICD-10-CM

## 2015-02-10 DIAGNOSIS — E890 Postprocedural hypothyroidism: Secondary | ICD-10-CM

## 2015-02-10 DIAGNOSIS — I10 Essential (primary) hypertension: Secondary | ICD-10-CM

## 2015-02-13 ENCOUNTER — Other Ambulatory Visit (INDEPENDENT_AMBULATORY_CARE_PROVIDER_SITE_OTHER): Payer: BLUE CROSS/BLUE SHIELD

## 2015-02-13 DIAGNOSIS — D6862 Lupus anticoagulant syndrome: Secondary | ICD-10-CM

## 2015-02-13 DIAGNOSIS — E890 Postprocedural hypothyroidism: Secondary | ICD-10-CM

## 2015-02-13 DIAGNOSIS — Z9884 Bariatric surgery status: Secondary | ICD-10-CM

## 2015-02-13 DIAGNOSIS — I10 Essential (primary) hypertension: Secondary | ICD-10-CM

## 2015-02-13 LAB — CBC WITH DIFFERENTIAL/PLATELET
BASOS ABS: 0 10*3/uL (ref 0.0–0.1)
Basophils Relative: 0.5 % (ref 0.0–3.0)
EOS ABS: 0.1 10*3/uL (ref 0.0–0.7)
Eosinophils Relative: 2.3 % (ref 0.0–5.0)
HCT: 39.6 % (ref 36.0–46.0)
Hemoglobin: 13.3 g/dL (ref 12.0–15.0)
LYMPHS ABS: 1.3 10*3/uL (ref 0.7–4.0)
Lymphocytes Relative: 36.2 % (ref 12.0–46.0)
MCHC: 33.4 g/dL (ref 30.0–36.0)
MCV: 87.9 fl (ref 78.0–100.0)
MONO ABS: 0.2 10*3/uL (ref 0.1–1.0)
MONOS PCT: 5.2 % (ref 3.0–12.0)
NEUTROS ABS: 2 10*3/uL (ref 1.4–7.7)
NEUTROS PCT: 55.8 % (ref 43.0–77.0)
PLATELETS: 165 10*3/uL (ref 150.0–400.0)
RBC: 4.51 Mil/uL (ref 3.87–5.11)
RDW: 12.5 % (ref 11.5–15.5)
WBC: 3.7 10*3/uL — ABNORMAL LOW (ref 4.0–10.5)

## 2015-02-13 LAB — IBC PANEL
IRON: 84 ug/dL (ref 42–145)
Saturation Ratios: 25.2 % (ref 20.0–50.0)
TRANSFERRIN: 238 mg/dL (ref 212.0–360.0)

## 2015-02-13 LAB — LIPID PANEL
CHOLESTEROL: 156 mg/dL (ref 0–200)
HDL: 41.6 mg/dL (ref >39.00)
LDL Cholesterol: 98 mg/dL (ref 0–99)
NonHDL: 114.38
TRIGLYCERIDES: 83 mg/dL (ref 0.0–149.0)
Total CHOL/HDL Ratio: 4
VLDL: 16.6 mg/dL (ref 0.0–40.0)

## 2015-02-13 LAB — COMPREHENSIVE METABOLIC PANEL
ALBUMIN: 3.7 g/dL (ref 3.5–5.2)
ALK PHOS: 127 U/L — AB (ref 39–117)
ALT: 13 U/L (ref 0–35)
AST: 18 U/L (ref 0–37)
BILIRUBIN TOTAL: 0.6 mg/dL (ref 0.2–1.2)
BUN: 18 mg/dL (ref 6–23)
CO2: 28 mEq/L (ref 19–32)
Calcium: 8.5 mg/dL (ref 8.4–10.5)
Chloride: 107 mEq/L (ref 96–112)
Creatinine, Ser: 1 mg/dL (ref 0.40–1.20)
GFR: 61.52 mL/min (ref >60.00)
GLUCOSE: 85 mg/dL (ref 70–99)
POTASSIUM: 4.3 meq/L (ref 3.5–5.1)
SODIUM: 141 meq/L (ref 135–145)
TOTAL PROTEIN: 6.1 g/dL (ref 6.0–8.3)

## 2015-02-13 LAB — VITAMIN B12: VITAMIN B 12: 1205 pg/mL — AB (ref 211–911)

## 2015-02-13 LAB — FERRITIN: FERRITIN: 22 ng/mL (ref 10.0–291.0)

## 2015-02-13 LAB — VITAMIN D 25 HYDROXY (VIT D DEFICIENCY, FRACTURES): VITD: 42.45 ng/mL (ref 30.00–100.00)

## 2015-02-13 LAB — FOLATE: Folate: 8.3 ng/mL (ref >5.9)

## 2015-02-13 LAB — TSH: TSH: 0.45 u[IU]/mL (ref 0.35–4.50)

## 2015-02-16 ENCOUNTER — Encounter: Payer: BC Managed Care – PPO | Admitting: Family Medicine

## 2015-02-18 LAB — VITAMIN B1: VITAMIN B1 (THIAMINE): 13 nmol/L (ref 8–30)

## 2015-03-01 LAB — HM MAMMOGRAPHY: HM MAMMO: NORMAL

## 2015-03-02 ENCOUNTER — Ambulatory Visit (INDEPENDENT_AMBULATORY_CARE_PROVIDER_SITE_OTHER): Payer: BLUE CROSS/BLUE SHIELD | Admitting: Family Medicine

## 2015-03-02 ENCOUNTER — Encounter: Payer: Self-pay | Admitting: Family Medicine

## 2015-03-02 VITALS — BP 108/68 | HR 68 | Temp 98.8°F | Ht 68.88 in | Wt 224.5 lb

## 2015-03-02 DIAGNOSIS — E890 Postprocedural hypothyroidism: Secondary | ICD-10-CM

## 2015-03-02 DIAGNOSIS — Z0000 Encounter for general adult medical examination without abnormal findings: Secondary | ICD-10-CM | POA: Diagnosis not present

## 2015-03-02 DIAGNOSIS — F329 Major depressive disorder, single episode, unspecified: Secondary | ICD-10-CM | POA: Diagnosis not present

## 2015-03-02 DIAGNOSIS — F419 Anxiety disorder, unspecified: Secondary | ICD-10-CM | POA: Diagnosis not present

## 2015-03-02 DIAGNOSIS — Q631 Lobulated, fused and horseshoe kidney: Secondary | ICD-10-CM

## 2015-03-02 DIAGNOSIS — F32A Depression, unspecified: Secondary | ICD-10-CM

## 2015-03-02 DIAGNOSIS — J4520 Mild intermittent asthma, uncomplicated: Secondary | ICD-10-CM

## 2015-03-02 DIAGNOSIS — I10 Essential (primary) hypertension: Secondary | ICD-10-CM

## 2015-03-02 DIAGNOSIS — D6862 Lupus anticoagulant syndrome: Secondary | ICD-10-CM

## 2015-03-02 DIAGNOSIS — Z9884 Bariatric surgery status: Secondary | ICD-10-CM

## 2015-03-02 MED ORDER — TRAZODONE HCL 50 MG PO TABS: 25.0000 mg | ORAL_TABLET | Freq: Every evening | ORAL | Status: DC | PRN

## 2015-03-02 NOTE — Assessment & Plan Note (Signed)
Preventative protocols reviewed and updated unless pt declined. Discussed healthy diet and lifestyle.  

## 2015-03-02 NOTE — Patient Instructions (Addendum)
Start trazodone nightly for sleep 1/2 - 1 tablet. Flu shot at pharmacy - let us know when you receive to update your chart. Return in 1 year for next physical - we will check HIV and Hep C at that time. You are doing well today, call us with quesitons.  Health Maintenance Adopting a healthy lifestyle and getting preventive care can go a long way to promote health and wellness. Talk with your health care provider about what schedule of regular examinations is right for you. This is a good chance for you to check in with your provider about disease prevention and staying healthy. In between checkups, there are plenty of things you can do on your own. Experts have done a lot of research about which lifestyle changes and preventive measures are most likely to keep you healthy. Ask your health care provider for more information. WEIGHT AND DIET  Eat a healthy diet  Be sure to include plenty of vegetables, fruits, low-fat dairy products, and lean protein.  Do not eat a lot of foods high in solid fats, added sugars, or salt.  Get regular exercise. This is one of the most important things you can do for your health.  Most adults should exercise for at least 150 minutes each week. The exercise should increase your heart rate and make you sweat (moderate-intensity exercise).  Most adults should also do strengthening exercises at least twice a week. This is in addition to the moderate-intensity exercise.  Maintain a healthy weight  Body mass index (BMI) is a measurement that can be used to identify possible weight problems. It estimates body fat based on height and weight. Your health care provider can help determine your BMI and help you achieve or maintain a healthy weight.  For females 7 years of age and older:   A BMI below 18.5 is considered underweight.  A BMI of 18.5 to 24.9 is normal.  A BMI of 25 to 29.9 is considered overweight.  A BMI of 30 and above is considered obese.  Watch  levels of cholesterol and blood lipids  You should start having your blood tested for lipids and cholesterol at 53 years of age, then have this test every 5 years.  You may need to have your cholesterol levels checked more often if:  Your lipid or cholesterol levels are high.  You are older than 53 years of age.  You are at high risk for heart disease.  CANCER SCREENING   Lung Cancer  Lung cancer screening is recommended for adults 19-87 years old who are at high risk for lung cancer because of a history of smoking.  A yearly low-dose CT scan of the lungs is recommended for people who:  Currently smoke.  Have quit within the past 15 years.  Have at least a 30-pack-year history of smoking. A pack year is smoking an average of one pack of cigarettes a day for 1 year.  Yearly screening should continue until it has been 15 years since you quit.  Yearly screening should stop if you develop a health problem that would prevent you from having lung cancer treatment.  Breast Cancer  Practice breast self-awareness. This means understanding how your breasts normally appear and feel.  It also means doing regular breast self-exams. Let your health care provider know about any changes, no matter how small.  If you are in your 20s or 30s, you should have a clinical breast exam (CBE) by a health care provider every 1-3  years as part of a regular health exam.  If you are 1 or older, have a CBE every year. Also consider having a breast X-ray (mammogram) every year.  If you have a family history of breast cancer, talk to your health care provider about genetic screening.  If you are at high risk for breast cancer, talk to your health care provider about having an MRI and a mammogram every year.  Breast cancer gene (BRCA) assessment is recommended for women who have family members with BRCA-related cancers. BRCA-related cancers include:  Breast.  Ovarian.  Tubal.  Peritoneal  cancers.  Results of the assessment will determine the need for genetic counseling and BRCA1 and BRCA2 testing. Cervical Cancer Routine pelvic examinations to screen for cervical cancer are no longer recommended for nonpregnant women who are considered low risk for cancer of the pelvic organs (ovaries, uterus, and vagina) and who do not have symptoms. A pelvic examination may be necessary if you have symptoms including those associated with pelvic infections. Ask your health care provider if a screening pelvic exam is right for you.   The Pap test is the screening test for cervical cancer for women who are considered at risk.  If you had a hysterectomy for a problem that was not cancer or a condition that could lead to cancer, then you no longer need Pap tests.  If you are older than 65 years, and you have had normal Pap tests for the past 10 years, you no longer need to have Pap tests.  If you have had past treatment for cervical cancer or a condition that could lead to cancer, you need Pap tests and screening for cancer for at least 20 years after your treatment.  If you no longer get a Pap test, assess your risk factors if they change (such as having a new sexual partner). This can affect whether you should start being screened again.  Some women have medical problems that increase their chance of getting cervical cancer. If this is the case for you, your health care provider may recommend more frequent screening and Pap tests.  The human papillomavirus (HPV) test is another test that may be used for cervical cancer screening. The HPV test looks for the virus that can cause cell changes in the cervix. The cells collected during the Pap test can be tested for HPV.  The HPV test can be used to screen women 14 years of age and older. Getting tested for HPV can extend the interval between normal Pap tests from three to five years.  An HPV test also should be used to screen women of any age who  have unclear Pap test results.  After 53 years of age, women should have HPV testing as often as Pap tests.  Colorectal Cancer  This type of cancer can be detected and often prevented.  Routine colorectal cancer screening usually begins at 53 years of age and continues through 53 years of age.  Your health care provider may recommend screening at an earlier age if you have risk factors for colon cancer.  Your health care provider may also recommend using home test kits to check for hidden blood in the stool.  A small camera at the end of a tube can be used to examine your colon directly (sigmoidoscopy or colonoscopy). This is done to check for the earliest forms of colorectal cancer.  Routine screening usually begins at age 56.  Direct examination of the colon should be repeated  every 5-10 years through 53 years of age. However, you may need to be screened more often if early forms of precancerous polyps or small growths are found. Skin Cancer  Check your skin from head to toe regularly.  Tell your health care provider about any new moles or changes in moles, especially if there is a change in a mole's shape or color.  Also tell your health care provider if you have a mole that is larger than the size of a pencil eraser.  Always use sunscreen. Apply sunscreen liberally and repeatedly throughout the day.  Protect yourself by wearing long sleeves, pants, a wide-brimmed hat, and sunglasses whenever you are outside. HEART DISEASE, DIABETES, AND HIGH BLOOD PRESSURE   Have your blood pressure checked at least every 1-2 years. High blood pressure causes heart disease and increases the risk of stroke.  If you are between 90 years and 33 years old, ask your health care provider if you should take aspirin to prevent strokes.  Have regular diabetes screenings. This involves taking a blood sample to check your fasting blood sugar level.  If you are at a normal weight and have a low risk for  diabetes, have this test once every three years after 53 years of age.  If you are overweight and have a high risk for diabetes, consider being tested at a younger age or more often. PREVENTING INFECTION  Hepatitis B  If you have a higher risk for hepatitis B, you should be screened for this virus. You are considered at high risk for hepatitis B if:  You were born in a country where hepatitis B is common. Ask your health care provider which countries are considered high risk.  Your parents were born in a high-risk country, and you have not been immunized against hepatitis B (hepatitis B vaccine).  You have HIV or AIDS.  You use needles to inject street drugs.  You live with someone who has hepatitis B.  You have had sex with someone who has hepatitis B.  You get hemodialysis treatment.  You take certain medicines for conditions, including cancer, organ transplantation, and autoimmune conditions. Hepatitis C  Blood testing is recommended for:  Everyone born from [birth year] through 1964.  Anyone with known risk factors for hepatitis C. Sexually transmitted infections (STIs)  You should be screened for sexually transmitted infections (STIs) including gonorrhea and chlamydia if:  You are sexually active and are younger than 53 years of age.  You are older than 53 years of age and your health care provider tells you that you are at risk for this type of infection.  Your sexual activity has changed since you were last screened and you are at an increased risk for chlamydia or gonorrhea. Ask your health care provider if you are at risk.  If you do not have HIV, but are at risk, it may be recommended that you take a prescription medicine daily to prevent HIV infection. This is called pre-exposure prophylaxis (PrEP). You are considered at risk if:  You are sexually active and do not regularly use condoms or know the HIV status of your partner(s).  You take drugs by injection.  You are  sexually active with a partner who has HIV. Talk with your health care provider about whether you are at high risk of being infected with HIV. If you choose to begin PrEP, you should first be tested for HIV. You should then be tested every 3 months for as long as  you are taking PrEP.  PREGNANCY   If you are premenopausal and you may become pregnant, ask your health care provider about preconception counseling.  If you may become pregnant, take 400 to 800 micrograms (mcg) of folic acid every day.  If you want to prevent pregnancy, talk to your health care provider about birth control (contraception). OSTEOPOROSIS AND MENOPAUSE   Osteoporosis is a disease in which the bones lose minerals and strength with aging. This can result in serious bone fractures. Your risk for osteoporosis can be identified using a bone density scan.  If you are 67 years of age or older, or if you are at risk for osteoporosis and fractures, ask your health care provider if you should be screened.  Ask your health care provider whether you should take a calcium or vitamin D supplement to lower your risk for osteoporosis.  Menopause may have certain physical symptoms and risks.  Hormone replacement therapy may reduce some of these symptoms and risks. Talk to your health care provider about whether hormone replacement therapy is right for you.  HOME CARE INSTRUCTIONS   Schedule regular health, dental, and eye exams.  Stay current with your immunizations.   Do not use any tobacco products including cigarettes, chewing tobacco, or electronic cigarettes.  If you are pregnant, do not drink alcohol.  If you are breastfeeding, limit how much and how often you drink alcohol.  Limit alcohol intake to no more than 1 drink per day for nonpregnant women. One drink equals 12 ounces of beer, 5 ounces of wine, or 1 ounces of hard liquor.  Do not use street drugs.  Do not share needles.  Ask your health care provider for  help if you need support or information about quitting drugs.  Tell your health care provider if you often feel depressed.  Tell your health care provider if you have ever been abused or do not feel safe at home. Document Released: 12/16/2010 Document Revised: 10/17/2013 Document Reviewed: 05/04/2013 [facility] Patient Information 2015 [place], [place]. This information is not intended to replace advice given to you by your health care provider. Make sure you discuss any questions you have with your health care provider.

## 2015-03-02 NOTE — Assessment & Plan Note (Signed)
On prn advair. Cannot afford singulair.

## 2015-03-02 NOTE — Assessment & Plan Note (Signed)
Continue to monitor kidney function closely.

## 2015-03-02 NOTE — Assessment & Plan Note (Signed)
Chronic, stable. TSH normal.

## 2015-03-02 NOTE — Assessment & Plan Note (Addendum)
Reviewed labwork with patient. For low iron stores - rec iron rich diet.as pt has difficulty tolerating oral iron supplementation.

## 2015-03-02 NOTE — Progress Notes (Signed)
Pre visit review using our clinic review tool, if applicable. No additional management support is needed unless otherwise documented below in the visit note. 

## 2015-03-02 NOTE — Assessment & Plan Note (Signed)
Chronic, stable. Continue current regimen. Pt did not tolerate trying to come off benazepril. Consider trial in future again if needed.

## 2015-03-02 NOTE — Assessment & Plan Note (Signed)
Chronic, deteriorated (mostly anxiety). Continue celexa 40mg  daily. Add trazodone nightly.

## 2015-03-02 NOTE — Assessment & Plan Note (Signed)
Continue aspirin daily. No recent blood clots.

## 2015-03-02 NOTE — Progress Notes (Signed)
BP 108/68 mmHg  Pulse 68  Temp(Src) 98.8 F (37.1 C) (Oral)  Ht 5' 8.88" (1.75 m)  Wt 224 lb 8 oz (101.833 kg)  BMI 33.25 kg/m2  SpO2 98%   CC: CPE  Subjective:    Patient ID: Claire Strong, female    DOB: 24-Mar-1961, 54 y.o.   MRN: [id number]  HPI: Claire Strong is a 54 y.o. female presenting on 03/02/2015 for Annual Exam and Depression   S/p Roux en y gastric bypass 2006. Not taking singulair - too expensive.  Depression/anxiety - more anxiety mainly dealing with MIL's estate after she passed away. Keeps grandchildren - some overwhelming feelings. wellbutrin stopped - not effective. She has seen counselor and doctor.   Preventative: COLONOSCOPY Date: 05/2012 hyperplastic polyps x2, mild diverticulosis Arlyce Dice) rpt 10 yrs Last mammogram normal at [facility]. Last pap smear 2009, always normal. S/p hysterectomy for uterine prolapse 1989. Ovaries remain. No pelvic pain.  Flu shot will wait. Tdap 12/19/2010.  Seat belt use discussed. Sunscreen use discussed.  Caffeine: 1 cup coffee/day  Lives with husband [name]) and mother in [place], youngest daughter. 1 dog.  Edu: 12th grade  Occupation: Environmental health practitioner  Activity: taking care of grandson, walking 1-2 mi daily Diet: good water, fruits/vegetables daily   Relevant past medical, surgical, family and social history reviewed and updated as indicated. Interim medical history since our last visit reviewed. Allergies and medications reviewed and updated. Current Outpatient Prescriptions on File Prior to Visit  Medication Sig  . ACETAMINOPHEN-BUTALBITAL 50-325 MG TABS Take 1 tablet by mouth every 4 (four) hours.  Marland Kitchen albuterol (PROVENTIL HFA;VENTOLIN HFA) 108 (90 BASE) MCG/ACT inhaler Inhale 2 puffs into the lungs every 6 (six) hours as needed.  Marland Kitchen aspirin 81 MG tablet Take 81 mg by mouth daily.  . benazepril (LOTENSIN) 10 MG tablet TAKE ONE TABLET BY MOUTH ONCE DAILY  . carisoprodol (SOMA) 350 MG tablet  TAKE ONE TABLET BY MOUTH AT BEDTIME  . cetirizine (ZYRTEC) 10 MG tablet Take 10 mg by mouth daily.  . Cholecalciferol (VITAMIN D3) 1000 UNITS tablet Take 2,000 Units by mouth daily.   . citalopram (CELEXA) 40 MG tablet TAKE ONE TABLET BY MOUTH ONCE DAILY  . diclofenac sodium (VOLTAREN) 1 % GEL Apply 1 application topically 3 (three) times daily. (Patient taking differently: Apply 1 application topically 3 (three) times daily as needed. )  . EPINEPHrine (EPI-PEN) 0.3 mg/0.3 mL DEVI Inject 0.3 mLs (0.3 mg total) into the muscle once.  . Fluticasone-Salmeterol (ADVAIR DISKUS) 100-50 MCG/DOSE AEPB Inhale 1 puff into the lungs every 12 (twelve) hours. (Patient taking differently: Inhale 1 puff into the lungs 2 (two) times daily as needed. )  . furosemide (LASIX) 20 MG tablet TAKE ONE TABLET BY MOUTH ONCE DAILY  . levothyroxine (SYNTHROID, LEVOTHROID) 150 MCG tablet Take 150 mcg by mouth daily before breakfast.  . Magnesium 250 MG TABS Take 1 tablet by mouth daily.  . metoprolol tartrate (LOPRESSOR) 25 MG tablet Take 25 mg by mouth 2 (two) times daily.   . ondansetron (ZOFRAN) 4 MG tablet Take 4 mg by mouth every 8 (eight) hours as needed.   . traMADol (ULTRAM) 50 MG tablet Take 1 tablet (50 mg total) by mouth 2 (two) times daily as needed for pain.  . fluconazole (DIFLUCAN) 150 MG tablet Take 1 tablet (150 mg total) by mouth once. (Patient not taking: Reported on 03/02/2015)  . montelukast (SINGULAIR) 10 MG tablet Take 10 mg by mouth at bedtime.  No current facility-administered medications on file prior to visit.    Review of Systems  Constitutional: Negative for fever, chills, activity change, appetite change, fatigue and unexpected weight change.  HENT: Negative for hearing loss.   Eyes: Negative for visual disturbance.  Respiratory: Negative for cough, chest tightness, shortness of breath and wheezing.   Cardiovascular: Negative for chest pain, palpitations and leg swelling.    Gastrointestinal: Negative for nausea, vomiting, abdominal pain, diarrhea, constipation, blood in stool and abdominal distention.  Genitourinary: Negative for hematuria and difficulty urinating.  Musculoskeletal: Negative for myalgias, arthralgias and neck pain.  Skin: Negative for rash.  Neurological: Negative for dizziness, seizures, syncope and headaches.  Hematological: Negative for adenopathy. Does not bruise/bleed easily.  Psychiatric/Behavioral: Negative for dysphoric mood. The patient is not nervous/anxious.    Per HPI unless specifically indicated above     Objective:    BP 108/68 mmHg  Pulse 68  Temp(Src) 98.8 F (37.1 C) (Oral)  Ht 5' 8.88" (1.75 m)  Wt 224 lb 8 oz (101.833 kg)  BMI 33.25 kg/m2  SpO2 98%  Wt Readings from Last 3 Encounters:  03/02/15 224 lb 8 oz (101.833 kg)  02/05/15 221 lb 8 oz (100.472 kg)  09/20/14 220 lb 4 oz (99.905 kg)    Physical Exam  Constitutional: She is oriented to person, place, and time. She appears well-developed and well-nourished. No distress.  HENT:  Head: Normocephalic and atraumatic.  Right Ear: Hearing, tympanic membrane, external ear and ear canal normal.  Left Ear: Hearing, tympanic membrane, external ear and ear canal normal.  Nose: Nose normal.  Mouth/Throat: Uvula is midline, oropharynx is clear and moist and mucous membranes are normal. No oropharyngeal exudate, posterior oropharyngeal edema or posterior oropharyngeal erythema.  Eyes: Conjunctivae and EOM are normal. Pupils are equal, round, and reactive to light. No scleral icterus.  Neck: Normal range of motion. Neck supple. No thyromegaly present.  Cardiovascular: Normal rate, regular rhythm, normal heart sounds and intact distal pulses.   No murmur heard. Pulses:      Radial pulses are 2+ on the right side, and 2+ on the left side.  Pulmonary/Chest: Effort normal and breath sounds normal. No respiratory distress. She has no wheezes. She has no rales.  Abdominal:  Soft. Bowel sounds are normal. She exhibits no distension and no mass. There is no tenderness. There is no rebound and no guarding.  Musculoskeletal: Normal range of motion. She exhibits no edema.  Lymphadenopathy:    She has no cervical adenopathy.  Neurological: She is alert and oriented to person, place, and time.  CN grossly intact, station and gait intact  Skin: Skin is warm and dry. No rash noted.  Psychiatric: She has a normal mood and affect. Her behavior is normal. Judgment and thought content normal.  Nursing note and vitals reviewed.  Results for orders placed or performed in visit on 03/02/15  HM MAMMOGRAPHY  Result Value Ref Range   HM Mammogram normal per patient       Assessment & Plan:  HIV/HepC next labwork. Problem List Items Addressed This Visit    S/P gastric bypass    Reviewed labwork with patient. For low iron stores - rec iron rich diet.as pt has difficulty tolerating oral iron supplementation.      Horseshoe kidney    Continue to monitor kidney function closely.      HTN (hypertension)    Chronic, stable. Continue current regimen. Pt did not tolerate trying to come off benazepril. Consider  trial in future again if needed.      Depression    Chronic, deteriorated (mostly anxiety). Continue celexa 40mg  daily. Add trazodone nightly.      Relevant Medications   traZODone (DESYREL) 50 MG tablet   Lupus anticoagulant disorder    Continue aspirin daily. No recent blood clots.      Healthcare maintenance - Primary    Preventative protocols reviewed and updated unless pt declined. Discussed healthy diet and lifestyle.       Bronchial asthma    On prn advair. Cannot afford singulair.       Hypothyroidism, postsurgical    Chronic, stable. TSH normal.          Follow up plan: Return in about 1 year (around 03/01/2016), or as needed, for annual exam, prior fasting for blood work.

## 2015-03-28 ENCOUNTER — Other Ambulatory Visit: Payer: Self-pay | Admitting: Family Medicine

## 2015-03-28 NOTE — Telephone Encounter (Signed)
Ok to refill 

## 2015-03-29 NOTE — Telephone Encounter (Signed)
plz phone in. 

## 2015-03-29 NOTE — Telephone Encounter (Signed)
Rx called in as directed.   

## 2015-05-04 ENCOUNTER — Other Ambulatory Visit: Payer: Self-pay | Admitting: Orthopedic Surgery

## 2015-05-04 DIAGNOSIS — G8929 Other chronic pain: Secondary | ICD-10-CM

## 2015-05-04 DIAGNOSIS — M545 Low back pain: Principal | ICD-10-CM

## 2015-05-14 ENCOUNTER — Encounter: Payer: Self-pay | Admitting: Family Medicine

## 2015-05-25 ENCOUNTER — Ambulatory Visit
Admission: RE | Admit: 2015-05-25 | Discharge: 2015-05-25 | Disposition: A | Discharge status: Home / Self Care | Payer: BLUE CROSS/BLUE SHIELD | Source: Ambulatory Visit | Attending: Orthopedic Surgery | Admitting: Orthopedic Surgery

## 2015-05-25 DIAGNOSIS — M5126 Other intervertebral disc displacement, lumbar region: Secondary | ICD-10-CM | POA: Diagnosis not present

## 2015-05-25 DIAGNOSIS — R200 Anesthesia of skin: Secondary | ICD-10-CM | POA: Diagnosis not present

## 2015-05-25 DIAGNOSIS — M545 Low back pain, unspecified: Secondary | ICD-10-CM

## 2015-05-25 DIAGNOSIS — G8929 Other chronic pain: Secondary | ICD-10-CM | POA: Insufficient documentation

## 2015-06-01 ENCOUNTER — Encounter: Payer: Self-pay | Admitting: Primary Care

## 2015-06-01 ENCOUNTER — Ambulatory Visit (INDEPENDENT_AMBULATORY_CARE_PROVIDER_SITE_OTHER): Payer: BLUE CROSS/BLUE SHIELD | Admitting: Primary Care

## 2015-06-01 VITALS — BP 122/70 | HR 60 | Temp 98.0°F | Ht 69.0 in | Wt 231.4 lb

## 2015-06-01 DIAGNOSIS — R05 Cough: Secondary | ICD-10-CM | POA: Diagnosis not present

## 2015-06-01 DIAGNOSIS — R059 Cough, unspecified: Secondary | ICD-10-CM

## 2015-06-01 MED ORDER — HYDROCODONE-HOMATROPINE 5-1.5 MG/5ML PO SYRP: 5.0000 mL | ORAL_SOLUTION | Freq: Every evening | ORAL | Status: DC | PRN

## 2015-06-01 MED ORDER — DOXYCYCLINE HYCLATE 100 MG PO TABS: 100.0000 mg | ORAL_TABLET | Freq: Two times a day (BID) | ORAL | Status: DC

## 2015-06-01 NOTE — Patient Instructions (Signed)
Start Doxycycline antibiotic. Take 1 tablet by mouth twice daily for 7 days.  You may take the Hycodan cough suppressant at bedtime as needed for cough and rest. Caution this medication contains codeine and will make you feel drowsy.  Increase fluids and rest. Please call me if no improvement in the next 3-4 days.   It was a pleasure meeting you!  Acute Bronchitis Bronchitis is inflammation of the airways that extend from the windpipe into the lungs (bronchi). The inflammation often causes mucus to develop. This leads to a cough, which is the most common symptom of bronchitis.  In acute bronchitis, the condition usually develops suddenly and goes away over time, usually in a couple weeks. Smoking, allergies, and asthma can make bronchitis worse. Repeated episodes of bronchitis may cause further lung problems.  CAUSES Acute bronchitis is most often caused by the same virus that causes a cold. The virus can spread from person to person (contagious) through coughing, sneezing, and touching contaminated objects. SIGNS AND SYMPTOMS   Cough.   Fever.   Coughing up mucus.   Body aches.   Chest congestion.   Chills.   Shortness of breath.   Sore throat.  DIAGNOSIS  Acute bronchitis is usually diagnosed through a physical exam. Your health care provider will also ask you questions about your medical history. Tests, such as chest X-rays, are sometimes done to rule out other conditions.  TREATMENT  Acute bronchitis usually goes away in a couple weeks. Oftentimes, no medical treatment is necessary. Medicines are sometimes given for relief of fever or cough. Antibiotic medicines are usually not needed but may be prescribed in certain situations. In some cases, an inhaler may be recommended to help reduce shortness of breath and control the cough. A cool mist vaporizer may also be used to help thin bronchial secretions and make it easier to clear the chest.  HOME CARE INSTRUCTIONS  Get  plenty of rest.   Drink enough fluids to keep your urine clear or pale yellow (unless you have a medical condition that requires fluid restriction). Increasing fluids may help thin your respiratory secretions (sputum) and reduce chest congestion, and it will prevent dehydration.   Take medicines only as directed by your health care provider.  If you were prescribed an antibiotic medicine, finish it all even if you start to feel better.  Avoid smoking and secondhand smoke. Exposure to cigarette smoke or irritating chemicals will make bronchitis worse. If you are a smoker, consider using nicotine gum or skin patches to help control withdrawal symptoms. Quitting smoking will help your lungs heal faster.   Reduce the chances of another bout of acute bronchitis by washing your hands frequently, avoiding people with cold symptoms, and trying not to touch your hands to your mouth, nose, or eyes.   Keep all follow-up visits as directed by your health care provider.  SEEK MEDICAL CARE IF: Your symptoms do not improve after 1 week of treatment.  SEEK IMMEDIATE MEDICAL CARE IF:  You develop an increased fever or chills.   You have chest pain.   You have severe shortness of breath.  You have bloody sputum.   You develop dehydration.  You faint or repeatedly feel like you are going to pass out.  You develop repeated vomiting.  You develop a severe headache. MAKE SURE YOU:   Understand these instructions.  Will watch your condition.  Will get help right away if you are not doing well or get worse.   This  information is not intended to replace advice given to you by your health care provider. Make sure you discuss any questions you have with your health care provider.   Document Released: 07/10/2004 Document Revised: 06/23/2014 Document Reviewed: 11/23/2012 Elsevier Interactive Patient Education [employer]

## 2015-06-01 NOTE — Progress Notes (Signed)
Subjective:    Patient ID: Claire Strong, female    DOB: 1960-10-15, 54 y.o.   MRN: [id number]  HPI  Claire Strong is a 54 year old female who presents today with a chief complaint of cough. Her cough has been intermittent since Thanksgiving. She also reports chest congestion. Denies fevers, chills, SOB. She's been around her brother who was diagnosed with pneumonia. Her cough is non productive and is worse at night. She's been using her inhaler and allergy medication without improvement. Her symptoms became worse over the past week.   Review of Systems  Constitutional: Negative for fever and chills.  HENT: Positive for congestion. Negative for ear pain, sinus pressure and sore throat.   Respiratory: Positive for cough and chest tightness. Negative for shortness of breath.   Cardiovascular: Negative for chest pain.       Past Medical History  Diagnosis Date  . Lupus anticoagulant disorder (HCC)   . Osteoarthritis     ?FM by rheum  . Depression   . HTN (hypertension)   . Kidney stones     s/p renal hematoma after lithotripsy on right  . Horseshoe kidney     sole, R kidney damage  . 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 Jacqulyn Ducking  . History of gastric ulcer 2009  . History of DVT (deep vein thrombosis)     DVTs after 1st pregnancy, not on AC 2/2 bleeding ulcer, greenfield filter in place  . Nasal septal perforation     chronic, ENT rec avoid antihistamine, INS  . Seasonal allergies     allergy shots, singulair  . Multinodular goiter     s/p thyroidectomy  . Bronchial asthma   . Post-surgical hypothyroidism     for multinodular goiter  . Diverticulosis     mild by colonoscopy  . 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 Trula Ore ortho)  . Chronic venous insufficiency 2007    s/p vein stripping and laser ablation  . Fibromyalgia 02/2014    [place]    Social History   Social History  . Marital Status: Married    Spouse Name: Chrissie Noa    . Number of Children: 2  . Years of Education: 12   Occupational History  . 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 Smoking status: Former Smoker    Quit date: 06/16/2004  . Smokeless tobacco: Never Used     Comment: Socially-no longer  . Alcohol Use: No     Comment: Occasionally-wine  . Drug Use: No  . Sexual Activity: Not on file   Other Topics Concern  . Not on file   Social History Narrative   Caffeine: 1 cup coffee/day   Lives with husband [name]) and mother in [place], youngest daughter.  1 dog.   Edu: 12th grade   Occupation: Environmental health practitioner   Activity: taking care of grandson, no regular exercise   Diet: good water, fruits/vegetables daily    Past Surgical History  Procedure Laterality Date  . Gastric bypass  05/21/05    Dr. Jacqulyn Ducking, Roux-en-Y  . Partial hysterectomy  1989    ovaries remain  . Back surgery  2009  . Cholecystectomy  1998  . Appendectomy  1978  . Tonsillectomy  1969  . Nasal septum surgery  1981    deviated septum, repaired at [facility]  . Total thyroidectomy  09/2011  benign path (done for multinodular goiter concern for cancer)  . Colonoscopy  05/2012    hyperplastic polyps x2, mild diverticulosis Arlyce Dice) rpt 10 yrs  . Skin surgery      basel cell  . Cyst excision      back  and shoulder  . Total knee arthroplasty Left 08/2013  . Ankle fusion Left 05/2013  . Greenfield filter removal  2015    removed 6 wks after surgery    Family History  Problem Relation Age of Onset  . Diabetes Mother     prediabetes  . Alzheimer's disease Mother   . Hyperlipidemia Mother   . Diabetes Father   . Coronary artery disease Father 52    CABG  . Alcohol abuse Father   . Cancer Father     lung  . Cancer Maternal Uncle     brain  . Diabetes Paternal Grandmother   . Stroke Neg Hx   . Colon cancer Neg Hx   . Stomach cancer Neg Hx     Allergies  Allergen Reactions  . Sulfa Antibiotics Shortness  Of Breath  . Nutritional Supplements Swelling    Unsure which    Current Outpatient Prescriptions on File Prior to Visit  Medication Sig Dispense Refill  . ACETAMINOPHEN-BUTALBITAL 50-325 MG TABS Take 1 tablet by mouth every 4 (four) hours.    Marland Kitchen albuterol (PROVENTIL HFA;VENTOLIN HFA) 108 (90 BASE) MCG/ACT inhaler Inhale 2 puffs into the lungs every 6 (six) hours as needed. 1 Inhaler 11  . aspirin 81 MG tablet Take 81 mg by mouth daily.    . benazepril (LOTENSIN) 10 MG tablet TAKE ONE TABLET BY MOUTH ONCE DAILY 90 tablet 3  . carisoprodol (SOMA) 350 MG tablet TAKE ONE TABLET BY MOUTH AT BEDTIME 60 tablet 1  . cetirizine (ZYRTEC) 10 MG tablet Take 10 mg by mouth daily.    . Cholecalciferol (VITAMIN D3) 1000 UNITS tablet Take 2,000 Units by mouth daily.     . citalopram (CELEXA) 40 MG tablet TAKE ONE TABLET BY MOUTH ONCE DAILY 90 tablet 3  . diclofenac sodium (VOLTAREN) 1 % GEL Apply 1 application topically 3 (three) times daily. (Patient taking differently: Apply 1 application topically 3 (three) times daily as needed. ) 1 Tube 1  . EPINEPHrine (EPI-PEN) 0.3 mg/0.3 mL DEVI Inject 0.3 mLs (0.3 mg total) into the muscle once. 1 Device 2  . Fluticasone-Salmeterol (ADVAIR DISKUS) 100-50 MCG/DOSE AEPB Inhale 1 puff into the lungs every 12 (twelve) hours. (Patient taking differently: Inhale 1 puff into the lungs 2 (two) times daily as needed. ) 60 each 3  . furosemide (LASIX) 20 MG tablet TAKE ONE TABLET BY MOUTH ONCE DAILY 90 tablet 3  . levothyroxine (SYNTHROID, LEVOTHROID) 150 MCG tablet Take 150 mcg by mouth daily before breakfast.    . Magnesium 250 MG TABS Take 1 tablet by mouth daily.    . metoprolol tartrate (LOPRESSOR) 25 MG tablet Take 25 mg by mouth 2 (two) times daily.     . montelukast (SINGULAIR) 10 MG tablet Take 10 mg by mouth at bedtime.      . ondansetron (ZOFRAN) 4 MG tablet Take 4 mg by mouth every 8 (eight) hours as needed.     . traMADol (ULTRAM) 50 MG tablet Take 1 tablet (50  mg total) by mouth 2 (two) times daily as needed for pain. 40 tablet 0  . traZODone (DESYREL) 50 MG tablet Take 0.5-1 tablets (25-50 mg total) by mouth at bedtime  as needed for sleep. 30 tablet 3  . vitamin B-12 (CYANOCOBALAMIN) 500 MCG tablet Take 500 mcg by mouth daily.    . fluconazole (DIFLUCAN) 150 MG tablet Take 1 tablet (150 mg total) by mouth once. (Patient not taking: Reported on 03/02/2015) 1 tablet 0   No current facility-administered medications on file prior to visit.    BP 122/70 mmHg  Pulse 60  Temp(Src) 98 F (36.7 C) (Oral)  Ht 5\' 9"  (1.753 m)  Wt 231 lb 6.4 oz (104.962 kg)  BMI 34.16 kg/m2  SpO2 99%    Objective:   Physical Exam  Constitutional: She appears well-nourished.  HENT:  Right Ear: Tympanic membrane and ear canal normal.  Left Ear: Tympanic membrane and ear canal normal.  Nose: No mucosal edema. Right sinus exhibits no maxillary sinus tenderness and no frontal sinus tenderness. Left sinus exhibits no maxillary sinus tenderness and no frontal sinus tenderness.  Mouth/Throat: Oropharynx is clear and moist.  Eyes: Conjunctivae are normal.  Neck: Neck supple.  Cardiovascular: Normal rate and regular rhythm.   Pulmonary/Chest: Effort normal. She has rales.  Lymphadenopathy:    She has no cervical adenopathy.  Skin: Skin is warm and dry.          Assessment & Plan:  Acute Bronchitis:  Cough intermittently since Thanksgiving. Worse over the past week. Non productive. Has been around her brother that was diagnosed for pneumonia Exam with rales to upper lobes, no diminished sounds.  Due to duration and examination will treat for acute bacterial bronchitis. Doxycycline BID 7 days and Hycodan PRN. Fluids, rest, return precautions provided.

## 2015-06-04 ENCOUNTER — Encounter: Payer: Self-pay | Admitting: Primary Care

## 2015-06-04 ENCOUNTER — Other Ambulatory Visit: Payer: Self-pay | Admitting: Primary Care

## 2015-06-04 DIAGNOSIS — R05 Cough: Secondary | ICD-10-CM

## 2015-06-04 DIAGNOSIS — R059 Cough, unspecified: Secondary | ICD-10-CM

## 2015-06-04 MED ORDER — PREDNISONE 20 MG PO TABS: 20.0000 mg | ORAL_TABLET | Freq: Every day | ORAL | Status: DC

## 2015-06-13 ENCOUNTER — Other Ambulatory Visit: Payer: Self-pay | Admitting: Family Medicine

## 2015-06-13 ENCOUNTER — Encounter: Payer: Self-pay | Admitting: Primary Care

## 2015-06-20 ENCOUNTER — Encounter: Payer: Self-pay | Admitting: Family Medicine

## 2015-07-04 ENCOUNTER — Other Ambulatory Visit: Payer: Self-pay | Admitting: Family Medicine

## 2015-07-04 ENCOUNTER — Telehealth: Payer: Self-pay

## 2015-07-04 DIAGNOSIS — R202 Paresthesia of skin: Secondary | ICD-10-CM

## 2015-07-04 DIAGNOSIS — M542 Cervicalgia: Secondary | ICD-10-CM

## 2015-07-04 NOTE — Telephone Encounter (Signed)
Received records request Elgie Congo, , forwarded to [facility] for processing.

## 2015-07-06 ENCOUNTER — Ambulatory Visit
Admission: RE | Admit: 2015-07-06 | Discharge: 2015-07-06 | Disposition: A | Discharge status: Home / Self Care | Payer: BLUE CROSS/BLUE SHIELD | Source: Ambulatory Visit | Attending: Family Medicine | Admitting: Family Medicine

## 2015-07-06 ENCOUNTER — Ambulatory Visit: Payer: BLUE CROSS/BLUE SHIELD

## 2015-07-06 ENCOUNTER — Other Ambulatory Visit: Payer: Self-pay | Admitting: Family Medicine

## 2015-07-06 DIAGNOSIS — R202 Paresthesia of skin: Secondary | ICD-10-CM | POA: Diagnosis present

## 2015-07-06 DIAGNOSIS — G542 Cervical root disorders, not elsewhere classified: Secondary | ICD-10-CM | POA: Diagnosis not present

## 2015-07-06 DIAGNOSIS — M542 Cervicalgia: Secondary | ICD-10-CM | POA: Insufficient documentation

## 2015-07-06 DIAGNOSIS — M4802 Spinal stenosis, cervical region: Secondary | ICD-10-CM | POA: Insufficient documentation

## 2015-07-06 DIAGNOSIS — M50322 Other cervical disc degeneration at C5-C6 level: Secondary | ICD-10-CM | POA: Diagnosis not present

## 2015-07-06 DIAGNOSIS — M50223 Other cervical disc displacement at C6-C7 level: Secondary | ICD-10-CM | POA: Insufficient documentation

## 2015-07-20 ENCOUNTER — Other Ambulatory Visit: Payer: Self-pay | Admitting: Family Medicine

## 2015-07-20 NOTE — Telephone Encounter (Signed)
Ok to refill 

## 2015-07-20 NOTE — Telephone Encounter (Signed)
Rx called in as directed.   

## 2015-07-20 NOTE — Telephone Encounter (Signed)
Medicine phoned in.

## 2015-07-20 NOTE — Telephone Encounter (Signed)
Sent in trazodone. plz phone in soma.

## 2015-08-15 HISTORY — PX: OTHER SURGICAL HISTORY: SHX169

## 2015-08-19 ENCOUNTER — Other Ambulatory Visit: Payer: Self-pay | Admitting: Family Medicine

## 2015-08-20 ENCOUNTER — Encounter: Payer: Self-pay | Admitting: Family Medicine

## 2015-08-22 ENCOUNTER — Other Ambulatory Visit: Payer: Self-pay | Admitting: Family Medicine

## 2015-08-22 DIAGNOSIS — Z1231 Encounter for screening mammogram for malignant neoplasm of breast: Secondary | ICD-10-CM

## 2015-08-23 ENCOUNTER — Ambulatory Visit
Admission: RE | Admit: 2015-08-23 | Discharge: 2015-08-23 | Disposition: A | Discharge status: Home / Self Care | Payer: BLUE CROSS/BLUE SHIELD | Source: Ambulatory Visit | Attending: Family Medicine | Admitting: Family Medicine

## 2015-08-23 DIAGNOSIS — Z1231 Encounter for screening mammogram for malignant neoplasm of breast: Secondary | ICD-10-CM | POA: Diagnosis present

## 2015-08-23 HISTORY — DX: Malignant (primary) neoplasm, unspecified: C80.1

## 2015-08-23 LAB — HM MAMMOGRAPHY: HM MAMMO: NORMAL

## 2015-08-24 ENCOUNTER — Encounter: Payer: Self-pay | Admitting: *Deleted

## 2015-09-03 ENCOUNTER — Ambulatory Visit
Admission: RE | Admit: 2015-09-03 | Discharge: 2015-09-03 | Disposition: A | Discharge status: Home / Self Care | Payer: BLUE CROSS/BLUE SHIELD | Source: Ambulatory Visit | Attending: Urology | Admitting: Urology

## 2015-09-03 ENCOUNTER — Encounter: Payer: Self-pay | Admitting: *Deleted

## 2015-09-03 ENCOUNTER — Ambulatory Visit (INDEPENDENT_AMBULATORY_CARE_PROVIDER_SITE_OTHER): Payer: BLUE CROSS/BLUE SHIELD | Admitting: Urology

## 2015-09-03 VITALS — BP 106/73 | HR 56 | Ht 69.0 in | Wt 237.1 lb

## 2015-09-03 DIAGNOSIS — R39198 Other difficulties with micturition: Secondary | ICD-10-CM

## 2015-09-03 DIAGNOSIS — N200 Calculus of kidney: Secondary | ICD-10-CM | POA: Insufficient documentation

## 2015-09-03 DIAGNOSIS — Q631 Lobulated, fused and horseshoe kidney: Secondary | ICD-10-CM

## 2015-09-03 DIAGNOSIS — R143 Flatulence: Secondary | ICD-10-CM | POA: Diagnosis not present

## 2015-09-03 DIAGNOSIS — Z87442 Personal history of urinary calculi: Secondary | ICD-10-CM | POA: Diagnosis not present

## 2015-09-03 LAB — URINALYSIS, COMPLETE
BILIRUBIN UA: NEGATIVE
Glucose, UA: NEGATIVE
Ketones, UA: NEGATIVE
Leukocytes, UA: NEGATIVE
Nitrite, UA: NEGATIVE
PH UA: 5 (ref 5.0–7.5)
Protein, UA: NEGATIVE
RBC UA: NEGATIVE
Specific Gravity, UA: <1.005 — ABNORMAL LOW (ref 1.005–1.030)
UUROB: 0.2 mg/dL (ref 0.2–1.0)

## 2015-09-03 LAB — MICROSCOPIC EXAMINATION
BACTERIA UA: NONE SEEN
EPITHELIAL CELLS (NON RENAL): NONE SEEN /HPF (ref 0–10)
RBC MICROSCOPIC, UA: NONE SEEN /HPF (ref 0–?)
WBC UA: NONE SEEN /HPF (ref 0–?)

## 2015-09-03 NOTE — Progress Notes (Signed)
09/03/2015 9:58 AM   Claire Strong Jun 30, 1960 [id number]  Referring provider: Eustaquio Boyden, MD [address] [place], [place] [address]  Chief Complaint  Patient presents with  . Nephrolithiasis    1 year recheck KUB done    HPI: Patient is a 55 year old Caucasian female with a history of nephrolithiasis who presents today for a 1 year follow-up.  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 She was taken to the OR on 07/05/14 for right URS, LL, stent. She tolerated the procedure well and her stent was removed a few weeks later.  She had a follow-up renal ultrasound which showed no evidence of hydronephrosis, just fullness of the RIGHT kidney consistent with her horseshoe anatomy. She does have a history of nephrolithiasis and underwent ESWL in the past. Unfortunately this was complicated by a perinephric hematoma, bleeding, ureteral obstruction requiring emergent stent placement and hospitalization. During that admission, she was found to have coagulopathy as well.  Stone analysis shows stone composition calcium oxalate and calcium phosphate.  Risk factors for stone production and include history of gastric bypass, horseshoe kidney anatomy. From a dietary behavioral perspective, she does quite well and avoids additional salt, dehydration, and excessive animal proteins.  Today, she states she is not having any nephrolithiasis symptoms.  She did pass a small black fleck with some sludge a few weeks ago, but it was not associated with pain or gross hematuria.  She has not had any recent fevers, chills, nausea or vomiting.  UA today was unremarkable.  KUB taken today did not demonstrate any nephrolithiasis. I have personally reviewed the films myself and with the patient.  Of note, she is experiencing difficulty with urination.  She states that she has to lean forward in order to empty her bladder.  This  causes an accentuation of the pain in her lower back.  Her pain is due to bulging disks, degenerative joint disease and scoliosis.  She feels that she is emptying her bladder completely.  She is not experiencing dysuria or urinary tract infections.  PMH: Past Medical History  Diagnosis Date  . Lupus anticoagulant disorder (HCC)   . Osteoarthritis     ?FM by rheum  . Depression   . HTN (hypertension)   . Kidney stones     s/p renal hematoma after lithotripsy on right  . Horseshoe kidney     sole, R kidney damage  . 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 Jacqulyn Ducking  . History of gastric ulcer 2009  . History of DVT (deep vein thrombosis)     DVTs after 1st pregnancy, not on AC 2/2 bleeding ulcer, greenfield filter in place  . Nasal septal perforation     chronic, ENT rec avoid antihistamine, INS  . Seasonal allergies     allergy shots, singulair  . Multinodular goiter     s/p thyroidectomy  . Bronchial asthma   . Post-surgical hypothyroidism     for multinodular goiter  . Diverticulosis     mild by colonoscopy  . 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 Trula Ore ortho)  . Chronic venous insufficiency 2007    s/p vein stripping and laser ablation  . Fibromyalgia 02/2014    [facility]  . Cancer (HCC)     melanoma  . Bleeding disorder (HCC)   . Hypothyroidism   . Overweight     Surgical History:  Past Surgical History  Procedure Laterality Date  . Gastric bypass  05/21/05    Dr. Jacqulyn Ducking, Roux-en-Y  . Partial hysterectomy  1989    ovaries remain  . Back surgery  2009  . Cholecystectomy  1998  . Appendectomy  1978  . Tonsillectomy  1969  . Nasal septum surgery  1981    deviated septum, repaired at [facility]  . Total thyroidectomy  09/2011    benign path (done for multinodular goiter concern for cancer)  . Colonoscopy  05/2012    hyperplastic polyps x2, mild diverticulosis Arlyce Dice) rpt 10 yrs  . Skin surgery      basel cell  . Cyst excision       back  and shoulder  . Total knee arthroplasty Left 08/2013  . Ankle fusion Left 05/2013  . Greenfield filter removal  2015    removed 6 wks after surgery  . Abdominal hysterectomy    . Lyphoma excision  08/2015    arm    Home Medications:    Medication List       This list is accurate as of: 09/03/15  9:58 AM.  Always use your most recent med list.               ACETAMINOPHEN-BUTALBITAL 50-325 MG Tabs  Take 1 tablet by mouth every 4 (four) hours. Reported on 09/03/2015     albuterol 108 (90 Base) MCG/ACT inhaler  Commonly known as:  PROVENTIL HFA;VENTOLIN HFA  Inhale 2 puffs into the lungs every 6 (six) hours as needed.     aspirin 81 MG tablet  Take 81 mg by mouth daily.     benazepril 10 MG tablet  Commonly known as:  LOTENSIN  TAKE ONE TABLET BY MOUTH ONCE DAILY     carisoprodol 350 MG tablet  Commonly known as:  SOMA  TAKE ONE TABLET BY MOUTH AT BEDTIME     cetirizine 10 MG tablet  Commonly known as:  ZYRTEC  Take 10 mg by mouth daily.     cholecalciferol 1000 units tablet  Commonly known as:  VITAMIN D  Take 2,000 Units by mouth daily.     citalopram 40 MG tablet  Commonly known as:  CELEXA  TAKE ONE TABLET BY MOUTH ONCE DAILY     diclofenac sodium 1 % Gel  Commonly known as:  VOLTAREN  Apply 1 application topically 3 (three) times daily.     doxycycline 100 MG tablet  Commonly known as:  VIBRA-TABS  Take 1 tablet (100 mg total) by mouth 2 (two) times daily.     EPINEPHrine 0.3 mg/0.3 mL Devi  Commonly known as:  EPI-PEN  Inject 0.3 mLs (0.3 mg total) into the muscle once.     ferrous sulfate 325 (65 FE) MG tablet  Take by mouth. Reported on 09/03/2015     fluconazole 150 MG tablet  Commonly known as:  DIFLUCAN  Take 1 tablet (150 mg total) by mouth once.     Fluticasone-Salmeterol 100-50 MCG/DOSE Aepb  Commonly known as:  ADVAIR DISKUS  Inhale 1 puff into the lungs every 12 (twelve) hours.     furosemide 20 MG tablet  Commonly known as:   LASIX  TAKE ONE TABLET BY MOUTH ONCE DAILY     gabapentin 300 MG capsule  Commonly known as:  NEURONTIN  Take by mouth.     HYDROcodone-homatropine 5-1.5 MG/5ML syrup  Commonly known as:  HYCODAN  Take 5 mLs by mouth at bedtime as needed.  levothyroxine 150 MCG tablet  Commonly known as:  SYNTHROID, LEVOTHROID  Take 150 mcg by mouth daily before breakfast.     Magnesium 250 MG Tabs  Take 1 tablet by mouth daily.     metoprolol tartrate 25 MG tablet  Commonly known as:  LOPRESSOR  Take 25 mg by mouth 2 (two) times daily.     metoprolol tartrate 25 MG tablet  Commonly known as:  LOPRESSOR  TAKE ONE TABLET BY MOUTH TWICE DAILY     montelukast 10 MG tablet  Commonly known as:  SINGULAIR  Take 10 mg by mouth at bedtime.     MULTI-VITAMINS Tabs  Take by mouth. Reported on 09/03/2015     ondansetron 4 MG tablet  Commonly known as:  ZOFRAN  Take 4 mg by mouth every 8 (eight) hours as needed. Reported on 09/03/2015     predniSONE 20 MG tablet  Commonly known as:  DELTASONE  Take 1 tablet (20 mg total) by mouth daily with breakfast.     traMADol 50 MG tablet  Commonly known as:  ULTRAM  Take 1 tablet (50 mg total) by mouth 2 (two) times daily as needed for pain.     traZODone 50 MG tablet  Commonly known as:  DESYREL  TAKE ONE-HALF TO ONE TABLET BY MOUTH AT BEDTIME AS NEEDED FOR SLEEP     vitamin B-12 500 MCG tablet  Commonly known as:  CYANOCOBALAMIN  Take 500 mcg by mouth daily.        Allergies:  Allergies  Allergen Reactions  . Sulfa Antibiotics Shortness Of Breath  . Cefuroxime Axetil Other (See Comments)    Bleeding ulcer  . Nutritional Supplements Swelling    Unsure which  . Oxycodone-Acetaminophen Nausea And Vomiting    Family History: Family History  Problem Relation Age of Onset  . Diabetes Mother     prediabetes  . Alzheimer's disease Mother   . Hyperlipidemia Mother   . Diabetes Father   . Coronary artery disease Father 91    CABG  .  Alcohol abuse Father   . Cancer Father     lung  . Cancer Maternal Uncle     brain  . Diabetes Paternal Grandmother   . Stroke Neg Hx   . Colon cancer Neg Hx   . Stomach cancer Neg Hx   . Kidney disease Neg Hx     Social History:  reports that she quit smoking about 11 years ago. She has never used smokeless tobacco. She reports that she does not drink alcohol or use illicit drugs.  ROS: UROLOGY Frequent Urination?: No Hard to postpone urination?: No Burning/pain with urination?: No Get up at night to urinate?: No Leakage of urine?: No Urine stream starts and stops?: No Trouble starting stream?: No Do you have to strain to urinate?: No Blood in urine?: No Urinary tract infection?: No Sexually transmitted disease?: No Injury to kidneys or bladder?: No Painful intercourse?: No Weak stream?: No Currently pregnant?: No Vaginal bleeding?: No Last menstrual period?: n  Gastrointestinal Nausea?: No Vomiting?: No Indigestion/heartburn?: No Diarrhea?: No Constipation?: No  Constitutional Fever: No Night sweats?: No Weight loss?: No Fatigue?: No  Skin Skin rash/lesions?: No Itching?: No  Eyes Blurred vision?: No Double vision?: No  Ears/Nose/Throat Sore throat?: No Sinus problems?: No  Hematologic/Lymphatic Swollen glands?: No Easy bruising?: Yes  Cardiovascular Leg swelling?: No Chest pain?: No  Respiratory Cough?: No Shortness of breath?: No  Endocrine Excessive thirst?: No  Musculoskeletal Back  pain?: Yes Joint pain?: Yes  Neurological Headaches?: No Dizziness?: No  Psychologic Depression?: Yes Anxiety?: No  Physical Exam: BP 106/73 mmHg  Pulse 56  Ht 5\' 9"  (1.753 m)  Wt 237 lb 1.6 oz (107.548 kg)  BMI 35.00 kg/m2  Constitutional: Well nourished. Alert and oriented, No acute distress. HEENT: [place] AT, moist mucus membranes. Trachea midline, no masses. Cardiovascular: No clubbing, cyanosis, or edema. Respiratory: Normal respiratory  effort, no increased work of breathing. GI: Abdomen is soft, non tender, non distended, no abdominal masses. Liver and spleen not palpable.  No hernias appreciated.  Stool sample for occult testing is not indicated.   GU: No CVA tenderness.  No bladder fullness or masses.  Normal external genitalia, normal pubic hair distribution, no lesions.  Normal urethral meatus, no lesions, no prolapse, no discharge.   No urethral masses, tenderness and/or tenderness. No bladder fullness, tenderness or masses. Normal vagina mucosa, good estrogen effect, no discharge, no lesions, good pelvic support, Grade I cystocele is noted.  No rectocele noted.  Uterus and cervix are surgically absent.   No adnexal/parametria masses or tenderness noted.  Anus and perineum are without rashes or lesions.    Skin: No rashes, bruises or suspicious lesions. Lymph: No cervical or inguinal adenopathy. Neurologic: Grossly intact, no focal deficits, moving all 4 extremities. Psychiatric: Normal mood and affect.  Laboratory Data: Lab Results  Component Value Date   WBC 3.7* 02/13/2015   HGB 13.3 02/13/2015   HCT 39.6 02/13/2015   MCV 87.9 02/13/2015   PLT 165.0 02/13/2015   Lab Results  Component Value Date   CREATININE 1.00 02/13/2015   Lab Results  Component Value Date   TSH 0.45 02/13/2015      Component Value Date/Time   CHOL 156 02/13/2015 0816   HDL 41.60 02/13/2015 0816   CHOLHDL 4 02/13/2015 0816   VLDL 16.6 02/13/2015 0816   LDLCALC 98 02/13/2015 0816   Lab Results  Component Value Date   AST 18 02/13/2015   Lab Results  Component Value Date   ALT 13 02/13/2015    Urinalysis Results for orders placed or performed in visit on 09/03/15  Microscopic Examination  Result Value Ref Range   WBC, UA None seen 0 -  5 /hpf   RBC, UA None seen 0 -  2 /hpf   Epithelial Cells (non renal) None seen 0 - 10 /hpf   Bacteria, UA None seen None seen/Few  Urinalysis, Complete  Result Value Ref Range   Specific  Gravity, UA <1.005 (L) 1.005 - 1.030   pH, UA 5.0 5.0 - 7.5   Color, UA Yellow Yellow   Appearance Ur Clear Clear   Leukocytes, UA Negative Negative   Protein, UA Negative Negative/Trace   Glucose, UA Negative Negative   Ketones, UA Negative Negative   RBC, UA Negative Negative   Bilirubin, UA Negative Negative   Urobilinogen, Ur 0.2 0.2 - 1.0 mg/dL   Nitrite, UA Negative Negative   Microscopic Examination See below:     Pertinent Imaging: CLINICAL DATA: Nausea and vomiting  EXAM: ABDOMEN - 1 VIEW  COMPARISON: 05/02/2014  FINDINGS: No disproportionate dilatation of bowel. No obvious free intraperitoneal gas. No pneumatosis. Right upper quadrant calcification adjacent to the gallbladder is stable. Right pelvic vascular calcifications are stable.  IMPRESSION: Nonobstructive bowel-gas pattern.   Electronically Signed  By: Marybelle Killings M.D.  On: 09/03/2015 09:07   Assessment & Plan:    1. History of kidney stones:   KUB  did not demonstrate any nephrolithiasis. Her UA was negative for hematuria.   We'll continue to monitor.  She will return in 1 year for KUB, symptom recheck and UA.  She will contact us sooner if she should experience any gross hematuria or renal colic.  - Urinalysis, Complete  2. Horseshoe kidney:   Normal variant.  We will continue to monitor with yearly UA's and KUB for stones and hematuria.  3. Difficulty urinating:   Patient was found to have a grade 1 cystocele on her vaginal exam today.   I have encouraged timed voiding with the patient since she admits that sometimes she does not know she has to urinate.   I have also offered her urodynamic studies, but due to her back issues she is unable to take long trips in her automobile.   We'll readdress when she returns in 1 year or she will contact us if her urinary symptoms worsen.   Return in about 1 year (around 09/02/2016) for KUB and UA and PVR.  These notes generated with voice recognition  software. I apologize for typographical errors.  Michiel Cowboy, PA-C  [facility] [address] [place], [place] [address] [phone]

## 2015-09-09 ENCOUNTER — Encounter: Payer: Self-pay | Admitting: Family Medicine

## 2015-09-11 ENCOUNTER — Telehealth: Payer: Self-pay | Admitting: Family Medicine

## 2015-09-11 NOTE — Telephone Encounter (Signed)
She can come in for labs or we can just wait until next routine lab visit to check.

## 2015-09-11 NOTE — Telephone Encounter (Signed)
Patient wants to go ahead and have it done. Scheduled appt for tomorrow. Please order. Thanks!

## 2015-09-11 NOTE — Telephone Encounter (Signed)
Pt call stating she is getting reminder call for hep c.  She wanted to know if she could come in for this lab

## 2015-09-12 ENCOUNTER — Other Ambulatory Visit (INDEPENDENT_AMBULATORY_CARE_PROVIDER_SITE_OTHER): Payer: BLUE CROSS/BLUE SHIELD

## 2015-09-12 ENCOUNTER — Other Ambulatory Visit: Payer: Self-pay | Admitting: Family Medicine

## 2015-09-12 DIAGNOSIS — Z1159 Encounter for screening for other viral diseases: Secondary | ICD-10-CM

## 2015-09-13 LAB — HEPATITIS C ANTIBODY: HCV AB: NEGATIVE

## 2015-09-20 ENCOUNTER — Ambulatory Visit: Payer: Self-pay | Admitting: Family Medicine

## 2015-09-24 ENCOUNTER — Other Ambulatory Visit: Payer: Self-pay | Admitting: Family Medicine

## 2015-09-24 NOTE — Telephone Encounter (Signed)
Ok to refill 

## 2015-09-25 ENCOUNTER — Other Ambulatory Visit: Payer: Self-pay | Admitting: Family Medicine

## 2015-09-25 NOTE — Telephone Encounter (Signed)
plz phone in. 

## 2015-09-25 NOTE — Telephone Encounter (Signed)
Medication phoned to pharmacy.  

## 2015-09-26 DIAGNOSIS — M9953 Intervertebral disc stenosis of neural canal of lumbar region: Secondary | ICD-10-CM | POA: Diagnosis not present

## 2015-09-26 DIAGNOSIS — M160 Bilateral primary osteoarthritis of hip: Secondary | ICD-10-CM | POA: Diagnosis not present

## 2015-10-02 DIAGNOSIS — E039 Hypothyroidism, unspecified: Secondary | ICD-10-CM | POA: Diagnosis not present

## 2015-10-02 DIAGNOSIS — M4802 Spinal stenosis, cervical region: Secondary | ICD-10-CM | POA: Diagnosis not present

## 2015-10-02 DIAGNOSIS — M5136 Other intervertebral disc degeneration, lumbar region: Secondary | ICD-10-CM | POA: Diagnosis not present

## 2015-10-02 DIAGNOSIS — M5412 Radiculopathy, cervical region: Secondary | ICD-10-CM | POA: Diagnosis not present

## 2015-10-02 DIAGNOSIS — M5030 Other cervical disc degeneration, unspecified cervical region: Secondary | ICD-10-CM | POA: Diagnosis not present

## 2015-10-05 DIAGNOSIS — E890 Postprocedural hypothyroidism: Secondary | ICD-10-CM | POA: Diagnosis not present

## 2015-10-05 DIAGNOSIS — J3489 Other specified disorders of nose and nasal sinuses: Secondary | ICD-10-CM | POA: Diagnosis not present

## 2015-10-06 ENCOUNTER — Encounter: Payer: Self-pay | Admitting: Family Medicine

## 2015-10-15 ENCOUNTER — Telehealth: Payer: Self-pay

## 2015-10-15 MED ORDER — SCOPOLAMINE 1 MG/3DAYS TD PT72: 1.0000 | MEDICATED_PATCH | TRANSDERMAL | Status: DC

## 2015-10-15 NOTE — Telephone Encounter (Signed)
Pt left v/m requesting transderm scope patch for motion sickness; pt leaving for cruise on 10/29/15 and will be on cruise for 6 days. [facility] [place].

## 2015-10-15 NOTE — Telephone Encounter (Signed)
Patch sent to pharmacy

## 2015-10-29 DIAGNOSIS — L910 Hypertrophic scar: Secondary | ICD-10-CM | POA: Diagnosis not present

## 2015-10-29 DIAGNOSIS — Z1283 Encounter for screening for malignant neoplasm of skin: Secondary | ICD-10-CM | POA: Diagnosis not present

## 2015-10-29 DIAGNOSIS — L720 Epidermal cyst: Secondary | ICD-10-CM | POA: Diagnosis not present

## 2015-10-31 ENCOUNTER — Encounter
Admission: RE | Admit: 2015-10-31 | Discharge: 2015-10-31 | Disposition: A | Discharge status: Home / Self Care | Payer: BLUE CROSS/BLUE SHIELD | Source: Ambulatory Visit | Attending: Orthopedic Surgery | Admitting: Orthopedic Surgery

## 2015-10-31 DIAGNOSIS — Z01812 Encounter for preprocedural laboratory examination: Secondary | ICD-10-CM | POA: Diagnosis not present

## 2015-10-31 HISTORY — DX: Tachycardia, unspecified: R00.0

## 2015-10-31 HISTORY — DX: Unspecified visual loss: H54.7

## 2015-10-31 LAB — URINALYSIS COMPLETE WITH MICROSCOPIC (ARMC ONLY)
BILIRUBIN URINE: NEGATIVE
GLUCOSE, UA: NEGATIVE mg/dL
Hgb urine dipstick: NEGATIVE
KETONES UR: NEGATIVE mg/dL
Leukocytes, UA: NEGATIVE
NITRITE: NEGATIVE
PH: 5 (ref 5.0–8.0)
Protein, ur: NEGATIVE mg/dL
RBC / HPF: NONE SEEN RBC/hpf (ref 0–5)
Specific Gravity, Urine: 1.005 (ref 1.005–1.030)
Squamous Epithelial / LPF: NONE SEEN

## 2015-10-31 LAB — TYPE AND SCREEN
ABO/RH(D): A POS
ANTIBODY SCREEN: NEGATIVE

## 2015-10-31 LAB — BASIC METABOLIC PANEL
Anion gap: 8 (ref 5–15)
BUN: 13 mg/dL (ref 6–20)
CHLORIDE: 101 mmol/L (ref 101–111)
CO2: 27 mmol/L (ref 22–32)
CREATININE: 0.96 mg/dL (ref 0.44–1.00)
Calcium: 8.9 mg/dL (ref 8.9–10.3)
GFR calc Af Amer: >60 mL/min (ref >60)
GLUCOSE: 101 mg/dL — AB (ref 65–99)
Potassium: 4.3 mmol/L (ref 3.5–5.1)
SODIUM: 136 mmol/L (ref 135–145)

## 2015-10-31 LAB — CBC
HEMATOCRIT: 39.5 % (ref 35.0–47.0)
Hemoglobin: 13.3 g/dL (ref 12.0–16.0)
MCH: 28.5 pg (ref 26.0–34.0)
MCHC: 33.6 g/dL (ref 32.0–36.0)
MCV: 85 fL (ref 80.0–100.0)
Platelets: 178 10*3/uL (ref 150–440)
RBC: 4.65 MIL/uL (ref 3.80–5.20)
RDW: 12.8 % (ref 11.5–14.5)
WBC: 4.6 10*3/uL (ref 3.6–11.0)

## 2015-10-31 LAB — SEDIMENTATION RATE: Sed Rate: 7 mm/hr (ref 0–30)

## 2015-10-31 LAB — SURGICAL PCR SCREEN
MRSA, PCR: POSITIVE — AB
STAPHYLOCOCCUS AUREUS: POSITIVE — AB

## 2015-10-31 LAB — PROTIME-INR
INR: 1
PROTHROMBIN TIME: 13.4 s (ref 11.4–15.0)

## 2015-10-31 LAB — APTT: APTT: 30 s (ref 24–36)

## 2015-10-31 LAB — ABO/RH: ABO/RH(D): A POS

## 2015-10-31 NOTE — Patient Instructions (Signed)
  Your procedure is scheduled on: 11/06/15 Tues Report to Same Day Surgery 2nd floor medical mall To find out your arrival time please call [phone] between 1PM - 3PM on 11/05/15 Mon  Remember: Instructions that are not followed completely may result in serious medical risk, up to and including death, or upon the discretion of your surgeon and anesthesiologist your surgery may need to be rescheduled.    _x___ 1. Do not eat food or drink liquids after midnight. No gum chewing or hard candies.     __x__ 2. No Alcohol for 24 hours before or after surgery.   ____ 3. Bring all medications with you on the day of surgery if instructed.    __x__ 4. Notify your doctor if there is any change in your medical condition     (cold, fever, infections).     Do not wear jewelry, make-up, hairpins, clips or nail polish.  Do not wear lotions, powders, or perfumes. You may wear deodorant.  Do not shave 48 hours prior to surgery. Men may shave face and neck.  Do not bring valuables to the hospital.    [facility] is not responsible for any belongings or valuables.               Contacts, dentures or bridgework may not be worn into surgery.  Leave your suitcase in the car. After surgery it may be brought to your room.  For patients admitted to the hospital, discharge time is determined by your treatment team.   Patients discharged the day of surgery will not be allowed to drive home.    Please read over the following fact sheets that you were given:   [facility] Preparing for Surgery and or MRSA Information   _x___ Take these medicines the morning of surgery with A SIP OF WATER:    1. albuterol (PROVENTIL HFA;VENTOLIN HFA  2.benazepril (LOTENSIN) 10 MG tablet  3.citalopram (CELEXA) 40 MG tablet  4.levothyroxine (SYNTHROID, LEVOTHROID) 150 MCG tablet  5.Fluticasone-Salmeterol (ADVAIR DISKUS) 100-50 MCG/DOSE AEPB  6.metoprolol tartrate (LOPRESSOR) 25 MG tablet  7.montelukast (SINGULAIR) 10 MG  tablet  ____ Fleet Enema (as directed)   _x___ Use CHG Soap or sage wipes as directed on instruction sheet   _x___ Use inhalers on the day of surgery and bring to hospital day of surgery  ____ Stop metformin 2 days prior to surgery    ____ Take 1/2 of usual insulin dose the night before surgery and none on the morning of           surgery.   _x__ Stop aspirin or coumadin, or plavix  Stop aspirin today  _x__ Stop Anti-inflammatories such as Advil, Aleve, Ibuprofen, Motrin, Naproxen,          Naprosyn, Goodies powders or aspirin products. Ok to take Tylenol.   ____ Stop supplements until after surgery.    ____ Bring C-Pap to the hospital.

## 2015-10-31 NOTE — Pre-Procedure Instructions (Signed)
Dr Neomia Glass office notified regarding positive MRSA.

## 2015-11-01 LAB — URINE CULTURE

## 2015-11-02 DIAGNOSIS — M5412 Radiculopathy, cervical region: Secondary | ICD-10-CM | POA: Diagnosis not present

## 2015-11-02 DIAGNOSIS — M5030 Other cervical disc degeneration, unspecified cervical region: Secondary | ICD-10-CM | POA: Diagnosis not present

## 2015-11-06 ENCOUNTER — Encounter
Admission: RE | Disposition: A | Discharge status: Home Health | Payer: Self-pay | Source: Ambulatory Visit | Attending: Orthopedic Surgery

## 2015-11-06 ENCOUNTER — Inpatient Hospital Stay
Admission: RE | Admit: 2015-11-06 | Discharge: 2015-11-08 | DRG: 470 | Disposition: A | Discharge status: Home Health | Payer: BLUE CROSS/BLUE SHIELD | Source: Ambulatory Visit | Attending: Orthopedic Surgery | Admitting: Orthopedic Surgery

## 2015-11-06 ENCOUNTER — Inpatient Hospital Stay: Payer: BLUE CROSS/BLUE SHIELD | Admitting: Registered Nurse

## 2015-11-06 ENCOUNTER — Inpatient Hospital Stay: Payer: BLUE CROSS/BLUE SHIELD

## 2015-11-06 DIAGNOSIS — Z882 Allergy status to sulfonamides status: Secondary | ICD-10-CM | POA: Diagnosis not present

## 2015-11-06 DIAGNOSIS — Z86718 Personal history of other venous thrombosis and embolism: Secondary | ICD-10-CM | POA: Diagnosis not present

## 2015-11-06 DIAGNOSIS — M1611 Unilateral primary osteoarthritis, right hip: Secondary | ICD-10-CM | POA: Diagnosis not present

## 2015-11-06 DIAGNOSIS — G8918 Other acute postprocedural pain: Secondary | ICD-10-CM

## 2015-11-06 DIAGNOSIS — Z801 Family history of malignant neoplasm of trachea, bronchus and lung: Secondary | ICD-10-CM

## 2015-11-06 DIAGNOSIS — D62 Acute posthemorrhagic anemia: Secondary | ICD-10-CM | POA: Diagnosis not present

## 2015-11-06 DIAGNOSIS — Z885 Allergy status to narcotic agent status: Secondary | ICD-10-CM

## 2015-11-06 DIAGNOSIS — Z79899 Other long term (current) drug therapy: Secondary | ICD-10-CM

## 2015-11-06 DIAGNOSIS — E890 Postprocedural hypothyroidism: Secondary | ICD-10-CM | POA: Diagnosis present

## 2015-11-06 DIAGNOSIS — Z9884 Bariatric surgery status: Secondary | ICD-10-CM | POA: Diagnosis not present

## 2015-11-06 DIAGNOSIS — Z96642 Presence of left artificial hip joint: Secondary | ICD-10-CM | POA: Diagnosis not present

## 2015-11-06 DIAGNOSIS — Z8711 Personal history of peptic ulcer disease: Secondary | ICD-10-CM

## 2015-11-06 DIAGNOSIS — Z87442 Personal history of urinary calculi: Secondary | ICD-10-CM

## 2015-11-06 DIAGNOSIS — E871 Hypo-osmolality and hyponatremia: Secondary | ICD-10-CM | POA: Diagnosis not present

## 2015-11-06 DIAGNOSIS — Z85820 Personal history of malignant melanoma of skin: Secondary | ICD-10-CM | POA: Diagnosis not present

## 2015-11-06 DIAGNOSIS — Z96641 Presence of right artificial hip joint: Secondary | ICD-10-CM | POA: Diagnosis not present

## 2015-11-06 DIAGNOSIS — I10 Essential (primary) hypertension: Secondary | ICD-10-CM | POA: Diagnosis not present

## 2015-11-06 DIAGNOSIS — Z96652 Presence of left artificial knee joint: Secondary | ICD-10-CM | POA: Diagnosis present

## 2015-11-06 DIAGNOSIS — Z471 Aftercare following joint replacement surgery: Secondary | ICD-10-CM | POA: Diagnosis not present

## 2015-11-06 DIAGNOSIS — Z7982 Long term (current) use of aspirin: Secondary | ICD-10-CM | POA: Diagnosis not present

## 2015-11-06 DIAGNOSIS — I739 Peripheral vascular disease, unspecified: Secondary | ICD-10-CM | POA: Diagnosis not present

## 2015-11-06 DIAGNOSIS — D6862 Lupus anticoagulant syndrome: Secondary | ICD-10-CM | POA: Diagnosis present

## 2015-11-06 DIAGNOSIS — M1990 Unspecified osteoarthritis, unspecified site: Secondary | ICD-10-CM | POA: Diagnosis not present

## 2015-11-06 DIAGNOSIS — Z419 Encounter for procedure for purposes other than remedying health state, unspecified: Secondary | ICD-10-CM

## 2015-11-06 HISTORY — PX: TOTAL HIP ARTHROPLASTY: SHX124

## 2015-11-06 LAB — CREATININE, SERUM
Creatinine, Ser: 0.84 mg/dL (ref 0.44–1.00)
GFR calc Af Amer: >60 mL/min (ref >60)

## 2015-11-06 LAB — CBC
HCT: 38.2 % (ref 35.0–47.0)
Hemoglobin: 12.9 g/dL (ref 12.0–16.0)
MCH: 28.8 pg (ref 26.0–34.0)
MCHC: 33.8 g/dL (ref 32.0–36.0)
MCV: 85.4 fL (ref 80.0–100.0)
PLATELETS: 145 10*3/uL — AB (ref 150–440)
RBC: 4.47 MIL/uL (ref 3.80–5.20)
RDW: 12.9 % (ref 11.5–14.5)
WBC: 6.1 10*3/uL (ref 3.6–11.0)

## 2015-11-06 SURGERY — ARTHROPLASTY, HIP, TOTAL, ANTERIOR APPROACH
Anesthesia: General | Site: Hip | Laterality: Right | Wound class: Clean

## 2015-11-06 MED ORDER — FUROSEMIDE 20 MG PO TABS
20.0000 mg | ORAL_TABLET | Freq: Every day | ORAL | Status: DC
  Administered 2015-11-06 – 2015-11-08 (×3): 20 mg via ORAL
  Filled 2015-11-06 (×3): qty 1

## 2015-11-06 MED ORDER — PROPOFOL 500 MG/50ML IV EMUL
INTRAVENOUS | Status: DC | PRN
  Administered 2015-11-06: 100 ug/kg/min via INTRAVENOUS

## 2015-11-06 MED ORDER — MONTELUKAST SODIUM 10 MG PO TABS
10.0000 mg | ORAL_TABLET | Freq: Every morning | ORAL | Status: DC
  Administered 2015-11-07 – 2015-11-08 (×2): 10 mg via ORAL
  Filled 2015-11-06 (×2): qty 1

## 2015-11-06 MED ORDER — ALBUTEROL SULFATE (2.5 MG/3ML) 0.083% IN NEBU: 2.5000 mg | INHALATION_SOLUTION | Freq: Four times a day (QID) | RESPIRATORY_TRACT | Status: DC | PRN

## 2015-11-06 MED ORDER — MAGNESIUM HYDROXIDE 400 MG/5ML PO SUSP
30.0000 mL | Freq: Every day | ORAL | Status: DC | PRN
  Filled 2015-11-06: qty 30

## 2015-11-06 MED ORDER — METHOCARBAMOL 1000 MG/10ML IJ SOLN
500.0000 mg | Freq: Four times a day (QID) | INTRAVENOUS | Status: DC | PRN
  Filled 2015-11-06: qty 5

## 2015-11-06 MED ORDER — PROPOFOL 10 MG/ML IV BOLUS
INTRAVENOUS | Status: DC | PRN
  Administered 2015-11-06: 80 mg via INTRAVENOUS

## 2015-11-06 MED ORDER — MAGNESIUM CITRATE PO SOLN
1.0000 | Freq: Once | ORAL | Status: DC | PRN
  Filled 2015-11-06: qty 296

## 2015-11-06 MED ORDER — ONDANSETRON HCL 4 MG/2ML IJ SOLN
4.0000 mg | Freq: Four times a day (QID) | INTRAMUSCULAR | Status: DC | PRN
  Administered 2015-11-06 – 2015-11-07 (×3): 4 mg via INTRAVENOUS
  Filled 2015-11-06 (×3): qty 2

## 2015-11-06 MED ORDER — METOPROLOL TARTRATE 25 MG PO TABS
25.0000 mg | ORAL_TABLET | Freq: Two times a day (BID) | ORAL | Status: DC
  Administered 2015-11-06 – 2015-11-08 (×4): 25 mg via ORAL
  Filled 2015-11-06 (×4): qty 1

## 2015-11-06 MED ORDER — ASPIRIN 81 MG PO CHEW
81.0000 mg | CHEWABLE_TABLET | Freq: Every day | ORAL | Status: DC
  Administered 2015-11-06 – 2015-11-08 (×3): 81 mg via ORAL
  Filled 2015-11-06 (×3): qty 1

## 2015-11-06 MED ORDER — METOCLOPRAMIDE HCL 10 MG PO TABS: 5.0000 mg | ORAL_TABLET | Freq: Three times a day (TID) | ORAL | Status: DC | PRN

## 2015-11-06 MED ORDER — ACETAMINOPHEN 650 MG RE SUPP: 650.0000 mg | Freq: Four times a day (QID) | RECTAL | Status: DC | PRN

## 2015-11-06 MED ORDER — DIPHENHYDRAMINE HCL 12.5 MG/5ML PO ELIX
12.5000 mg | ORAL_SOLUTION | ORAL | Status: DC | PRN
  Filled 2015-11-06: qty 10

## 2015-11-06 MED ORDER — LACTATED RINGERS IV SOLN
INTRAVENOUS | Status: DC
  Administered 2015-11-06: 09:00:00 via INTRAVENOUS

## 2015-11-06 MED ORDER — LORATADINE 10 MG PO TABS
10.0000 mg | ORAL_TABLET | Freq: Every day | ORAL | Status: DC
  Administered 2015-11-06 – 2015-11-08 (×3): 10 mg via ORAL
  Filled 2015-11-06 (×3): qty 1

## 2015-11-06 MED ORDER — ACETAMINOPHEN 325 MG PO TABS
650.0000 mg | ORAL_TABLET | Freq: Four times a day (QID) | ORAL | Status: DC | PRN
  Administered 2015-11-07 – 2015-11-08 (×4): 650 mg via ORAL
  Filled 2015-11-06 (×4): qty 2

## 2015-11-06 MED ORDER — LEVOTHYROXINE SODIUM 75 MCG PO TABS
150.0000 ug | ORAL_TABLET | Freq: Every day | ORAL | Status: DC
  Administered 2015-11-07 – 2015-11-08 (×2): 150 ug via ORAL
  Filled 2015-11-06 (×2): qty 2

## 2015-11-06 MED ORDER — BISACODYL 5 MG PO TBEC
5.0000 mg | DELAYED_RELEASE_TABLET | Freq: Every day | ORAL | Status: DC | PRN
  Administered 2015-11-07: 5 mg via ORAL
  Filled 2015-11-06: qty 1

## 2015-11-06 MED ORDER — VITAMIN D 1000 UNITS PO TABS
2000.0000 [IU] | ORAL_TABLET | Freq: Every day | ORAL | Status: DC
  Administered 2015-11-06 – 2015-11-08 (×3): 2000 [IU] via ORAL
  Filled 2015-11-06 (×3): qty 2

## 2015-11-06 MED ORDER — FENTANYL CITRATE (PF) 100 MCG/2ML IJ SOLN
INTRAMUSCULAR | Status: DC | PRN
  Administered 2015-11-06: 50 ug via INTRAVENOUS

## 2015-11-06 MED ORDER — MENTHOL 3 MG MT LOZG
1.0000 | LOZENGE | OROMUCOSAL | Status: DC | PRN
  Filled 2015-11-06: qty 9

## 2015-11-06 MED ORDER — FAMOTIDINE 20 MG PO TABS
ORAL_TABLET | ORAL | Status: AC
  Administered 2015-11-06: 20 mg via ORAL
  Filled 2015-11-06: qty 1

## 2015-11-06 MED ORDER — GLYCOPYRROLATE 0.2 MG/ML IJ SOLN
INTRAMUSCULAR | Status: DC | PRN
  Administered 2015-11-06: 0.2 mg via INTRAVENOUS

## 2015-11-06 MED ORDER — FLUTICASONE FUROATE-VILANTEROL 100-25 MCG/INH IN AEPB
1.0000 | INHALATION_SPRAY | Freq: Every day | RESPIRATORY_TRACT | Status: DC
  Filled 2015-11-06: qty 28

## 2015-11-06 MED ORDER — METHOCARBAMOL 500 MG PO TABS
500.0000 mg | ORAL_TABLET | Freq: Four times a day (QID) | ORAL | Status: DC | PRN
  Administered 2015-11-06 – 2015-11-07 (×4): 500 mg via ORAL
  Filled 2015-11-06 (×4): qty 1

## 2015-11-06 MED ORDER — PHENOL 1.4 % MT LIQD
1.0000 | OROMUCOSAL | Status: DC | PRN
  Filled 2015-11-06: qty 177

## 2015-11-06 MED ORDER — MIDAZOLAM HCL 5 MG/5ML IJ SOLN
INTRAMUSCULAR | Status: DC | PRN
  Administered 2015-11-06: 1 mg via INTRAVENOUS

## 2015-11-06 MED ORDER — SCOPOLAMINE 1 MG/3DAYS TD PT72
1.0000 | MEDICATED_PATCH | TRANSDERMAL | Status: DC
  Filled 2015-11-06: qty 1

## 2015-11-06 MED ORDER — VANCOMYCIN HCL IN DEXTROSE 1-5 GM/200ML-% IV SOLN
INTRAVENOUS | Status: AC
  Administered 2015-11-06: 1000 mg via INTRAVENOUS
  Filled 2015-11-06: qty 200

## 2015-11-06 MED ORDER — NEOMYCIN-POLYMYXIN B GU 40-200000 IR SOLN
Status: AC
Start: 2015-11-06 — End: 2015-11-06
  Filled 2015-11-06: qty 4

## 2015-11-06 MED ORDER — METOCLOPRAMIDE HCL 5 MG/ML IJ SOLN: 5.0000 mg | Freq: Three times a day (TID) | INTRAMUSCULAR | Status: DC | PRN

## 2015-11-06 MED ORDER — VITAMIN B-12 1000 MCG PO TABS
500.0000 ug | ORAL_TABLET | Freq: Every day | ORAL | Status: DC
Start: 2015-11-06 — End: 2015-11-07
  Administered 2015-11-06: 500 ug via ORAL
  Filled 2015-11-06: qty 2
  Filled 2015-11-06: qty 1

## 2015-11-06 MED ORDER — BUPIVACAINE HCL (PF) 0.5 % IJ SOLN
INTRAMUSCULAR | Status: DC | PRN
Start: 2015-11-06 — End: 2015-11-06
  Administered 2015-11-06: 3 mL

## 2015-11-06 MED ORDER — BUPIVACAINE-EPINEPHRINE 0.25% -1:200000 IJ SOLN
INTRAMUSCULAR | Status: DC | PRN
  Administered 2015-11-06: 30 mL

## 2015-11-06 MED ORDER — MORPHINE SULFATE (PF) 2 MG/ML IV SOLN
2.0000 mg | INTRAVENOUS | Status: DC | PRN
  Administered 2015-11-06: 2 mg via INTRAVENOUS
  Filled 2015-11-06: qty 1

## 2015-11-06 MED ORDER — CEFAZOLIN SODIUM-DEXTROSE 2-4 GM/100ML-% IV SOLN
INTRAVENOUS | Status: AC
  Filled 2015-11-06: qty 100

## 2015-11-06 MED ORDER — CEFAZOLIN SODIUM-DEXTROSE 2-4 GM/100ML-% IV SOLN
2.0000 g | Freq: Four times a day (QID) | INTRAVENOUS | Status: AC
  Administered 2015-11-06 – 2015-11-07 (×3): 2 g via INTRAVENOUS
  Filled 2015-11-06 (×3): qty 100

## 2015-11-06 MED ORDER — SODIUM CHLORIDE 0.9 % IV SOLN
INTRAVENOUS | Status: DC
  Administered 2015-11-06: 16:00:00 via INTRAVENOUS

## 2015-11-06 MED ORDER — NEOMYCIN-POLYMYXIN B GU 40-200000 IR SOLN
Status: DC | PRN
  Administered 2015-11-06: 4 mL

## 2015-11-06 MED ORDER — FENTANYL CITRATE (PF) 100 MCG/2ML IJ SOLN: 25.0000 ug | INTRAMUSCULAR | Status: DC | PRN

## 2015-11-06 MED ORDER — TRANEXAMIC ACID 1000 MG/10ML IV SOLN
INTRAVENOUS | Status: AC
  Filled 2015-11-06: qty 10

## 2015-11-06 MED ORDER — DOCUSATE SODIUM 100 MG PO CAPS
100.0000 mg | ORAL_CAPSULE | Freq: Two times a day (BID) | ORAL | Status: DC
  Administered 2015-11-06 – 2015-11-08 (×5): 100 mg via ORAL
  Filled 2015-11-06 (×5): qty 1

## 2015-11-06 MED ORDER — GABAPENTIN 300 MG PO CAPS
300.0000 mg | ORAL_CAPSULE | ORAL | Status: DC
  Administered 2015-11-07: 300 mg via ORAL
  Filled 2015-11-06: qty 1

## 2015-11-06 MED ORDER — CEFAZOLIN SODIUM-DEXTROSE 2-4 GM/100ML-% IV SOLN
2.0000 g | Freq: Once | INTRAVENOUS | Status: AC
  Administered 2015-11-06: 2 g via INTRAVENOUS

## 2015-11-06 MED ORDER — ENOXAPARIN SODIUM 40 MG/0.4ML ~~LOC~~ SOLN
40.0000 mg | SUBCUTANEOUS | Status: DC
  Administered 2015-11-07 – 2015-11-08 (×2): 40 mg via SUBCUTANEOUS
  Filled 2015-11-06 (×2): qty 0.4

## 2015-11-06 MED ORDER — CARISOPRODOL 350 MG PO TABS
350.0000 mg | ORAL_TABLET | Freq: Every day | ORAL | Status: DC
  Administered 2015-11-06 – 2015-11-07 (×2): 350 mg via ORAL
  Filled 2015-11-06 (×2): qty 1

## 2015-11-06 MED ORDER — ONDANSETRON HCL 4 MG/2ML IJ SOLN: 4.0000 mg | Freq: Once | INTRAMUSCULAR | Status: DC | PRN

## 2015-11-06 MED ORDER — BENAZEPRIL HCL 20 MG PO TABS
10.0000 mg | ORAL_TABLET | Freq: Every day | ORAL | Status: DC
  Administered 2015-11-06 – 2015-11-07 (×2): 10 mg via ORAL
  Filled 2015-11-06 (×3): qty 1

## 2015-11-06 MED ORDER — TRAZODONE HCL 50 MG PO TABS
50.0000 mg | ORAL_TABLET | Freq: Every day | ORAL | Status: DC
  Administered 2015-11-06 – 2015-11-07 (×2): 50 mg via ORAL
  Filled 2015-11-06 (×2): qty 1

## 2015-11-06 MED ORDER — VANCOMYCIN HCL IN DEXTROSE 1-5 GM/200ML-% IV SOLN
1000.0000 mg | Freq: Once | INTRAVENOUS | Status: AC
  Administered 2015-11-06: 1000 mg via INTRAVENOUS

## 2015-11-06 MED ORDER — CITALOPRAM HYDROBROMIDE 20 MG PO TABS
40.0000 mg | ORAL_TABLET | Freq: Every day | ORAL | Status: DC
  Administered 2015-11-07 – 2015-11-08 (×2): 40 mg via ORAL
  Filled 2015-11-06 (×2): qty 2

## 2015-11-06 MED ORDER — EPHEDRINE SULFATE 50 MG/ML IJ SOLN
INTRAMUSCULAR | Status: DC | PRN
  Administered 2015-11-06 (×3): 10 mg via INTRAVENOUS

## 2015-11-06 MED ORDER — ALBUTEROL SULFATE HFA 108 (90 BASE) MCG/ACT IN AERS: 2.0000 | INHALATION_SPRAY | Freq: Four times a day (QID) | RESPIRATORY_TRACT | Status: DC | PRN

## 2015-11-06 MED ORDER — ONDANSETRON HCL 4 MG PO TABS: 4.0000 mg | ORAL_TABLET | Freq: Four times a day (QID) | ORAL | Status: DC | PRN

## 2015-11-06 MED ORDER — EPINEPHRINE 0.3 MG/0.3ML IJ SOAJ
0.3000 mg | Freq: Once | INTRAMUSCULAR | Status: DC
  Filled 2015-11-06: qty 0.3

## 2015-11-06 MED ORDER — PHENYLEPHRINE HCL 10 MG/ML IJ SOLN
INTRAMUSCULAR | Status: DC | PRN
  Administered 2015-11-06: 100 ug via INTRAVENOUS
  Administered 2015-11-06: 50 ug via INTRAVENOUS
  Administered 2015-11-06: 100 ug via INTRAVENOUS
  Administered 2015-11-06: 50 ug via INTRAVENOUS
  Administered 2015-11-06: 100 ug via INTRAVENOUS

## 2015-11-06 MED ORDER — BUPIVACAINE-EPINEPHRINE (PF) 0.25% -1:200000 IJ SOLN
INTRAMUSCULAR | Status: AC
  Filled 2015-11-06: qty 30

## 2015-11-06 MED ORDER — HYDROCODONE-ACETAMINOPHEN 7.5-325 MG PO TABS
1.0000 | ORAL_TABLET | ORAL | Status: DC | PRN
  Administered 2015-11-06 – 2015-11-07 (×4): 2 via ORAL
  Filled 2015-11-06 (×4): qty 2

## 2015-11-06 MED ORDER — ZOLPIDEM TARTRATE 5 MG PO TABS
5.0000 mg | ORAL_TABLET | Freq: Every evening | ORAL | Status: DC | PRN
Start: 2015-11-06 — End: 2015-11-08

## 2015-11-06 MED ORDER — BUTALBITAL-APAP-CAFFEINE 50-325-40 MG PO TABS
1.0000 | ORAL_TABLET | Freq: Every day | ORAL | Status: DC
Start: 2015-11-06 — End: 2015-11-08
  Filled 2015-11-06: qty 1

## 2015-11-06 MED ORDER — FAMOTIDINE 20 MG PO TABS
20.0000 mg | ORAL_TABLET | Freq: Once | ORAL | Status: AC
  Administered 2015-11-06: 20 mg via ORAL

## 2015-11-06 MED ORDER — ALUM & MAG HYDROXIDE-SIMETH 200-200-20 MG/5ML PO SUSP: 30.0000 mL | ORAL | Status: DC | PRN

## 2015-11-06 SURGICAL SUPPLY — 44 items
BLADE SAW SAG 18.5X105 (BLADE) ×2 IMPLANT
BNDG COHESIVE 6X5 TAN STRL LF (GAUZE/BANDAGES/DRESSINGS) ×4 IMPLANT
CANISTER SUCT 1200ML W/VALVE (MISCELLANEOUS) ×2 IMPLANT
CAPT HIP TOTAL 3 ×2 IMPLANT
CATH FOL LEG HOLDER (MISCELLANEOUS) ×2 IMPLANT
CATH TRAY METER 16FR LF (MISCELLANEOUS) ×2 IMPLANT
CHLORAPREP W/TINT 26ML (MISCELLANEOUS) ×2 IMPLANT
DRAPE C-ARM XRAY 36X54 (DRAPES) ×2 IMPLANT
DRAPE INCISE IOBAN 66X60 STRL (DRAPES) IMPLANT
DRAPE POUCH INSTRU U-SHP 10X18 (DRAPES) ×2 IMPLANT
DRAPE SHEET LG 3/4 BI-LAMINATE (DRAPES) ×6 IMPLANT
DRAPE STERI IOBAN 125X83 (DRAPES) ×2 IMPLANT
DRAPE TABLE BACK 80X90 (DRAPES) ×2 IMPLANT
DRSG OPSITE POSTOP 4X8 (GAUZE/BANDAGES/DRESSINGS) ×4 IMPLANT
ELECT BLADE 6.5 EXT (BLADE) ×2 IMPLANT
GAUZE SPONGE 4X4 12PLY STRL (GAUZE/BANDAGES/DRESSINGS) ×2 IMPLANT
GLOVE BIOGEL PI IND STRL 9 (GLOVE) ×1 IMPLANT
GLOVE BIOGEL PI INDICATOR 9 (GLOVE) ×1
GLOVE SURG ORTHO 9.0 STRL STRW (GLOVE) ×4 IMPLANT
GOWN STRL REUS W/ TWL LRG LVL3 (GOWN DISPOSABLE) ×1 IMPLANT
GOWN STRL REUS W/TWL LRG LVL3 (GOWN DISPOSABLE) ×1
GOWN SURG XXL (GOWNS) ×2 IMPLANT
HEMOVAC 400CC 10FR (MISCELLANEOUS) ×2 IMPLANT
HOOD PEEL AWAY FLYTE STAYCOOL (MISCELLANEOUS) ×2 IMPLANT
MAT BLUE FLOOR 46X72 FLO (MISCELLANEOUS) ×2 IMPLANT
NDL SAFETY 18GX1.5 (NEEDLE) ×2 IMPLANT
NEEDLE SPNL 18GX3.5 QUINCKE PK (NEEDLE) ×2 IMPLANT
NS IRRIG 1000ML POUR BTL (IV SOLUTION) ×2 IMPLANT
PACK HIP COMPR (MISCELLANEOUS) ×2 IMPLANT
SOL PREP PVP 2OZ (MISCELLANEOUS) ×2
SOLUTION PREP PVP 2OZ (MISCELLANEOUS) ×1 IMPLANT
STAPLER SKIN PROX 35W (STAPLE) ×2 IMPLANT
STRAP SAFETY BODY (MISCELLANEOUS) ×2 IMPLANT
SUT DVC 2 QUILL PDO  T11 36X36 (SUTURE) ×1
SUT DVC 2 QUILL PDO T11 36X36 (SUTURE) ×1 IMPLANT
SUT DVC QUILL MONODERM 30X30 (SUTURE) ×2 IMPLANT
SUT SILK 0 (SUTURE) ×1
SUT SILK 0 30XBRD TIE 6 (SUTURE) ×1 IMPLANT
SUT VIC AB 1 CT1 36 (SUTURE) ×2 IMPLANT
SYR 20CC LL (SYRINGE) ×2 IMPLANT
SYR 30ML LL (SYRINGE) ×2 IMPLANT
TAPE MICROFOAM 4IN (TAPE) ×2 IMPLANT
TOWEL OR 17X26 4PK STRL BLUE (TOWEL DISPOSABLE) ×2 IMPLANT
TUBE KAMVAC SUCTION (TUBING) ×2 IMPLANT

## 2015-11-06 NOTE — Anesthesia Preprocedure Evaluation (Signed)
Anesthesia Evaluation  Patient identified by MRN, date of birth, ID band Patient awake    Reviewed: Allergy & Precautions, H&P , NPO status , Patient's Chart, lab work & pertinent test results, reviewed documented beta blocker date and time   Airway Mallampati: II  TM Distance: >3 FB Neck ROM: full    Dental  (+) Teeth Intact   Pulmonary neg pulmonary ROS, asthma , former smoker,    Pulmonary exam normal        Cardiovascular hypertension, + Peripheral Vascular Disease  negative cardio ROS Normal cardiovascular exam Rhythm:regular Rate:Normal     Neuro/Psych  Headaches, PSYCHIATRIC DISORDERS  Neuromuscular disease negative neurological ROS  negative psych ROS   GI/Hepatic negative GI ROS, Neg liver ROS,   Endo/Other  negative endocrine ROSHypothyroidism   Renal/GU Renal diseasenegative Renal ROS  negative genitourinary   Musculoskeletal   Abdominal   Peds  Hematology negative hematology ROS (+)   Anesthesia Other Findings Past Medical History:   Lupus anticoagulant disorder (HCC)                           Osteoarthritis                                                 Comment:?FM by rheum   Depression                                                   HTN (hypertension)                                           S/P gastric bypass                              05/21/2005      Comment:Dr. Jacqulyn Ducking   History of gastric ulcer                        2009         History of DVT (deep vein thrombosis)                          Comment:DVTs after 1st pregnancy, not on AC 2/2               bleeding ulcer, greenfield filter in place   Nasal septal perforation                                       Comment:chronic, ENT rec avoid antihistamine, INS   Seasonal allergies                                             Comment:allergy shots, singulair   Multinodular goiter  Comment:s/p  thyroidectomy   Bronchial asthma                                             Post-surgical hypothyroidism                                   Comment:for multinodular goiter   Diverticulosis                                                 Comment:mild by colonoscopy   Cavovarus deformity of foot                                    Comment:bilateral, with L 5th bunionette (Dr. Trula Ore ortho)   Chronic venous insufficiency                    2007           Comment:s/p vein stripping and laser ablation   Fibromyalgia                                    02/2014         Comment:[facility]   Cancer (HCC)                                                   Comment:melanoma   Bleeding disorder (HCC)                                      Hypothyroidism                                               Overweight                                                   Kidney stones                                                  Comment:s/p renal hematoma after lithotripsy on right   Horseshoe kidney  Comment:sole, R kidney damage   Sight deterioration                                            Comment:disc in back   Tachycardia                                                Past Surgical History:   GASTRIC BYPASS                                   05/21/05        Comment:Dr. Jacqulyn Ducking, Roux-en-Y   PARTIAL HYSTERECTOMY                             1989           Comment:ovaries remain   BACK SURGERY                                     2009         CHOLECYSTECTOMY                                  1998         APPENDECTOMY                                     1978         TONSILLECTOMY                                    1969         NASAL SEPTUM SURGERY                             1981           Comment:deviated septum, repaired at [facility]   TOTAL THYROIDECTOMY                              09/2011         Comment:benign path (done for multinodular goiter                concern for cancer)   COLONOSCOPY                                      05/2012        Comment:hyperplastic polyps x2, mild diverticulosis               Arlyce Dice) rpt 10 yrs   SKIN SURGERY  Comment:basel cell   CYST EXCISION                                                   Comment:back  and shoulder   TOTAL KNEE ARTHROPLASTY                         Left 08/2013       ANKLE FUSION                                    Left 05/2013      Greenfield filter removal                        2015           Comment:removed 6 wks after surgery   ABDOMINAL HYSTERECTOMY                                        lyphoma excision                                 08/2015        Comment:arm   esi                                             Bilateral 06/2015, 3*     Comment:L4/5 transforaminal ESI ([facility]asnis)   hammerhead toes                                 Left              HERNIA REPAIR                                               BMI    Body Mass Index   34.54 kg/m 2     Reproductive/Obstetrics negative OB ROS                             Anesthesia Physical Anesthesia Plan  ASA: III  Anesthesia Plan: Spinal and General   Post-op Pain Management:    Induction:   Airway Management Planned:   Additional Equipment:   Intra-op Plan:   Post-operative Plan:   Informed Consent: I have reviewed the patients History and Physical, chart, labs and discussed the procedure including the risks, benefits and alternatives for the proposed anesthesia with the patient or authorized representative who has indicated his/her understanding and acceptance.   Dental Advisory Given  Plan Discussed with: CRNA  Anesthesia Plan Comments:         Anesthesia Quick Evaluation

## 2015-11-06 NOTE — Anesthesia Procedure Notes (Signed)
Spinal  Start time: 11/06/2015 10:24 AM End time: 11/06/2015 10:33 AM Staffing Anesthesiologist: Yevette Edwards Resident/CRNA: Omer Jack Performed by: resident/CRNA  Preanesthetic Checklist Completed: patient identified, site marked, surgical consent, pre-op evaluation, timeout performed, IV checked, risks and benefits discussed and monitors and equipment checked Spinal Block Patient position: sitting Prep: Betadine Patient monitoring: heart rate, continuous pulse ox and blood pressure Approach: midline Location: L3-4 Needle Needle type: [place]  Needle gauge: 25 G Needle length: 9 cm Catheter size: 19 g Assessment Sensory level: T8

## 2015-11-06 NOTE — Transfer of Care (Signed)
Immediate Anesthesia Transfer of Care Note  Patient: Claire Strong  Procedure(s) Performed: Procedure(s): TOTAL HIP ARTHROPLASTY ANTERIOR APPROACH (Right)  Patient Location: PACU  Anesthesia Type:Spinal  Level of Consciousness: awake, alert  and oriented  Airway & Oxygen Therapy: Patient Spontanous Breathing and Patient connected to nasal cannula oxygen  Post-op Assessment: Report given to RN and Post -op Vital signs reviewed and stable  Post vital signs: Reviewed and stable  Last Vitals:  Filed Vitals:   11/06/15 0850 11/06/15 1229  BP: 132/94 101/61  Pulse: 55 69  Temp: 36.8 C 36.4 C  Resp: 18 16    Last Pain:  Filed Vitals:   11/06/15 1230  PainSc: 5       Patients Stated Pain Goal: 0 (11/06/15 0850)  Complications: No apparent anesthesia complications

## 2015-11-06 NOTE — H&P (Signed)
Reviewed paper H+P, will be scanned into chart. No changes noted.  

## 2015-11-06 NOTE — NC FL2 (Signed)
[place] LEVEL OF CARE SCREENING TOOL     IDENTIFICATION  Patient Name: Claire Strong Birthdate: 26-Feb-1961 Sex: female Admission Date (Current Location): 11/06/2015  [place] and [place] Number:  Engineering geologist and Address:  [facility], [address], [place], [place] [address]      Provider Number: [id number]  Attending Physician Name and Address:  Hessie Knows, MD  Relative Name and Phone Number:       Current Level of Care: Hospital Recommended Level of Care: [place] Prior Approval Number:    Date Approved/Denied:   PASRR Number:  [id number])  Discharge Plan: SNF    Current Diagnoses: Patient Active Problem List   Diagnosis Date Noted  . Primary osteoarthritis of right hip 11/06/2015  . History of kidney stones 09/03/2015  . Difficulty urinating 09/03/2015  . Osteoarthritis   . Right sided abdominal pain 05/10/2014  . Fibromyalgia 02/14/2014  . Atrial tachycardia ([place]) 01/25/2013  . Hypothyroidism, postsurgical 02/25/2012  . Edema 01/28/2012  . Headache(784.0) 09/25/2011  . Bronchial asthma   . Healthcare maintenance 03/18/2011  . Hot flashes 03/18/2011  . Nasal septal perforation   . Horseshoe kidney   . HTN (hypertension)   . Depression   . Lupus anticoagulant disorder ([place])   . 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    Orientation RESPIRATION BLADDER Height & Weight     Self, Time, Situation, Place  Normal Continent Weight: 234 lb (106.142 kg) Height:  5\' 9"  (175.3 cm)  BEHAVIORAL SYMPTOMS/MOOD NEUROLOGICAL BOWEL NUTRITION STATUS   (none )  (none ) Continent Diet (Regular Diet )  AMBULATORY STATUS COMMUNICATION OF NEEDS Skin   Extensive Assist Verbally Surgical wounds, Wound Vac (Incision: Right Hip. )                       Personal Care Assistance Level of Assistance  Bathing, Feeding, Dressing Bathing Assistance: Limited assistance Feeding assistance: Independent Dressing  Assistance: Limited assistance     Functional Limitations Info  Sight, Hearing, Speech Sight Info: Impaired Hearing Info: Adequate Speech Info: Adequate    SPECIAL CARE FACTORS FREQUENCY  PT (By licensed PT), OT (By licensed OT)     PT Frequency:  (5) OT Frequency:  (5)            Contractures      Additional Factors Info  Code Status, Allergies, Isolation Precautions Code Status Info:  (not on file ) Allergies Info:  (Sulfa Antibiotics, Cefuroxime Axetil, Nutritional Supplements, Oxycodone-acetaminophen)     Isolation Precautions Info:  (MRSA Nasal Swab )     Current Medications (11/06/2015):  This is the current hospital active medication list Current Facility-Administered Medications  Medication Dose Route Frequency Provider Last Rate Last Dose  . 0.9 %  sodium chloride infusion   Intravenous Continuous Hessie Knows, MD 100 mL/hr at 11/06/15 1534    . acetaminophen (TYLENOL) tablet 650 mg  650 mg Oral Q6H PRN Hessie Knows, MD       Or  . acetaminophen (TYLENOL) suppository 650 mg  650 mg Rectal Q6H PRN Hessie Knows, MD      . albuterol (PROVENTIL) (2.5 MG/3ML) 0.083% nebulizer solution 2.5 mg  2.5 mg Nebulization Q6H PRN Hessie Knows, MD      . alum & mag hydroxide-simeth (MAALOX/MYLANTA) 200-200-20 MG/5ML suspension 30 mL  30 mL Oral Q4H PRN Hessie Knows, MD      . aspirin chewable tablet 81 mg  81 mg  Oral Daily Kennedy Bucker, MD      . benazepril (LOTENSIN) tablet 10 mg  10 mg Oral Daily Kennedy Bucker, MD      . bisacodyl (DULCOLAX) EC tablet 5 mg  5 mg Oral Daily PRN Kennedy Bucker, MD      . butalbital-acetaminophen-caffeine (FIORICET, ESGIC) [phone] MG per tablet 1 tablet  1 tablet Oral QHS Kennedy Bucker, MD      . carisoprodol (SOMA) tablet 350 mg  350 mg Oral QHS Kennedy Bucker, MD      . ceFAZolin (ANCEF) 2-4 GM/100ML-% IVPB           . ceFAZolin (ANCEF) IVPB 2g/100 mL premix  2 g Intravenous Q6H Kennedy Bucker, MD      . cholecalciferol (VITAMIN D) tablet 2,000  Units  2,000 Units Oral Daily Kennedy Bucker, MD      . Melene Muller ON 11/07/2015] citalopram (CELEXA) tablet 40 mg  40 mg Oral Daily Kennedy Bucker, MD      . diphenhydrAMINE (BENADRYL) 12.5 MG/5ML elixir 12.5-25 mg  12.5-25 mg Oral Q4H PRN Kennedy Bucker, MD      . docusate sodium (COLACE) capsule 100 mg  100 mg Oral BID Kennedy Bucker, MD      . Melene Muller ON 11/07/2015] enoxaparin (LOVENOX) injection 40 mg  40 mg Subcutaneous Q24H Kennedy Bucker, MD      . EPINEPHrine (EPI-PEN) injection 0.3 mg  0.3 mg Intramuscular Once Kennedy Bucker, MD      . Melene Muller ON 11/07/2015] fluticasone furoate-vilanterol (BREO ELLIPTA) 100-25 MCG/INH 1 puff  1 puff Inhalation Daily Kennedy Bucker, MD      . furosemide (LASIX) tablet 20 mg  20 mg Oral Daily Kennedy Bucker, MD      . Melene Muller ON 11/07/2015] gabapentin (NEURONTIN) capsule 300 mg  300 mg Oral Once per day on Mon Wed Fri Kennedy Bucker, MD      . HYDROcodone-acetaminophen [facility]) 7.5-325 MG per tablet 1-2 tablet  1-2 tablet Oral Q4H PRN Kennedy Bucker, MD      . Melene Muller ON 11/07/2015] levothyroxine (SYNTHROID, LEVOTHROID) tablet 150 mcg  150 mcg Oral QAC breakfast Kennedy Bucker, MD      . loratadine (CLARITIN) tablet 10 mg  10 mg Oral Daily Kennedy Bucker, MD      . magnesium citrate solution 1 Bottle  1 Bottle Oral Once PRN Kennedy Bucker, MD      . magnesium hydroxide (MILK OF MAGNESIA) suspension 30 mL  30 mL Oral Daily PRN Kennedy Bucker, MD      . menthol-cetylpyridinium (CEPACOL) lozenge 3 mg  1 lozenge Oral PRN Kennedy Bucker, MD       Or  . phenol (CHLORASEPTIC) mouth spray 1 spray  1 spray Mouth/Throat PRN Kennedy Bucker, MD      . methocarbamol (ROBAXIN) tablet 500 mg  500 mg Oral Q6H PRN Kennedy Bucker, MD       Or  . methocarbamol (ROBAXIN) 500 mg in dextrose 5 % 50 mL IVPB  500 mg Intravenous Q6H PRN Kennedy Bucker, MD      . metoCLOPramide (REGLAN) tablet 5-10 mg  5-10 mg Oral Q8H PRN Kennedy Bucker, MD       Or  . metoCLOPramide (REGLAN) injection 5-10 mg  5-10 mg Intravenous Q8H PRN  Kennedy Bucker, MD      . metoprolol tartrate (LOPRESSOR) tablet 25 mg  25 mg Oral BID Kennedy Bucker, MD      . Melene Muller ON 11/07/2015] montelukast (SINGULAIR) tablet 10 mg  10 mg Oral q morning - 10a Kennedy Bucker, MD      . morphine 2 MG/ML injection 2 mg  2 mg Intravenous Q1H PRN Kennedy Bucker, MD   2 mg at 11/06/15 1521  . ondansetron (ZOFRAN) tablet 4 mg  4 mg Oral Q6H PRN Kennedy Bucker, MD       Or  . ondansetron [facility]) injection 4 mg  4 mg Intravenous Q6H PRN Kennedy Bucker, MD   4 mg at 11/06/15 1521  . scopolamine (TRANSDERM-SCOP) 1 MG/3DAYS 1.5 mg  1 patch Transdermal Q72H Kennedy Bucker, MD      . traZODone (DESYREL) tablet 50 mg  50 mg Oral QHS Kennedy Bucker, MD      . vitamin B-12 (CYANOCOBALAMIN) tablet 500 mcg  500 mcg Oral Daily Kennedy Bucker, MD      . zolpidem [facility]) tablet 5 mg  5 mg Oral QHS PRN Kennedy Bucker, MD         Discharge Medications: Please see discharge summary for a list of discharge medications.  Relevant Imaging Results:  Relevant Lab Results:   Additional Information  (SSN: [id number])  Haig Prophet, LCSW

## 2015-11-06 NOTE — Evaluation (Signed)
Physical Therapy Evaluation Patient Details Name: Claire Strong MRN: [id number] DOB: [birth year] Today's Date: 11/06/2015   History of Present Illness  Pt underwent R THR anterior approach without reported post-op complications.   Clinical Impression  Pt requires minimal assistance for bed mobility but CGA only for transfers and limited ambulation bed to recliner. She demonstrates good stability in standing but is primarily limited by high levels of pain currently. Pt should be appropriate to return home with [facility] PT at discharge. Pt will benefit from skilled PT services to address deficits in strength, balance, and mobility in order to return to full function at home.     Follow Up Recommendations Home health PT    Equipment Recommendations  None recommended by PT    Recommendations for Other Services       Precautions / Restrictions Precautions Precautions: Anterior Hip Precaution Booklet Issued: Yes (comment) Restrictions Weight Bearing Restrictions: Yes RLE Weight Bearing: Weight bearing as tolerated      Mobility  Bed Mobility Overal bed mobility: Needs Assistance Bed Mobility: Supine to Sit     Supine to sit: Min assist     General bed mobility comments: Cues for proper technique. Use of bed rails and UE assist as wellas assist to adduct RLE  Transfers Overall transfer level: Needs assistance Equipment used: Rolling walker (2 wheeled) Transfers: Sit to/from Stand Sit to Stand: Min guard         General transfer comment: Pt demonstrates decreased weight shift to RLE during transfer. Cues for safe hand placement. Steady in standing with UE support  Ambulation/Gait Ambulation/Gait assistance: Min guard Ambulation Distance (Feet): 3 Feet Assistive device: Rolling walker (2 wheeled) Gait Pattern/deviations: Step-to pattern Gait velocity: Decreased Gait velocity interpretation: Below normal speed for age/gender General Gait Details: Pt able to take small steps  from bed to recliner with cues for proper sequencign with walker  Stairs            Wheelchair Mobility    Modified Rankin (Stroke Patients Only)       Balance Overall balance assessment: Needs assistance Sitting-balance support: No upper extremity supported Sitting balance-Leahy Scale: Good     Standing balance support: No upper extremity supported Standing balance-Leahy Scale: Fair                               Pertinent Vitals/Pain Pain Assessment: 0-10 Pain Score: 10-Worst pain ever Pain Location: R hip Pain Descriptors / Indicators: Spasm Pain Intervention(s): Limited activity within patient's tolerance;Monitored during session;Patient requesting pain meds-RN notified    Home Living Family/patient expects to be discharged to:: Private residence Living Arrangements: Spouse/significant other Available Help at Discharge: Family Type of Home: House Home Access: Stairs to enter Entrance Stairs-Rails: Psychiatric nurse of Steps: 4 Home Layout: One level Home Equipment: Shower seat;Cane - single point;Walker - 4 wheels;Grab bars - tub/shower;Walker - 2 wheels      Prior Function Level of Independence: Needs assistance   Gait / Transfers Assistance Needed: Limited community ambulator without assistive device. Assist with IADLs  ADL's / Homemaking Assistance Needed: Independent with ADLs, assist with IADLs        Hand Dominance   Dominant Hand: Right    Extremity/Trunk Assessment   Upper Extremity Assessment: Overall WFL for tasks assessed           Lower Extremity Assessment: RLE deficits/detail RLE Deficits / Details: Requires assist for SLR. Full DF/PF RLE.  LLE grossly WFL       Communication   Communication: No difficulties  Cognition Arousal/Alertness: Awake/alert Behavior During Therapy: WFL for tasks assessed/performed Overall Cognitive Status: Within Functional Limits for tasks assessed                       General Comments      Exercises Total Joint Exercises Ankle Circles/Pumps: Strengthening;Both;10 reps;Supine Quad Sets: Strengthening;Both;10 reps;Supine Gluteal Sets: Strengthening;Both;10 reps;Supine Heel Slides: Strengthening;Right;10 reps;Supine Hip ABduction/ADduction: Strengthening;Right;10 reps;Supine Straight Leg Raises: Strengthening;Right;10 reps;Supine      Assessment/Plan    PT Assessment Patient needs continued PT services  PT Diagnosis Difficulty walking;Abnormality of gait;Generalized weakness;Acute pain   PT Problem List Decreased strength;Decreased range of motion;Decreased balance;Decreased activity tolerance;Decreased mobility;Decreased knowledge of use of DME;Pain;Obesity  PT Treatment Interventions DME instruction;Gait training;Stair training;Functional mobility training;Therapeutic activities;Therapeutic exercise;Balance training;Neuromuscular re-education;Patient/family education;Manual techniques   PT Goals (Current goals can be found in the Care Plan section) Acute Rehab PT Goals Patient Stated Goal: Improve function at home with less pain PT Goal Formulation: With patient/family Time For Goal Achievement: 11/20/15 Potential to Achieve Goals: Good    Frequency BID   Barriers to discharge        Co-evaluation               End of Session Equipment Utilized During Treatment: Gait belt Activity Tolerance: Patient tolerated treatment well Patient left: in chair;with call bell/phone within reach;with chair alarm set;with nursing/sitter in room;with SCD's reapplied Nurse Communication: Mobility status;Other (comment) (Present for session)         Time: 1640-1705 PT Time Calculation (min) (ACUTE ONLY): 25 min   Charges:   PT Evaluation $PT Eval Low Complexity: 1 Procedure PT Treatments $Therapeutic Exercise: 8-22 mins   PT G Codes:       Sharalyn Ink Huprich PT, DPT   Huprich,Jason 11/06/2015, 5:19 PM

## 2015-11-06 NOTE — Op Note (Signed)
11/06/2015  12:29 PM  PATIENT:  Claire Strong  55 y.o. female  PRE-OPERATIVE DIAGNOSIS:  osteoarthritis right hip  POST-OPERATIVE DIAGNOSIS:  osteoarthritis  PROCEDURE:  Procedure(s): TOTAL HIP ARTHROPLASTY ANTERIOR APPROACH (Right)  SURGEON: Leitha Schuller, MD  ASSISTANTS: None  ANESTHESIA:   spinal  EBL:  Total I/O In: 1200 [I.V.:1200] Out: 850 [Urine:250; Blood:600]  BLOOD ADMINISTERED:none  DRAINS: (Medium) Hemovact drain(s) in the Subcutaneous layer with  Suction Open   LOCAL MEDICATIONS USED:  MARCAINE     SPECIMEN:  Source of Specimen:  Right femoral head  DISPOSITION OF SPECIMEN:  PATHOLOGY  COUNTS:  YES  TOURNIQUET:  * No tourniquets in log *  IMPLANTS: Medacta AMIS 3 standard stem with S head and 54 mm Mpact cup DM and liner  DICTATION: .Dragon Dictation  The patient was brought to the operating room and after spinal anesthesia was obtained patient was placed on the operative table with the ipsilateral foot into the Medacta attachment, contralateral leg on a well-padded table. C-arm was brought in and preop template x-ray taken. After prepping and draping in usual sterile fashion appropriate patient identification and timeout procedures were completed. Anterior approach to the hip was obtained and centered over the greater trochanter and TFL muscle. The subcutaneous tissue was incised hemostasis being achieved by electrocautery. TFL fascia was incised and the muscle retracted laterally deep retractor placed. The lateral femoral circumflex vessels were identified and ligated. The anterior capsule was exposed and a capsulotomy performed. The neck was identified and a femoral neck cut carried out with a saw. The head was removed without difficulty and showed sclerotic femoral head and acetabulum. Reaming was carried out to 54 mm and a 54 mm cup trial gave appropriate tightness to the acetabular component a 54 Mpact cup DM was impacted into position. The leg was then  externally rotated and ischiofemoral and pubofemoral releases carried out. The femur was sequentially broached to a size 3, size 3 femoral stem and standard neck, S femoral head trials were placed and the final components chosen. The 3 standard stem was inserted along with a S 28 mm head and 54 mm liner. The hip was reduced and was stable the wound was thoroughly irrigated. TXA was injected into the joint and the deep fascia closed in a running manner with heavy Quill. Next subcutaneous drains were placed and the skin was closed with subcuticular Quill and skin staples. A Provena wound VAC was applied.  PLAN OF CARE: Admit to inpatient

## 2015-11-07 LAB — CBC
HEMATOCRIT: 30 % — AB (ref 35.0–47.0)
HEMOGLOBIN: 10.1 g/dL — AB (ref 12.0–16.0)
MCH: 28.3 pg (ref 26.0–34.0)
MCHC: 33.8 g/dL (ref 32.0–36.0)
MCV: 83.7 fL (ref 80.0–100.0)
PLATELETS: 121 10*3/uL — AB (ref 150–440)
RBC: 3.58 MIL/uL — ABNORMAL LOW (ref 3.80–5.20)
RDW: 12.5 % (ref 11.5–14.5)
WBC: 4.9 10*3/uL (ref 3.6–11.0)

## 2015-11-07 LAB — BASIC METABOLIC PANEL
ANION GAP: 7 (ref 5–15)
BUN: 10 mg/dL (ref 6–20)
CALCIUM: 7.7 mg/dL — AB (ref 8.9–10.3)
CO2: 24 mmol/L (ref 22–32)
Chloride: 101 mmol/L (ref 101–111)
Creatinine, Ser: 0.82 mg/dL (ref 0.44–1.00)
GFR calc Af Amer: >60 mL/min (ref >60)
GLUCOSE: 121 mg/dL — AB (ref 65–99)
POTASSIUM: 4.2 mmol/L (ref 3.5–5.1)
Sodium: 132 mmol/L — ABNORMAL LOW (ref 135–145)

## 2015-11-07 MED ORDER — FE FUMARATE-B12-VIT C-FA-IFC PO CAPS
1.0000 | ORAL_CAPSULE | Freq: Three times a day (TID) | ORAL | Status: DC
  Administered 2015-11-07: 1 via ORAL
  Filled 2015-11-07 (×6): qty 1

## 2015-11-07 NOTE — Progress Notes (Signed)
Patient got up to use the restroom and Hemo vac came out. Dr called, no new orders given, will continue to monitor.

## 2015-11-07 NOTE — Care Management Note (Addendum)
Case Management Note  Patient Details  Name: Claire Strong MRN: [id number] Date of Birth: Aug 18, 1960  Subjective/Objective:                  Met with patient to discuss discharge planning. She states she has family/friend support at discharge. She has a bedside commode and rolling walker available for use in the home. She would like to use [facility] home health like before. She uses [facility] [phone] for Rx.  Action/Plan: List of home health agencies left with patient. Referral to Santiago Glad with [facility] home health. I have faxed home health orders also to Santiago Glad. Lovenox '40mg'$  #14 called in to [facility] $0.00. RNCM will continue to follow.   Expected Discharge Date:                  Expected Discharge Plan:     In-House Referral:     Discharge planning Services  CM Consult  Post Acute Care Choice:  Home Health Choice offered to:  Patient  DME Arranged:    DME Agency:     HH Arranged:  PT HH Agency:  [place]  Status of Service:  In process, will continue to follow  Medicare Important Message Given:    Date Medicare IM Given:    Medicare IM give by:    Date Additional Medicare IM Given:    Additional Medicare Important Message give by:     If discussed at [place] of Stay Meetings, dates discussed:    Additional Comments:  Marshell Garfinkel, RN 11/07/2015, 10:59 AM

## 2015-11-07 NOTE — Progress Notes (Signed)
Clinical Social Worker (CSW) received SNF consult. PT is recommending home health. RN Case Manager is aware of above. Please reconsult if future social work needs arise. CSW signing off.   Calia Napp Morgan, LCSW [phone] 

## 2015-11-07 NOTE — Evaluation (Signed)
Occupational Therapy Evaluation Patient Details Name: ELLORY KHURANA MRN: [id number] DOB: 1961-03-05 Today's Date: 11/07/2015    History of Present Illness Pt underwent R THR anterior approach without reported post-op complications.    Clinical Impression    Pt. Is a 55 y.o. Female who was admitted to [facility] for a right THR(Anterior approach). Pt. Presents with pain, limited ROM, weakness, limited activity tolerance, and impaired functional mobility for ADLs which hinders her ability to complete ADL and IADL tasks. Pt. could benefit from skilled OT services to review ADLs, and A/E in order to improve ADL, and IADL functioning.        Follow Up Recommendations  Home health OT    Equipment Recommendations       Recommendations for Other Services PT consult     Precautions / Restrictions Precautions Precautions: Anterior Hip Precaution Booklet Issued: Yes (comment) Restrictions Weight Bearing Restrictions: Yes RLE Weight Bearing: Weight bearing as tolerated      Mobility Bed Mobility                  Transfers Overall transfer level: Needs assistance Equipment used: Rolling walker (2 wheeled) Transfers: Sit to/from Stand Sit to Stand: Min guard              Balance             Standing balance-Leahy Scale: Fair                              ADL Overall ADL's : Independent;Needs assistance/impaired Eating/Feeding: Independent;Set up   Grooming: Independent;Set up           Upper Body Dressing : Min guard   Lower Body Dressing: Minimal assistance               Functional mobility during ADLs: Min guard       Vision     Perception     Praxis      Pertinent Vitals/Pain Pain Assessment: 0-10 Pain Score: 4  Pain Location: Right Hip Pain Intervention(s): Limited activity within patient's tolerance;Repositioned     Hand Dominance Right   Extremity/Trunk Assessment Upper Extremity Assessment Upper Extremity  Assessment: Overall WFL for tasks assessed   Lower Extremity Assessment Lower Extremity Assessment: RLE deficits/detail       Communication Communication Communication: No difficulties   Cognition Arousal/Alertness: Awake/alert Behavior During Therapy: WFL for tasks assessed/performed Overall Cognitive Status: Within Functional Limits for tasks assessed                     General Comments       Exercises       Shoulder Instructions      Home Living Family/patient expects to be discharged to:: Private residence Living Arrangements: Spouse/significant other Available Help at Discharge: Family Type of Home: House Home Access: Stairs to enter Secretary/administrator of Steps: 4 Entrance Stairs-Rails: Right;Left Home Layout: One level     Bathroom Shower/Tub: Walk-in Pensions consultant: Handicapped height     Home Equipment: Shower seat;Cane - single point;Walker - 4 wheels;Grab bars - tub/shower;Walker - 2 wheels;Shower seat - built in          Prior Functioning/Environment Level of Independence: Independent with assistive device(s)        Comments: Driving. Has not been working since she was laid off. Reports applying for disability at least 3 times.    OT Diagnosis:  Generalized weakness;Acute pain   OT Problem List: Decreased strength;Pain;Decreased knowledge of use of DME or AE;Decreased activity tolerance   OT Treatment/Interventions: Self-care/ADL training;Therapeutic activities;Therapeutic exercise;DME and/or AE instruction;Patient/family education    OT Goals(Current goals can be found in the care plan section) Acute Rehab OT Goals Patient Stated Goal: To improve independence with ADLs OT Goal Formulation: With patient Time For Goal Achievement: 11/21/15 Potential to Achieve Goals: Good  OT Frequency: Min 1X/week   Barriers to D/C:            Co-evaluation              End of Session    Activity Tolerance:  Patient tolerated treatment well Patient left: in chair;with call bell/phone within reach;with chair alarm set   Time: 4652-0761 OT Time Calculation (min): 30 min Charges:  OT General Charges $OT Visit: 1 Procedure OT Evaluation $OT Eval Moderate Complexity: 1 Procedure OT Treatments $Self Care/Home Management : 8-22 mins G-Codes:    Olegario Messier, MS, OTR/L Olegario Messier 11/07/2015, 10:10 AM

## 2015-11-07 NOTE — Progress Notes (Addendum)
Subjective: 1 Day Post-Op Procedure(s) (LRB): TOTAL HIP ARTHROPLASTY ANTERIOR APPROACH (Right) Patient reports pain as moderate.   Patient is well, and has had no acute complaints or problems Denies any CP, SOB, ABD pain. We will continue therapy today.  Plan is to go Home after hospital stay.  Objective: Vital signs in last 24 hours: Temp:  [97.4 F (36.3 C)-98.5 F (36.9 C)] 98.5 F (36.9 C) (05/24 0423) Pulse Rate:  [47-92] 57 (05/24 0423) Resp:  [13-19] 18 (05/23 1847) BP: (101-159)/(58-94) 109/71 mmHg (05/24 0423) SpO2:  [97 %-100 %] 100 % (05/24 0423) Weight:  [106.142 kg (234 lb)] 106.142 kg (234 lb) (05/23 0850)  Intake/Output from previous day: 05/23 0701 - 05/24 0700 In: 3316.7 [I.V.:3016.7; IV Piggyback:300] Out: 2470 [Urine:1680; Drains:190; Blood:600] Intake/Output this shift:     Recent Labs  11/06/15 1428 11/07/15 0352  HGB 12.9 10.1*    Recent Labs  11/06/15 1428 11/07/15 0352  WBC 6.1 4.9  RBC 4.47 3.58*  HCT 38.2 30.0*  PLT 145* 121*    Recent Labs  11/06/15 1428 11/07/15 0352  NA  --  132*  K  --  4.2  CL  --  101  CO2  --  24  BUN  --  10  CREATININE 0.84 0.82  GLUCOSE  --  121*  CALCIUM  --  7.7*   No results for input(s): LABPT, INR in the last 72 hours.  EXAM General - Patient is Alert, Appropriate and Oriented Extremity - Neurovascular intact Sensation intact distally Intact pulses distally Dorsiflexion/Plantar flexion intact No cellulitis present Dressing - dressing C/D/I and no drainage. Hemovac and woundvac intact Motor Function - intact, moving foot and toes well on exam.   Past Medical History  Diagnosis Date  . Lupus anticoagulant disorder (HCC)   . Osteoarthritis     ?FM by rheum  . Depression   . HTN (hypertension)   . 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 Jacqulyn Ducking  . History of gastric ulcer 2009  . History of DVT (deep vein thrombosis)     DVTs after 1st pregnancy, not on AC 2/2 bleeding ulcer,  greenfield filter in place  . Nasal septal perforation     chronic, ENT rec avoid antihistamine, INS  . Seasonal allergies     allergy shots, singulair  . Multinodular goiter     s/p thyroidectomy  . Bronchial asthma   . Post-surgical hypothyroidism     for multinodular goiter  . Diverticulosis     mild by colonoscopy  . 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 Trula Ore ortho)  . Chronic venous insufficiency 2007    s/p vein stripping and laser ablation  . Fibromyalgia 02/2014    [facility]  . Cancer (HCC)     melanoma  . Bleeding disorder (HCC)   . Hypothyroidism   . Overweight   . Kidney stones     s/p renal hematoma after lithotripsy on right  . Horseshoe kidney     sole, R kidney damage  . Sight deterioration     disc in back  . Tachycardia     Assessment/Plan:   1 Day Post-Op Procedure(s) (LRB): TOTAL HIP ARTHROPLASTY ANTERIOR APPROACH (Right) Active Problems:   Primary osteoarthritis of right hip   Acute post op blood loss anemia   Estimated body mass index is 34.54 kg/(m^2) as calculated from the following:   Height as of this encounter: 5\' 9"  (  1.753 m).   Weight as of this encounter: 106.142 kg (234 lb). Advance diet Up with therapy  Recheck labs in the am Needs BM  DVT Prophylaxis - Lovenox, Foot Pumps and TED hose Weight-Bearing as tolerated to right leg D/C O2 and Pulse OX and try on Room Air  T. Cranston Neighbor, PA-C [facility] 11/07/2015, 8:09 AM

## 2015-11-07 NOTE — Progress Notes (Signed)
Physical Therapy Treatment Patient Details Name: Claire Strong MRN: [id number] DOB: Nov 13, 1960 Today's Date: 11/07/2015    History of Present Illness Pt underwent R THR anterior approach without reported post-op complications.     PT Comments    Pt demonstrates improvement in gait distance and quality. She is able to increase her L step length and improve stance time on RLE. Pt able to decrease her UE reliance during gait. She is able to complete all seated exercises as instructed but requires assist with R hip flexion due to weakness. Will progress ambulation distance this afternoon and attempt stairs if appropriate. Pt will benefit from skilled PT services to address deficits in strength, balance, and mobility in order to return to full function at home.    Follow Up Recommendations  Home health PT     Equipment Recommendations  None recommended by PT    Recommendations for Other Services       Precautions / Restrictions Precautions Precautions: Anterior Hip Precaution Booklet Issued: Yes (comment) Restrictions Weight Bearing Restrictions: Yes RLE Weight Bearing: Weight bearing as tolerated    Mobility  Bed Mobility               General bed mobility comments: Received and left up in recliner  Transfers Overall transfer level: Needs assistance Equipment used: Rolling walker (2 wheeled) Transfers: Sit to/from Stand Sit to Stand: Min guard         General transfer comment: Pt with improving hand placement and weight acceptance to RLE during transfer. Improving speed and LE strength noted  Ambulation/Gait Ambulation/Gait assistance: Min guard Ambulation Distance (Feet): 100 Feet Assistive device: Rolling walker (2 wheeled) Gait Pattern/deviations: Step-to pattern Gait velocity: Decreased Gait velocity interpretation: Below normal speed for age/gender General Gait Details: Pt provided cues for increased step length with LLE and decreased UE reliance. She  demonstrates almost symmetrical stance time on RLE and LLE. Pt able to ambulate to RN station and back to recliner. Denies DOE and reports decreasing pain with movement   Stairs            Wheelchair Mobility    Modified Rankin (Stroke Patients Only)       Balance Overall balance assessment: Needs assistance Sitting-balance support: No upper extremity supported Sitting balance-Leahy Scale: Good     Standing balance support: No upper extremity supported Standing balance-Leahy Scale: Fair                      Cognition Arousal/Alertness: Awake/alert Behavior During Therapy: WFL for tasks assessed/performed Overall Cognitive Status: Within Functional Limits for tasks assessed                      Exercises Total Joint Exercises Hip ABduction/ADduction: Strengthening;Right;10 reps;Seated Long Arc Quad: Strengthening;Right;10 reps;Seated Knee Flexion: Strengthening;Right;10 reps;Seated Marching in Standing: Strengthening;Both;10 reps;Seated    General Comments        Pertinent Vitals/Pain Pain Assessment: 0-10 Pain Score: 4  Pain Location: right hip Pain Descriptors / Indicators: Other (Comment) ("Muscle pain") Pain Intervention(s): Monitored during session;Limited activity within patient's tolerance;Other (comment) (Pt refuses pain meds from RN)    Home Living Family/patient expects to be discharged to:: Private residence Living Arrangements: Spouse/significant other Available Help at Discharge: Family Type of Home: House Home Access: Stairs to enter Entrance Stairs-Rails: Right;Left Home Layout: One level Home Equipment: Shower seat;Cane - single point;Walker - 4 wheels;Grab bars - tub/shower;Walker - 2 wheels;Shower seat - built in  Prior Function Level of Independence: Independent with assistive device(s)      Comments: Driving. Has not been working since she was laid off. Reports applying for disability at least 3 times.   PT  Goals (current goals can now be found in the care plan section) Acute Rehab PT Goals Patient Stated Goal: To improve independence with ADLs PT Goal Formulation: With patient/family Time For Goal Achievement: 11/20/15 Potential to Achieve Goals: Good Progress towards PT goals: Progressing toward goals    Frequency  BID    PT Plan Current plan remains appropriate    Co-evaluation             End of Session Equipment Utilized During Treatment: Gait belt Activity Tolerance: Patient tolerated treatment well Patient left: in chair;with call bell/phone within reach;with nursing/sitter in room;Other (comment) (ice pack on hip, declines alarm, agrees to call RN )     Time: 1740-9927 PT Time Calculation (min) (ACUTE ONLY): 23 min  Charges:  $Gait Training: 8-22 mins $Therapeutic Exercise: 8-22 mins                    G Codes:      Sharalyn Ink Huprich PT, DPT   Huprich,Jason 11/07/2015, 10:24 AM

## 2015-11-07 NOTE — Progress Notes (Signed)
Physical Therapy Treatment Patient Details Name: Claire Strong MRN: [id number] DOB: [birth year] Today's Date: 11/07/2015    History of Present Illness Pt underwent R THR anterior approach without reported post-op complications.     PT Comments    Pt demonstrates improved gait distance and speed this afternoon. She is able to increase L step length with cues and improve upright posture. Pt also demonstrates improved standing balance and ability to progress exercises. She continues to demonstrate R hip flexion weakness but improved from prior sessions. Pt will benefit from skilled PT services to address deficits in strength, balance, and mobility in order to return to full function at home.    Follow Up Recommendations  Home health PT     Equipment Recommendations  None recommended by PT    Recommendations for Other Services       Precautions / Restrictions Precautions Precautions: Anterior Hip Precaution Booklet Issued: Yes (comment) Restrictions Weight Bearing Restrictions: Yes RLE Weight Bearing: Weight bearing as tolerated    Mobility  Bed Mobility               General bed mobility comments: Received upright at EOB and left upright in recliner  Transfers Overall transfer level: Needs assistance Equipment used: Rolling walker (2 wheeled) Transfers: Sit to/from Stand Sit to Stand: Min guard         General transfer comment: Pt demonstrates increased speed and sequencing with sit to stand transfers. Able to stand upright without UE support. Improving weight shifting to RLE during transfer.  Ambulation/Gait Ambulation/Gait assistance: Min guard Ambulation Distance (Feet): 250 Feet Assistive device: Rolling walker (2 wheeled) Gait Pattern/deviations: Step-through pattern Gait velocity: Decreased Gait velocity interpretation: Below normal speed for age/gender General Gait Details: Pt able to perform a full lap around RN station with rolling walker. Improving L  step length and gait speed as distance increases. Pt reports increase in R hip pain during ambulation. Vitals monitored during ambulation and remain WNL. Cues for improved upright posture and increased L step length   Stairs            Wheelchair Mobility    Modified Rankin (Stroke Patients Only)       Balance Overall balance assessment: Needs assistance Sitting-balance support: No upper extremity supported Sitting balance-Leahy Scale: Good     Standing balance support: No upper extremity supported Standing balance-Leahy Scale: Fair                      Cognition Arousal/Alertness: Awake/alert Behavior During Therapy: WFL for tasks assessed/performed Overall Cognitive Status: Within Functional Limits for tasks assessed                      Exercises Total Joint Exercises Hip ABduction/ADduction: Strengthening;10 reps;Seated;Both Long Arc Quad: Strengthening;Right;10 reps;Seated Knee Flexion: Strengthening;Right;10 reps;Seated Marching in Standing: Strengthening;Both;10 reps;Seated Other Exercises Other Exercises: Standing marches x 10, standing mini squats x 10    General Comments        Pertinent Vitals/Pain Pain Assessment: 0-10 Pain Score: 3  Pain Location: R hip Pain Intervention(s): Monitored during session    Home Living                      Prior Function            PT Goals (current goals can now be found in the care plan section) Acute Rehab PT Goals Patient Stated Goal: To improve independence with ADLs PT  Goal Formulation: With patient/family Time For Goal Achievement: 11/20/15 Potential to Achieve Goals: Good Progress towards PT goals: Progressing toward goals    Frequency  BID    PT Plan Current plan remains appropriate    Co-evaluation             End of Session Equipment Utilized During Treatment: Gait belt Activity Tolerance: Patient tolerated treatment well Patient left: Other (comment);in  chair;with call bell/phone within reach (ice pack on hip, declines alarm, agrees to call RN )     Time: 2376-2831 PT Time Calculation (min) (ACUTE ONLY): 25 min  Charges:  $Gait Training: 8-22 mins $Therapeutic Exercise: 8-22 mins                    G Codes:      Sharalyn Ink Jacey Eckerson PT, DPT   Tushar Enns 11/07/2015, 3:57 PM

## 2015-11-08 LAB — BASIC METABOLIC PANEL
Anion gap: 6 (ref 5–15)
BUN: 9 mg/dL (ref 6–20)
CALCIUM: 7.9 mg/dL — AB (ref 8.9–10.3)
CO2: 26 mmol/L (ref 22–32)
Chloride: 98 mmol/L — ABNORMAL LOW (ref 101–111)
Creatinine, Ser: 0.78 mg/dL (ref 0.44–1.00)
GFR calc Af Amer: >60 mL/min (ref >60)
GLUCOSE: 137 mg/dL — AB (ref 65–99)
Potassium: 3.7 mmol/L (ref 3.5–5.1)
SODIUM: 130 mmol/L — AB (ref 135–145)

## 2015-11-08 LAB — CBC
HCT: 30.8 % — ABNORMAL LOW (ref 35.0–47.0)
Hemoglobin: 10.5 g/dL — ABNORMAL LOW (ref 12.0–16.0)
MCH: 28.8 pg (ref 26.0–34.0)
MCHC: 34.1 g/dL (ref 32.0–36.0)
MCV: 84.5 fL (ref 80.0–100.0)
PLATELETS: 128 10*3/uL — AB (ref 150–440)
RBC: 3.65 MIL/uL — ABNORMAL LOW (ref 3.80–5.20)
RDW: 12.5 % (ref 11.5–14.5)
WBC: 6 10*3/uL (ref 3.6–11.0)

## 2015-11-08 LAB — SURGICAL PATHOLOGY

## 2015-11-08 MED ORDER — ENOXAPARIN SODIUM 40 MG/0.4ML ~~LOC~~ SOLN: 40.0000 mg | SUBCUTANEOUS | Status: DC

## 2015-11-08 MED ORDER — ONDANSETRON HCL 4 MG PO TABS: 4.0000 mg | ORAL_TABLET | Freq: Four times a day (QID) | ORAL | Status: DC | PRN

## 2015-11-08 MED ORDER — BISACODYL 10 MG RE SUPP
10.0000 mg | Freq: Every day | RECTAL | Status: DC | PRN
  Filled 2015-11-08: qty 1

## 2015-11-08 MED ORDER — HYDROCODONE-ACETAMINOPHEN 7.5-325 MG PO TABS: 1.0000 | ORAL_TABLET | ORAL | Status: DC | PRN

## 2015-11-08 NOTE — Discharge Instructions (Signed)
Diet: As you were doing prior to hospitalization   Shower:  May shower but keep the wounds dry, use an occlusive plastic wrap, NO SOAKING IN TUB.  If the bandage gets wet, change with a clean dry gauze.  Dressing:  Once battery dies on wound vac, remove wound vac and apply dressing to incision site. Keep dressing applied and keep clean and dry.    Activity:  Increase activity slowly as tolerated, but follow the weight bearing instructions below.  No lifting or driving for 6 weeks.  Weight Bearing:   Weight bearing as tolerated to right lower extremity  To prevent constipation: you may use a stool softener such as -  Colace (over the counter) 100 mg by mouth twice a day  Drink plenty of fluids (prune juice may be helpful) and high fiber foods Miralax (over the counter) for constipation as needed.    Itching:  If you experience itching with your medications, try taking only a single pain pill, or even half a pain pill at a time.  You may take up to 10 pain pills per day, and you can also use benadryl over the counter for itching or also to help with sleep.   Precautions:  If you experience chest pain or shortness of breath - call 911 immediately for transfer to the hospital emergency department!!  If you develop a fever greater that 101 F, purulent drainage from wound, increased redness or drainage from wound, or calf pain-Call [facility]                                              Follow- Up Appointment:  Please call for an appointment to be seen in 2 weeks at [facility]

## 2015-11-08 NOTE — Progress Notes (Signed)
Physical Therapy Treatment Patient Details Name: Claire Strong MRN: [id number] DOB: [birth year] Today's Date: 11/08/2015    History of Present Illness Pt underwent R THR anterior approach without reported post-op complications.     PT Comments    Pt continues to demonstrate excellent progress with physical therapy on this date. She is able to complete a full lap around RN station as well as safely ascend/descend 4 stairs. Pt demonstrates improving bilateral step length, gait speed, and upright posture. She is able to decrease UE support and is able to take some steps with hand held support only. Pt has met all PT goals for discharge and will be safe to return home with husband and [facility] PT when medically indicated. Per Dr. Rudene Christians no further PT required prior to discharge. Pt will benefit from skilled PT services to address deficits in strength, balance, and mobility in order to return to full function at home.    Follow Up Recommendations  Home health PT     Equipment Recommendations  None recommended by PT    Recommendations for Other Services       Precautions / Restrictions Precautions Precautions: Anterior Hip Precaution Booklet Issued: Yes (comment) Restrictions Weight Bearing Restrictions: Yes RLE Weight Bearing: Weight bearing as tolerated    Mobility  Bed Mobility Overal bed mobility: Needs Assistance Bed Mobility: Supine to Sit;Sit to Supine     Supine to sit: Min guard Sit to supine: Min assist   General bed mobility comments: Pt demonstrates slow transfer from supine to sit but able to perform without assistance for supine to sit portion. When moving from sit to supine pt requiring assist with RLE due to weak R hip flexion. Cues provided for using LLE to assist in returning to bed but pt still requiring assist from therapist to help get legs back in bed  Transfers Overall transfer level: Needs assistance Equipment used: Rolling walker (2 wheeled) Transfers: Sit  to/from Stand Sit to Stand: Min guard         General transfer comment: Pt demonstrates increased speed and sequencing with sit to stand transfers. Able to stand upright without UE support. Improving weight shifting to RLE during transfer. Practiced toilet transfers in bathroom with patient which she is able to perform safely with supervision only  Ambulation/Gait Ambulation/Gait assistance: Min guard Ambulation Distance (Feet): 300 Feet Assistive device: Rolling walker (2 wheeled)   Gait velocity: Decreased   General Gait Details: Pt able to ambulate from room to rehab gym and then complete a full lap around RN station. Cues provided for increased L step length, decreased UE support, and improved upright posture. Denies increase in pain with ambulation. Denies DOE. At the end of walking distance pt able to ambulate approximately 30' with HHA  LUE only   Stairs Stairs: Yes Stairs assistance: Min guard Stair Management: Two rails;Step to pattern Number of Stairs: 4 General stair comments: Pt educated about proper sequencing with stairs. She demonstrates good strength during ascend. She struggles to flex R hip during descent but is able to slide foot off of step. Educated pt regarding use of unilateral railing when returning home  Wheelchair Mobility    Modified Rankin (Stroke Patients Only)       Balance Overall balance assessment: Needs assistance Sitting-balance support: No upper extremity supported Sitting balance-Leahy Scale: Good     Standing balance support: No upper extremity supported Standing balance-Leahy Scale: Fair  Cognition Arousal/Alertness: Awake/alert Behavior During Therapy: WFL for tasks assessed/performed Overall Cognitive Status: Within Functional Limits for tasks assessed                      Exercises Total Joint Exercises Hip ABduction/ADduction: Strengthening;Seated;Both;15 reps Straight Leg Raises:  Strengthening;Both;10 reps;Standing Long Arc Quad: Strengthening;Right;Seated;15 reps Knee Flexion: Strengthening;Right;Seated;15 reps Marching in Standing: Strengthening;Both;10 reps;Seated Other Exercises Other Exercises: Standing mini squats x 10, standing R hip abduction x 10    General Comments        Pertinent Vitals/Pain Pain Assessment: No/denies pain    Home Living                      Prior Function            PT Goals (current goals can now be found in the care plan section) Acute Rehab PT Goals Patient Stated Goal: To improve independence with ADLs PT Goal Formulation: With patient/family Time For Goal Achievement: 11/20/15 Potential to Achieve Goals: Good Progress towards PT goals: Progressing toward goals    Frequency  BID    PT Plan Current plan remains appropriate    Co-evaluation             End of Session Equipment Utilized During Treatment: Gait belt Activity Tolerance: Patient tolerated treatment well Patient left: Other (comment);with call bell/phone within reach;in bed;with bed alarm set (ice pack on hip)     Time: 3435-6861 PT Time Calculation (min) (ACUTE ONLY): 42 min  Charges:  $Gait Training: 23-37 mins $Therapeutic Exercise: 8-22 mins                    G Codes:      Lyndel Safe Yoshiye Kraft PT, DPT   Minahil Quinlivan 11/08/2015, 9:11 AM

## 2015-11-08 NOTE — Progress Notes (Signed)
Subjective: 2 Days Post-Op Procedure(s) (LRB): TOTAL HIP ARTHROPLASTY ANTERIOR APPROACH (Right) Patient reports pain as moderate.   Patient is well, and has had no acute complaints or problems Denies any CP, SOB, ABD pain. We will continue therapy today.  Plan is to go Home after hospital stay.  Objective: Vital signs in last 24 hours: Temp:  [97.7 F (36.5 C)-99.2 F (37.3 C)] 99.1 F (37.3 C) (05/25 0410) Pulse Rate:  [56-65] 59 (05/25 0410) Resp:  [18-19] 19 (05/25 0410) BP: (120-140)/(62-88) 124/75 mmHg (05/25 0410) SpO2:  [93 %-100 %] 99 % (05/25 0410)  Intake/Output from previous day: 05/24 0701 - 05/25 0700 In: 720 [P.O.:720] Out: -  Intake/Output this shift:     Recent Labs  11/06/15 1428 11/07/15 0352 11/08/15 0442  HGB 12.9 10.1* 10.5*    Recent Labs  11/07/15 0352 11/08/15 0442  WBC 4.9 6.0  RBC 3.58* 3.65*  HCT 30.0* 30.8*  PLT 121* 128*    Recent Labs  11/07/15 0352 11/08/15 0442  NA 132* 130*  K 4.2 3.7  CL 101 98*  CO2 24 26  BUN 10 9  CREATININE 0.82 0.78  GLUCOSE 121* 137*  CALCIUM 7.7* 7.9*   No results for input(s): LABPT, INR in the last 72 hours.  EXAM General - Patient is Alert, Appropriate and Oriented Extremity - Neurovascular intact Sensation intact distally Intact pulses distally Dorsiflexion/Plantar flexion intact No cellulitis present Dressing - dressing C/D/I and no drainage. woundvac intact Motor Function - intact, moving foot and toes well on exam.   Past Medical History  Diagnosis Date  . Lupus anticoagulant disorder ([place])   . Osteoarthritis     ?FM by rheum  . Depression   . HTN (hypertension)   . 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 Frutoso Chase  . History of gastric ulcer 2009  . History of DVT (deep vein thrombosis)     DVTs after 1st pregnancy, not on AC 2/2 bleeding ulcer, greenfield filter in place  . Nasal septal perforation     chronic, ENT rec avoid antihistamine, INS  . Seasonal allergies     allergy shots, singulair  . Multinodular goiter     s/p thyroidectomy  . Bronchial asthma   . Post-surgical hypothyroidism     for multinodular goiter  . Diverticulosis     mild by colonoscopy  . 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 Gigi Gin ortho)  . Chronic venous insufficiency 2007    s/p vein stripping and laser ablation  . Fibromyalgia 02/2014    [place]  . Cancer ([place])     melanoma  . Bleeding disorder ([place])   . Hypothyroidism   . Overweight   . Kidney stones     s/p renal hematoma after lithotripsy on right  . Horseshoe kidney     sole, R kidney damage  . Sight deterioration     disc in back  . Tachycardia     Assessment/Plan:   2 Days Post-Op Procedure(s) (LRB): TOTAL HIP ARTHROPLASTY ANTERIOR APPROACH (Right) Active Problems:   Primary osteoarthritis of right hip   Acute post op blood loss anemia    Hyponatremia  Estimated body mass index is 34.54 kg/(m^2) as calculated from the following:   Height as of this encounter: 5\' 9"  (1.753 m).   Weight as of this encounter: 106.142 kg (234 lb). Advance diet Up with therapy  Needs BM Discussed fluid restrictions. Patient consuming large  amounts of H20, will decreased to daily Discharge home with HHPT today pending BM   DVT Prophylaxis - Lovenox, Foot Pumps and TED hose Weight-Bearing as tolerated to right leg D/C O2 and Pulse OX and try on Room Air  T. Cranston Neighbor, PA-C [facility] 11/08/2015, 8:03 AM

## 2015-11-08 NOTE — Progress Notes (Signed)
DISCHARGE NOTE:  Pt given discharge instructions and prescriptions. Pt verbalized understanding. Pt waiting on ride. 

## 2015-11-08 NOTE — Care Management (Signed)
Patient discharging home today. I have notified Claire Strong with [facility] of patient discharge. Patient states she does not have a bedside commode now and requests one. Rx in. Referral to [facility] bedside [facility] and it has been delivered. No further RNCM needs. Case closed.

## 2015-11-08 NOTE — Anesthesia Postprocedure Evaluation (Signed)
Anesthesia Post Note  Patient: Claire Strong  Procedure(s) Performed: Procedure(s) (LRB): TOTAL HIP ARTHROPLASTY ANTERIOR APPROACH (Right)  Patient location during evaluation: PACU Anesthesia Type: General Level of consciousness: awake and alert Pain management: pain level controlled Vital Signs Assessment: post-procedure vital signs reviewed and stable Respiratory status: spontaneous breathing, nonlabored ventilation, respiratory function stable and patient connected to nasal cannula oxygen Cardiovascular status: blood pressure returned to baseline and stable Postop Assessment: no signs of nausea or vomiting Anesthetic complications: no    Last Vitals:  Filed Vitals:   11/08/15 0410 11/08/15 0805  BP: 124/75 118/78  Pulse: 59 62  Temp: 37.3 C 37.3 C  Resp: 19 16    Last Pain:  Filed Vitals:   11/08/15 0917  PainSc: 4                  Yevette Edwards

## 2015-11-08 NOTE — Discharge Summary (Signed)
Physician Discharge Summary  Patient ID: TORIA MONTE MRN: [id number] DOB/AGE: 08/19/1960 55 y.o.  Admit date: 11/06/2015 Discharge date: 11/08/2015  Admission Diagnoses:  osteoarthritis   Discharge Diagnoses: Patient Active Problem List   Diagnosis Date Noted  . Primary osteoarthritis of right hip 11/06/2015  . History of kidney stones 09/03/2015  . Difficulty urinating 09/03/2015  . Osteoarthritis   . Right sided abdominal pain 05/10/2014  . Fibromyalgia 02/14/2014  . Atrial tachycardia (HCC) 01/25/2013  . Hypothyroidism, postsurgical 02/25/2012  . Edema 01/28/2012  . Headache(784.0) 09/25/2011  . Bronchial asthma   . Healthcare maintenance 03/18/2011  . Hot flashes 03/18/2011  . Nasal septal perforation   . Horseshoe kidney   . HTN (hypertension)   . Depression   . Lupus anticoagulant disorder (HCC)   . 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 Lupus anticoagulant disorder (HCC)   . Osteoarthritis     ?FM by rheum  . Depression   . HTN (hypertension)   . 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 Jacqulyn Ducking  . History of gastric ulcer 2009  . History of DVT (deep vein thrombosis)     DVTs after 1st pregnancy, not on AC 2/2 bleeding ulcer, greenfield filter in place  . Nasal septal perforation     chronic, ENT rec avoid antihistamine, INS  . Seasonal allergies     allergy shots, singulair  . Multinodular goiter     s/p thyroidectomy  . Bronchial asthma   . Post-surgical hypothyroidism     for multinodular goiter  . Diverticulosis     mild by colonoscopy  . 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 Trula Ore ortho)  . Chronic venous insufficiency 2007    s/p vein stripping and laser ablation  . Fibromyalgia 02/2014    [place]  . Cancer (HCC)     melanoma  . Bleeding disorder (HCC)   . Hypothyroidism   . Overweight   . Kidney stones     s/p renal hematoma after lithotripsy on right  . Horseshoe  kidney     sole, R kidney damage  . Sight deterioration     disc in back  . Tachycardia      Transfusion: none   Consultants (if any):    Discharged Condition: Improved  Hospital Course: CRISSY MCCREADIE is an 55 y.o. female who was admitted 11/06/2015 with a diagnosis of <principal problem not specified> and went to the operating room on 11/06/2015 and underwent the above named procedures.    Surgeries: Procedure(s): TOTAL HIP ARTHROPLASTY ANTERIOR APPROACH on 11/06/2015 Patient tolerated the surgery well. Taken to PACU where she was stabilized and then transferred to the orthopedic floor.  Started on Lovenox 40 q 12 hrs. Foot pumps applied bilaterally at 80 mm. Heels elevated on bed with rolled towels. No evidence of DVT. Negative Homan. Physical therapy started on day #1 for gait training and transfer. OT started day #1 for ADL and assisted devices.  Patient's foley was d/c on day #1. Patient's IV and hemovac was d/c on day #2.  On post op day #2 patient was stable and ready for discharge to home with home health PT.  Implants: Medacta AMIS 3 standard stem with S head and 54 mm Mpact cup DM and liner  She was given perioperative antibiotics:  Anti-infectives    Start     Dose/Rate Route Frequency Ordered Stop  11/06/15 1600  ceFAZolin (ANCEF) IVPB 2g/100 mL premix     2 g 200 mL/hr over 30 Minutes Intravenous Every 6 hours 11/06/15 1353 11/07/15 0520   11/06/15 0554  ceFAZolin (ANCEF) 2-4 GM/100ML-% IVPB    Comments:  STOLLEY, LORI: cabinet override      11/06/15 0554 11/06/15 1759   11/06/15 0030  ceFAZolin (ANCEF) IVPB 2g/100 mL premix     2 g 200 mL/hr over 30 Minutes Intravenous  Once 11/06/15 0015 11/06/15 1042   11/06/15 0030  vancomycin (VANCOCIN) IVPB 1000 mg/200 mL premix     1,000 mg 200 mL/hr over 60 Minutes Intravenous  Once 11/06/15 0015 11/06/15 1006    .  She was given sequential compression devices, early ambulation, and lovenox for DVT prophylaxis.  She  benefited maximally from the hospital stay and there were no complications.    Recent vital signs:  Filed Vitals:   11/08/15 0410 11/08/15 0805  BP: 124/75 118/78  Pulse: 59 62  Temp: 99.1 F (37.3 C) 99.1 F (37.3 C)  Resp: 19 16    Recent laboratory studies:  Lab Results  Component Value Date   HGB 10.5* 11/08/2015   HGB 10.1* 11/07/2015   HGB 12.9 11/06/2015   Lab Results  Component Value Date   WBC 6.0 11/08/2015   PLT 128* 11/08/2015   Lab Results  Component Value Date   INR 1.00 10/31/2015   Lab Results  Component Value Date   NA 130* 11/08/2015   K 3.7 11/08/2015   CL 98* 11/08/2015   CO2 26 11/08/2015   BUN 9 11/08/2015   CREATININE 0.78 11/08/2015   GLUCOSE 137* 11/08/2015    Discharge Medications:     Medication List    TAKE these medications        ACETAMINOPHEN-BUTALBITAL 50-325 MG Tabs  Take 1 tablet by mouth at bedtime. Reported on 09/03/2015 3 times a week     albuterol 108 (90 Base) MCG/ACT inhaler  Commonly known as:  PROVENTIL HFA;VENTOLIN HFA  Inhale 2 puffs into the lungs every 6 (six) hours as needed.     aspirin 81 MG tablet  Take 81 mg by mouth daily.     benazepril 10 MG tablet  Commonly known as:  LOTENSIN  TAKE ONE TABLET BY MOUTH ONCE DAILY     carisoprodol 350 MG tablet  Commonly known as:  SOMA  TAKE ONE TABLET BY MOUTH AT BEDTIME     cetirizine 10 MG tablet  Commonly known as:  ZYRTEC  Take 10 mg by mouth daily.     cholecalciferol 1000 units tablet  Commonly known as:  VITAMIN D  Take 2,000 Units by mouth daily.     citalopram 40 MG tablet  Commonly known as:  CELEXA  TAKE ONE TABLET BY MOUTH ONCE DAILY     diclofenac sodium 1 % Gel  Commonly known as:  VOLTAREN  Apply 1 application topically 3 (three) times daily.     enoxaparin 40 MG/0.4ML injection  Commonly known as:  LOVENOX  Inject 0.4 mLs (40 mg total) into the skin daily.     EPINEPHrine 0.3 mg/0.3 mL Devi  Commonly known as:  EPI-PEN  Inject  0.3 mLs (0.3 mg total) into the muscle once.     Fluticasone-Salmeterol 100-50 MCG/DOSE Aepb  Commonly known as:  ADVAIR DISKUS  Inhale 1 puff into the lungs every 12 (twelve) hours.     furosemide 20 MG tablet  Commonly known as:  LASIX  TAKE ONE TABLET BY MOUTH ONCE DAILY     gabapentin 300 MG capsule  Commonly known as:  NEURONTIN  Take 300 mg by mouth 3 (three) times a week.     HYDROcodone-acetaminophen 7.5-325 MG tablet  Commonly known as:  NORCO  Take 1-2 tablets by mouth every 4 (four) hours as needed (breakthrough pain).     levothyroxine 150 MCG tablet  Commonly known as:  SYNTHROID, LEVOTHROID  Take 150 mcg by mouth daily before breakfast.     metoprolol tartrate 25 MG tablet  Commonly known as:  LOPRESSOR  Take 25 mg by mouth 2 (two) times daily.     montelukast 10 MG tablet  Commonly known as:  SINGULAIR  Take 10 mg by mouth every morning.     ondansetron 4 MG tablet  Commonly known as:  ZOFRAN  Take 4 mg by mouth every 8 (eight) hours as needed. Reported on 09/03/2015     ondansetron 4 MG tablet  Commonly known as:  ZOFRAN  Take 1 tablet (4 mg total) by mouth every 6 (six) hours as needed for nausea.     scopolamine 1 MG/3DAYS  Commonly known as:  TRANSDERM-SCOP  Place 1 patch (1.5 mg total) onto the skin every 3 (three) days.     traMADol 50 MG tablet  Commonly known as:  ULTRAM  Take 1 tablet (50 mg total) by mouth 2 (two) times daily as needed for pain.     traZODone 50 MG tablet  Commonly known as:  DESYREL  TAKE ONE-HALF TO ONE TABLET BY MOUTH AT BEDTIME AS NEEDED FOR SLEEP     vitamin B-12 500 MCG tablet  Commonly known as:  CYANOCOBALAMIN  Take 500 mcg by mouth daily.        Diagnostic Studies: Dg Hip Operative Unilat W Or W/o Pelvis Right  11/06/2015  CLINICAL DATA:  Hip replacement. EXAM: OPERATIVE RIGHT HIP (WITH PELVIS IF PERFORMED) 3 VIEWS TECHNIQUE: Fluoroscopic spot image(s) were submitted for interpretation post-operatively.  COMPARISON:  MRI 10/07/2004. FINDINGS: Total right hip replacement with good anatomic alignment. Hardware intact. Three views obtained. 0 minutes 39 seconds fluoroscopy time. IMPRESSION: Right hip replacement with good anatomic alignment. Electronically Signed   By: Maisie Fus  Register   On: 11/06/2015 13:15   Dg Hip Unilat W Or W/o Pelvis 2-3 Views Right  11/06/2015  CLINICAL DATA:  Hip replacement. EXAM: DG HIP (WITH OR WITHOUT PELVIS) 2-3V RIGHT COMPARISON:  11/06/2015.  09/03/2015. FINDINGS: Total right hip replacement with good anatomic alignment. Hardware intact. No acute bony abnormality. IMPRESSION: Total right hip replacement with good anatomic alignment. Electronically Signed   By: Maisie Fus  Register   On: 11/06/2015 13:16    Disposition:  Home with HHPT       Follow-up Information    Follow up with MENZ,MICHAEL, MD In 2 weeks.   Specialty:  Orthopedic Surgery   Why:  For staple removal and skin check   Contact information:   [address] [facility] [place] [address] [phone]        Signed: Patience Musca 11/08/2015, 8:37 AM

## 2015-11-08 NOTE — Progress Notes (Signed)
Pt wheeled to car by staff, Pt did not want to wear compression stockings, pt educated on the importance, pt verbalized understanding.

## 2015-11-09 DIAGNOSIS — Z96641 Presence of right artificial hip joint: Secondary | ICD-10-CM | POA: Diagnosis not present

## 2015-11-10 ENCOUNTER — Encounter: Payer: Self-pay | Admitting: Family Medicine

## 2015-11-13 DIAGNOSIS — Z96641 Presence of right artificial hip joint: Secondary | ICD-10-CM | POA: Diagnosis not present

## 2015-11-16 DIAGNOSIS — M4806 Spinal stenosis, lumbar region: Secondary | ICD-10-CM | POA: Diagnosis not present

## 2015-11-16 DIAGNOSIS — I129 Hypertensive chronic kidney disease with stage 1 through stage 4 chronic kidney disease, or unspecified chronic kidney disease: Secondary | ICD-10-CM | POA: Diagnosis not present

## 2015-11-16 DIAGNOSIS — Z471 Aftercare following joint replacement surgery: Secondary | ICD-10-CM | POA: Diagnosis not present

## 2015-11-16 DIAGNOSIS — M797 Fibromyalgia: Secondary | ICD-10-CM | POA: Diagnosis not present

## 2015-11-16 DIAGNOSIS — M5136 Other intervertebral disc degeneration, lumbar region: Secondary | ICD-10-CM | POA: Diagnosis not present

## 2015-11-16 DIAGNOSIS — N189 Chronic kidney disease, unspecified: Secondary | ICD-10-CM | POA: Diagnosis not present

## 2015-11-16 DIAGNOSIS — F329 Major depressive disorder, single episode, unspecified: Secondary | ICD-10-CM | POA: Diagnosis not present

## 2015-11-16 DIAGNOSIS — Z96641 Presence of right artificial hip joint: Secondary | ICD-10-CM | POA: Diagnosis not present

## 2015-11-20 DIAGNOSIS — M5136 Other intervertebral disc degeneration, lumbar region: Secondary | ICD-10-CM | POA: Diagnosis not present

## 2015-11-20 DIAGNOSIS — I129 Hypertensive chronic kidney disease with stage 1 through stage 4 chronic kidney disease, or unspecified chronic kidney disease: Secondary | ICD-10-CM | POA: Diagnosis not present

## 2015-11-20 DIAGNOSIS — F329 Major depressive disorder, single episode, unspecified: Secondary | ICD-10-CM | POA: Diagnosis not present

## 2015-11-20 DIAGNOSIS — M797 Fibromyalgia: Secondary | ICD-10-CM | POA: Diagnosis not present

## 2015-11-20 DIAGNOSIS — Z471 Aftercare following joint replacement surgery: Secondary | ICD-10-CM | POA: Diagnosis not present

## 2015-11-20 DIAGNOSIS — N189 Chronic kidney disease, unspecified: Secondary | ICD-10-CM | POA: Diagnosis not present

## 2015-11-20 DIAGNOSIS — Z96641 Presence of right artificial hip joint: Secondary | ICD-10-CM | POA: Diagnosis not present

## 2015-11-20 DIAGNOSIS — M4806 Spinal stenosis, lumbar region: Secondary | ICD-10-CM | POA: Diagnosis not present

## 2015-11-26 DIAGNOSIS — Z471 Aftercare following joint replacement surgery: Secondary | ICD-10-CM | POA: Diagnosis not present

## 2015-11-26 DIAGNOSIS — M4806 Spinal stenosis, lumbar region: Secondary | ICD-10-CM | POA: Diagnosis not present

## 2015-11-26 DIAGNOSIS — M5136 Other intervertebral disc degeneration, lumbar region: Secondary | ICD-10-CM | POA: Diagnosis not present

## 2015-11-26 DIAGNOSIS — Z96641 Presence of right artificial hip joint: Secondary | ICD-10-CM | POA: Diagnosis not present

## 2015-11-27 ENCOUNTER — Other Ambulatory Visit: Payer: Self-pay | Admitting: *Deleted

## 2015-11-27 MED ORDER — CARISOPRODOL 350 MG PO TABS: 350.0000 mg | ORAL_TABLET | Freq: Every day | ORAL | Status: DC

## 2015-11-27 NOTE — Telephone Encounter (Signed)
Ok to refill? Requests 90 day supply.

## 2015-11-27 NOTE — Telephone Encounter (Signed)
plz phone in. 

## 2015-11-27 NOTE — Telephone Encounter (Signed)
Rx called in as directed.   

## 2015-12-03 ENCOUNTER — Other Ambulatory Visit: Payer: Self-pay | Admitting: *Deleted

## 2015-12-03 NOTE — Telephone Encounter (Signed)
Ok to refill? Requests 90 day supply.

## 2015-12-04 MED ORDER — TRAZODONE HCL 50 MG PO TABS
ORAL_TABLET | ORAL | Status: DC
Start: 2015-12-04 — End: 2016-08-28

## 2015-12-11 ENCOUNTER — Telehealth: Payer: Self-pay

## 2015-12-11 DIAGNOSIS — E890 Postprocedural hypothyroidism: Secondary | ICD-10-CM

## 2015-12-11 DIAGNOSIS — D538 Other specified nutritional anemias: Secondary | ICD-10-CM

## 2015-12-11 NOTE — Telephone Encounter (Signed)
Pt left v/m; pt wants to know if could get iron shots instead of taking iron by mouth; pt has had a gastric bypass and thinks not getting full benefit of oral iron pill. Pt request cb. Last annual 03/02/15.

## 2015-12-12 NOTE — Telephone Encounter (Signed)
Last iron panel was overall ok.  How much iron is she taking daily? 1 pill daily?  rec we check iron levels next blood work and will discuss at CPE.

## 2015-12-13 ENCOUNTER — Other Ambulatory Visit (INDEPENDENT_AMBULATORY_CARE_PROVIDER_SITE_OTHER): Payer: BLUE CROSS/BLUE SHIELD

## 2015-12-13 DIAGNOSIS — D538 Other specified nutritional anemias: Secondary | ICD-10-CM | POA: Diagnosis not present

## 2015-12-13 DIAGNOSIS — E890 Postprocedural hypothyroidism: Secondary | ICD-10-CM

## 2015-12-13 NOTE — Telephone Encounter (Signed)
Spoke with pt, pt states that she's currently not taking any iron pills bc they make her "constipated". She said she's tired and feels weak all of the time. She said her insurance is good through July 1st.

## 2015-12-13 NOTE — Telephone Encounter (Signed)
Patient notified and will come in today for labs.

## 2015-12-13 NOTE — Telephone Encounter (Signed)
Can she come in today or tomorrow for iron panel? Ordered.

## 2015-12-14 LAB — VITAMIN B12: Vitamin B-12: 995 pg/mL — ABNORMAL HIGH (ref 211–911)

## 2015-12-14 LAB — IBC PANEL
Iron: 25 ug/dL — ABNORMAL LOW (ref 42–145)
SATURATION RATIOS: 5.9 % — AB (ref 20.0–50.0)
TRANSFERRIN: 301 mg/dL (ref 212.0–360.0)

## 2015-12-14 LAB — FERRITIN: FERRITIN: 15.8 ng/mL (ref 10.0–291.0)

## 2015-12-14 LAB — TSH: TSH: 1 u[IU]/mL (ref 0.35–4.50)

## 2015-12-17 ENCOUNTER — Encounter: Payer: Self-pay | Admitting: Family Medicine

## 2015-12-19 NOTE — Telephone Encounter (Signed)
Patient prefers iron infusion. Order in your IN box for review/completion. Please return to me. Thanks

## 2015-12-20 NOTE — Telephone Encounter (Signed)
Filled and in Kim's box. 

## 2015-12-20 NOTE — Telephone Encounter (Signed)
Order faxed. Patient will call to schedule her appt.

## 2015-12-21 ENCOUNTER — Telehealth: Payer: Self-pay | Admitting: Cardiovascular Disease

## 2015-12-21 NOTE — Telephone Encounter (Signed)
Received records request from [employer] , Fax to [phone] [employer] for processing  °

## 2015-12-23 ENCOUNTER — Other Ambulatory Visit: Payer: Self-pay | Admitting: Family Medicine

## 2015-12-24 DIAGNOSIS — Z96641 Presence of right artificial hip joint: Secondary | ICD-10-CM | POA: Diagnosis not present

## 2015-12-24 NOTE — Telephone Encounter (Signed)
Received refill electronically Last refill 06/13/15 #90/1 Appointment scheduled for 04/01/16 See drug warning with Tramadol and Trazadone

## 2015-12-25 ENCOUNTER — Other Ambulatory Visit (HOSPITAL_COMMUNITY): Payer: Self-pay | Admitting: *Deleted

## 2015-12-26 ENCOUNTER — Ambulatory Visit (HOSPITAL_COMMUNITY)
Admission: RE | Admit: 2015-12-26 | Discharge: 2015-12-26 | Disposition: A | Discharge status: Home / Self Care | Payer: BLUE CROSS/BLUE SHIELD | Source: Ambulatory Visit | Attending: Family Medicine | Admitting: Family Medicine

## 2015-12-26 DIAGNOSIS — D509 Iron deficiency anemia, unspecified: Secondary | ICD-10-CM | POA: Diagnosis not present

## 2015-12-26 MED ORDER — FERUMOXYTOL INJECTION 510 MG/17 ML
510.0000 mg | Freq: Once | INTRAVENOUS | Status: AC
  Administered 2015-12-26: 510 mg via INTRAVENOUS
  Filled 2015-12-26: qty 17

## 2016-02-01 ENCOUNTER — Encounter: Payer: Self-pay | Admitting: Family Medicine

## 2016-02-03 NOTE — Telephone Encounter (Signed)
plz schedule rpt iron infusion feraheme 550mg  x1. If ongoing fatigue after this, rec come in 1 mo after iron infusion for rpt labs.

## 2016-02-04 NOTE — Telephone Encounter (Signed)
Order in your IN box for review

## 2016-02-05 ENCOUNTER — Telehealth: Payer: Self-pay

## 2016-02-05 DIAGNOSIS — M5136 Other intervertebral disc degeneration, lumbar region: Secondary | ICD-10-CM | POA: Diagnosis not present

## 2016-02-05 DIAGNOSIS — M4806 Spinal stenosis, lumbar region: Secondary | ICD-10-CM | POA: Diagnosis not present

## 2016-02-05 DIAGNOSIS — M5416 Radiculopathy, lumbar region: Secondary | ICD-10-CM | POA: Diagnosis not present

## 2016-02-05 NOTE — Telephone Encounter (Signed)
Filled and in Kim's box. 

## 2016-02-05 NOTE — Telephone Encounter (Signed)
Iron infusion order was faxed to [phone], pt was notified to call [phone] to schedule an appt. Phone number was provided to pt and pt verbalized understanding.

## 2016-02-08 ENCOUNTER — Encounter: Payer: Self-pay | Admitting: Cardiovascular Disease

## 2016-02-08 ENCOUNTER — Ambulatory Visit (INDEPENDENT_AMBULATORY_CARE_PROVIDER_SITE_OTHER): Payer: BLUE CROSS/BLUE SHIELD | Admitting: Cardiovascular Disease

## 2016-02-08 VITALS — BP 100/64 | HR 49 | Ht 69.0 in | Wt 230.5 lb

## 2016-02-08 DIAGNOSIS — I471 Supraventricular tachycardia: Secondary | ICD-10-CM

## 2016-02-08 DIAGNOSIS — R600 Localized edema: Secondary | ICD-10-CM | POA: Diagnosis not present

## 2016-02-08 DIAGNOSIS — I10 Essential (primary) hypertension: Secondary | ICD-10-CM | POA: Diagnosis not present

## 2016-02-08 NOTE — Patient Instructions (Signed)
Medication Instructions:   No medication changes  Labwork:  No labs needed  Testing/Procedures:  No testing needed  Follow-Up: It was a pleasure seeing you in the office today. Please call us if you have new issues that need to be addressed before your next appt.  [phone]  Your physician wants you to follow-up in: 12 months.  You will receive a reminder letter in the mail two months in advance. If you don't receive a letter, please call our office to schedule the follow-up appointment.  If you need a refill on your cardiac medications before your next appointment, please call your pharmacy.

## 2016-02-08 NOTE — Progress Notes (Signed)
Cardiology Office Note  Date:  02/08/2016   ID:  Claire Strong, Claire Strong Feb 08, 1961, MRN [id number]  PCP:  Ria Bush, MD   Chief Complaint  Patient presents with  . Other    1 yr f/u. Meds reviewed verbally with pt.    HPI:  55 year old woman with h/o lupus anticoagulant with remote DVT, HTN, horseshoe kidney, asthma and multinodular goiter s/p thyroidectomy,Obesity,  s/p roux en y gastric bypass,  post surgical hypertensive/palpitation episode associated with severe HA seen at [facility] ER, who presents for routine evaluation of her palpitations. she has a long history of palpitations, atrial tachycardia. prior Holter monitor showed atrial tachycardia.  In follow-up today she reports that she is doing well Reports that she is having Menopause issues, weight gain issues  she does have Bradycardia,but denies having any symptoms  Feels that she is tired from low iron, level of  25,  schedule to get iron infusion Pills are not working for her, causes constipation   chronic back discomfort. Out of work secondary to chronic pain  New hip Nov 06 2015, Bulging disk, nonsurgical situation  Blood pressure running in a reasonable range,   EKG on today's visit shows sinus bradycardia with rate 49 bpm, no significant ST or T-wave changes  She takes Lasix for leg edema History of weight loss after GI surgery  Prior total knee replacement on the left  Other past medical history  History of varicose veins.   History of brain surgery on the left.   Father with CAD and CABG at 20 yo, was smoker.  PMH:   has a past medical history of Bleeding disorder ([place]); Bronchial asthma; Cancer ([place]); Cavovarus deformity of foot; Chronic venous insufficiency (2007); Depression; Diverticulosis; Fibromyalgia (02/2014); History of DVT (deep vein thrombosis); History of gastric ulcer (2009); Horseshoe kidney; HTN (hypertension); Hypothyroidism; Kidney stones; Lupus anticoagulant disorder ([place]); Multinodular  goiter; Nasal septal perforation; Osteoarthritis; Overweight; Post-surgical hypothyroidism; S/P gastric bypass (05/21/2005); Seasonal allergies; Sight deterioration; and Tachycardia.  PSH:    Past Surgical History:  Procedure Laterality Date  . ABDOMINAL HYSTERECTOMY    . ANKLE FUSION Left 05/2013  . APPENDECTOMY  1978  . BACK SURGERY  2009  . CHOLECYSTECTOMY  1998  . COLONOSCOPY  05/2012   hyperplastic polyps x2, mild diverticulosis Deatra Ina) rpt 10 yrs  . CYST EXCISION     back  and shoulder  . esi Bilateral 06/2015, 08/2015   L4/5 transforaminal ESI (Ch[place]nis)  . ESI Right 10/2015   C4/5 ESI (Chasnis)  . GASTRIC BYPASS  05/21/05   Dr. Frutoso Chase, Roux-en-Y  . Greenfield filter removal  2015   removed 6 wks after surgery  . hammerhead toes Left   . HERNIA REPAIR    . lyphoma excision  08/2015   arm  . NASAL SEPTUM SURGERY  1981   deviated septum, repaired at [facility]  . PARTIAL HYSTERECTOMY  1989   ovaries remain  . SKIN SURGERY     basel cell  . TONSILLECTOMY  1969  . TOTAL HIP ARTHROPLASTY Right 11/06/2015   Procedure: TOTAL HIP ARTHROPLASTY ANTERIOR APPROACH;  Surgeon: Hessie Knows, MD;  Location: [facility] ORS;  Service: Orthopedics;  Laterality: Right;  . TOTAL KNEE ARTHROPLASTY Left 08/2013  . TOTAL THYROIDECTOMY  09/2011   benign path (done for multinodular goiter concern for cancer)    Current Outpatient Prescriptions  Medication Sig Dispense Refill  . ACETAMINOPHEN-BUTALBITAL 50-325 MG TABS Take 1 tablet by mouth at bedtime. Reported on 09/03/2015 3 times  a week    . albuterol (PROVENTIL HFA;VENTOLIN HFA) 108 (90 BASE) MCG/ACT inhaler Inhale 2 puffs into the lungs every 6 (six) hours as needed. 1 Inhaler 11  . aspirin 81 MG tablet Take 81 mg by mouth daily.    . benazepril (LOTENSIN) 10 MG tablet TAKE ONE TABLET BY MOUTH ONCE DAILY (Patient taking differently: TAKE ONE TABLET BY MOUTH ONCE DAILY qhs) 90 tablet 3  . carisoprodol (SOMA) 350 MG tablet Take 1 tablet (350 mg total) by  mouth at bedtime. 90 tablet 1  . cetirizine (ZYRTEC) 10 MG tablet Take 10 mg by mouth daily.    . Cholecalciferol (VITAMIN D3) 1000 UNITS tablet Take 2,000 Units by mouth daily.     . citalopram (CELEXA) 40 MG tablet TAKE ONE TABLET BY MOUTH ONCE DAILY 90 tablet 0  . diclofenac sodium (VOLTAREN) 1 % GEL Apply 1 application topically 3 (three) times daily. (Patient taking differently: Apply 1 application topically 3 (three) times daily as needed. ) 1 Tube 1  . enoxaparin (LOVENOX) 40 MG/0.4ML injection Inject 0.4 mLs (40 mg total) into the skin daily. 14 Syringe 0  . EPINEPHrine (EPI-PEN) 0.3 mg/0.3 mL DEVI Inject 0.3 mLs (0.3 mg total) into the muscle once. 1 Device 2  . Fluticasone-Salmeterol (ADVAIR DISKUS) 100-50 MCG/DOSE AEPB Inhale 1 puff into the lungs every 12 (twelve) hours. (Patient taking differently: Inhale 1 puff into the lungs 2 (two) times daily as needed. ) 60 each 3  . furosemide (LASIX) 20 MG tablet TAKE ONE TABLET BY MOUTH ONCE DAILY 90 tablet 3  . gabapentin (NEURONTIN) 300 MG capsule Take 300 mg by mouth 3 (three) times a week.     Marland Kitchen HYDROcodone-acetaminophen (NORCO) 7.5-325 MG tablet Take 1-2 tablets by mouth every 4 (four) hours as needed (breakthrough pain). 60 tablet 0  . levothyroxine (SYNTHROID, LEVOTHROID) 150 MCG tablet Take 150 mcg by mouth daily before breakfast.    . metoprolol tartrate (LOPRESSOR) 25 MG tablet Take 25 mg by mouth 2 (two) times daily.     . montelukast (SINGULAIR) 10 MG tablet Take 10 mg by mouth every morning.     . ondansetron (ZOFRAN) 4 MG tablet Take 1 tablet (4 mg total) by mouth every 6 (six) hours as needed for nausea. 20 tablet 0  . scopolamine (TRANSDERM-SCOP) 1 MG/3DAYS Place 1 patch (1.5 mg total) onto the skin every 3 (three) days. 4 patch 0  . traMADol (ULTRAM) 50 MG tablet Take 1 tablet (50 mg total) by mouth 2 (two) times daily as needed for pain. (Patient taking differently: Take 50 mg by mouth daily. ) 40 tablet 0  . traZODone  (DESYREL) 50 MG tablet TAKE ONE-HALF TO ONE TABLET BY MOUTH AT BEDTIME AS NEEDED FOR SLEEP 90 tablet 1  . vitamin B-12 (CYANOCOBALAMIN) 500 MCG tablet Take 500 mcg by mouth daily.     No current facility-administered medications for this visit.      Allergies:   Sulfa antibiotics; Cefuroxime axetil; Nutritional supplements; and Oxycodone-acetaminophen   Social History:  The patient  reports that she quit smoking about 11 years ago. She has never used smokeless tobacco. She reports that she drinks alcohol. She reports that she does not use drugs.   Family History:   family history includes Alcohol abuse in her father; Alzheimer's disease in her mother; Cancer in her father and maternal uncle; Coronary artery disease (age of onset: 82) in her father; Diabetes in her father, mother, and paternal grandmother; Hyperlipidemia in  her mother.    Review of Systems: Review of Systems  Constitutional: Positive for malaise/fatigue.  Respiratory: Negative.   Cardiovascular: Negative.   Gastrointestinal: Negative.   Musculoskeletal: Negative.   Neurological: Negative.   Psychiatric/Behavioral: Negative.   All other systems reviewed and are negative.    PHYSICAL EXAM: VS:  BP 100/64 (BP Location: Left Arm, Patient Position: Sitting, Cuff Size: Large)   Pulse (!) 49   Ht 5\' 9"  (1.753 m)   Wt 230 lb 8 oz (104.6 kg)   BMI 34.04 kg/m  , BMI Body mass index is 34.04 kg/m. GEN: Well nourished, well developed, in no acute distress, obese  HEENT: normal  Neck: no JVD, carotid bruits, or masses Cardiac: RRR; no murmurs, rubs, or gallops,no edema ,  varicose veins bilaterally Respiratory:  clear to auscultation bilaterally, normal work of breathing GI: soft, nontender, nondistended, + BS MS: no deformity or atrophy  Skin: warm and dry, no rash Neuro:  Strength and sensation are intact Psych: euthymic mood, full affect    Recent Labs: 02/13/2015: ALT 13 11/08/2015: BUN 9; Creatinine, Ser 0.78;  Hemoglobin 10.5; Platelets 128; Potassium 3.7; Sodium 130 12/13/2015: TSH 1.00    Lipid Panel Lab Results  Component Value Date   CHOL 156 02/13/2015   HDL 41.60 02/13/2015   LDLCALC 98 02/13/2015   TRIG 83.0 02/13/2015      Wt Readings from Last 3 Encounters:  02/08/16 230 lb 8 oz (104.6 kg)  11/06/15 234 lb (106.1 kg)  10/31/15 234 lb (106.1 kg)       ASSESSMENT AND PLAN:  Atrial tachycardia ([place]) - Plan: EKG 12-Lead No significant recent sx, On metoprolol BID. Asymptomatic bradycardia  Essential hypertension Blood pressure is well controlled on today's visit. No changes made to the medications.  Localized edema Venous insuff, recommended compression hose   Total encounter time more than 15 minutes  Greater than 50% was spent in counseling and coordination of care with the patient   Disposition:   F/U  12 months   Orders Placed This Encounter  Procedures  . EKG 12-Lead     Signed, Esmond Plants, M.D., Ph.D. 02/08/2016  [place], [place]

## 2016-02-11 ENCOUNTER — Telehealth: Payer: Self-pay

## 2016-02-11 ENCOUNTER — Other Ambulatory Visit (HOSPITAL_COMMUNITY): Payer: Self-pay | Admitting: *Deleted

## 2016-02-11 NOTE — Telephone Encounter (Signed)
Renee with [facility] left v/m that pt is coming in for iron on 02/12/16; Dose written for Feraheme 550 mg; Renee said Claire Strong comes in 510. Renee request cb with correct dosage of Feraheme.

## 2016-02-11 NOTE — Telephone Encounter (Signed)
Correct dose is 510mg . Thank you.

## 2016-02-12 ENCOUNTER — Ambulatory Visit (HOSPITAL_COMMUNITY)
Admission: RE | Admit: 2016-02-12 | Discharge: 2016-02-12 | Disposition: A | Discharge status: Home / Self Care | Payer: BLUE CROSS/BLUE SHIELD | Source: Ambulatory Visit | Attending: Family Medicine | Admitting: Family Medicine

## 2016-02-12 DIAGNOSIS — D509 Iron deficiency anemia, unspecified: Secondary | ICD-10-CM | POA: Insufficient documentation

## 2016-02-12 MED ORDER — SODIUM CHLORIDE 0.9 % IV SOLN
510.0000 mg | Freq: Once | INTRAVENOUS | Status: AC
  Administered 2016-02-12: 510 mg via INTRAVENOUS
  Filled 2016-02-12: qty 17

## 2016-02-12 NOTE — Telephone Encounter (Signed)
Kristin at short stay notified.

## 2016-02-13 ENCOUNTER — Telehealth: Payer: Self-pay | Admitting: Cardiovascular Disease

## 2016-02-13 NOTE — Telephone Encounter (Signed)
Pt calling stating [facility] in [place] called her telling her she needs a PA on  Metoprolol  Please advise

## 2016-02-14 NOTE — Telephone Encounter (Signed)
PA has been forwarded through Cover my meds for Metoprolol Tartrate 25 mg.  Awaiting Approval

## 2016-02-15 ENCOUNTER — Other Ambulatory Visit: Payer: Self-pay

## 2016-02-15 ENCOUNTER — Telehealth: Payer: Self-pay | Admitting: Cardiovascular Disease

## 2016-02-15 MED ORDER — METOPROLOL TARTRATE 25 MG PO TABS: 25.0000 mg | ORAL_TABLET | Freq: Two times a day (BID) | ORAL | 0 refills | Status: DC

## 2016-02-15 NOTE — Telephone Encounter (Signed)
Left voicemail message for patient to call back and verify medications and review prior authorization request.

## 2016-02-15 NOTE — Telephone Encounter (Signed)
Fax received from [employer] of [place] stating that metoprolol does not require prior authorization and that prescription may be sent to pharmacy for processing. Original fax received placed in "Save" bin on Taniyah Ballow's desk.

## 2016-02-15 NOTE — Telephone Encounter (Signed)
Spoke with Zenaida Niece from [place] pharmacy and she reports that patient just needed refill. Let her know that [employer] sent [place] a fax stating that no prior authorization was needed. Also let her know that last time we filled this was in 2015. She reports that Dr. Sharen Hones has been refilling her medications and that she was sending this to his office for refill. Instructed her to please give me a call if any problems and I would be happy to assist.

## 2016-02-15 NOTE — Telephone Encounter (Signed)
Rx sent electronically.  

## 2016-02-15 NOTE — Telephone Encounter (Signed)
See telephone note. [employer] sent fax stating that metoprolol does not require prior auth. Called patient and will also contact pharmacy once open. See telephone entry for this same request.

## 2016-02-15 NOTE — Telephone Encounter (Signed)
Spoke with patient and let her know that pharmacy was forwarding refill request to Dr. Sharen Hones office and that if she has any problems to please give me a call back. She verbalized understanding and had no further questions.

## 2016-02-15 NOTE — Telephone Encounter (Signed)
Left detailed voicemail message for patient letting her know that pharmacy is sending prescription for refill to Dr. Sharen Hones and no prior authorization was needed for this and to call back if she has any further questions or if there are any problems.

## 2016-03-05 DIAGNOSIS — M5030 Other cervical disc degeneration, unspecified cervical region: Secondary | ICD-10-CM | POA: Diagnosis not present

## 2016-03-05 DIAGNOSIS — M5412 Radiculopathy, cervical region: Secondary | ICD-10-CM | POA: Diagnosis not present

## 2016-03-05 DIAGNOSIS — M4802 Spinal stenosis, cervical region: Secondary | ICD-10-CM | POA: Diagnosis not present

## 2016-03-08 ENCOUNTER — Encounter: Payer: Self-pay | Admitting: Family Medicine

## 2016-03-11 ENCOUNTER — Encounter: Payer: Self-pay | Admitting: Family Medicine

## 2016-03-12 ENCOUNTER — Ambulatory Visit (INDEPENDENT_AMBULATORY_CARE_PROVIDER_SITE_OTHER): Payer: BLUE CROSS/BLUE SHIELD | Admitting: Family Medicine

## 2016-03-12 ENCOUNTER — Encounter: Payer: Self-pay | Admitting: Family Medicine

## 2016-03-12 VITALS — BP 108/78 | HR 50 | Temp 98.5°F | Ht 69.0 in | Wt 235.5 lb

## 2016-03-12 DIAGNOSIS — S8001XA Contusion of right knee, initial encounter: Secondary | ICD-10-CM

## 2016-03-12 DIAGNOSIS — R0781 Pleurodynia: Secondary | ICD-10-CM | POA: Diagnosis not present

## 2016-03-12 MED ORDER — TRAMADOL HCL 50 MG PO TABS: 50.0000 mg | ORAL_TABLET | Freq: Four times a day (QID) | ORAL | 0 refills | Status: DC | PRN

## 2016-03-12 MED ORDER — ONDANSETRON HCL 4 MG PO TABS: 4.0000 mg | ORAL_TABLET | Freq: Four times a day (QID) | ORAL | 0 refills | Status: DC | PRN

## 2016-03-12 NOTE — Progress Notes (Signed)
Pre visit review using our clinic review tool, if applicable. No additional management support is needed unless otherwise documented below in the visit note. 

## 2016-03-12 NOTE — Progress Notes (Signed)
Dr. Karleen Hampshire T. Laporsha Grealish, MD, CAQ [facility] [address] [place], [address] Phone: [phone] Fax: [phone]  03/12/2016  Patient: Claire Strong, MRN: [id number], DOB: [birth year], 55 y.o.  Primary Physician:  Eustaquio Boyden, MD   Chief Complaint  Patient presents with  . Fall    on Friday  . Rib Bruising  . Knee Pain   Subjective:   Claire Strong is a 55 y.o. very pleasant female patient who presents with the following:  DOI: 03/07/2016  Larey Seat on Friday hit her right knee. Pavement - got caught on her curve. She fell and struck her right knee. She is not limping, but she does have a significant hematoma and bruising on the medial aspect.  L sided rib fx - significant pain on the left side of her chest was significant bruising. She has pain with coughing and sneezing.  R knee hematoma  Past Medical History, Surgical History, Social History, Family History, Problem List, Medications, and Allergies have been reviewed and updated if relevant.  Patient Active Problem List   Diagnosis Date Noted  . Primary osteoarthritis of right hip 11/06/2015  . History of kidney stones 09/03/2015  . Difficulty urinating 09/03/2015  . Osteoarthritis   . Right sided abdominal pain 05/10/2014  . Fibromyalgia 02/14/2014  . Atrial tachycardia (HCC) 01/25/2013  . Hypothyroidism, postsurgical 02/25/2012  . Edema 01/28/2012  . Headache(784.0) 09/25/2011  . Bronchial asthma   . Healthcare maintenance 03/18/2011  . Hot flashes 03/18/2011  . Nasal septal perforation   . Horseshoe kidney   . HTN (hypertension)   . Depression   . Lupus anticoagulant disorder (HCC)   . 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 Bleeding disorder (HCC)   . Bronchial asthma   . Cancer (HCC)    melanoma  . 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 Trula Ore ortho)  . Chronic venous insufficiency  2007   s/p vein stripping and laser ablation  . Depression   . Diverticulosis    mild by colonoscopy  . Fibromyalgia 02/2014   [place]  . History of DVT (deep vein thrombosis)    DVTs after 1st pregnancy, not on AC 2/2 bleeding ulcer, greenfield filter in place  . History of gastric ulcer 2009  . Horseshoe kidney    sole, R kidney damage  . HTN (hypertension)   . Hypothyroidism   . Kidney stones    s/p renal hematoma after lithotripsy on right  . Lupus anticoagulant disorder (HCC)   . Multinodular goiter    s/p thyroidectomy  . Nasal septal perforation    chronic, ENT rec avoid antihistamine, INS  . Osteoarthritis    ?FM by rheum  . Overweight   . Post-surgical hypothyroidism    for multinodular goiter  . 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 Jacqulyn Ducking  . Seasonal allergies    allergy shots, singulair  . 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 Tachycardia     Past Surgical History:  Procedure Laterality Date  . ABDOMINAL HYSTERECTOMY    . ANKLE FUSION Left 05/2013  . APPENDECTOMY  1978  . BACK SURGERY  2009  . CHOLECYSTECTOMY  1998  . COLONOSCOPY  05/2012   hyperplastic polyps x2, mild diverticulosis Arlyce Dice) rpt 10 yrs  . CYST EXCISION     back  and shoulder  . ESI Right  10/2015, 02/2016   C4/5 then C5/6 ESI ([facility])  . ESI Bilateral 06/2015, 08/2015   L4/5 transforaminal ESI ([facility]nis)  . GASTRIC BYPASS  05/21/05   Dr. Jacqulyn Ducking, Roux-en-Y  . Greenfield filter removal  2015   removed 6 wks after surgery  . hammerhead toes Left   . HERNIA REPAIR    . lyphoma excision  08/2015   arm  . NASAL SEPTUM SURGERY  1981   deviated septum, repaired at [facility]  . PARTIAL HYSTERECTOMY  1989   ovaries remain  . SKIN SURGERY     basel cell  . TONSILLECTOMY  1969  . TOTAL HIP ARTHROPLASTY Right 11/06/2015   Procedure: TOTAL HIP ARTHROPLASTY ANTERIOR APPROACH;  Surgeon: Kennedy Bucker, MD;  Location: [facility] ORS;  Service: Orthopedics;  Laterality: Right;  . TOTAL KNEE ARTHROPLASTY Left  08/2013  . TOTAL THYROIDECTOMY  09/2011   benign path (done for multinodular goiter concern for cancer)    Social History   Social History  . Marital status: Married    Spouse name: Chrissie Noa  . Number of children: 2  . Years of education: 12   Occupational History  . 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 Smoking status: Former Smoker    Quit date: 06/16/2004  . Smokeless tobacco: Never Used     Comment: Socially-no longer  . Alcohol use 0.0 oz/week     Comment: Occasionally-wine  . Drug use: No  . Sexual activity: Not on file   Other Topics Concern  . Not on file   Social History Narrative   Caffeine: 1 cup coffee/day   Lives with husband [name]) and mother in [place], youngest daughter.  1 dog.   Edu: 12th grade   Occupation: Environmental health practitioner   Activity: taking care of grandson, no regular exercise   Diet: good water, fruits/vegetables daily    Family History  Problem Relation Age of Onset  . Diabetes Mother     prediabetes  . Alzheimer's disease Mother   . Hyperlipidemia Mother   . Diabetes Father   . Coronary artery disease Father 27    CABG  . Alcohol abuse Father   . Cancer Father     lung  . Cancer Maternal Uncle     brain  . Diabetes Paternal Grandmother   . Stroke Neg Hx   . Colon cancer Neg Hx   . Stomach cancer Neg Hx   . Kidney disease Neg Hx     Allergies  Allergen Reactions  . Sulfa Antibiotics Shortness Of Breath  . Cefuroxime Axetil Other (See Comments)    Bleeding ulcer  . Nutritional Supplements Swelling    Unsure which  . Oxycodone-Acetaminophen Nausea And Vomiting  . Betadine [Povidone Iodine] Rash    Medication list reviewed and updated in full in [facility].  GEN: No fevers, chills. Nontoxic. Primarily MSK c/o today. MSK: Detailed in the HPI GI: tolerating PO intake without difficulty Neuro: No numbness, parasthesias, or tingling associated. Otherwise the  pertinent positives of the ROS are noted above.   Objective:   BP 108/78   Pulse (!) 50   Temp 98.5 F (36.9 C) (Oral)   Ht 5\' 9"  (1.753 m)   Wt 235 lb 8 oz (106.8 kg)   BMI 34.78 kg/m    GEN: WDWN, NAD, Non-toxic, Alert & Oriented x 3 HEENT: Atraumatic, Normocephalic.  Ears and Nose: No external deformity. EXTR:  No clubbing/cyanosis/edema NEURO: Normal gait.  PSYCH: Normally interactive. Conversant. Not depressed or anxious appearing.  Calm demeanor.    Chest wall: Tender to palpation at approximately rib 6 7 on the left anteriorly. There is significant bruising. This portion of the physical examination was chaperoned by Terese Door, CMA.   Right knee has full extension and flexed 115. No significant joint line tenderness. Medial aspect of the knee there is a hematoma that is palpable and painful to touch. Nontender at the patella.  Radiology: No results found.  Assessment and Plan:   Rib pain on left side  Traumatic hematoma of right knee, initial encounter  Almost certainly the patient has a rib fracture. Treat as such. Conservative management with Tylenol and pain medication. Anticipate 4-6 weeks for healing.  Reassured the patient regarding the hematoma on her right knee. If pain persists greater than 4 weeks, additional evaluation would be recommended.  Follow-up: No Follow-up on file.  Modified Medications   Modified Medication Previous Medication   ONDANSETRON (ZOFRAN) 4 MG TABLET ondansetron (ZOFRAN) 4 MG tablet      Take 1 tablet (4 mg total) by mouth every 6 (six) hours as needed for nausea.    Take 1 tablet (4 mg total) by mouth every 6 (six) hours as needed for nausea.   TRAMADOL (ULTRAM) 50 MG TABLET traMADol (ULTRAM) 50 MG tablet      Take 1 tablet (50 mg total) by mouth every 6 (six) hours as needed for moderate pain.    Take 1 tablet (50 mg total) by mouth 2 (two) times daily as needed for pain.   No orders of the defined types were placed in this  encounter.   Signed,  Elpidio Galea. Dyami Umbach, MD   Patient's Medications  New Prescriptions   No medications on file  Previous Medications   ACETAMINOPHEN-BUTALBITAL 50-325 MG TABS    Take 1 tablet by mouth at bedtime. Reported on 09/03/2015 3 times a week   ALBUTEROL (PROVENTIL HFA;VENTOLIN HFA) 108 (90 BASE) MCG/ACT INHALER    Inhale 2 puffs into the lungs every 6 (six) hours as needed.   ASPIRIN 81 MG TABLET    Take 81 mg by mouth daily.   BENAZEPRIL (LOTENSIN) 10 MG TABLET    TAKE ONE TABLET BY MOUTH ONCE DAILY   CARISOPRODOL (SOMA) 350 MG TABLET    Take 1 tablet (350 mg total) by mouth at bedtime.   CETIRIZINE (ZYRTEC) 10 MG TABLET    Take 10 mg by mouth daily.   CHOLECALCIFEROL (VITAMIN D3) 1000 UNITS TABLET    Take 2,000 Units by mouth daily.    CITALOPRAM (CELEXA) 40 MG TABLET    TAKE ONE TABLET BY MOUTH ONCE DAILY   DICLOFENAC SODIUM (VOLTAREN) 1 % GEL    Apply 1 application topically 3 (three) times daily.   ENOXAPARIN (LOVENOX) 40 MG/0.4ML INJECTION    Inject 0.4 mLs (40 mg total) into the skin daily.   EPINEPHRINE (EPI-PEN) 0.3 MG/0.3 ML DEVI    Inject 0.3 mLs (0.3 mg total) into the muscle once.   FLUTICASONE-SALMETEROL (ADVAIR DISKUS) 100-50 MCG/DOSE AEPB    Inhale 1 puff into the lungs every 12 (twelve) hours.   FUROSEMIDE (LASIX) 20 MG TABLET    TAKE ONE TABLET BY MOUTH ONCE DAILY   GABAPENTIN (NEURONTIN) 300 MG CAPSULE    Take 300 mg by mouth 3 (three) times a week.    HYDROCODONE-ACETAMINOPHEN (NORCO) 7.5-325 MG TABLET    Take 1-2 tablets by  mouth every 4 (four) hours as needed (breakthrough pain).   LEVOTHYROXINE (SYNTHROID, LEVOTHROID) 150 MCG TABLET    Take 150 mcg by mouth daily before breakfast.   METOPROLOL TARTRATE (LOPRESSOR) 25 MG TABLET    Take 1 tablet (25 mg total) by mouth 2 (two) times daily.   MONTELUKAST (SINGULAIR) 10 MG TABLET    Take 10 mg by mouth every morning.    SCOPOLAMINE (TRANSDERM-SCOP) 1 MG/3DAYS    Place 1 patch (1.5 mg total) onto the skin  every 3 (three) days.   TRAZODONE (DESYREL) 50 MG TABLET    TAKE ONE-HALF TO ONE TABLET BY MOUTH AT BEDTIME AS NEEDED FOR SLEEP   VITAMIN B-12 (CYANOCOBALAMIN) 500 MCG TABLET    Take 500 mcg by mouth daily.  Modified Medications   Modified Medication Previous Medication   ONDANSETRON (ZOFRAN) 4 MG TABLET ondansetron (ZOFRAN) 4 MG tablet      Take 1 tablet (4 mg total) by mouth every 6 (six) hours as needed for nausea.    Take 1 tablet (4 mg total) by mouth every 6 (six) hours as needed for nausea.   TRAMADOL (ULTRAM) 50 MG TABLET traMADol (ULTRAM) 50 MG tablet      Take 1 tablet (50 mg total) by mouth every 6 (six) hours as needed for moderate pain.    Take 1 tablet (50 mg total) by mouth 2 (two) times daily as needed for pain.  Discontinued Medications   No medications on file

## 2016-03-17 ENCOUNTER — Other Ambulatory Visit: Payer: Self-pay | Admitting: Family Medicine

## 2016-03-25 ENCOUNTER — Other Ambulatory Visit: Payer: Self-pay | Admitting: Family Medicine

## 2016-03-25 ENCOUNTER — Other Ambulatory Visit (INDEPENDENT_AMBULATORY_CARE_PROVIDER_SITE_OTHER): Payer: BLUE CROSS/BLUE SHIELD

## 2016-03-25 DIAGNOSIS — D509 Iron deficiency anemia, unspecified: Secondary | ICD-10-CM

## 2016-03-25 DIAGNOSIS — I10 Essential (primary) hypertension: Secondary | ICD-10-CM | POA: Diagnosis not present

## 2016-03-25 DIAGNOSIS — M1990 Unspecified osteoarthritis, unspecified site: Secondary | ICD-10-CM | POA: Diagnosis not present

## 2016-03-25 LAB — COMPREHENSIVE METABOLIC PANEL
ALBUMIN: 3.9 g/dL (ref 3.5–5.2)
ALK PHOS: 157 U/L — AB (ref 39–117)
ALT: 18 U/L (ref 0–35)
AST: 16 U/L (ref 0–37)
BUN: 17 mg/dL (ref 6–23)
CALCIUM: 9.1 mg/dL (ref 8.4–10.5)
CHLORIDE: 102 meq/L (ref 96–112)
CO2: 27 mEq/L (ref 19–32)
Creatinine, Ser: 1.08 mg/dL (ref 0.40–1.20)
GFR: 56.06 mL/min — AB (ref >60.00)
Glucose, Bld: 97 mg/dL (ref 70–99)
POTASSIUM: 4 meq/L (ref 3.5–5.1)
SODIUM: 137 meq/L (ref 135–145)
TOTAL PROTEIN: 6.6 g/dL (ref 6.0–8.3)
Total Bilirubin: 0.6 mg/dL (ref 0.2–1.2)

## 2016-03-25 LAB — CBC WITH DIFFERENTIAL/PLATELET
BASOS PCT: 0.6 % (ref 0.0–3.0)
Basophils Absolute: 0 10*3/uL (ref 0.0–0.1)
EOS PCT: 2.5 % (ref 0.0–5.0)
Eosinophils Absolute: 0.1 10*3/uL (ref 0.0–0.7)
HEMATOCRIT: 41.3 % (ref 36.0–46.0)
HEMOGLOBIN: 14 g/dL (ref 12.0–15.0)
LYMPHS PCT: 35.8 % (ref 12.0–46.0)
Lymphs Abs: 1.6 10*3/uL (ref 0.7–4.0)
MCHC: 33.9 g/dL (ref 30.0–36.0)
MCV: 87.7 fl (ref 78.0–100.0)
MONO ABS: 0.3 10*3/uL (ref 0.1–1.0)
MONOS PCT: 6.1 % (ref 3.0–12.0)
Neutro Abs: 2.4 10*3/uL (ref 1.4–7.7)
Neutrophils Relative %: 55 % (ref 43.0–77.0)
Platelets: 223 10*3/uL (ref 150.0–400.0)
RBC: 4.72 Mil/uL (ref 3.87–5.11)
RDW: 14.8 % (ref 11.5–15.5)
WBC: 4.4 10*3/uL (ref 4.0–10.5)

## 2016-03-25 LAB — IBC PANEL
Iron: 113 ug/dL (ref 42–145)
SATURATION RATIOS: 34.3 % (ref 20.0–50.0)
Transferrin: 235 mg/dL (ref 212.0–360.0)

## 2016-03-25 LAB — SEDIMENTATION RATE: SED RATE: 7 mm/h (ref 0–30)

## 2016-03-25 LAB — LIPID PANEL
CHOLESTEROL: 200 mg/dL (ref 0–200)
HDL: 39.5 mg/dL (ref >39.00)
LDL CALC: 130 mg/dL — AB (ref 0–99)
NonHDL: 160.54
TRIGLYCERIDES: 151 mg/dL — AB (ref 0.0–149.0)
Total CHOL/HDL Ratio: 5
VLDL: 30.2 mg/dL (ref 0.0–40.0)

## 2016-03-25 LAB — FERRITIN: FERRITIN: 43.7 ng/mL (ref 10.0–291.0)

## 2016-03-25 NOTE — Addendum Note (Signed)
Addended by: Baldomero Lamy on: 03/25/2016 09:23 AM   Modules accepted: Orders

## 2016-03-26 DIAGNOSIS — M5136 Other intervertebral disc degeneration, lumbar region: Secondary | ICD-10-CM | POA: Diagnosis not present

## 2016-03-26 DIAGNOSIS — M5416 Radiculopathy, lumbar region: Secondary | ICD-10-CM | POA: Diagnosis not present

## 2016-04-01 ENCOUNTER — Ambulatory Visit (INDEPENDENT_AMBULATORY_CARE_PROVIDER_SITE_OTHER): Payer: BLUE CROSS/BLUE SHIELD | Admitting: Family Medicine

## 2016-04-01 ENCOUNTER — Encounter: Payer: Self-pay | Admitting: Family Medicine

## 2016-04-01 VITALS — BP 122/78 | HR 60 | Temp 98.4°F | Ht 68.0 in | Wt 233.5 lb

## 2016-04-01 DIAGNOSIS — J3489 Other specified disorders of nose and nasal sinuses: Secondary | ICD-10-CM

## 2016-04-01 DIAGNOSIS — M1990 Unspecified osteoarthritis, unspecified site: Secondary | ICD-10-CM

## 2016-04-01 DIAGNOSIS — M797 Fibromyalgia: Secondary | ICD-10-CM

## 2016-04-01 DIAGNOSIS — E890 Postprocedural hypothyroidism: Secondary | ICD-10-CM

## 2016-04-01 DIAGNOSIS — Z0000 Encounter for general adult medical examination without abnormal findings: Secondary | ICD-10-CM | POA: Diagnosis not present

## 2016-04-01 DIAGNOSIS — I471 Supraventricular tachycardia: Secondary | ICD-10-CM

## 2016-04-01 DIAGNOSIS — F3341 Major depressive disorder, recurrent, in partial remission: Secondary | ICD-10-CM

## 2016-04-01 DIAGNOSIS — D509 Iron deficiency anemia, unspecified: Secondary | ICD-10-CM

## 2016-04-01 DIAGNOSIS — I10 Essential (primary) hypertension: Secondary | ICD-10-CM

## 2016-04-01 DIAGNOSIS — Z9884 Bariatric surgery status: Secondary | ICD-10-CM

## 2016-04-01 DIAGNOSIS — J209 Acute bronchitis, unspecified: Secondary | ICD-10-CM

## 2016-04-01 DIAGNOSIS — D6862 Lupus anticoagulant syndrome: Secondary | ICD-10-CM

## 2016-04-01 MED ORDER — BENZONATATE 100 MG PO CAPS: 100.0000 mg | ORAL_CAPSULE | Freq: Two times a day (BID) | ORAL | 0 refills | Status: DC | PRN

## 2016-04-01 MED ORDER — FLUCONAZOLE 150 MG PO TABS: 150.0000 mg | ORAL_TABLET | Freq: Once | ORAL | 0 refills | Status: AC

## 2016-04-01 MED ORDER — AZITHROMYCIN 250 MG PO TABS: ORAL_TABLET | ORAL | 0 refills | Status: DC

## 2016-04-01 NOTE — Assessment & Plan Note (Signed)
Treat given duration of symptoms with zpack course. Tessalon perls for cough.  Pt requests diflucan Rx given h/o recurrent yeast infections after all antibiotics. Discussed interaction between zpack, diflucan and celexa - pt aware to wait 1 week between diflucan and zpack administration.

## 2016-04-01 NOTE — Assessment & Plan Note (Signed)
Preventative protocols reviewed and updated unless pt declined. Discussed healthy diet and lifestyle.  

## 2016-04-01 NOTE — Assessment & Plan Note (Signed)
Continue aspirin daily. Receives lovenox injections around surgeries.

## 2016-04-01 NOTE — Assessment & Plan Note (Signed)
Stable on levothyroxine daily.

## 2016-04-01 NOTE — Assessment & Plan Note (Addendum)
Check vitamin levels next visit. Iron levels stable after iron infusion x2. Encouraged iron rich diet. Pt cannot tolerate oral iron.

## 2016-04-01 NOTE — Assessment & Plan Note (Signed)
Chronic, stable. Continue current regimen. 

## 2016-04-01 NOTE — Assessment & Plan Note (Signed)
Chronic, stable on high dose celexa and trazodone nightly.

## 2016-04-01 NOTE — Assessment & Plan Note (Addendum)
Followed by ENT

## 2016-04-01 NOTE — Patient Instructions (Signed)
Flu shot when feeling better For bronchitis - treat with zpack and tessalon perls and let us know if not improving with treatment.  You are doing well. Return as needed or in 1 year for next physical.   Health Maintenance, Female Adopting a healthy lifestyle and getting preventive care can go a long way to promote health and wellness. Talk with your health care provider about what schedule of regular examinations is right for you. This is a good chance for you to check in with your provider about disease prevention and staying healthy. In between checkups, there are plenty of things you can do on your own. Experts have done a lot of research about which lifestyle changes and preventive measures are most likely to keep you healthy. Ask your health care provider for more information. WEIGHT AND DIET  Eat a healthy diet  Be sure to include plenty of vegetables, fruits, low-fat dairy products, and lean protein.  Do not eat a lot of foods high in solid fats, added sugars, or salt.  Get regular exercise. This is one of the most important things you can do for your health.  Most adults should exercise for at least 150 minutes each week. The exercise should increase your heart rate and make you sweat (moderate-intensity exercise).  Most adults should also do strengthening exercises at least twice a week. This is in addition to the moderate-intensity exercise.  Maintain a healthy weight  Body mass index (BMI) is a measurement that can be used to identify possible weight problems. It estimates body fat based on height and weight. Your health care provider can help determine your BMI and help you achieve or maintain a healthy weight.  For females 52 years of age and older:   A BMI below 18.5 is considered underweight.  A BMI of 18.5 to 24.9 is normal.  A BMI of 25 to 29.9 is considered overweight.  A BMI of 30 and above is considered obese.  Watch levels of cholesterol and blood lipids  You  should start having your blood tested for lipids and cholesterol at 54 years of age, then have this test every 5 years.  You may need to have your cholesterol levels checked more often if:  Your lipid or cholesterol levels are high.  You are older than 53 years of age.  You are at high risk for heart disease.  CANCER SCREENING   Lung Cancer  Lung cancer screening is recommended for adults 16-50 years old who are at high risk for lung cancer because of a history of smoking.  A yearly low-dose CT scan of the lungs is recommended for people who:  Currently smoke.  Have quit within the past 15 years.  Have at least a 30-pack-year history of smoking. A pack year is smoking an average of one pack of cigarettes a day for 1 year.  Yearly screening should continue until it has been 15 years since you quit.  Yearly screening should stop if you develop a health problem that would prevent you from having lung cancer treatment.  Breast Cancer  Practice breast self-awareness. This means understanding how your breasts normally appear and feel.  It also means doing regular breast self-exams. Let your health care provider know about any changes, no matter how small.  If you are in your 20s or 30s, you should have a clinical breast exam (CBE) by a health care provider every 1-3 years as part of a regular health exam.  If you  are 56 or older, have a CBE every year. Also consider having a breast X-ray (mammogram) every year.  If you have a family history of breast cancer, talk to your health care provider about genetic screening.  If you are at high risk for breast cancer, talk to your health care provider about having an MRI and a mammogram every year.  Breast cancer gene (BRCA) assessment is recommended for women who have family members with BRCA-related cancers. BRCA-related cancers include:  Breast.  Ovarian.  Tubal.  Peritoneal cancers.  Results of the assessment will determine  the need for genetic counseling and BRCA1 and BRCA2 testing. Cervical Cancer Your health care provider may recommend that you be screened regularly for cancer of the pelvic organs (ovaries, uterus, and vagina). This screening involves a pelvic examination, including checking for microscopic changes to the surface of your cervix (Pap test). You may be encouraged to have this screening done every 3 years, beginning at age 60.  For women ages 29-65, health care providers may recommend pelvic exams and Pap testing every 3 years, or they may recommend the Pap and pelvic exam, combined with testing for human papilloma virus (HPV), every 5 years. Some types of HPV increase your risk of cervical cancer. Testing for HPV may also be done on women of any age with unclear Pap test results.  Other health care providers may not recommend any screening for nonpregnant women who are considered low risk for pelvic cancer and who do not have symptoms. Ask your health care provider if a screening pelvic exam is right for you.  If you have had past treatment for cervical cancer or a condition that could lead to cancer, you need Pap tests and screening for cancer for at least 20 years after your treatment. If Pap tests have been discontinued, your risk factors (such as having a new sexual partner) need to be reassessed to determine if screening should resume. Some women have medical problems that increase the chance of getting cervical cancer. In these cases, your health care provider may recommend more frequent screening and Pap tests. Colorectal Cancer  This type of cancer can be detected and often prevented.  Routine colorectal cancer screening usually begins at 54 years of age and continues through 54 years of age.  Your health care provider may recommend screening at an earlier age if you have risk factors for colon cancer.  Your health care provider may also recommend using home test kits to check for hidden blood  in the stool.  A small camera at the end of a tube can be used to examine your colon directly (sigmoidoscopy or colonoscopy). This is done to check for the earliest forms of colorectal cancer.  Routine screening usually begins at age 83.  Direct examination of the colon should be repeated every 5-10 years through 54 years of age. However, you may need to be screened more often if early forms of precancerous polyps or small growths are found. Skin Cancer  Check your skin from head to toe regularly.  Tell your health care provider about any new moles or changes in moles, especially if there is a change in a mole's shape or color.  Also tell your health care provider if you have a mole that is larger than the size of a pencil eraser.  Always use sunscreen. Apply sunscreen liberally and repeatedly throughout the day.  Protect yourself by wearing long sleeves, pants, a wide-brimmed hat, and sunglasses whenever you are outside.  HEART DISEASE, DIABETES, AND HIGH BLOOD PRESSURE   High blood pressure causes heart disease and increases the risk of stroke. High blood pressure is more likely to develop in:  People who have blood pressure in the high end of the normal range (130-139/85-89 mm Hg).  People who are overweight or obese.  People who are African American.  If you are 18-39 years of age, have your blood pressure checked every 3-5 years. If you are 40 years of age or older, have your blood pressure checked every year. You should have your blood pressure measured twice--once when you are at a hospital or clinic, and once when you are not at a hospital or clinic. Record the average of the two measurements. To check your blood pressure when you are not at a hospital or clinic, you can use:  An automated blood pressure machine at a pharmacy.  A home blood pressure monitor.  If you are between 55 years and 79 years old, ask your health care provider if you should take aspirin to prevent  strokes.  Have regular diabetes screenings. This involves taking a blood sample to check your fasting blood sugar level.  If you are at a normal weight and have a low risk for diabetes, have this test once every three years after 54 years of age.  If you are overweight and have a high risk for diabetes, consider being tested at a younger age or more often. PREVENTING INFECTION  Hepatitis B  If you have a higher risk for hepatitis B, you should be screened for this virus. You are considered at high risk for hepatitis B if:  You were born in a country where hepatitis B is common. Ask your health care provider which countries are considered high risk.  Your parents were born in a high-risk country, and you have not been immunized against hepatitis B (hepatitis B vaccine).  You have HIV or AIDS.  You use needles to inject street drugs.  You live with someone who has hepatitis B.  You have had sex with someone who has hepatitis B.  You get hemodialysis treatment.  You take certain medicines for conditions, including cancer, organ transplantation, and autoimmune conditions. Hepatitis C  Blood testing is recommended for:  Everyone born from 1945 through 1965.  Anyone with known risk factors for hepatitis C. Sexually transmitted infections (STIs)  You should be screened for sexually transmitted infections (STIs) including gonorrhea and chlamydia if:  You are sexually active and are younger than 54 years of age.  You are older than 54 years of age and your health care provider tells you that you are at risk for this type of infection.  Your sexual activity has changed since you were last screened and you are at an increased risk for chlamydia or gonorrhea. Ask your health care provider if you are at risk.  If you do not have HIV, but are at risk, it may be recommended that you take a prescription medicine daily to prevent HIV infection. This is called pre-exposure prophylaxis  (PrEP). You are considered at risk if:  You are sexually active and do not regularly use condoms or know the HIV status of your partner(s).  You take drugs by injection.  You are sexually active with a partner who has HIV. Talk with your health care provider about whether you are at high risk of being infected with HIV. If you choose to begin PrEP, you should first be tested for HIV. You   should then be tested every 3 months for as long as you are taking PrEP.  PREGNANCY   If you are premenopausal and you may become pregnant, ask your health care provider about preconception counseling.  If you may become pregnant, take 400 to 800 micrograms (mcg) of folic acid every day.  If you want to prevent pregnancy, talk to your health care provider about birth control (contraception). OSTEOPOROSIS AND MENOPAUSE   Osteoporosis is a disease in which the bones lose minerals and strength with aging. This can result in serious bone fractures. Your risk for osteoporosis can be identified using a bone density scan.  If you are 65 years of age or older, or if you are at risk for osteoporosis and fractures, ask your health care provider if you should be screened.  Ask your health care provider whether you should take a calcium or vitamin D supplement to lower your risk for osteoporosis.  Menopause may have certain physical symptoms and risks.  Hormone replacement therapy may reduce some of these symptoms and risks. Talk to your health care provider about whether hormone replacement therapy is right for you.  HOME CARE INSTRUCTIONS   Schedule regular health, dental, and eye exams.  Stay current with your immunizations.   Do not use any tobacco products including cigarettes, chewing tobacco, or electronic cigarettes.  If you are pregnant, do not drink alcohol.  If you are breastfeeding, limit how much and how often you drink alcohol.  Limit alcohol intake to no more than 1 drink per day for  nonpregnant women. One drink equals 12 ounces of beer, 5 ounces of wine, or 1 ounces of hard liquor.  Do not use street drugs.  Do not share needles.  Ask your health care provider for help if you need support or information about quitting drugs.  Tell your health care provider if you often feel depressed.  Tell your health care provider if you have ever been abused or do not feel safe at home.   This information is not intended to replace advice given to you by your health care provider. Make sure you discuss any questions you have with your health care provider.   Document Released: 12/16/2010 Document Revised: 06/23/2014 Document Reviewed: 05/04/2013 Elsevier Interactive Patient Education 2016 [employer]  

## 2016-04-01 NOTE — Assessment & Plan Note (Signed)
S/p hip replacement.

## 2016-04-01 NOTE — Progress Notes (Signed)
Pre visit review using our clinic review tool, if applicable. No additional management support is needed unless otherwise documented below in the visit note. 

## 2016-04-01 NOTE — Assessment & Plan Note (Signed)
Resolved after iron infusion.

## 2016-04-01 NOTE — Progress Notes (Signed)
BP 122/78   Pulse 60   Temp 98.4 F (36.9 C) (Oral)   Ht 5\' 8"  (1.727 m)   Wt 233 lb 8 oz (105.9 kg)   SpO2 97%   BMI 35.50 kg/m    CC: CPE Subjective:    Patient ID: Claire Strong, female    DOB: 05-18-1961, 55 y.o.   MRN: [id number]  HPI: Claire Strong is a 55 y.o. female presenting on 04/01/2016 for Annual Exam   S/p Roux en y gastric bypass 2006.   4 wks ago broke 2 ribs with fall. This is better.   1 mo h/o sinus congestion with rhinorrhea "got into dust" with productive cough over last 1 week. R earache and neck and jaw pain. Feeling ache substernally associated with some shortness of breath and wheezing. Some headaches. Denies fevers/chills, ST or drainage. So far taking tylenol.   Preventative: COLONOSCOPY Date: 05/2012 hyperplastic polyps x2, mild diverticulosis Deatra Ina) rpt 10 yrs Mammogram 08/2015 WNL Last pap smear 2009, always normal. S/p hysterectomy for uterine prolapse 1989. Ovaries remain. No pelvic pain. Declines pelvic exam.  Flu shot declines due to illness today Tdap 12/19/2010.  Seat belt use discussed. Sunscreen use discussed. No changing moles on skin.   Caffeine: 1 cup coffee/day  Lives with husband Gwyndolyn Saxon ([place])), youngest daughter. 1 dog. 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 Denied for disability x3. Activity: taking care of grandson, walking 1-2 mi daily  Diet: good water, fruits/vegetables daily   Relevant past medical, surgical, family and social history reviewed and updated as indicated. Interim medical history since our last visit reviewed. Allergies and medications reviewed and updated. Current Outpatient Prescriptions on File Prior to Visit  Medication Sig  . ACETAMINOPHEN-BUTALBITAL 50-325 MG TABS Take 1 tablet by mouth at bedtime. Reported on 09/03/2015 3 times a week  . albuterol (PROVENTIL HFA;VENTOLIN HFA) 108 (90 BASE) MCG/ACT inhaler Inhale 2 puffs into the lungs every 6 (six) hours as needed.    Marland Kitchen aspirin 81 MG tablet Take 81 mg by mouth daily.  . benazepril (LOTENSIN) 10 MG tablet TAKE ONE TABLET BY MOUTH ONCE DAILY (Patient taking differently: TAKE ONE TABLET BY MOUTH ONCE DAILY qhs)  . carisoprodol (SOMA) 350 MG tablet Take 1 tablet (350 mg total) by mouth at bedtime.  . cetirizine (ZYRTEC) 10 MG tablet Take 10 mg by mouth daily.  . Cholecalciferol (VITAMIN D3) 1000 UNITS tablet Take 2,000 Units by mouth daily.   . citalopram (CELEXA) 40 MG tablet TAKE ONE TABLET BY MOUTH ONCE DAILY  . diclofenac sodium (VOLTAREN) 1 % GEL Apply 1 application topically 3 (three) times daily. (Patient taking differently: Apply 1 application topically 3 (three) times daily as needed. )  . EPINEPHrine (EPI-PEN) 0.3 mg/0.3 mL DEVI Inject 0.3 mLs (0.3 mg total) into the muscle once. (Patient taking differently: Inject 0.3 mg into the muscle as needed. PRN surgery)  . Fluticasone-Salmeterol (ADVAIR DISKUS) 100-50 MCG/DOSE AEPB Inhale 1 puff into the lungs every 12 (twelve) hours. (Patient taking differently: Inhale 1 puff into the lungs 2 (two) times daily as needed. )  . furosemide (LASIX) 20 MG tablet TAKE ONE TABLET BY MOUTH ONCE DAILY  . gabapentin (NEURONTIN) 300 MG capsule Take 300 mg by mouth 3 (three) times a week.   . levothyroxine (SYNTHROID, LEVOTHROID) 150 MCG tablet Take 150 mcg by mouth daily before breakfast.  . metoprolol tartrate (LOPRESSOR) 25 MG tablet Take 1 tablet (25 mg total) by mouth 2 (two)  times daily.  . montelukast (SINGULAIR) 10 MG tablet Take 10 mg by mouth every morning.   . ondansetron (ZOFRAN) 4 MG tablet Take 1 tablet (4 mg total) by mouth every 6 (six) hours as needed for nausea.  Marland Kitchen scopolamine (TRANSDERM-SCOP) 1 MG/3DAYS Place 1 patch (1.5 mg total) onto the skin every 3 (three) days.  . traZODone (DESYREL) 50 MG tablet TAKE ONE-HALF TO ONE TABLET BY MOUTH AT BEDTIME AS NEEDED FOR SLEEP  . vitamin B-12 (CYANOCOBALAMIN) 500 MCG tablet Take 500 mcg by mouth daily.  Marland Kitchen  enoxaparin (LOVENOX) 40 MG/0.4ML injection Inject 0.4 mLs (40 mg total) into the skin daily. (Patient not taking: Reported on 04/01/2016)  . HYDROcodone-acetaminophen (NORCO) 7.5-325 MG tablet Take 1-2 tablets by mouth every 4 (four) hours as needed (breakthrough pain). (Patient not taking: Reported on 04/01/2016)  . traMADol (ULTRAM) 50 MG tablet Take 1 tablet (50 mg total) by mouth every 6 (six) hours as needed for moderate pain. (Patient not taking: Reported on 04/01/2016)   No current facility-administered medications on file prior to visit.     Review of Systems  Constitutional: Negative for activity change, appetite change, chills, fatigue, fever and unexpected weight change.  HENT: Positive for congestion, rhinorrhea, sneezing and sore throat. Negative for hearing loss.   Eyes: Negative for visual disturbance.  Respiratory: Positive for cough, chest tightness, shortness of breath and wheezing.   Cardiovascular: Negative for chest pain, palpitations and leg swelling.  Gastrointestinal: Negative for abdominal distention, abdominal pain, blood in stool, constipation, diarrhea, nausea and vomiting.  Genitourinary: Negative for difficulty urinating and hematuria.  Musculoskeletal: Negative for arthralgias, myalgias and neck pain.  Skin: Negative for rash.  Neurological: Positive for headaches. Negative for dizziness, seizures and syncope.  Hematological: Negative for adenopathy. Bruises/bleeds easily.  Psychiatric/Behavioral: Negative for dysphoric mood. The patient is not nervous/anxious.    Per HPI unless specifically indicated in ROS section     Objective:    BP 122/78   Pulse 60   Temp 98.4 F (36.9 C) (Oral)   Ht 5\' 8"  (1.727 m)   Wt 233 lb 8 oz (105.9 kg)   SpO2 97%   BMI 35.50 kg/m   Wt Readings from Last 3 Encounters:  04/01/16 233 lb 8 oz (105.9 kg)  03/12/16 235 lb 8 oz (106.8 kg)  02/08/16 230 lb 8 oz (104.6 kg)    Physical Exam  Constitutional: She is oriented  to person, place, and time. She appears well-developed and well-nourished. No distress.  HENT:  Head: Normocephalic and atraumatic.  Right Ear: Hearing, tympanic membrane, external ear and ear canal normal.  Left Ear: Hearing, tympanic membrane, external ear and ear canal normal.  Nose: Rhinorrhea present. No mucosal edema.  Mouth/Throat: Uvula is midline and mucous membranes are normal. Posterior oropharyngeal erythema present. No oropharyngeal exudate or posterior oropharyngeal edema.  Septal perforation present  Eyes: Conjunctivae and EOM are normal. Pupils are equal, round, and reactive to light. No scleral icterus.  Neck: Normal range of motion. Neck supple. No thyromegaly present.  Cardiovascular: Normal rate, regular rhythm, normal heart sounds and intact distal pulses.   No murmur heard. Pulses:      Radial pulses are 2+ on the right side, and 2+ on the left side.  Pulmonary/Chest: Effort normal and breath sounds normal. No respiratory distress. She has no wheezes. She has no rales.  Lungs clear but harsh cough present  Abdominal: Soft. Bowel sounds are normal. She exhibits no distension and no mass.  There is no tenderness. There is no rebound and no guarding.  Genitourinary:  Genitourinary Comments: declines  Musculoskeletal: Normal range of motion. She exhibits no edema.  Lymphadenopathy:    She has no cervical adenopathy.  Neurological: She is alert and oriented to person, place, and time.  CN grossly intact, station and gait intact  Skin: Skin is warm and dry. No rash noted.  Psychiatric: She has a normal mood and affect. Her behavior is normal. Judgment and thought content normal.  Nursing note and vitals reviewed.  Results for orders placed or performed in visit on 03/25/16  Lipid panel  Result Value Ref Range   Cholesterol 200 0 - 200 mg/dL   Triglycerides 718.5 (H) 0.0 - 149.0 mg/dL   HDL 41.53 >16.03 mg/dL   VLDL 80.3 0.0 - 05.6 mg/dL   LDL Cholesterol 549 (H) 0 -  99 mg/dL   Total CHOL/HDL Ratio 5    NonHDL 160.54   Comprehensive metabolic panel  Result Value Ref Range   Sodium 137 135 - 145 mEq/L   Potassium 4.0 3.5 - 5.1 mEq/L   Chloride 102 96 - 112 mEq/L   CO2 27 19 - 32 mEq/L   Glucose, Bld 97 70 - 99 mg/dL   BUN 17 6 - 23 mg/dL   Creatinine, Ser 9.67 0.40 - 1.20 mg/dL   Total Bilirubin 0.6 0.2 - 1.2 mg/dL   Alkaline Phosphatase 157 (H) 39 - 117 U/L   AST 16 0 - 37 U/L   ALT 18 0 - 35 U/L   Total Protein 6.6 6.0 - 8.3 g/dL   Albumin 3.9 3.5 - 5.2 g/dL   Calcium 9.1 8.4 - 98.0 mg/dL   GFR 91.38 (L) >39.19 mL/min  CBC with Differential/Platelet  Result Value Ref Range   WBC 4.4 4.0 - 10.5 K/uL   RBC 4.72 3.87 - 5.11 Mil/uL   Hemoglobin 14.0 12.0 - 15.0 g/dL   HCT 73.7 62.2 - 33.1 %   MCV 87.7 78.0 - 100.0 fl   MCHC 33.9 30.0 - 36.0 g/dL   RDW 74.2 13.2 - 13.9 %   Platelets 223.0 150.0 - 400.0 K/uL   Neutrophils Relative % 55.0 43.0 - 77.0 %   Lymphocytes Relative 35.8 12.0 - 46.0 %   Monocytes Relative 6.1 3.0 - 12.0 %   Eosinophils Relative 2.5 0.0 - 5.0 %   Basophils Relative 0.6 0.0 - 3.0 %   Neutro Abs 2.4 1.4 - 7.7 K/uL   Lymphs Abs 1.6 0.7 - 4.0 K/uL   Monocytes Absolute 0.3 0.1 - 1.0 K/uL   Eosinophils Absolute 0.1 0.0 - 0.7 K/uL   Basophils Absolute 0.0 0.0 - 0.1 K/uL  Ferritin  Result Value Ref Range   Ferritin 43.7 10.0 - 291.0 ng/mL  IBC panel  Result Value Ref Range   Iron 113 42 - 145 ug/dL   Transferrin 438.4 808.5 - 360.0 mg/dL   Saturation Ratios 31.8 20.0 - 50.0 %  Sedimentation rate  Result Value Ref Range   Sed Rate 7 0 - 30 mm/hr      Assessment & Plan:   Problem List Items Addressed This Visit    Acute bronchitis    Treat given duration of symptoms with zpack course. Tessalon perls for cough.  Pt requests diflucan Rx given h/o recurrent yeast infections after all antibiotics. Discussed interaction between zpack, diflucan and celexa - pt aware to wait 1 week between diflucan and zpack  administration.  Atrial tachycardia ([place])    Well controlled with metoprolol 25mg  bid.       Depression    Chronic, stable on high dose celexa and trazodone nightly.      Fibromyalgia   Healthcare maintenance - Primary    Preventative protocols reviewed and updated unless pt declined. Discussed healthy diet and lifestyle.       HTN (hypertension)    Chronic, stable. Continue current regimen.      Hypothyroidism, postsurgical    Stable on levothyroxine 163mcg daily.       Iron deficiency anemia    Resolved after iron infusion.       Lupus anticoagulant disorder (H[facility])    Continue aspirin daily. Receives lovenox injections around surgeries.       Nasal septal perforation    Followed by ENT      Osteoarthritis    S/p hip replacement.       S/P gastric bypass    Check vitamin levels next visit. Iron levels stable after iron infusion x2. Encouraged iron rich diet. Pt cannot tolerate oral iron.       Other Visit Diagnoses   None.      Follow up plan: Return in about 1 year (around 04/01/2017) for annual exam, prior fasting for blood work.  Ria Bush, MD

## 2016-04-01 NOTE — Assessment & Plan Note (Signed)
Well controlled with metoprolol 25mg  bid.

## 2016-04-02 DIAGNOSIS — M545 Low back pain: Secondary | ICD-10-CM | POA: Diagnosis not present

## 2016-04-03 DIAGNOSIS — M545 Low back pain: Secondary | ICD-10-CM | POA: Diagnosis not present

## 2016-04-04 ENCOUNTER — Encounter: Payer: Self-pay | Admitting: Family Medicine

## 2016-04-07 DIAGNOSIS — M545 Low back pain: Secondary | ICD-10-CM | POA: Diagnosis not present

## 2016-04-09 DIAGNOSIS — M545 Low back pain: Secondary | ICD-10-CM | POA: Diagnosis not present

## 2016-04-14 DIAGNOSIS — M545 Low back pain: Secondary | ICD-10-CM | POA: Diagnosis not present

## 2016-04-15 DIAGNOSIS — M659 Synovitis and tenosynovitis, unspecified: Secondary | ICD-10-CM | POA: Diagnosis not present

## 2016-04-15 DIAGNOSIS — M25572 Pain in left ankle and joints of left foot: Secondary | ICD-10-CM | POA: Diagnosis not present

## 2016-04-16 DIAGNOSIS — M545 Low back pain: Secondary | ICD-10-CM | POA: Diagnosis not present

## 2016-04-21 DIAGNOSIS — M545 Low back pain: Secondary | ICD-10-CM | POA: Diagnosis not present

## 2016-04-29 ENCOUNTER — Telehealth: Payer: Self-pay | Admitting: Family Medicine

## 2016-04-29 MED ORDER — AZITHROMYCIN 250 MG PO TABS: ORAL_TABLET | ORAL | 0 refills | Status: DC

## 2016-04-29 MED ORDER — FLUCONAZOLE 150 MG PO TABS: 150.0000 mg | ORAL_TABLET | Freq: Every day | ORAL | 0 refills | Status: DC

## 2016-04-29 NOTE — Telephone Encounter (Signed)
Pt has finished zpac. Still feeling horrible and coughing. Pt requesting another refill on zpac and will also need medicine refill for yeast infection. Please advise.   [facility]

## 2016-04-29 NOTE — Telephone Encounter (Signed)
zpack refilled and diflucan refilled. Space zpack 1 wk out from [employer] administration

## 2016-04-30 ENCOUNTER — Encounter: Payer: Self-pay | Admitting: *Deleted

## 2016-04-30 DIAGNOSIS — M545 Low back pain: Secondary | ICD-10-CM | POA: Diagnosis not present

## 2016-04-30 NOTE — Telephone Encounter (Signed)
Attempted to call patient. Mailbox was full. Could not leave message. Notified via Mychart.

## 2016-04-30 NOTE — Telephone Encounter (Signed)
See Mychart message. Patient said she needs something other than the zpack. She said it didn't work and doesn't understand why it was refilled. Also her inhalers aren't working well.

## 2016-05-01 MED ORDER — DOXYCYCLINE HYCLATE 100 MG PO TABS: 100.0000 mg | ORAL_TABLET | Freq: Two times a day (BID) | ORAL | 0 refills | Status: DC

## 2016-05-01 NOTE — Telephone Encounter (Signed)
doxy sent in in place of zpack. I also asked her to schedule f/u in office of not improved, or if any trouble breathing.

## 2016-05-01 NOTE — Telephone Encounter (Signed)
Patient called and said she checked with the pharmacy and nothing has been called in.  Please call patient at [phone].

## 2016-05-05 DIAGNOSIS — M545 Low back pain: Secondary | ICD-10-CM | POA: Diagnosis not present

## 2016-05-13 DIAGNOSIS — M5416 Radiculopathy, lumbar region: Secondary | ICD-10-CM | POA: Diagnosis not present

## 2016-05-13 DIAGNOSIS — M5412 Radiculopathy, cervical region: Secondary | ICD-10-CM | POA: Diagnosis not present

## 2016-05-13 DIAGNOSIS — M5030 Other cervical disc degeneration, unspecified cervical region: Secondary | ICD-10-CM | POA: Diagnosis not present

## 2016-05-13 DIAGNOSIS — M5136 Other intervertebral disc degeneration, lumbar region: Secondary | ICD-10-CM | POA: Diagnosis not present

## 2016-05-14 DIAGNOSIS — M659 Synovitis and tenosynovitis, unspecified: Secondary | ICD-10-CM | POA: Diagnosis not present

## 2016-05-14 DIAGNOSIS — M25372 Other instability, left ankle: Secondary | ICD-10-CM | POA: Diagnosis not present

## 2016-05-14 DIAGNOSIS — M25572 Pain in left ankle and joints of left foot: Secondary | ICD-10-CM | POA: Diagnosis not present

## 2016-05-21 ENCOUNTER — Other Ambulatory Visit: Payer: Self-pay | Admitting: Family Medicine

## 2016-05-21 ENCOUNTER — Other Ambulatory Visit: Payer: Self-pay | Admitting: *Deleted

## 2016-05-21 MED ORDER — METOPROLOL TARTRATE 25 MG PO TABS
25.0000 mg | ORAL_TABLET | Freq: Two times a day (BID) | ORAL | 1 refills | Status: DC
Start: 2016-05-21 — End: 2016-11-21

## 2016-05-21 NOTE — Telephone Encounter (Signed)
Ok to refill 90, 1 ref 

## 2016-05-21 NOTE — Telephone Encounter (Signed)
Called in as directed.

## 2016-05-21 NOTE — Telephone Encounter (Signed)
Ok to refill in Dr. Timoteo Expose absence? Last filled 11/27/15 #90 1RF. Please send back to me. Thanks

## 2016-05-28 DIAGNOSIS — M4802 Spinal stenosis, cervical region: Secondary | ICD-10-CM | POA: Diagnosis not present

## 2016-05-28 DIAGNOSIS — M5030 Other cervical disc degeneration, unspecified cervical region: Secondary | ICD-10-CM | POA: Diagnosis not present

## 2016-05-28 DIAGNOSIS — M5412 Radiculopathy, cervical region: Secondary | ICD-10-CM | POA: Diagnosis not present

## 2016-06-12 DIAGNOSIS — M542 Cervicalgia: Secondary | ICD-10-CM | POA: Diagnosis not present

## 2016-06-12 DIAGNOSIS — M5412 Radiculopathy, cervical region: Secondary | ICD-10-CM | POA: Diagnosis not present

## 2016-06-18 ENCOUNTER — Other Ambulatory Visit: Payer: Self-pay | Admitting: Family Medicine

## 2016-06-18 DIAGNOSIS — M542 Cervicalgia: Secondary | ICD-10-CM | POA: Diagnosis not present

## 2016-06-18 DIAGNOSIS — M5412 Radiculopathy, cervical region: Secondary | ICD-10-CM | POA: Diagnosis not present

## 2016-06-19 ENCOUNTER — Telehealth: Payer: Self-pay | Admitting: Cardiovascular Disease

## 2016-06-19 NOTE — Telephone Encounter (Signed)
Received records request from [employer] , forwarded to [employer] for processing. ° °

## 2016-06-20 DIAGNOSIS — M542 Cervicalgia: Secondary | ICD-10-CM | POA: Diagnosis not present

## 2016-06-20 DIAGNOSIS — M5412 Radiculopathy, cervical region: Secondary | ICD-10-CM | POA: Diagnosis not present

## 2016-06-25 DIAGNOSIS — L03031 Cellulitis of right toe: Secondary | ICD-10-CM | POA: Diagnosis not present

## 2016-06-25 DIAGNOSIS — M659 Synovitis and tenosynovitis, unspecified: Secondary | ICD-10-CM | POA: Diagnosis not present

## 2016-06-25 DIAGNOSIS — L03032 Cellulitis of left toe: Secondary | ICD-10-CM | POA: Diagnosis not present

## 2016-06-27 DIAGNOSIS — M542 Cervicalgia: Secondary | ICD-10-CM | POA: Diagnosis not present

## 2016-06-27 DIAGNOSIS — M5412 Radiculopathy, cervical region: Secondary | ICD-10-CM | POA: Diagnosis not present

## 2016-07-09 DIAGNOSIS — L03032 Cellulitis of left toe: Secondary | ICD-10-CM | POA: Diagnosis not present

## 2016-07-09 DIAGNOSIS — L03031 Cellulitis of right toe: Secondary | ICD-10-CM | POA: Diagnosis not present

## 2016-07-09 DIAGNOSIS — M25572 Pain in left ankle and joints of left foot: Secondary | ICD-10-CM | POA: Diagnosis not present

## 2016-07-11 DIAGNOSIS — M5412 Radiculopathy, cervical region: Secondary | ICD-10-CM | POA: Diagnosis not present

## 2016-07-11 DIAGNOSIS — M542 Cervicalgia: Secondary | ICD-10-CM | POA: Diagnosis not present

## 2016-07-15 DIAGNOSIS — M47814 Spondylosis without myelopathy or radiculopathy, thoracic region: Secondary | ICD-10-CM | POA: Diagnosis not present

## 2016-07-15 DIAGNOSIS — M5412 Radiculopathy, cervical region: Secondary | ICD-10-CM | POA: Diagnosis not present

## 2016-07-15 DIAGNOSIS — M5030 Other cervical disc degeneration, unspecified cervical region: Secondary | ICD-10-CM | POA: Diagnosis not present

## 2016-07-15 DIAGNOSIS — M62830 Muscle spasm of back: Secondary | ICD-10-CM | POA: Diagnosis not present

## 2016-07-18 ENCOUNTER — Other Ambulatory Visit: Payer: Self-pay | Admitting: Family Medicine

## 2016-07-18 DIAGNOSIS — M62830 Muscle spasm of back: Secondary | ICD-10-CM | POA: Diagnosis not present

## 2016-07-18 DIAGNOSIS — M47814 Spondylosis without myelopathy or radiculopathy, thoracic region: Secondary | ICD-10-CM | POA: Diagnosis not present

## 2016-07-22 ENCOUNTER — Other Ambulatory Visit: Payer: Self-pay | Admitting: *Deleted

## 2016-07-22 MED ORDER — GABAPENTIN 300 MG PO CAPS: 300.0000 mg | ORAL_CAPSULE | ORAL | 11 refills | Status: DC

## 2016-07-23 DIAGNOSIS — M47814 Spondylosis without myelopathy or radiculopathy, thoracic region: Secondary | ICD-10-CM | POA: Diagnosis not present

## 2016-07-23 DIAGNOSIS — M62830 Muscle spasm of back: Secondary | ICD-10-CM | POA: Diagnosis not present

## 2016-07-28 DIAGNOSIS — M47814 Spondylosis without myelopathy or radiculopathy, thoracic region: Secondary | ICD-10-CM | POA: Diagnosis not present

## 2016-07-28 DIAGNOSIS — M62830 Muscle spasm of back: Secondary | ICD-10-CM | POA: Diagnosis not present

## 2016-07-29 ENCOUNTER — Encounter: Payer: Self-pay | Admitting: Family Medicine

## 2016-07-29 MED ORDER — FLUTICASONE-SALMETEROL 100-50 MCG/DOSE IN AEPB: 1.0000 | INHALATION_SPRAY | Freq: Two times a day (BID) | RESPIRATORY_TRACT | 3 refills | Status: DC

## 2016-07-30 DIAGNOSIS — M62830 Muscle spasm of back: Secondary | ICD-10-CM | POA: Diagnosis not present

## 2016-07-30 DIAGNOSIS — M47814 Spondylosis without myelopathy or radiculopathy, thoracic region: Secondary | ICD-10-CM | POA: Diagnosis not present

## 2016-08-05 DIAGNOSIS — M47814 Spondylosis without myelopathy or radiculopathy, thoracic region: Secondary | ICD-10-CM | POA: Diagnosis not present

## 2016-08-05 DIAGNOSIS — M62830 Muscle spasm of back: Secondary | ICD-10-CM | POA: Diagnosis not present

## 2016-08-08 DIAGNOSIS — M62830 Muscle spasm of back: Secondary | ICD-10-CM | POA: Diagnosis not present

## 2016-08-08 DIAGNOSIS — M47814 Spondylosis without myelopathy or radiculopathy, thoracic region: Secondary | ICD-10-CM | POA: Diagnosis not present

## 2016-08-13 ENCOUNTER — Other Ambulatory Visit: Payer: Self-pay | Admitting: Podiatry

## 2016-08-13 DIAGNOSIS — G8929 Other chronic pain: Secondary | ICD-10-CM

## 2016-08-13 DIAGNOSIS — M25572 Pain in left ankle and joints of left foot: Principal | ICD-10-CM

## 2016-08-15 ENCOUNTER — Ambulatory Visit
Admission: RE | Admit: 2016-08-15 | Discharge: 2016-08-15 | Disposition: A | Discharge status: Home / Self Care | Payer: BLUE CROSS/BLUE SHIELD | Source: Ambulatory Visit | Attending: Podiatry | Admitting: Podiatry

## 2016-08-15 DIAGNOSIS — M7989 Other specified soft tissue disorders: Secondary | ICD-10-CM | POA: Diagnosis not present

## 2016-08-15 DIAGNOSIS — I8392 Asymptomatic varicose veins of left lower extremity: Secondary | ICD-10-CM | POA: Insufficient documentation

## 2016-08-15 DIAGNOSIS — M659 Synovitis and tenosynovitis, unspecified: Secondary | ICD-10-CM | POA: Diagnosis not present

## 2016-08-15 DIAGNOSIS — M62830 Muscle spasm of back: Secondary | ICD-10-CM | POA: Diagnosis not present

## 2016-08-15 DIAGNOSIS — M47814 Spondylosis without myelopathy or radiculopathy, thoracic region: Secondary | ICD-10-CM | POA: Diagnosis not present

## 2016-08-15 DIAGNOSIS — M25572 Pain in left ankle and joints of left foot: Secondary | ICD-10-CM | POA: Diagnosis not present

## 2016-08-15 DIAGNOSIS — M19072 Primary osteoarthritis, left ankle and foot: Secondary | ICD-10-CM | POA: Insufficient documentation

## 2016-08-15 DIAGNOSIS — G8929 Other chronic pain: Secondary | ICD-10-CM

## 2016-08-18 DIAGNOSIS — M19072 Primary osteoarthritis, left ankle and foot: Secondary | ICD-10-CM | POA: Diagnosis not present

## 2016-08-18 DIAGNOSIS — M79672 Pain in left foot: Secondary | ICD-10-CM | POA: Diagnosis not present

## 2016-08-21 ENCOUNTER — Encounter: Payer: Self-pay | Admitting: Family Medicine

## 2016-08-21 ENCOUNTER — Other Ambulatory Visit: Payer: Self-pay | Admitting: Family Medicine

## 2016-08-21 DIAGNOSIS — Z1231 Encounter for screening mammogram for malignant neoplasm of breast: Secondary | ICD-10-CM

## 2016-08-22 ENCOUNTER — Encounter: Payer: Self-pay | Admitting: Family Medicine

## 2016-08-25 ENCOUNTER — Encounter (INDEPENDENT_AMBULATORY_CARE_PROVIDER_SITE_OTHER): Payer: Self-pay | Admitting: Vascular Surgery

## 2016-08-25 ENCOUNTER — Encounter (INDEPENDENT_AMBULATORY_CARE_PROVIDER_SITE_OTHER): Payer: Self-pay

## 2016-08-25 ENCOUNTER — Ambulatory Visit
Admission: RE | Admit: 2016-08-25 | Discharge: 2016-08-25 | Disposition: A | Discharge status: Home / Self Care | Payer: BLUE CROSS/BLUE SHIELD | Source: Ambulatory Visit | Attending: Family Medicine | Admitting: Family Medicine

## 2016-08-25 ENCOUNTER — Other Ambulatory Visit: Payer: Self-pay | Admitting: Family Medicine

## 2016-08-25 ENCOUNTER — Ambulatory Visit (INDEPENDENT_AMBULATORY_CARE_PROVIDER_SITE_OTHER): Payer: BLUE CROSS/BLUE SHIELD | Admitting: Vascular Surgery

## 2016-08-25 VITALS — BP 176/96 | HR 52 | Resp 16 | Ht 69.0 in | Wt 233.0 lb

## 2016-08-25 DIAGNOSIS — M1990 Unspecified osteoarthritis, unspecified site: Secondary | ICD-10-CM

## 2016-08-25 DIAGNOSIS — Z1231 Encounter for screening mammogram for malignant neoplasm of breast: Secondary | ICD-10-CM | POA: Diagnosis not present

## 2016-08-25 DIAGNOSIS — I739 Peripheral vascular disease, unspecified: Secondary | ICD-10-CM | POA: Insufficient documentation

## 2016-08-25 DIAGNOSIS — Z86718 Personal history of other venous thrombosis and embolism: Secondary | ICD-10-CM

## 2016-08-25 DIAGNOSIS — D6862 Lupus anticoagulant syndrome: Secondary | ICD-10-CM | POA: Diagnosis not present

## 2016-08-25 DIAGNOSIS — I872 Venous insufficiency (chronic) (peripheral): Secondary | ICD-10-CM | POA: Insufficient documentation

## 2016-08-25 LAB — HM MAMMOGRAPHY

## 2016-08-25 NOTE — Progress Notes (Signed)
MRN : [id number]  Claire Strong is a 56 y.o. (04-07-1961) female who presents with chief complaint of  Chief Complaint  Patient presents with  . New Evaluation    Talk about IVC filter placement  .  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  DVT was identified years ago and was treated with anticoagulation.  The presenting symptoms were pain and swelling in the lower extremity.  She has had two prior filters.  The patient notes the leg continues to be mildly painful with dependency and still swells quite a bite.  Symptoms are much better with elevation.  The patient notes minimal edema in the morning which steadily worsens throughout the day.    The patient has not been using compression therapy at this point.  No SOB or pleuritic chest pains.  No cough or hemoptysis.  No blood per rectum or blood in any sputum.  No excessive bruising per the patient.    Current Meds  Medication Sig  . ACETAMINOPHEN-BUTALBITAL 50-325 MG TABS Take 1 tablet by mouth at bedtime. Reported on 09/03/2015 3 times a week  . albuterol (PROVENTIL HFA;VENTOLIN HFA) 108 (90 BASE) MCG/ACT inhaler Inhale 2 puffs into the lungs every 6 (six) hours as needed.  Marland Kitchen aspirin 81 MG tablet Take 81 mg by mouth daily.  . benazepril (LOTENSIN) 10 MG tablet TAKE ONE TABLET BY MOUTH ONCE DAILY (Patient taking differently: TAKE ONE TABLET BY MOUTH ONCE DAILY qhs)  . carisoprodol (SOMA) 350 MG tablet TAKE ONE TABLET BY MOUTH AT BEDTIME  . cetirizine (ZYRTEC) 10 MG tablet Take 10 mg by mouth daily.  . Cholecalciferol (VITAMIN D3) 1000 UNITS tablet Take 2,000 Units by mouth daily.   . citalopram (CELEXA) 40 MG tablet TAKE ONE TABLET BY MOUTH ONCE DAILY  . diclofenac sodium (VOLTAREN) 1 % GEL Apply 1 application topically 3 (three) times daily. (Patient taking differently: Apply 1 application topically 3 (three)  times daily as needed. )  . enoxaparin (LOVENOX) 40 MG/0.4ML injection Inject 0.4 mLs (40 mg total) into the skin daily.  Marland Kitchen EPINEPHrine (EPI-PEN) 0.3 mg/0.3 mL DEVI Inject 0.3 mLs (0.3 mg total) into the muscle once. (Patient taking differently: Inject 0.3 mg into the muscle as needed. PRN surgery)  . fluconazole (DIFLUCAN) 150 MG tablet Take 1 tablet (150 mg total) by mouth daily.  . Fluticasone-Salmeterol (ADVAIR DISKUS) 100-50 MCG/DOSE AEPB Inhale 1 puff into the lungs every 12 (twelve) hours.  . furosemide (LASIX) 20 MG tablet TAKE ONE TABLET BY MOUTH ONCE DAILY  . gabapentin (NEURONTIN) 300 MG capsule Take 1 capsule (300 mg total) by mouth 3 (three) times a week.  Marland Kitchen HYDROcodone-acetaminophen (NORCO) 7.5-325 MG tablet Take 1-2 tablets by mouth every 4 (four) hours as needed (breakthrough pain).  Marland Kitchen levothyroxine (SYNTHROID, LEVOTHROID) 150 MCG tablet Take 150 mcg by mouth daily before breakfast.  . metoprolol tartrate (LOPRESSOR) 25 MG tablet Take 1 tablet (25 mg total) by mouth 2 (two) times daily.  . montelukast (SINGULAIR) 10 MG tablet Take 10 mg by mouth every morning.   . ondansetron (ZOFRAN) 4 MG tablet Take 1 tablet (4 mg total) by mouth every 6 (six) hours as needed for nausea.  Marland Kitchen scopolamine (TRANSDERM-SCOP) 1 MG/3DAYS Place 1 patch (1.5 mg total) onto the skin every 3 (three) days.  . traMADol (ULTRAM) 50 MG tablet Take 1 tablet (50 mg total)  by mouth every 6 (six) hours as needed for moderate pain.  . traZODone (DESYREL) 50 MG tablet TAKE ONE-HALF TO ONE TABLET BY MOUTH AT BEDTIME AS NEEDED FOR SLEEP  . vitamin B-12 (CYANOCOBALAMIN) 500 MCG tablet Take 500 mcg by mouth daily.    Past Medical History:  Diagnosis Date  . Bleeding disorder (HCC)   . Bronchial asthma   . Cancer (HCC)    melanoma  . 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 Trula Ore ortho)  . Chronic venous insufficiency 2007   s/p vein stripping and laser ablation  . Depression   .  Diverticulosis    mild by colonoscopy  . Fibromyalgia 02/2014   [place]  . History of DVT (deep vein thrombosis)    DVTs after 1st pregnancy, not on AC 2/2 bleeding ulcer, greenfield filter in place  . History of gastric ulcer 2009  . Horseshoe kidney    sole, R kidney damage  . HTN (hypertension)   . Hypothyroidism   . Kidney stones    s/p renal hematoma after lithotripsy on right  . Lupus anticoagulant disorder (HCC)   . Multinodular goiter    s/p thyroidectomy  . Nasal septal perforation    chronic, ENT rec avoid antihistamine, INS  . Osteoarthritis    ?FM by rheum  . Overweight   . Post-surgical hypothyroidism    for multinodular goiter  . 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 Jacqulyn Ducking  . Seasonal allergies    allergy shots, singulair  . 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 Tachycardia     Past Surgical History:  Procedure Laterality Date  . ABDOMINAL HYSTERECTOMY    . ANKLE FUSION Left 05/2013  . APPENDECTOMY  1978  . BACK SURGERY  2009  . CHOLECYSTECTOMY  1998  . COLONOSCOPY  05/2012   hyperplastic polyps x2, mild diverticulosis Arlyce Dice) rpt 10 yrs  . CYST EXCISION     back  and shoulder  . ESI Right 10/2015, 02/2016   C4/5 then C5/6 ESI ([place])  . ESI Bilateral 06/2015, 08/2015, 03/2016   L4/5 transforaminal ESI ([place])  . GASTRIC BYPASS  05/21/05   Dr. Jacqulyn Ducking, Roux-en-Y  . Greenfield filter removal  2015   removed 6 wks after surgery  . hammerhead toes Left   . HERNIA REPAIR    . lyphoma excision  08/2015   arm  . NASAL SEPTUM SURGERY  1981   deviated septum, [facility] at [facility]  . PARTIAL HYSTERECTOMY  1989   ovaries remain  . SKIN SURGERY     basel cell  . TONSILLECTOMY  1969  . TOTAL HIP ARTHROPLASTY Right 11/06/2015   Procedure: TOTAL HIP ARTHROPLASTY ANTERIOR APPROACH;  Surgeon: Kennedy Bucker, MD;  Location: [facility] ORS;  Service: Orthopedics;  Laterality: Right;  . TOTAL KNEE ARTHROPLASTY Left 08/2013  . TOTAL THYROIDECTOMY  09/2011   benign path  (done for multinodular goiter concern for cancer)    Social History Social History  Substance Use Topics  . Smoking status: Former Smoker    Quit date: 06/16/2004  . Smokeless tobacco: Never Used     Comment: Socially-no longer  . Alcohol use 0.0 oz/week     Comment: Occasionally-wine    Family History Family History  Problem Relation Age of Onset  . Diabetes Mother     prediabetes  . Alzheimer's disease Mother   . Hyperlipidemia Mother   . Diabetes Father   . Coronary  artery disease Father 62    CABG  . Alcohol abuse Father   . Cancer Father     lung  . Cancer Maternal Uncle     brain  . Diabetes Paternal Grandmother   . Stroke Neg Hx   . Colon cancer Neg Hx   . Stomach cancer Neg Hx   . Kidney disease Neg Hx   . Breast cancer Neg Hx   No family history of bleeding/clotting disorders, porphyria or autoimmune disease   Allergies  Allergen Reactions  . Sulfa Antibiotics Shortness Of Breath  . Cefuroxime Axetil Other (See Comments)    Bleeding ulcer  . Nutritional Supplements Swelling    Unsure which  . Oxycodone-Acetaminophen Nausea And Vomiting  . Betadine [Povidone Iodine] Rash     REVIEW OF SYSTEMS (Negative unless checked)  Constitutional: [] Weight loss  [] Fever  [] Chills Cardiac: [] Chest pain   [] Chest pressure   [] Palpitations   [] Shortness of breath when laying flat   [] Shortness of breath with exertion. Vascular:  [] Pain in legs with walking   [] Pain in legs at rest  [x] History of DVT   [] Phlebitis   [x] Swelling in legs   [] Varicose veins   [] Non-healing ulcers Pulmonary:   [] Uses home oxygen   [] Productive cough   [] Hemoptysis   [] Wheeze  [] COPD   [] Asthma Neurologic:  [] Dizziness   [] Seizures   [] History of stroke   [] History of TIA  [] Aphasia   [] Vissual changes   [] Weakness or numbness in arm   [] Weakness or numbness in leg Musculoskeletal:   [x] Joint swelling   [x] Joint pain   [] Low back pain Hematologic:  [] Easy bruising  [] Easy bleeding    [] Hypercoagulable state   [] Anemic Gastrointestinal:  [] Diarrhea   [] Vomiting  [] Gastroesophageal reflux/heartburn   [] Difficulty swallowing. Genitourinary:  [] Chronic kidney disease   [] Difficult urination  [] Frequent urination   [] Blood in urine Skin:  [] Rashes   [] Ulcers  Psychological:  [] History of anxiety   []  History of major depression.  Physical Examination  Vitals:   08/25/16 1051  BP: (!) 176/96  Pulse: (!) 52  Resp: 16  Weight: 233 lb (105.7 kg)  Height: 5\' 9"  (1.753 m)   Body mass index is 34.41 kg/m. Gen: WD/WN, NAD Head: [place]/AT, No temporalis wasting.  Ear/Nose/Throat: Hearing grossly intact, nares w/o erythema or drainage, poor dentition Eyes: PER, EOMI, sclera nonicteric.  Neck: Supple, no masses.  No bruit or JVD.  Pulmonary:  Good air movement, clear to auscultation bilaterally, no use of accessory muscles.  Cardiac: RRR, normal S1, S2, no Murmurs. Vascular: Large varicosities present extensively; greater than 6 mm left greater than right.  Mild venous stasis changes to the legs bilaterally.  2+ soft pitting edema Vessel Right Left  Radial Palpable Palpable  Ulnar Palpable Palpable  Brachial Palpable Palpable  Carotid Palpable Palpable  Femoral Palpable Palpable  Popliteal Palpable Palpable  PT Not Palpable Not Palpable  DP Not Palpable Not Palpable  Gastrointestinal: soft, non-distended. No guarding/no peritoneal signs.  Musculoskeletal: M/S 5/5 throughout.  No deformity or atrophy.  Neurologic: CN 2-12 intact. Pain and light touch intact in extremities.  Symmetrical.  Speech is fluent. Motor exam as listed above. Psychiatric: Judgment intact, Mood & affect appropriate for pt's clinical situation. Dermatologic: No rashes or ulcers noted.  No changes consistent with cellulitis. Lymph : No Cervical lymphadenopathy, no lichenification or skin changes of chronic lymphedema.  CBC Lab Results  Component Value Date   WBC 4.4 03/25/2016   HGB  14.0 03/25/2016     HCT 41.3 03/25/2016   MCV 87.7 03/25/2016   PLT 223.0 03/25/2016    BMET    Component Value Date/Time   NA 137 03/25/2016 0833   NA 132 (L) 08/27/2013 0616   K 4.0 03/25/2016 0833   K 4.5 08/27/2013 0616   CL 102 03/25/2016 0833   CL 102 08/27/2013 0616   CO2 27 03/25/2016 0833   CO2 27 08/27/2013 0616   GLUCOSE 97 03/25/2016 0833   GLUCOSE 107 (H) 08/27/2013 0616   BUN 17 03/25/2016 0833   BUN 12 08/27/2013 0616   CREATININE 1.08 03/25/2016 0833   CREATININE 0.91 08/27/2013 0616   CREATININE 1.02 09/20/2011   CALCIUM 9.1 03/25/2016 0833   CALCIUM 7.4 (L) 08/27/2013 0616   CALCIUM 7.7 09/20/2011   GFRNONAA >60 11/08/2015 0442   GFRNONAA >60 08/27/2013 0616   GFRAA >60 11/08/2015 0442   GFRAA >60 08/27/2013 0616   CrCl cannot be calculated (Patient's most recent lab result is older than the maximum 21 days allowed.).  COAG Lab Results  Component Value Date   INR 1.00 10/31/2015   INR 1.0 06/28/2014   INR 0.9 08/10/2013    Radiology Ct Ankle Left Wo Contrast  Result Date: 08/15/2016 CLINICAL DATA:  History of left ankle surgery December, 2014 with worsening pain, swelling and numbness. EXAM: CT OF THE LEFT ANKLE WITHOUT CONTRAST TECHNIQUE: Multidetector CT imaging of the left ankle was performed according to the standard protocol. Multiplanar CT image reconstructions were also generated. COMPARISON:  04/21/2013 FINDINGS: Bones/Joint/Cartilage Two cannulated screws are noted spanning a fused subtalar joint from a plantar calcaneal approach, terminating in the talar dome and anterior talus. Talar beaking from prior subtalar coalition is noted. Osteoarthritic joint space narrowing and spurring is seen across the talonavicular and calcaneocuboid articulations of the foot, having progressed since prior exam. Coalition between the calcaneus and navicular bone is no longer present. Tiny 3 mm ossification is seen interposed between the navicular bone and calcaneus. Plantar and  dorsal calcaneal are again identified. No acute fracture nor frank bone destruction. The ankle mortise is maintained without joint effusion. Ligaments Suboptimally assessed by CT. Muscles and Tendons Nonacute. Soft tissues Mild diffuse circumferential soft tissue swelling is noted about the visualized ankle. Chronic mild thickening of the plantar fascia on the calcaneus. Varicose veins are seen along the medial aspect of the leg. IMPRESSION: 1. Subtalar joint fusion span by 2 cannulated screws are noted without evidence of acute fracture nor frank bone destruction. Osseous union is demonstrated. 2. Progression of midfoot osteoarthritis across the calcaneal navicular and calcaneal cuboid articulations with further joint space narrowing and spurring. 3. Mild chronic thickening of the plantar fascia near its insertion on the calcaneus. 4. Mild soft tissue swelling of the ankle. 5. Varicose veins along the medial aspect of the ankle. Electronically Signed   By: Tollie Eth M.D.   On: 08/15/2016 13:48   Mm Screening Breast Tomo Bilateral  Result Date: 08/25/2016 CLINICAL DATA:  Screening. EXAM: 2D DIGITAL SCREENING BILATERAL MAMMOGRAM WITH CAD AND ADJUNCT TOMO COMPARISON:  Previous exam(s). ACR Breast Density Category b: There are scattered areas of fibroglandular density. FINDINGS: There are no findings suspicious for malignancy. Images were processed with CAD. IMPRESSION: No mammographic evidence of malignancy. A result letter of this screening mammogram will be mailed directly to the patient. RECOMMENDATION: Screening mammogram in one year. (Code:SM-B-01Y) BI-RADS CATEGORY  1: Negative. Electronically Signed   By: Renard Hamper.D.  On: 08/25/2016 11:42    Assessment/Plan 1. Lupus anticoagulant disorder [facility]) The patient will continue anticoagulation for now as there have not been any problems or complications at this point.  IVC filter is strongly indicated prior to high risk orthopedic surgery.   Especially given the history of PE with the past DVT.  IVC filter placement will be done the week for surgery. Risk and benefits were reviewed the patient.  Indications for the procedure were reviewed.  All questions were answered, the patient agrees to proceed.   I have had a long discussion with the patient regarding DVT and post phlebitic changes such as swelling and why it  causes symptoms such as pain.  The patient will wear graduated compression stockings class 1 (20-30 mmHg) on a daily basis a prescription was given. The patient will  beginning wearing the stockings first thing in the morning and removing them in the evening. The patient is instructed specifically not to sleep in the stockings.  In addition, behavioral modification including elevation during the day and avoidance of prolonged dependency will be initiated.    The patient will follow-up with me in 3 months after the joint replacement surgery to discuss removal (this was also discussed today and the patient agrees with the plan to have the filter removed).   2. History of DVT (deep vein thrombosis) See #1  3. Osteoarthritis, unspecified osteoarthritis type, unspecified site Continue NSAID medications as already ordered, these medications have been reviewed and there are no changes at this time.   4. PAD (peripheral artery disease) (HCC)  Recommend:  The patient has evidence of atherosclerosis of the lower extremities with claudication.  The patient does not voice lifestyle limiting changes at this point in time.  Noninvasive studies do not suggest clinically significant change.  No invasive studies, angiography or surgery at this time The patient should continue walking and begin a more formal exercise program.  The patient should continue antiplatelet therapy and aggressive treatment of the lipid abnormalities  No changes in the patient's medications at this time  The patient should continue wearing graduated  compression socks 10-15 mmHg strength to control the mild edema.   - VAS [place] ABI WITH/WO TBI; Future  5. Chronic venous insufficiency No surgery or intervention at this point in time.    I have had a long discussion with the patient regarding venous insufficiency and why it  causes symptoms. I have discussed with the patient the chronic skin changes that accompany venous insufficiency and the long term sequela such as infection and ulceration.  Patient will begin wearing graduated compression stockings class 1 (20-30 mmHg) or compression wraps on a daily basis a prescription was given. The patient will put the stockings on first thing in the morning and removing them in the evening. The patient is instructed specifically not to sleep in the stockings.    In addition, behavioral modification including several periods of elevation of the lower extremities during the day will be continued. I have demonstrated that proper elevation is a position with the ankles at heart level.  The patient is instructed to begin routine exercise, especially walking on a daily basis  Patient should undergo duplex ultrasound of the venous system to ensure that DVT or reflux is not present.  Following the review of the ultrasound the patient will follow up in 2-3 months to reassess the degree of swelling and the control that graduated compression stockings or compression wraps  is offering.   The  patient can be assessed for a Lymph Pump at that time - VAS [place] LOWER EXTREMITY VENOUS REFLUX; Future    Levora Dredge, MD  08/25/2016 6:03 PM

## 2016-08-26 ENCOUNTER — Encounter: Payer: Self-pay | Admitting: *Deleted

## 2016-08-28 ENCOUNTER — Encounter: Payer: Self-pay | Admitting: Family Medicine

## 2016-08-28 ENCOUNTER — Ambulatory Visit (INDEPENDENT_AMBULATORY_CARE_PROVIDER_SITE_OTHER)
Admission: RE | Admit: 2016-08-28 | Discharge: 2016-08-28 | Disposition: A | Discharge status: Home / Self Care | Payer: BLUE CROSS/BLUE SHIELD | Source: Ambulatory Visit | Attending: Family Medicine | Admitting: Family Medicine

## 2016-08-28 ENCOUNTER — Other Ambulatory Visit: Payer: Self-pay | Admitting: Family Medicine

## 2016-08-28 ENCOUNTER — Ambulatory Visit (INDEPENDENT_AMBULATORY_CARE_PROVIDER_SITE_OTHER): Payer: BLUE CROSS/BLUE SHIELD | Admitting: Family Medicine

## 2016-08-28 VITALS — BP 118/82 | HR 60 | Temp 98.4°F | Wt 229.0 lb

## 2016-08-28 DIAGNOSIS — Z01818 Encounter for other preprocedural examination: Secondary | ICD-10-CM

## 2016-08-28 DIAGNOSIS — Z86718 Personal history of other venous thrombosis and embolism: Secondary | ICD-10-CM | POA: Diagnosis not present

## 2016-08-28 DIAGNOSIS — D6862 Lupus anticoagulant syndrome: Secondary | ICD-10-CM

## 2016-08-28 NOTE — Progress Notes (Signed)
BP 118/82   Pulse 60   Temp 98.4 F (36.9 C) (Oral)   Wt 229 lb (103.9 kg)   BMI 33.82 kg/m    CC: preop evaluation Subjective:    Patient ID: Claire Strong, female    DOB: 11/25/1960, 56 y.o.   MRN: [id number]  HPI: Claire Strong is a 56 y.o. female presenting on 08/28/2016 for Pre-op Exam   Struggling with allergy symptoms.   Upcoming outpatient talonavicular calcaneocuboid fusion by [facility] podiatry Dr Ether Griffins scheduled for 09/12/2016. She wants to avoid narcotic if able due to constipation concerns.   Saw [facility] for lupus anticoagulant history - recommended IVC filter placement 09/09/2016 prior to ankle surgery and regular use of compression stockings. She has upcoming hematology appt with Dr Merlene Pulling to discuss perioperative anticoagulation management in her lupus disorder anticoagulant history.   She has previously had multiple surgeries under [facility] and tolerated well. Latest was R total hip replacement 10/2015. She has had post op nausea/vomiting.   Denies chest pain, tightness, dyspnea, or dizziness. Breathing better with physical therapy - attributes to diaphragmatic scarring after gastric bypass.  Sedentary - no regular exercise.   Relevant past medical, surgical, family and social history reviewed and updated as indicated. Interim medical history since our last visit reviewed. Allergies and medications reviewed and updated. Outpatient Medications Prior to Visit  Medication Sig Dispense Refill  . ACETAMINOPHEN-BUTALBITAL 50-325 MG TABS Take 1 tablet by mouth at bedtime. Reported on 09/03/2015 3 times a week    . albuterol (PROVENTIL HFA;VENTOLIN HFA) 108 (90 BASE) MCG/ACT inhaler Inhale 2 puffs into the lungs every 6 (six) hours as needed. 1 Inhaler 11  . aspirin 81 MG tablet Take 81 mg by mouth daily.    . benazepril (LOTENSIN) 10 MG tablet TAKE ONE TABLET BY MOUTH ONCE DAILY (Patient taking differently: TAKE ONE TABLET BY MOUTH ONCE DAILY qhs) 90 tablet 3  . carisoprodol  (SOMA) 350 MG tablet TAKE ONE TABLET BY MOUTH AT BEDTIME 90 tablet 1  . cetirizine (ZYRTEC) 10 MG tablet Take 10 mg by mouth daily.    . Cholecalciferol (VITAMIN D3) 1000 UNITS tablet Take 2,000 Units by mouth daily.     . citalopram (CELEXA) 40 MG tablet TAKE ONE TABLET BY MOUTH ONCE DAILY 90 tablet 3  . diclofenac sodium (VOLTAREN) 1 % GEL Apply 1 application topically 3 (three) times daily. (Patient taking differently: Apply 1 application topically 3 (three) times daily as needed. ) 1 Tube 1  . enoxaparin (LOVENOX) 40 MG/0.4ML injection Inject 0.4 mLs (40 mg total) into the skin daily. 14 Syringe 0  . EPINEPHrine (EPI-PEN) 0.3 mg/0.3 mL DEVI Inject 0.3 mLs (0.3 mg total) into the muscle once. (Patient taking differently: Inject 0.3 mg into the muscle as needed. PRN surgery) 1 Device 2  . fluconazole (DIFLUCAN) 150 MG tablet Take 1 tablet (150 mg total) by mouth daily. 2 tablet 0  . Fluticasone-Salmeterol (ADVAIR DISKUS) 100-50 MCG/DOSE AEPB Inhale 1 puff into the lungs every 12 (twelve) hours. 60 each 3  . furosemide (LASIX) 20 MG tablet TAKE ONE TABLET BY MOUTH ONCE DAILY 90 tablet 2  . gabapentin (NEURONTIN) 300 MG capsule Take 1 capsule (300 mg total) by mouth 3 (three) times a week. 12 capsule 11  . HYDROcodone-acetaminophen (NORCO) 7.5-325 MG tablet Take 1-2 tablets by mouth every 4 (four) hours as needed (breakthrough pain). 60 tablet 0  . levothyroxine (SYNTHROID, LEVOTHROID) 150 MCG tablet Take 150 mcg by mouth daily before  breakfast.    . metoprolol tartrate (LOPRESSOR) 25 MG tablet Take 1 tablet (25 mg total) by mouth 2 (two) times daily. 180 tablet 1  . montelukast (SINGULAIR) 10 MG tablet Take 10 mg by mouth every morning.     . ondansetron (ZOFRAN) 4 MG tablet Take 1 tablet (4 mg total) by mouth every 6 (six) hours as needed for nausea. 40 tablet 0  . scopolamine (TRANSDERM-SCOP) 1 MG/3DAYS Place 1 patch (1.5 mg total) onto the skin every 3 (three) days. 4 patch 0  . traMADol  (ULTRAM) 50 MG tablet Take 1 tablet (50 mg total) by mouth every 6 (six) hours as needed for moderate pain. 40 tablet 0  . vitamin B-12 (CYANOCOBALAMIN) 500 MCG tablet Take 500 mcg by mouth daily.    . traZODone (DESYREL) 50 MG tablet TAKE ONE-HALF TO ONE TABLET BY MOUTH AT BEDTIME AS NEEDED FOR SLEEP 90 tablet 1   No facility-administered medications prior to visit.     Past Medical History:  Diagnosis Date  . Bleeding disorder (HCC)   . Bronchial asthma   . Cancer (HCC)    melanoma  . 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 Trula Ore ortho)  . Chronic venous insufficiency 2007   s/p vein stripping and laser ablation  . Depression   . Diverticulosis    mild by colonoscopy  . Fibromyalgia 02/2014   [place]  . History of DVT (deep vein thrombosis)    DVTs after 1st pregnancy, not on AC 2/2 bleeding ulcer, greenfield filter in place  . History of gastric ulcer 2009  . Horseshoe kidney    sole, R kidney damage  . HTN (hypertension)   . Hypothyroidism   . Kidney stones    s/p renal hematoma after lithotripsy on right  . Lupus anticoagulant disorder (HCC)   . Multinodular goiter    s/p thyroidectomy  . Nasal septal perforation    chronic, ENT rec avoid antihistamine, INS  . Osteoarthritis    ?FM by rheum  . Overweight   . Post-operative nausea and vomiting   . Post-surgical hypothyroidism    for multinodular goiter  . 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 Jacqulyn Ducking  . Seasonal allergies    allergy shots, singulair  . 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 Tachycardia     Past Surgical History:  Procedure Laterality Date  . ABDOMINAL HYSTERECTOMY    . ANKLE FUSION Left 05/2013  . APPENDECTOMY  1978  . BACK SURGERY  2009  . CHOLECYSTECTOMY  1998  . COLONOSCOPY  05/2012   hyperplastic polyps x2, mild diverticulosis Arlyce Dice) rpt 10 yrs  . CYST EXCISION     back  and shoulder  . ESI Right 10/2015, 02/2016   C4/5 then C5/6 ESI ([place])  . ESI  Bilateral 06/2015, 08/2015, 03/2016   L4/5 transforaminal ESI ([place]nis)  . GASTRIC BYPASS  05/21/05   Dr. Jacqulyn Ducking, Roux-en-Y  . Greenfield filter removal  2015   removed 6 wks after surgery  . hammerhead toes Left   . HERNIA REPAIR    . lyphoma excision  08/2015   arm  . NASAL SEPTUM SURGERY  1981   deviated septum, repaired at [facility]  . PARTIAL HYSTERECTOMY  1989   ovaries remain  . SKIN SURGERY     basel cell  . TONSILLECTOMY  1969  . TOTAL HIP ARTHROPLASTY Right 11/06/2015   Procedure: TOTAL HIP ARTHROPLASTY ANTERIOR APPROACH;  Surgeon: Kennedy Bucker, MD;  Location: [facility] ORS;  Service: Orthopedics;  Laterality: Right;  . TOTAL KNEE ARTHROPLASTY Left 08/2013  . TOTAL THYROIDECTOMY  09/2011   benign path (done for multinodular goiter concern for cancer)    Family History  Problem Relation Age of Onset  . Diabetes Mother     prediabetes  . Alzheimer's disease Mother   . Hyperlipidemia Mother   . Diabetes Father   . Coronary artery disease Father 28    CABG  . Alcohol abuse Father   . Cancer Father     lung  . Cancer Maternal Uncle     brain  . Diabetes Paternal Grandmother   . Stroke Neg Hx   . Colon cancer Neg Hx   . Stomach cancer Neg Hx   . Kidney disease Neg Hx   . Breast cancer Neg Hx     Social History  Substance Use Topics  . Smoking status: Former Smoker    Quit date: 06/16/2004  . Smokeless tobacco: Never Used     Comment: Socially-no longer  . Alcohol use 0.0 oz/week     Comment: Occasionally-wine    Per HPI unless specifically indicated in ROS section below Review of Systems     Objective:    BP 118/82   Pulse 60   Temp 98.4 F (36.9 C) (Oral)   Wt 229 lb (103.9 kg)   BMI 33.82 kg/m   Wt Readings from Last 3 Encounters:  08/28/16 229 lb (103.9 kg)  08/25/16 233 lb (105.7 kg)  04/01/16 233 lb 8 oz (105.9 kg)    Physical Exam  Constitutional: She appears well-developed and well-nourished. No distress.  HENT:  Mouth/Throat: Oropharynx is clear  and moist. No oropharyngeal exudate.  Chronic nasal septal perforation  Eyes: Conjunctivae and EOM are normal. Pupils are equal, round, and reactive to light. No scleral icterus.  Cardiovascular: Normal rate, regular rhythm, normal heart sounds and intact distal pulses.   No murmur heard. Pulmonary/Chest: Effort normal and breath sounds normal. No respiratory distress. She has no wheezes. She has no rales.  Musculoskeletal: She exhibits no edema.  Skin: Skin is warm and dry. No rash noted.  Psychiatric: She has a normal mood and affect.  Nursing note and vitals reviewed.  Results for orders placed or performed in visit on 08/26/16  HM MAMMOGRAPHY  Result Value Ref Range   HM Mammogram 0-4 Bi-Rad 0-4 Bi-Rad, Self Reported Normal   Lab Results  Component Value Date   TSH 1.00 12/13/2015   Lab Results  Component Value Date   WBC 4.4 03/25/2016   HGB 14.0 03/25/2016   HCT 41.3 03/25/2016   MCV 87.7 03/25/2016   PLT 223.0 03/25/2016    Lab Results  Component Value Date   CREATININE 1.08 03/25/2016    Lab Results  Component Value Date   NA 137 03/25/2016   K 4.0 03/25/2016   CL 102 03/25/2016   CO2 27 03/25/2016   Lab Results  Component Value Date   CHOL 200 03/25/2016   HDL 39.50 03/25/2016   LDLCALC 130 (H) 03/25/2016   TRIG 151.0 (H) 03/25/2016   CHOLHDL 5 03/25/2016   EKG - NSR rate 60, normal axis, intervals, no acute ST/T changes.     Assessment & Plan:   Problem List Items Addressed This Visit    History of DVT (deep vein thrombosis)   Lupus anticoagulant disorder (HCC)    Appreciate heme eval for upcoming surgery.  Pre-op exam - Primary    RCRI score = 0. Should be low risk enough to proceed with surgery. No need for further cardiovascular testing.  Pending IVC filter placement by [facility]. Pending heme evaluation for h/o lupus anticoagulant disorder  Check EKG and CXR today. Reviewed labs from 03/2016. She has preop scheduled for 09/09/2016.       Relevant Orders   EKG 12-Lead (Completed)   DG Chest 2 View (Completed)       Follow up plan: Return if symptoms worsen or fail to improve.  Eustaquio Boyden, MD

## 2016-08-28 NOTE — Assessment & Plan Note (Addendum)
RCRI score = 0. Should be low risk enough to proceed with surgery. No need for further cardiovascular testing.  Pending IVC filter placement by [facility]. Pending heme evaluation for h/o lupus anticoagulant disorder  Check EKG and CXR today. Reviewed labs from 03/2016. She has preop scheduled for 09/09/2016.

## 2016-08-28 NOTE — Patient Instructions (Signed)
EKG today Chest xray today  Good to see you today, call us with questions. We will forward today's note to Dr Ether Griffins.

## 2016-08-28 NOTE — Telephone Encounter (Signed)
Ok to refill? Last filled 12/04/15 #90 1RF

## 2016-08-28 NOTE — Assessment & Plan Note (Signed)
Appreciate heme eval for upcoming surgery.

## 2016-08-28 NOTE — Progress Notes (Signed)
Pre visit review using our clinic review tool, if applicable. No additional management support is needed unless otherwise documented below in the visit note. 

## 2016-08-29 ENCOUNTER — Other Ambulatory Visit (INDEPENDENT_AMBULATORY_CARE_PROVIDER_SITE_OTHER): Payer: Self-pay | Admitting: Vascular Surgery

## 2016-08-29 ENCOUNTER — Telehealth: Payer: Self-pay | Admitting: Emergency Medicine

## 2016-08-29 NOTE — Telephone Encounter (Signed)
Called and spoke with pt regardinf pre-op paper work. Informed pt that forms have been completed patient states that she would like the paper form faxed to [facility] and if at all possible keep them and if she needs to some and get them she will do so Monday. I informed patient that I would make note of her request. Pt verbalized understanding.   Paper work faxed to [phone]

## 2016-09-01 NOTE — Progress Notes (Signed)
09/02/2016 9:44 AM   Claire Strong [birth year] [id number]  Referring provider: Eustaquio Boyden, MD [address] [place], [place] [address]  Chief Complaint  Patient presents with  . Nephrolithiasis    1 year follow up     HPI: Patient is a 56 year old Caucasian female with a history of nephrolithiasis with a horseshoe kidney who presents today for a 1 year follow-up.  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 She was taken to the OR on 07/05/14 for right URS, LL, stent. She tolerated the procedure well and her stent was removed a few weeks later.  She had a follow-up renal ultrasound which showed no evidence of hydronephrosis, just fullness of the RIGHT kidney consistent with her horseshoe anatomy.  She does have a prior history of nephrolithiasis and underwent ESWL in the past. Unfortunately this was complicated by a perinephric hematoma, bleeding, ureteral obstruction requiring emergent stent placement and hospitalization. During that admission, she was found to have coagulopathy as well.  Stone analysis shows stone composition calcium oxalate and calcium phosphate.  Risk factors for stone production and include history of gastric bypass, horseshoe kidney anatomy. From a dietary behavioral perspective, she does quite well and avoids additional salt, dehydration, and excessive animal proteins.  Today, she states she is having some mild bilateral flank pain.  She has not had any recent fevers, chills, nausea or vomiting.  UA today was unremarkable.  KUB taken today noted suspect calculi in each kidney. No bowel obstruction or free air.  Stable calcification lateral right abdomen. Prior CT examination shows that this calcification is lateral to the gallbladder fossa.  Status post total hip replacement on the right. Arterial vascular calcifications/atherosclerosis in right pelvis.  I have personally reviewed the films  myself and with the patient.  Of note, she is experiencing difficulty with urination.  She states that she has to lean forward in order to empty her bladder.  This causes an accentuation of the pain in her lower back.  Her pain is due to bulging disks, degenerative joint disease and scoliosis.  She feels that she is emptying her bladder completely.  She is not experiencing dysuria or urinary tract infections.  She is experiencing urgency x 0-3, frequency x 8, limiting her fluid intake to avoid bathroom trips, engages in toilet mapping, incontinence x 0-3 and nocturia x 0-3.  Her PVR today is 0 mL.  She is not having dysuria, gross hematuria or suprapubic pain.  She is not having fevers, chills, nausea or vomiting.  She does have a remote history of saddle anesthesia and had been evaluated by neurology in the past.  She states she still has some areas of numbness, but they are recovering.  She does not have fecal incontinence.     PMH: Past Medical History:  Diagnosis Date  . Bleeding disorder (HCC)   . Bronchial asthma   . Cancer (HCC)    melanoma  . 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 Trula Ore ortho)  . Chronic venous insufficiency 2007   s/p vein stripping and laser ablation  . Depression   . Diverticulosis    mild by colonoscopy  . Fibromyalgia 02/2014   [place]  . History of DVT (deep vein thrombosis)    DVTs after 1st pregnancy, not on AC 2/2 bleeding ulcer, greenfield filter in place  . History of gastric ulcer 2009  .  Horseshoe kidney    sole, R kidney damage  . HTN (hypertension)   . Hypothyroidism   . Kidney stones    s/p renal hematoma after lithotripsy on right  . Lupus anticoagulant disorder (HCC)   . Multinodular goiter    s/p thyroidectomy  . Nasal septal perforation    chronic, ENT rec avoid antihistamine, INS  . Osteoarthritis    ?FM by rheum  . Overweight   . Post-operative nausea and vomiting   . Post-surgical hypothyroidism      for multinodular goiter  . 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 Jacqulyn Ducking  . Seasonal allergies    allergy shots, singulair  . 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 Tachycardia     Surgical History: Past Surgical History:  Procedure Laterality Date  . ABDOMINAL HYSTERECTOMY    . ANKLE FUSION Left 05/2013  . APPENDECTOMY  1978  . BACK SURGERY  2009  . CHOLECYSTECTOMY  1998  . COLONOSCOPY  05/2012   hyperplastic polyps x2, mild diverticulosis Arlyce Dice) rpt 10 yrs  . CYST EXCISION     back  and shoulder  . ESI Right 10/2015, 02/2016   C4/5 then C5/6 ESI ([facility])  . ESI Bilateral 06/2015, 08/2015, 03/2016   L4/5 transforaminal ESI (Ch[place]nis)  . GASTRIC BYPASS  05/21/05   Dr. Jacqulyn Ducking, Roux-en-Y  . Greenfield filter removal  2015   removed 6 wks after surgery  . hammerhead toes Left   . HERNIA REPAIR    . lyphoma excision  08/2015   arm  . NASAL SEPTUM SURGERY  1981   deviated septum, repaired at [facility]  . PARTIAL HYSTERECTOMY  1989   ovaries remain  . SKIN SURGERY     basel cell  . TONSILLECTOMY  1969  . TOTAL HIP ARTHROPLASTY Right 11/06/2015   Procedure: TOTAL HIP ARTHROPLASTY ANTERIOR APPROACH;  Surgeon: Kennedy Bucker, MD;  Location: [facility] ORS;  Service: Orthopedics;  Laterality: Right;  . TOTAL KNEE ARTHROPLASTY Left 08/2013  . TOTAL THYROIDECTOMY  09/2011   benign path (done for multinodular goiter concern for cancer)    Home Medications:  Allergies as of 09/02/2016      Reactions   Sulfa Antibiotics Shortness Of Breath   Cefuroxime Axetil Other (See Comments)   Bleeding ulcer   Nutritional Supplements Swelling   Unsure which   Oxycodone-acetaminophen Nausea And Vomiting   Betadine [povidone Iodine] Rash      Medication List       Accurate as of 09/02/16  9:44 AM. Always use your most recent med list.          ACETAMINOPHEN-BUTALBITAL 50-325 MG Tabs Take 1 tablet by mouth at bedtime. Reported on 09/03/2015 3 times a week   albuterol 108 (90 Base) MCG/ACT  inhaler Commonly known as:  PROVENTIL HFA;VENTOLIN HFA Inhale 2 puffs into the lungs every 6 (six) hours as needed.   aspirin 81 MG tablet Take 81 mg by mouth daily.   benazepril 10 MG tablet Commonly known as:  LOTENSIN TAKE ONE TABLET BY MOUTH ONCE DAILY   carisoprodol 350 MG tablet Commonly known as:  SOMA TAKE ONE TABLET BY MOUTH AT BEDTIME   cetirizine 10 MG tablet Commonly known as:  ZYRTEC Take 10 mg by mouth daily.   cholecalciferol 1000 units tablet Commonly known as:  VITAMIN D Take 2,000 Units by mouth daily.   citalopram 40 MG tablet Commonly known as:  CELEXA TAKE ONE TABLET BY MOUTH ONCE DAILY  diclofenac sodium 1 % Gel Commonly known as:  VOLTAREN Apply 1 application topically 3 (three) times daily.   enoxaparin 40 MG/0.4ML injection Commonly known as:  LOVENOX Inject 0.4 mLs (40 mg total) into the skin daily.   EPINEPHrine 0.3 mg/0.3 mL Devi Commonly known as:  EPI-PEN Inject 0.3 mLs (0.3 mg total) into the muscle once.   fluconazole 150 MG tablet Commonly known as:  DIFLUCAN Take 1 tablet (150 mg total) by mouth daily.   Fluticasone-Salmeterol 100-50 MCG/DOSE Aepb Commonly known as:  ADVAIR DISKUS Inhale 1 puff into the lungs every 12 (twelve) hours.   furosemide 20 MG tablet Commonly known as:  LASIX TAKE ONE TABLET BY MOUTH ONCE DAILY   gabapentin 300 MG capsule Commonly known as:  NEURONTIN Take 1 capsule (300 mg total) by mouth 3 (three) times a week.   HYDROcodone-acetaminophen 7.5-325 MG tablet Commonly known as:  NORCO Take 1-2 tablets by mouth every 4 (four) hours as needed (breakthrough pain).   levothyroxine 150 MCG tablet Commonly known as:  SYNTHROID, LEVOTHROID Take 150 mcg by mouth daily before breakfast.   metoprolol tartrate 25 MG tablet Commonly known as:  LOPRESSOR Take 1 tablet (25 mg total) by mouth 2 (two) times daily.   montelukast 10 MG tablet Commonly known as:  SINGULAIR Take 10 mg by mouth every  morning.   NON FORMULARY Tumeric   ondansetron 4 MG tablet Commonly known as:  ZOFRAN Take 1 tablet (4 mg total) by mouth every 6 (six) hours as needed for nausea.   scopolamine 1 MG/3DAYS Commonly known as:  TRANSDERM-SCOP Place 1 patch (1.5 mg total) onto the skin every 3 (three) days.   traMADol 50 MG tablet Commonly known as:  ULTRAM Take 1 tablet (50 mg total) by mouth every 6 (six) hours as needed for moderate pain.   traZODone 50 MG tablet Commonly known as:  DESYREL TAKE ONE-HALF TO ONE TABLET BY MOUTH AT BEDTIME AS NEEDED FOR SLEEP   vitamin B-12 500 MCG tablet Commonly known as:  CYANOCOBALAMIN Take 500 mcg by mouth daily.       Allergies:  Allergies  Allergen Reactions  . Sulfa Antibiotics Shortness Of Breath  . Cefuroxime Axetil Other (See Comments)    Bleeding ulcer  . Nutritional Supplements Swelling    Unsure which  . Oxycodone-Acetaminophen Nausea And Vomiting  . Betadine [Povidone Iodine] Rash    Family History: Family History  Problem Relation Age of Onset  . Diabetes Mother     prediabetes  . Alzheimer's disease Mother   . Hyperlipidemia Mother   . Diabetes Father   . Coronary artery disease Father 38    CABG  . Alcohol abuse Father   . Cancer Father     lung  . Cancer Maternal Uncle     brain  . Diabetes Paternal Grandmother   . Stroke Neg Hx   . Colon cancer Neg Hx   . Stomach cancer Neg Hx   . Kidney disease Neg Hx   . Breast cancer Neg Hx   . Kidney cancer Neg Hx   . Bladder Cancer Neg Hx     Social History:  reports that she quit smoking about 12 years ago. She has never used smokeless tobacco. She reports that she drinks alcohol. She reports that she does not use drugs.  ROS: UROLOGY Frequent Urination?: No Hard to postpone urination?: No Burning/pain with urination?: No Get up at night to urinate?: No Leakage of urine?: No Urine  stream starts and stops?: No Trouble starting stream?: No Do you have to strain to  urinate?: No Blood in urine?: No Urinary tract infection?: No Sexually transmitted disease?: No Injury to kidneys or bladder?: No Painful intercourse?: No Weak stream?: No Currently pregnant?: No Vaginal bleeding?: No Last menstrual period?: n  Gastrointestinal Nausea?: No Vomiting?: No Indigestion/heartburn?: No Diarrhea?: No Constipation?: No  Constitutional Fever: No Night sweats?: No Weight loss?: No Fatigue?: No  Skin Skin rash/lesions?: No Itching?: No  Eyes Blurred vision?: No Double vision?: No  Ears/Nose/Throat Sore throat?: No Sinus problems?: Yes  Hematologic/Lymphatic Swollen glands?: No Easy bruising?: No  Cardiovascular Leg swelling?: No Chest pain?: No  Respiratory Cough?: Yes Shortness of breath?: No  Endocrine Excessive thirst?: No  Musculoskeletal Back pain?: Yes Joint pain?: Yes  Neurological Headaches?: No Dizziness?: No  Psychologic Depression?: Yes Anxiety?: No  Physical Exam: BP 106/75   Pulse 68   Ht 5\' 9"  (1.753 m)   Wt 231 lb 4.8 oz (104.9 kg)   BMI 34.16 kg/m   Constitutional: Well nourished. Alert and oriented, No acute distress. HEENT: [place] AT, moist mucus membranes. Trachea midline, no masses. Cardiovascular: No clubbing, cyanosis, or edema. Respiratory: Normal respiratory effort, no increased work of breathing. GI: Abdomen is soft, non tender, non distended, no abdominal masses. Liver and spleen not palpable.  No hernias appreciated.  Stool sample for occult testing is not indicated.   GU: No CVA tenderness.  No bladder fullness or masses.  Normal external genitalia, normal pubic hair distribution, no lesions.  Normal urethral meatus, no lesions, no prolapse, no discharge.   No urethral masses, tenderness and/or tenderness. No bladder fullness, tenderness or masses. Normal vagina mucosa, good estrogen effect, no discharge, no lesions, good pelvic support, Grade I cystocele is noted.  No rectocele noted.  Uterus  and cervix are surgically absent.   No adnexal/parametria masses or tenderness noted.  Anus and perineum are without rashes or lesions.    Skin: No rashes, bruises or suspicious lesions. Lymph: No cervical or inguinal adenopathy. Neurologic: Grossly intact, no focal deficits, moving all 4 extremities. Psychiatric: Normal mood and affect.  Laboratory Data: Lab Results  Component Value Date   WBC 4.4 03/25/2016   HGB 14.0 03/25/2016   HCT 41.3 03/25/2016   MCV 87.7 03/25/2016   PLT 223.0 03/25/2016   Lab Results  Component Value Date   CREATININE 1.08 03/25/2016   Lab Results  Component Value Date   TSH 1.00 12/13/2015      Component Value Date/Time   CHOL 200 03/25/2016 0833   HDL 39.50 03/25/2016 0833   CHOLHDL 5 03/25/2016 0833   VLDL 30.2 03/25/2016 0833   LDLCALC 130 (H) 03/25/2016 0833   Lab Results  Component Value Date   AST 16 03/25/2016   Lab Results  Component Value Date   ALT 18 03/25/2016    Urinalysis Unremarkable.  See EPIC.    Pertinent Imaging: Results for SHANDREKA, DANTE (MRN Claire Strong) as of 09/03/2016 09:52  Ref. Range 09/02/2016 09:16  Scan Result Unknown 0    CLINICAL DATA:  Nephrolithiasis  EXAM: ABDOMEN - 1 VIEW  COMPARISON:  September 03, 2015 abdominal radiographs and CT abdomen and pelvis May 02, 2014  FINDINGS: Calcifications in the right pelvis are stable and felt to have vascular etiology.  There is a calcification in the midportion of each kidney, suspicious for nephrolithiasis. There is evidence of subtle calcification in each kidney measuring approximately 8 mm.  There is  a focal calcification more laterally in the right upper quadrant laterally measuring 2.7 x 1.7 cm. There is moderate stool throughout the colon. No bowel dilatation or air-fluid level suggesting bowel obstruction. No free air.  IMPRESSION: Suspect calculi in each kidney. No bowel obstruction or free air. Stable calcification lateral right  abdomen. Prior CT examination shows that this calcification is lateral to the gallbladder fossa. Status post total hip replacement on the right. Arterial vascular calcifications/atherosclerosis in right pelvis.   Electronically Signed   By: Bretta Bang III M.D.   On: 09/02/2016 09:03  Assessment & Plan:    1. History of kidney stones/flank pain  - KUB demonstrated possible nephrolithiasis  - UA was negative for hematuria   - schedule CT Renal stone study - I will call with report  - Urinalysis, Complete  2. Horseshoe kidney  - Normal variant  - CT Renal stone study pending  3. Difficulty urinating  - Urinalysis, Complete  -  offered behavioral therapies, bladder training, bladder control strategies and pelvic floor muscle training  - fluid management - adequate fluids   - offered medical therapy with anticholinergic therapy or beta-3 adrenergic receptor agonist and the potential side effects of each therapy - patient deferred  - patient would like an appointment with Dr Sherron Monday as she has had a history of saddle anesthesia and would like a second opinion regarding its effect on her bladder  Return for follow up with Dr. Sherron Monday.  These notes generated with voice recognition software. I apologize for typographical errors.  Michiel Cowboy, PA-C  [facility] [address] [place], [place] [address] [phone]

## 2016-09-02 ENCOUNTER — Ambulatory Visit
Admission: RE | Admit: 2016-09-02 | Discharge: 2016-09-02 | Disposition: A | Discharge status: Home / Self Care | Payer: BLUE CROSS/BLUE SHIELD | Source: Ambulatory Visit | Attending: Urology | Admitting: Urology

## 2016-09-02 ENCOUNTER — Ambulatory Visit (INDEPENDENT_AMBULATORY_CARE_PROVIDER_SITE_OTHER): Payer: BLUE CROSS/BLUE SHIELD | Admitting: Urology

## 2016-09-02 ENCOUNTER — Encounter: Payer: Self-pay | Admitting: Urology

## 2016-09-02 ENCOUNTER — Other Ambulatory Visit: Payer: Self-pay

## 2016-09-02 VITALS — BP 106/75 | HR 68 | Ht 69.0 in | Wt 231.3 lb

## 2016-09-02 DIAGNOSIS — Q631 Lobulated, fused and horseshoe kidney: Secondary | ICD-10-CM | POA: Diagnosis not present

## 2016-09-02 DIAGNOSIS — N200 Calculus of kidney: Secondary | ICD-10-CM

## 2016-09-02 DIAGNOSIS — R109 Unspecified abdominal pain: Secondary | ICD-10-CM

## 2016-09-02 DIAGNOSIS — Z96641 Presence of right artificial hip joint: Secondary | ICD-10-CM | POA: Insufficient documentation

## 2016-09-02 DIAGNOSIS — R39198 Other difficulties with micturition: Secondary | ICD-10-CM | POA: Diagnosis not present

## 2016-09-02 LAB — BLADDER SCAN AMB NON-IMAGING: Scan Result: 0

## 2016-09-03 ENCOUNTER — Encounter: Payer: Self-pay | Admitting: Hematology and Oncology

## 2016-09-03 ENCOUNTER — Inpatient Hospital Stay: Payer: BLUE CROSS/BLUE SHIELD | Attending: Hematology and Oncology | Admitting: Hematology and Oncology

## 2016-09-03 ENCOUNTER — Inpatient Hospital Stay: Payer: BLUE CROSS/BLUE SHIELD

## 2016-09-03 VITALS — BP 114/83 | HR 51 | Temp 97.6°F | Resp 18 | Ht 69.0 in | Wt 233.0 lb

## 2016-09-03 DIAGNOSIS — Z87891 Personal history of nicotine dependence: Secondary | ICD-10-CM | POA: Insufficient documentation

## 2016-09-03 DIAGNOSIS — F329 Major depressive disorder, single episode, unspecified: Secondary | ICD-10-CM | POA: Insufficient documentation

## 2016-09-03 DIAGNOSIS — I10 Essential (primary) hypertension: Secondary | ICD-10-CM | POA: Diagnosis not present

## 2016-09-03 DIAGNOSIS — Z8719 Personal history of other diseases of the digestive system: Secondary | ICD-10-CM | POA: Insufficient documentation

## 2016-09-03 DIAGNOSIS — Z9884 Bariatric surgery status: Secondary | ICD-10-CM | POA: Diagnosis not present

## 2016-09-03 DIAGNOSIS — E890 Postprocedural hypothyroidism: Secondary | ICD-10-CM | POA: Diagnosis not present

## 2016-09-03 DIAGNOSIS — Z801 Family history of malignant neoplasm of trachea, bronchus and lung: Secondary | ICD-10-CM | POA: Diagnosis not present

## 2016-09-03 DIAGNOSIS — Z87442 Personal history of urinary calculi: Secondary | ICD-10-CM | POA: Diagnosis not present

## 2016-09-03 DIAGNOSIS — Z808 Family history of malignant neoplasm of other organs or systems: Secondary | ICD-10-CM | POA: Insufficient documentation

## 2016-09-03 DIAGNOSIS — D509 Iron deficiency anemia, unspecified: Secondary | ICD-10-CM | POA: Insufficient documentation

## 2016-09-03 DIAGNOSIS — I872 Venous insufficiency (chronic) (peripheral): Secondary | ICD-10-CM | POA: Insufficient documentation

## 2016-09-03 DIAGNOSIS — M129 Arthropathy, unspecified: Secondary | ICD-10-CM | POA: Diagnosis not present

## 2016-09-03 DIAGNOSIS — J45909 Unspecified asthma, uncomplicated: Secondary | ICD-10-CM | POA: Diagnosis not present

## 2016-09-03 DIAGNOSIS — Z79899 Other long term (current) drug therapy: Secondary | ICD-10-CM | POA: Insufficient documentation

## 2016-09-03 DIAGNOSIS — M797 Fibromyalgia: Secondary | ICD-10-CM | POA: Insufficient documentation

## 2016-09-03 DIAGNOSIS — E669 Obesity, unspecified: Secondary | ICD-10-CM | POA: Insufficient documentation

## 2016-09-03 DIAGNOSIS — Z7982 Long term (current) use of aspirin: Secondary | ICD-10-CM | POA: Diagnosis not present

## 2016-09-03 DIAGNOSIS — M1990 Unspecified osteoarthritis, unspecified site: Secondary | ICD-10-CM | POA: Insufficient documentation

## 2016-09-03 DIAGNOSIS — F419 Anxiety disorder, unspecified: Secondary | ICD-10-CM | POA: Diagnosis not present

## 2016-09-03 DIAGNOSIS — R791 Abnormal coagulation profile: Secondary | ICD-10-CM | POA: Diagnosis not present

## 2016-09-03 DIAGNOSIS — D6862 Lupus anticoagulant syndrome: Secondary | ICD-10-CM

## 2016-09-03 DIAGNOSIS — Q631 Lobulated, fused and horseshoe kidney: Secondary | ICD-10-CM | POA: Insufficient documentation

## 2016-09-03 DIAGNOSIS — Z86718 Personal history of other venous thrombosis and embolism: Secondary | ICD-10-CM | POA: Insufficient documentation

## 2016-09-03 DIAGNOSIS — Z8711 Personal history of peptic ulcer disease: Secondary | ICD-10-CM | POA: Diagnosis not present

## 2016-09-03 DIAGNOSIS — Z85820 Personal history of malignant melanoma of skin: Secondary | ICD-10-CM | POA: Insufficient documentation

## 2016-09-03 LAB — COMPREHENSIVE METABOLIC PANEL
ALT: 15 U/L (ref 14–54)
AST: 20 U/L (ref 15–41)
Albumin: 4.1 g/dL (ref 3.5–5.0)
Alkaline Phosphatase: 146 U/L — ABNORMAL HIGH (ref 38–126)
Anion gap: 7 (ref 5–15)
BUN: 13 mg/dL (ref 6–20)
CO2: 26 mmol/L (ref 22–32)
Calcium: 8.6 mg/dL — ABNORMAL LOW (ref 8.9–10.3)
Chloride: 102 mmol/L (ref 101–111)
Creatinine, Ser: 0.95 mg/dL (ref 0.44–1.00)
GFR calc Af Amer: >60 mL/min (ref >60)
GFR calc non Af Amer: >60 mL/min (ref >60)
Glucose, Bld: 98 mg/dL (ref 65–99)
Potassium: 3.6 mmol/L (ref 3.5–5.1)
Sodium: 135 mmol/L (ref 135–145)
Total Bilirubin: 0.8 mg/dL (ref 0.3–1.2)
Total Protein: 7.1 g/dL (ref 6.5–8.1)

## 2016-09-03 LAB — CBC WITH DIFFERENTIAL/PLATELET
Basophils Absolute: 0 10*3/uL (ref 0–0.1)
Basophils Relative: 1 %
Eosinophils Absolute: 0.1 10*3/uL (ref 0–0.7)
Eosinophils Relative: 2 %
HCT: 39.4 % (ref 35.0–47.0)
Hemoglobin: 13.3 g/dL (ref 12.0–16.0)
Lymphocytes Relative: 34 %
Lymphs Abs: 1.8 10*3/uL (ref 1.0–3.6)
MCH: 29.6 pg (ref 26.0–34.0)
MCHC: 33.7 g/dL (ref 32.0–36.0)
MCV: 87.7 fL (ref 80.0–100.0)
Monocytes Absolute: 0.3 10*3/uL (ref 0.2–0.9)
Monocytes Relative: 6 %
Neutro Abs: 3 10*3/uL (ref 1.4–6.5)
Neutrophils Relative %: 57 %
Platelets: 215 10*3/uL (ref 150–440)
RBC: 4.49 MIL/uL (ref 3.80–5.20)
RDW: 12.2 % (ref 11.5–14.5)
WBC: 5.2 10*3/uL (ref 3.6–11.0)

## 2016-09-03 LAB — URINALYSIS, COMPLETE
Bilirubin, UA: NEGATIVE
GLUCOSE, UA: NEGATIVE
KETONES UA: NEGATIVE
Nitrite, UA: NEGATIVE
Protein, UA: NEGATIVE
RBC, UA: NEGATIVE
SPEC GRAV UA: 1.01 (ref 1.005–1.030)
Urobilinogen, Ur: 0.2 mg/dL (ref 0.2–1.0)
pH, UA: 5 (ref 5.0–7.5)

## 2016-09-03 LAB — MICROSCOPIC EXAMINATION: RBC MICROSCOPIC, UA: NONE SEEN /HPF (ref 0–?)

## 2016-09-03 NOTE — Progress Notes (Signed)
Patient here today as new evaluation regarding surgical clearance for 09-12-16 for foot surgery (fusing joints in ankle/foot). Patient has history of DVTs.  Patient is having filters put in on Tuesday.    Patient formerly saw Dr. Sherrlyn Hock.for DVTs.   Patient has Lupus that is in remission.

## 2016-09-03 NOTE — Progress Notes (Signed)
[facility]-  Cancer Center  Clinic day:  09/03/2016  Chief Complaint: Claire Strong is a 56 y.o. female with a history of DVT who is referred by Dr. Ether Griffins for surgical clearance.  HPI:  The patient was previously seen by Dr. Sherrlyn Hock.  Notes from 2010 indicate a history of a prolonged PTT. Patient had a positive lupus anticoagulant in 07/2008.   She has a history of DVT 2 with 2 pregnancies. The second pregnancy was in 1988. She developed a right lower extremity DVT post-partum.  She had a C-section and was at bedrest.  Clot was in the right calf.  She denies anticoagulation.  She was treated with warm compresses.    She has a history of clots below an IVC filter in 2006 after gastric bypass surgery. Lower extremity duplex was negative.  She was on Lovenox 40 mg BID.  Six months later, her filter was removed.  She has a history of a lithotripsy in 06/2008.  She then had a right ureter double-J stent placed on 07/16/2016.  She has a horse shoe kidney.  She developed an acute drop in hemoglobin. CT and MRI revealed a large perinephric and retroperitoneal bleed on 07/18/2008.  Creatinine increased to 3.0.  She was on ferrous sulfate for many months to correct her subsequent iron deficiency anemia.  She was on Lovenox BID.  She states that her lupus was "in remission".  Work-up at that time revealed an INR of 1.2 and PTT of 48.  PT with mix corrected.  She received vitamin K. PTT with mix did not correct.  She was felt to have a lupus anticoagulant. Bleeding diathesis work-up revealed the following normal studies:  factor IX activity, factor VIII activity, and von Willebrand panel.  Last note from Dr Sherrlyn Hock on 08/05/2008 noted that if she would need a surgical procedure, that she should ambulate as early as feasible and will need to start DVT prophylaxis as soon as possible after the procedure.  She states that she had foot surgery in 2016.  She states that her joint was fused.   She had her second IVC filter placed.  She was again placed on Lovenox 40 mg BID until she was ambulating normally.  Filter was removed 6 months later.  Family history is notable for a daughter with a lupus anticoagulant.  She has had DVT x 3.  First clot was at 62 or 56 years of age.  She states that she "can't take a blood thinner".  She notes that her "iron is low".  She has received Venofer x 2 (before Christmas 2017 and 6 weeks later).  She has a history of gastric ulcer.  She notes arthritis.  She takes oral B12.  She is not on anticoagulation.   Past Medical History:  Diagnosis Date  . Anxiety   . Bleeding disorder (HCC)   . Bronchial asthma   . Cancer (HCC)    melanoma  . 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 Trula Ore ortho)  . Chronic venous insufficiency 2007   s/p vein stripping and laser ablation  . Depression   . Diverticulosis    mild by colonoscopy  . Fibromyalgia 02/2014   [place]  . History of DVT (deep vein thrombosis)    DVTs after 1st pregnancy, not on AC 2/2 bleeding ulcer, greenfield filter in place  . History of gastric ulcer 2009  . History of kidney stones   .  Horseshoe kidney    sole, R kidney damage  . HTN (hypertension)   . Hypothyroidism   . Kidney stones    s/p renal hematoma after lithotripsy on right  . Lupus anticoagulant disorder ([place])   . Multinodular goiter    s/p thyroidectomy  . Nasal septal perforation    chronic, ENT rec avoid antihistamine, INS  . Osteoarthritis    ?FM by rheum  . Overweight   . Post-operative nausea and vomiting   . Post-surgical hypothyroidism    for multinodular goiter  . 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 Frutoso Chase  . Seasonal allergies    allergy shots, singulair  . 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 Tachycardia     Past Surgical History:  Procedure Laterality Date  . ABDOMINAL HERNIA REPAIR    . ABDOMINAL HYSTERECTOMY    . ANKLE FUSION Left 05/2013  .  APPENDECTOMY  1978  . BACK SURGERY  2009  . CESAREAN SECTION     X 2  . CHOLECYSTECTOMY  1998  . COLONOSCOPY  05/2012   hyperplastic polyps x2, mild diverticulosis Deatra Ina) rpt 10 yrs  . CYST EXCISION     back  and shoulder  . ESI Right 10/2015, 02/2016   C4/5 then C5/6 ESI ([place]asnis)  . ESI Bilateral 06/2015, 08/2015, 03/2016   L4/5 transforaminal ESI (Chasnis)  . GASTRIC BYPASS  05/21/05   Dr. Frutoso Chase, Roux-en-Y  . Greenfield filter removal  2015   removed 6 wks after surgery  . hammerhead toes Left   . HERNIA REPAIR     X 3  . IVC FILTER INSERTION N/A 09/09/2016   Procedure: IVC Filter Insertion;  Surgeon: Katha Cabal, MD;  Location: [place] [facility];  Service: Cardiovascular;  Laterality: N/A;  . JOINT REPLACEMENT    . lyphoma excision  08/2015   arm  . NASAL SEPTUM SURGERY  1981   deviated septum, repaired at [facility]  . PARTIAL HYSTERECTOMY  1989   ovaries remain  . SKIN SURGERY     basel cell  . TONSILLECTOMY  1969  . TOTAL HIP ARTHROPLASTY Right 11/06/2015   Procedure: TOTAL HIP ARTHROPLASTY ANTERIOR APPROACH;  Surgeon: Hessie Knows, MD;  Location: [facility] ORS;  Service: Orthopedics;  Laterality: Right;  . TOTAL KNEE ARTHROPLASTY Left 08/2013  . TOTAL THYROIDECTOMY  09/2011   benign path (done for multinodular goiter concern for cancer)    Family History  Problem Relation Age of Onset  . Diabetes Mother     prediabetes  . Alzheimer's disease Mother   . Hyperlipidemia Mother   . Diabetes Father   . Coronary artery disease Father 22    CABG  . Alcohol abuse Father   . Cancer Father     lung  . Cancer Maternal Uncle     brain  . Diabetes Paternal Grandmother   . Stroke Neg Hx   . Colon cancer Neg Hx   . Stomach cancer Neg Hx   . Kidney disease Neg Hx   . Breast cancer Neg Hx   . Kidney cancer Neg Hx   . Bladder Cancer Neg Hx     Social History:  reports that she quit smoking about 12 years ago. Her smoking use included Cigarettes. She has never used  smokeless tobacco. She reports that she drinks alcohol. She reports that she does not use drugs.  She lives in [place].  The patient is alone today.  Allergies:  Allergies  Allergen  Reactions  . Sulfa Antibiotics Shortness Of Breath  . Cefuroxime Axetil Other (See Comments)    Bleeding ulcer  . Nutritional Supplements Swelling    Unsure which  . Oxycodone-Acetaminophen Nausea And Vomiting  . Betadine [Povidone Iodine] Rash    Current Medications: Current Outpatient Prescriptions  Medication Sig Dispense Refill  . albuterol (PROVENTIL HFA;VENTOLIN HFA) 108 (90 BASE) MCG/ACT inhaler Inhale 2 puffs into the lungs every 6 (six) hours as needed. (Patient not taking: Reported on 09/12/2016) 1 Inhaler 11  . aspirin 81 MG tablet Take 81 mg by mouth daily.    . benazepril (LOTENSIN) 10 MG tablet TAKE ONE TABLET BY MOUTH ONCE DAILY (Patient taking differently: TAKE ONE TABLET BY MOUTH ONCE DAILY AT BEDTIME) 90 tablet 3  . carisoprodol (SOMA) 350 MG tablet TAKE ONE TABLET BY MOUTH AT BEDTIME 90 tablet 1  . cetirizine (ZYRTEC) 10 MG tablet Take 10 mg by mouth daily.    . Cholecalciferol (VITAMIN D3) 1000 UNITS tablet Take 2,000 Units by mouth daily.     . citalopram (CELEXA) 40 MG tablet TAKE ONE TABLET BY MOUTH ONCE DAILY 90 tablet 3  . Fluticasone-Salmeterol (ADVAIR DISKUS) 100-50 MCG/DOSE AEPB Inhale 1 puff into the lungs every 12 (twelve) hours. 60 each 3  . furosemide (LASIX) 20 MG tablet TAKE ONE TABLET BY MOUTH ONCE DAILY 90 tablet 2  . gabapentin (NEURONTIN) 300 MG capsule Take 1 capsule (300 mg total) by mouth 3 (three) times a week. (Patient taking differently: Take 300 mg by mouth at bedtime. ) 12 capsule 11  . levothyroxine (SYNTHROID, LEVOTHROID) 150 MCG tablet Take 150 mcg by mouth daily before breakfast.    . metoprolol tartrate (LOPRESSOR) 25 MG tablet Take 1 tablet (25 mg total) by mouth 2 (two) times daily. 180 tablet 1  . montelukast (SINGULAIR) 10 MG tablet Take 10 mg by mouth  every morning.     . traZODone (DESYREL) 50 MG tablet TAKE ONE-HALF TO ONE TABLET BY MOUTH AT BEDTIME AS NEEDED FOR SLEEP 90 tablet 1  . Turmeric 500 MG CAPS Take 1 capsule by mouth 2 (two) times daily.    . vitamin B-12 (CYANOCOBALAMIN) 500 MCG tablet Take 500 mcg by mouth daily.    . ACETAMINOPHEN-BUTALBITAL 50-325 MG TABS Take 1 tablet by mouth at bedtime. Reported on 09/03/2015 3 times a week    . apixaban (ELIQUIS) 2.5 MG TABS tablet Take 1 tablet (2.5 mg total) by mouth 2 (two) times daily. 60 tablet 3  . EPINEPHrine (EPI-PEN) 0.3 mg/0.3 mL DEVI Inject 0.3 mLs (0.3 mg total) into the muscle once. (Patient not taking: Reported on 09/03/2016) 1 Device 2  . HYDROmorphone (DILAUDID) 2 MG tablet Take 1 tablet (2 mg total) by mouth every 4 (four) hours as needed for severe pain. 30 tablet 0  . ondansetron (ZOFRAN) 4 MG tablet Take 1 tablet (4 mg total) by mouth every 6 (six) hours as needed for nausea. (Patient not taking: Reported on 09/12/2016) 40 tablet 0  . ondansetron (ZOFRAN) 4 MG tablet Take 1 tablet (4 mg total) by mouth every 8 (eight) hours as needed for nausea or vomiting. 20 tablet 0  . traMADol (ULTRAM) 50 MG tablet Take 1 tablet (50 mg total) by mouth every 6 (six) hours as needed for moderate pain. (Patient not taking: Reported on 09/12/2016) 40 tablet 0   No current facility-administered medications for this visit.     Review of Systems:  GENERAL:  Feels "ok".  No fevers, sweats  or weight loss. PERFORMANCE STATUS (ECOG):  1 HEENT:  No visual changes, runny nose, sore throat, mouth sores or tenderness. Lungs: No shortness of breath or cough.  No hemoptysis. Cardiac:  No chest pain, palpitations, orthopnea, or PND. GI:  No nausea, vomiting, diarrhea, constipation, melena or hematochezia. GU:  No urgency, frequency, dysuria, or hematuria. Musculoskeletal:  No back pain.  No joint pain.  No muscle tenderness. Extremities:  No pain or swelling. Skin:  No rashes or skin  changes. Neuro:  No headache, numbness or weakness, balance or coordination issues. Endocrine:  No diabetes, thyroid issues, hot flashes or night sweats. Psych:  No mood changes, depression or anxiety. Pain:  No focal pain. Review of systems:  All other systems reviewed and found to be negative.  Physical Exam: Blood pressure 114/83, pulse (!) 51, temperature 97.6 F (36.4 C), temperature source Tympanic, resp. rate 18, height '5\' 9"'$  (1.753 m), weight 233 lb 0.4 oz (105.7 kg). GENERAL:  Well developed, well nourished, woman sitting comfortably in the exam room in no acute distress. MENTAL STATUS:  Alert and oriented to person, place and time. HEAD:  Short blonde hair.  Normocephalic, atraumatic, face symmetric, no Cushingoid features. EYES:  Glasses.  Blue eyes.  Pupils equal round and reactive to light and accomodation.  No conjunctivitis or scleral icterus. ENT:  Oropharynx clear without lesion.  Tongue normal. Mucous membranes moist.  RESPIRATORY:  Clear to auscultation without rales, wheezes or rhonchi. CARDIOVASCULAR:  Regular rate and rhythm without murmur, rub or gallop. ABDOMEN:  Soft, non-tender, with active bowel sounds, and no appreciable hepatosplenomegaly.  No masses. SKIN:  No rashes, ulcers or lesions. EXTREMITIES: No edema, no skin discoloration or tenderness.  No palpable cords. LYMPH NODES: No palpable cervical, supraclavicular, axillary or inguinal adenopathy  NEUROLOGICAL: Unremarkable. PSYCH:  Appropriate.   Appointment on 09/03/2016  Component Date Value Ref Range Status  . WBC 09/03/2016 5.2  3.6 - 11.0 K/uL Final  . RBC 09/03/2016 4.49  3.80 - 5.20 MIL/uL Final  . Hemoglobin 09/03/2016 13.3  12.0 - 16.0 g/dL Final  . HCT 09/03/2016 39.4  35.0 - 47.0 % Final  . MCV 09/03/2016 87.7  80.0 - 100.0 fL Final  . MCH 09/03/2016 29.6  26.0 - 34.0 pg Final  . MCHC 09/03/2016 33.7  32.0 - 36.0 g/dL Final  . RDW 09/03/2016 12.2  11.5 - 14.5 % Final  . Platelets  09/03/2016 215  150 - 440 K/uL Final  . Neutrophils Relative % 09/03/2016 57  % Final  . Neutro Abs 09/03/2016 3.0  1.4 - 6.5 K/uL Final  . Lymphocytes Relative 09/03/2016 34  % Final  . Lymphs Abs 09/03/2016 1.8  1.0 - 3.6 K/uL Final  . Monocytes Relative 09/03/2016 6  % Final  . Monocytes Absolute 09/03/2016 0.3  0.2 - 0.9 K/uL Final  . Eosinophils Relative 09/03/2016 2  % Final  . Eosinophils Absolute 09/03/2016 0.1  0 - 0.7 K/uL Final  . Basophils Relative 09/03/2016 1  % Final  . Basophils Absolute 09/03/2016 0.0  0 - 0.1 K/uL Final  . Sodium 09/03/2016 135  135 - 145 mmol/L Final  . Potassium 09/03/2016 3.6  3.5 - 5.1 mmol/L Final  . Chloride 09/03/2016 102  101 - 111 mmol/L Final  . CO2 09/03/2016 26  22 - 32 mmol/L Final  . Glucose, Bld 09/03/2016 98  65 - 99 mg/dL Final  . BUN 09/03/2016 13  6 - 20 mg/dL Final  . Creatinine,  Ser 09/03/2016 0.95  0.44 - 1.00 mg/dL Final  . Calcium 09/03/2016 8.6* 8.9 - 10.3 mg/dL Final  . Total Protein 09/03/2016 7.1  6.5 - 8.1 g/dL Final  . Albumin 09/03/2016 4.1  3.5 - 5.0 g/dL Final  . AST 09/03/2016 20  15 - 41 U/L Final  . ALT 09/03/2016 15  14 - 54 U/L Final  . Alkaline Phosphatase 09/03/2016 146* 38 - 126 U/L Final  . Total Bilirubin 09/03/2016 0.8  0.3 - 1.2 mg/dL Final  . GFR calc non Af Amer 09/03/2016 >60  >60 mL/min Final  . GFR calc Af Amer 09/03/2016 >60  >60 mL/min Final   Comment: (NOTE) The eGFR has been calculated using the CKD EPI equation. This calculation has not been validated in all clinical situations. eGFR's persistently <60 mL/min signify possible Chronic Kidney Disease.   . Anion gap 09/03/2016 7  5 - 15 Final  . PTT Lupus Anticoagulant 09/03/2016 33.1  0.0 - 51.9 sec Final  . DRVVT 09/03/2016 37.3  0.0 - 47.0 sec Final  . Lupus Anticoag Interp 09/03/2016 Comment:   Corrected   Comment: (NOTE) No lupus anticoagulant was detected. Performed At: [facility] [place], [place]  [id number] Lindon Romp MD [id number]   . Anticardiolipin IgG 09/03/2016 <9  0 - 14 GPL U/mL Final   Comment: (NOTE)                          Negative:              <15                          Indeterminate:     15 - 20                          Low-Med Positive: >20 - 80                          High Positive:         >80   . Anticardiolipin IgM 09/03/2016 <9  0 - 12 MPL U/mL Final   Comment: (NOTE)                          Negative:              <13                          Indeterminate:     13 - 20                          Low-Med Positive: >20 - 80                          High Positive:         >80   . Anticardiolipin IgA 09/03/2016 <9  0 - 11 APL U/mL Final   Comment: (NOTE)                          Negative:              <12  Indeterminate:     12 - 20                          Low-Med Positive: >20 - 80                          High Positive:         >80 Performed At: [facility] [place], [place] [id number] Lindon Romp MD OM:[id number]   . Beta-2 Glyco I IgG 09/03/2016 <9  0 - 20 GPI IgG units Final   Comment: (NOTE) The reference interval reflects a 3SD or 99th percentile interval, which is thought to represent a potentially clinically significant result in accordance with the International Consensus Statement on the classification criteria for definitive antiphospholipid syndrome (APS). J Thromb Haem 2006;4:295-306.   . Beta-2-Glycoprotein I IgM 09/03/2016 <9  0 - 32 GPI IgM units Final   Comment: (NOTE) The reference interval reflects a 3SD or 99th percentile interval, which is thought to represent a potentially clinically significant result in accordance with the International Consensus Statement on the classification criteria for definitive antiphospholipid syndrome (APS). J Thromb Haem 2006;4:295-306. Performed At: [facility] [address], [place] [id number] Lindon Romp MD  [id number]   . Beta-2-Glycoprotein I IgA 09/03/2016 <9  0 - 25 GPI IgA units Final   Comment: (NOTE) The reference interval reflects a 3SD or 99th percentile interval, which is thought to represent a potentially clinically significant result in accordance with the International Consensus Statement on the classification criteria for definitive antiphospholipid syndrome (APS). J Thromb Haem 2006;4:295-306.   Office Visit on 09/02/2016  Component Date Value Ref Range Status  . Specific Gravity, UA 09/02/2016 1.010  1.005 - 1.030 Final  . pH, UA 09/02/2016 5.0  5.0 - 7.5 Final  . Color, UA 09/02/2016 Yellow  Yellow Final  . Appearance Ur 09/02/2016 Clear  Clear Final  . Leukocytes, UA 09/02/2016 Trace* Negative Final  . Protein, UA 09/02/2016 Negative  Negative/Trace Final  . Glucose, UA 09/02/2016 Negative  Negative Final  . Ketones, UA 09/02/2016 Negative  Negative Final  . RBC, UA 09/02/2016 Negative  Negative Final  . Bilirubin, UA 09/02/2016 Negative  Negative Final  . Urobilinogen, Ur 09/02/2016 0.2  0.2 - 1.0 mg/dL Final  . Nitrite, UA 09/02/2016 Negative  Negative Final  . Microscopic Examination 09/02/2016 See below:   Final  . Scan Result 09/02/2016 0   Final  . WBC, UA 09/02/2016 0-5  0 - 5 /hpf Final  . RBC, UA 09/02/2016 None seen  0 - 2 /hpf Final  . Epithelial Cells (non renal) 09/02/2016 0-10  0 - 10 /hpf Final  . Mucus, UA 09/02/2016 Present* Not Estab. Final  . Bacteria, UA 09/02/2016 Few* None seen/Few Final    Assessment:  Claire Strong is a 56 y.o. female s/p gastric bypass with a history of DVT 2 with 2 pregnancies. The second pregnancy was in 1988. She developed a right lower extremity DVT post-partum.  She had a C-section and was at bedrest.  Clot was in the right calf.  She denies anticoagulation.  She was treated with warm compresses.    She has a history of clots below an IVC filter in 2006 after gastric bypass surgery. Lower extremity duplex was  negative.  She was on Lovenox 40 mg BID.  Six months later, her IVC filter was removed.  She has a history of a lithotripsy in 06/2008.  She had a right ureter double-J stent placed on 07/16/2016.  She has a horse shoe kidney.  She developed an acute drop in hemoglobin. CT and MRI revealed a large perinephric and retroperitoneal bleed on 07/18/2008.  Creatinine increased to 3.0.  She was on ferrous sulfate for many months to correct her subsequent iron deficiency anemia.  She was on Lovenox BID.  Her lupus was "in remission".  Work-up at that time revealed an INR of 1.2 and PTT of 48.  PT with mix corrected.  She received vitamin K.  PTT with mix did not correct.  She was felt to have a lupus anticoagulant. Bleeding diathesis work-up revealed the following normal studies:  factor IX activity, factor VIII activity, and von Willebrand panel.  She has foot surgery with joint fusion in 2016.  A second IVC filter was placed and removed 6 months later.  She was on Lovenox 40 mg BID until she was ambulating normally.   Family history is notable for a daughter with a lupus anticoagulant.  She has had DVT x 3.  First clot was at 15 or 56 years of age.  She states that she "can't take a blood thinner".  She has a history of iron deficiency anemia requiring Venofer x 2 (05/2016 and 06/2016).  She has a history of gastric ulcer.  She takes oral B12.  She is not on anticoagulation.  Plan: 1.  Review complicated medical history.  Discuss plan to obtain records from 2010.  She has a lupus anticoagulant by history.  Unclear if repeat testing was negative as patients with a lupus anticoagulant/antiphospholipid antibody syndrome require life long anticoagulation.  Discuss repeat lupus anticoagulant testing.  Discuss prophylactic anticoagulation as she has received in the past post operatively to prevent clot until fully ambulatory.  Obtain pharmacy records. 2.  Discuss plan for IVC filter placement on 09/09/2016. 3.   Labs today:  CBC with diff, CMP, lupus anticoagulant panel, anticardiolipin antibodies, beta2-glycoprotein. 4.  RTC prn.  Addendum:  Lupus anticoagulant work-up is negative.  Patient should receive prophylactic anticoagulation post procedure until fully ambulatory.  Spoke to Dr Delana Meyer who placed the IVC filter on 09/09/2016.  Discussed Lovenox versus Eliquis.  Discussed Eliquis 2.5 mg BID.  Patient will have the IVC filter in for 3 months.  Eliquis will be discontinued 2 weeks after the filter is removed.  Discussed with Dr. Vickki Muff.   Lequita Asal, MD  09/03/2016

## 2016-09-04 ENCOUNTER — Encounter
Admission: RE | Admit: 2016-09-04 | Discharge: 2016-09-04 | Disposition: A | Discharge status: Home / Self Care | Payer: BLUE CROSS/BLUE SHIELD | Source: Ambulatory Visit | Attending: Podiatry | Admitting: Podiatry

## 2016-09-04 DIAGNOSIS — Z01812 Encounter for preprocedural laboratory examination: Secondary | ICD-10-CM | POA: Diagnosis not present

## 2016-09-04 DIAGNOSIS — M19072 Primary osteoarthritis, left ankle and foot: Secondary | ICD-10-CM | POA: Diagnosis not present

## 2016-09-04 HISTORY — DX: Personal history of urinary calculi: Z87.442

## 2016-09-04 HISTORY — DX: Anxiety disorder, unspecified: F41.9

## 2016-09-04 LAB — LUPUS ANTICOAGULANT PANEL
DRVVT: 37.3 s (ref 0.0–47.0)
PTT Lupus Anticoagulant: 33.1 s (ref 0.0–51.9)

## 2016-09-04 LAB — SURGICAL PCR SCREEN
MRSA, PCR: POSITIVE — AB
Staphylococcus aureus: POSITIVE — AB

## 2016-09-04 NOTE — Patient Instructions (Addendum)
Your procedure is scheduled on: September 12, 2016 (Friday) Report to Same Day Surgery 2nd floor medical mall [facility] Entrance-take elevator on left to 2nd floor.  Check in with surgery information desk.) To find out your arrival time please call [phone] between 1PM - 3PM on  September 11, 2016 (Thursday)  Remember: Instructions that are not followed completely may result in serious medical risk, up to and including death, or upon the discretion of your surgeon and anesthesiologist your surgery may need to be rescheduled.    _x___ 1. Do not eat food or drink liquids after midnight. No gum chewing or  hard candies.                             __x__ 2. No Alcohol for 24 hours before or after surgery.   __x__3. No Smoking for 24 prior to surgery.   ____  4. Bring all medications with you on the day of surgery if instructed.    __x__ 5. Notify your doctor if there is any change in your medical condition     (cold, fever, infections).     Do not wear jewelry, make-up, hairpins, clips or nail polish.  Do not wear lotions, powders, or perfumes. You may wear deodorant.  Do not shave 48 hours prior to surgery. Men may shave face and neck.  Do not bring valuables to the hospital.    [facility] is not responsible for any belongings or valuables.               Contacts, dentures or bridgework may not be worn into surgery.  Leave your suitcase in the car. After surgery it may be brought to your room.  For patients admitted to the hospital, discharge time is determined by your                       treatment team.   Patients discharged the day of surgery will not be allowed to drive home.  You will need someone to drive you home and stay with you the night of your procedure.    Please read over the following fact sheets that you were given:   [facility] Preparing for Surgery and or MRSA Information   _x___ Take anti-hypertensive (unless it includes a diuretic), cardiac, seizure, asthma,      anti-reflux and psychiatric medicines. These include:  1. CELEXA  2. LEVOTHYROXINE  3. SINGULAIR  4. METOPROLOL  5.  6.  ____Fleets enema or Magnesium Citrate as directed.   _x___ Use CHG Soap or sage wipes as directed on instruction sheet   __x__ Use inhalers on the day of surgery and bring to hospital day of surgery (USE ADVAIR AND ALBUTEROL INHALERS THE MORNING OF SURGERY AND BRING TO HOSPITAL)   ____ Stop Metformin and Janumet 2 days prior to surgery.    ____ Take 1/2 of usual insulin dose the night before surgery and none on the morning surgery.      _x___ Follow recommendations from Cardiologist, Pulmonologist or PCP regarding          stopping Aspirin, Coumadin, Pllavix ,Eliquis, Effient, or Pradaxa, and Pletal. (CALL PCP AND ASK REGARDING STOPPING ASPIRIN FOR SURGERY)  X____Stop Anti-inflammatories such as Advil, Aleve, Ibuprofen, Motrin, Naproxen, Naprosyn, Goodies powders or aspirin products. OK to take Tylenol .   _x___ Stop supplements until after surgery.  But may continue Vitamin D, Vitamin B,  and multivitamin.   .   ____ Bring C-Pap to the hospital.

## 2016-09-05 ENCOUNTER — Ambulatory Visit (INDEPENDENT_AMBULATORY_CARE_PROVIDER_SITE_OTHER): Payer: BLUE CROSS/BLUE SHIELD

## 2016-09-05 DIAGNOSIS — I739 Peripheral vascular disease, unspecified: Secondary | ICD-10-CM | POA: Diagnosis not present

## 2016-09-05 DIAGNOSIS — I872 Venous insufficiency (chronic) (peripheral): Secondary | ICD-10-CM | POA: Diagnosis not present

## 2016-09-05 LAB — CARDIOLIPIN ANTIBODIES, IGG, IGM, IGA
Anticardiolipin IgA: <9 APL U/mL (ref 0–11)
Anticardiolipin IgG: <9 GPL U/mL (ref 0–14)
Anticardiolipin IgM: <9 MPL U/mL (ref 0–12)

## 2016-09-05 LAB — BETA-2-GLYCOPROTEIN I ABS, IGG/M/A
Beta-2 Glyco I IgG: <9 GPI IgG units (ref 0–20)
Beta-2-Glycoprotein I IgA: <9 GPI IgA units (ref 0–25)
Beta-2-Glycoprotein I IgM: <9 GPI IgM units (ref 0–32)

## 2016-09-05 NOTE — Pre-Procedure Instructions (Signed)
Positive MRSA/ [facility] results  called and faxed to Dr. Ether Griffins office (spoke to [place]).

## 2016-09-08 ENCOUNTER — Other Ambulatory Visit: Payer: Self-pay | Admitting: Vascular Surgery

## 2016-09-08 MED ORDER — VANCOMYCIN HCL 10 G IV SOLR: 1000.0000 mg | INTRAVENOUS | Status: AC

## 2016-09-08 MED ORDER — VANCOMYCIN HCL 10 G IV SOLR
1000.0000 mg | Freq: Once | INTRAVENOUS | Status: AC
  Administered 2016-09-12: 1000 mg via INTRAVENOUS

## 2016-09-09 ENCOUNTER — Telehealth: Payer: Self-pay | Admitting: Vascular Surgery

## 2016-09-09 ENCOUNTER — Encounter
Admission: RE | Disposition: A | Discharge status: Home / Self Care | Payer: Self-pay | Source: Ambulatory Visit | Attending: Vascular Surgery

## 2016-09-09 ENCOUNTER — Ambulatory Visit: Payer: BLUE CROSS/BLUE SHIELD | Admitting: Family Medicine

## 2016-09-09 ENCOUNTER — Ambulatory Visit
Admission: RE | Admit: 2016-09-09 | Discharge: 2016-09-09 | Disposition: A | Discharge status: Home / Self Care | Payer: BLUE CROSS/BLUE SHIELD | Source: Ambulatory Visit | Attending: Vascular Surgery | Admitting: Vascular Surgery

## 2016-09-09 DIAGNOSIS — Z8711 Personal history of peptic ulcer disease: Secondary | ICD-10-CM | POA: Insufficient documentation

## 2016-09-09 DIAGNOSIS — M1990 Unspecified osteoarthritis, unspecified site: Secondary | ICD-10-CM | POA: Diagnosis not present

## 2016-09-09 DIAGNOSIS — Z882 Allergy status to sulfonamides status: Secondary | ICD-10-CM | POA: Insufficient documentation

## 2016-09-09 DIAGNOSIS — E0590 Thyrotoxicosis, unspecified without thyrotoxic crisis or storm: Secondary | ICD-10-CM | POA: Diagnosis not present

## 2016-09-09 DIAGNOSIS — Z9884 Bariatric surgery status: Secondary | ICD-10-CM | POA: Insufficient documentation

## 2016-09-09 DIAGNOSIS — Z87442 Personal history of urinary calculi: Secondary | ICD-10-CM | POA: Diagnosis not present

## 2016-09-09 DIAGNOSIS — Z90711 Acquired absence of uterus with remaining cervical stump: Secondary | ICD-10-CM | POA: Insufficient documentation

## 2016-09-09 DIAGNOSIS — E890 Postprocedural hypothyroidism: Secondary | ICD-10-CM | POA: Diagnosis not present

## 2016-09-09 DIAGNOSIS — M216X2 Other acquired deformities of left foot: Secondary | ICD-10-CM | POA: Diagnosis not present

## 2016-09-09 DIAGNOSIS — Q631 Lobulated, fused and horseshoe kidney: Secondary | ICD-10-CM | POA: Insufficient documentation

## 2016-09-09 DIAGNOSIS — I872 Venous insufficiency (chronic) (peripheral): Secondary | ICD-10-CM | POA: Insufficient documentation

## 2016-09-09 DIAGNOSIS — Z91018 Allergy to other foods: Secondary | ICD-10-CM | POA: Insufficient documentation

## 2016-09-09 DIAGNOSIS — Z833 Family history of diabetes mellitus: Secondary | ICD-10-CM | POA: Insufficient documentation

## 2016-09-09 DIAGNOSIS — Z888 Allergy status to other drugs, medicaments and biological substances status: Secondary | ICD-10-CM | POA: Insufficient documentation

## 2016-09-09 DIAGNOSIS — Z981 Arthrodesis status: Secondary | ICD-10-CM | POA: Diagnosis not present

## 2016-09-09 DIAGNOSIS — M797 Fibromyalgia: Secondary | ICD-10-CM | POA: Diagnosis not present

## 2016-09-09 DIAGNOSIS — Z801 Family history of malignant neoplasm of trachea, bronchus and lung: Secondary | ICD-10-CM | POA: Insufficient documentation

## 2016-09-09 DIAGNOSIS — Z87891 Personal history of nicotine dependence: Secondary | ICD-10-CM | POA: Insufficient documentation

## 2016-09-09 DIAGNOSIS — Z9889 Other specified postprocedural states: Secondary | ICD-10-CM | POA: Insufficient documentation

## 2016-09-09 DIAGNOSIS — I739 Peripheral vascular disease, unspecified: Secondary | ICD-10-CM | POA: Insufficient documentation

## 2016-09-09 DIAGNOSIS — Z96652 Presence of left artificial knee joint: Secondary | ICD-10-CM | POA: Insufficient documentation

## 2016-09-09 DIAGNOSIS — Z86718 Personal history of other venous thrombosis and embolism: Secondary | ICD-10-CM | POA: Diagnosis not present

## 2016-09-09 DIAGNOSIS — Z96641 Presence of right artificial hip joint: Secondary | ICD-10-CM | POA: Insufficient documentation

## 2016-09-09 DIAGNOSIS — I10 Essential (primary) hypertension: Secondary | ICD-10-CM | POA: Insufficient documentation

## 2016-09-09 DIAGNOSIS — Z885 Allergy status to narcotic agent status: Secondary | ICD-10-CM | POA: Diagnosis not present

## 2016-09-09 DIAGNOSIS — I2699 Other pulmonary embolism without acute cor pulmonale: Secondary | ICD-10-CM | POA: Diagnosis not present

## 2016-09-09 DIAGNOSIS — Z85820 Personal history of malignant melanoma of skin: Secondary | ICD-10-CM | POA: Diagnosis not present

## 2016-09-09 DIAGNOSIS — Z9049 Acquired absence of other specified parts of digestive tract: Secondary | ICD-10-CM | POA: Insufficient documentation

## 2016-09-09 DIAGNOSIS — Z811 Family history of alcohol abuse and dependence: Secondary | ICD-10-CM | POA: Insufficient documentation

## 2016-09-09 DIAGNOSIS — Z808 Family history of malignant neoplasm of other organs or systems: Secondary | ICD-10-CM | POA: Insufficient documentation

## 2016-09-09 DIAGNOSIS — D6862 Lupus anticoagulant syndrome: Secondary | ICD-10-CM | POA: Diagnosis not present

## 2016-09-09 DIAGNOSIS — K5790 Diverticulosis of intestine, part unspecified, without perforation or abscess without bleeding: Secondary | ICD-10-CM | POA: Diagnosis not present

## 2016-09-09 DIAGNOSIS — Z8249 Family history of ischemic heart disease and other diseases of the circulatory system: Secondary | ICD-10-CM | POA: Insufficient documentation

## 2016-09-09 HISTORY — PX: IVC FILTER INSERTION: CATH118245

## 2016-09-09 SURGERY — IVC FILTER INSERTION
Anesthesia: Moderate Sedation

## 2016-09-09 MED ORDER — ONDANSETRON HCL 4 MG/2ML IJ SOLN
INTRAMUSCULAR | Status: AC
  Filled 2016-09-09: qty 2

## 2016-09-09 MED ORDER — SODIUM CHLORIDE 0.9 % IV SOLN
INTRAVENOUS | Status: DC
  Administered 2016-09-09: 08:00:00 via INTRAVENOUS

## 2016-09-09 MED ORDER — LIDOCAINE HCL (PF) 1 % IJ SOLN
INTRAMUSCULAR | Status: AC
  Filled 2016-09-09: qty 30

## 2016-09-09 MED ORDER — MIDAZOLAM HCL 2 MG/2ML IJ SOLN
INTRAMUSCULAR | Status: DC | PRN
  Administered 2016-09-09: 1 mg via INTRAVENOUS
  Administered 2016-09-09: 2 mg via INTRAVENOUS

## 2016-09-09 MED ORDER — SODIUM CHLORIDE 0.9 % IV SOLN
500.0000 mL | Freq: Once | INTRAVENOUS | Status: DC | PRN
Start: 2016-09-09 — End: 2016-09-09

## 2016-09-09 MED ORDER — CLINDAMYCIN PHOSPHATE 300 MG/50ML IV SOLN: 300.0000 mg | Freq: Once | INTRAVENOUS | Status: DC

## 2016-09-09 MED ORDER — ONDANSETRON HCL 4 MG/2ML IJ SOLN
4.0000 mg | Freq: Once | INTRAMUSCULAR | Status: AC
  Administered 2016-09-09: 4 mg via INTRAVENOUS

## 2016-09-09 MED ORDER — SODIUM CHLORIDE 0.9 % IV SOLN: 1.0000 mL/kg/h | INTRAVENOUS | Status: DC

## 2016-09-09 MED ORDER — FENTANYL CITRATE (PF) 100 MCG/2ML IJ SOLN
INTRAMUSCULAR | Status: AC
  Filled 2016-09-09: qty 4

## 2016-09-09 MED ORDER — CLINDAMYCIN PHOSPHATE 300 MG/50ML IV SOLN
INTRAVENOUS | Status: AC
  Administered 2016-09-09: 300 mg
  Filled 2016-09-09: qty 50

## 2016-09-09 MED ORDER — MIDAZOLAM HCL 5 MG/5ML IJ SOLN
INTRAMUSCULAR | Status: AC
  Filled 2016-09-09: qty 5

## 2016-09-09 MED ORDER — FENTANYL CITRATE (PF) 100 MCG/2ML IJ SOLN
INTRAMUSCULAR | Status: DC | PRN
  Administered 2016-09-09 (×2): 50 ug via INTRAVENOUS

## 2016-09-09 MED ORDER — HEPARIN SODIUM (PORCINE) 1000 UNIT/ML IJ SOLN
INTRAMUSCULAR | Status: AC
  Filled 2016-09-09: qty 1

## 2016-09-09 SURGICAL SUPPLY — 5 items
KIT FEMORAL DEL DENALI (Miscellaneous) ×2 IMPLANT
NEEDLE ENTRY 21GA 7CM ECHOTIP (NEEDLE) ×2 IMPLANT
PACK ANGIOGRAPHY (CUSTOM PROCEDURE TRAY) ×2 IMPLANT
SET INTRO CAPELLA COAXIAL (SET/KITS/TRAYS/PACK) ×2 IMPLANT
WIRE J 3MM .035X145CM (WIRE) ×2 IMPLANT

## 2016-09-09 NOTE — H&P (Signed)
[facility] History & Physical Update  The patient was interviewed and re-examined.  The patient's previous History and Physical has been reviewed and is unchanged.  There is no change in the plan of care. We plan to proceed with the scheduled procedure.  Levora Dredge, MD  09/09/2016, 7:49 AM

## 2016-09-09 NOTE — Discharge Instructions (Signed)
Groin Insertion Instructions-If you lose feeling or develop tingling or pain in your leg or foot after the procedure, please walk around first.  If the discomfort does not improve , contact your physician and proceed to the nearest emergency room.  Loss of feeling in your leg might mean that a blockage has formed in the artery and this can be appropriately treated.  Limit your activity for the next two days after your procedure.  Avoid stooping, bending, heavy lifting or exertion as this may put pressure on the insertion site.  Resume normal activities in 48 hours.  You may shower after 24 hours but avoid excessive warm water and do not scrub the site.  Remove clear dressing in 48 hours.  If you have had a closure device inserted, do not soak in a tub bath or a hot tub for at least one week.  No driving for 48 hours after discharge.  After the procedure, check the insertion site occasionally.  If any oozing occurs or there is apparent swelling, firm pressure over the site will prevent a bruise from forming.  You can not hurt anything by pressing directly on the site.  The pressure stops the bleeding by allowing a small clot to form.  If the bleeding continues after the pressure has been applied for more than 15 minutes, call 911 or go to the nearest emergency room.    The x-ray dye causes you to pass a considerate amount of urine.  For this reason, you will be asked to drink plenty of liquids after the procedure to prevent dehydration.  You may resume you regular diet.  Avoid caffeine products.    For pain at the site of your procedure, take non-aspirin medicines such as Tylenol.  Medications: A. Hold Metformin for 48 hours if applicable.  B. Continue taking all your present medications at home unless your doctor prescribes any changes. Inferior Vena Cava Filter Insertion, Care After This sheet gives you information about how to care for yourself after your procedure. Your health care provider may also  give you more specific instructions. If you have problems or questions, contact your health care provider. What can I expect after the procedure? After your procedure, it is common to have:  Mild pain in the area where the filter was inserted.  Mild bruising in the area where the filter was inserted. Follow these instructions at home: Insertion site care   Follow instructions from your health care provider about how to take care of the site where a catheter was inserted at your neck or groin (insertion site). Make sure you:  Wash your hands with soap and water before you change your bandage (dressing). If soap and water are not available, use hand sanitizer.  Change your dressing as told by your health care provider.  Check your insertion site every day for signs of infection. Check for:  More redness, swelling, or pain.  More fluid or blood.  Warmth.  Pus or a bad smell.  Keep the insertion site clean and dry.  Do not shower, bathe, use a hot tub, or let the dressing get wet until your health care provider approves. General instructions   Take over-the-counter and prescription medicines only as told by your health care provider.  Avoid heavy lifting or hard activities for 48 hours after the procedure or as told by your health care provider.  Do not drive for 24 hours if you were given a a medicine to help you relax (sedative).  Do not drive or use heavy machinery while taking prescription pain medicine.  Do not go back to school or work until your health care provider approves.  Keep all follow-up visits as told by your health care provider. This is important. Contact a health care provider if:  You have more redness, swelling, or pain around your insertion site.  You have more fluid or blood coming from your insertion site.  Your insertion site feels warm to the touch.  You have pus or a bad smell coming from your insertion site.  You have a fever.  You are  dizzy.  You have nausea and vomiting.  You develop a rash. Get help right away if:  You develop chest pain, a cough, or difficulty breathing.  You develop shortness of breath, feel faint, or pass out.  You cough up blood.  You have severe pain in your abdomen.  You develop swelling and discoloration or pain in your legs.  Your legs become pale and cold or blue.  You develop weakness, difficulty moving your arms or legs, or balance problems.  You develop problems with speech or vision. These symptoms may represent a serious problem that is an emergency. Do not wait to see if the symptoms will go away. Get medical help right away. Call your local emergency services (911 in the [place] Do not drive yourself to the hospital. Summary  After your insertion procedure, it is common to have mild pain and bruising.  Do not shower, bathe, use a hot tub, or let the dressing get wet until your health care provider approves.  Every day, check for signs of infection where a catheter was inserted at your neck or groin (insertion site). This information is not intended to replace advice given to you by your health care provider. Make sure you discuss any questions you have with your health care provider. Document Released: 03/23/2013 Document Revised: 04/23/2016 Document Reviewed: 04/23/2016 Elsevier Interactive Patient Education  2017 [employer].

## 2016-09-09 NOTE — Op Note (Addendum)
[facility]   OPERATIVE NOTE    PRE-OPERATIVE DIAGNOSIS: DVT with PE  POST-OPERATIVE DIAGNOSIS: Same  PROCEDURE: 1.   Ultrasound guidance for vascular access to the right common femoral vein 2.   Catheter placement into the inferior vena cava 3.   Inferior venacavogram 4.   Placement of a Denali IVC filter  SURGEON: Levora Dredge  ASSISTANT(S): None  ANESTHESIA: Conscious sedation was administered by the interventional radiology RN under my direct supervision. IV Versed plus fentanyl were utilized. Continuous ECG, pulse oximetry and blood pressure was monitored throughout the entire procedure. Conscious sedation was for a total of 20 minutes.  ESTIMATED BLOOD LOSS: minimal  FINDING(S): 1.  Patent IVC  SPECIMEN(S):  none  INDICATIONS:   Claire Strong is a 56 y.o. y.o. 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  Inferior vena cava filter is indicated for this reason.  Risks and benefits including filter thrombosis, migration, fracture, bleeding, and infection were all discussed.  We discussed that all IVC filters that we place can be removed if desired from the patient once the need for the filter has passed.    DESCRIPTION: After obtaining full informed written consent, the patient was brought back to the vascular suite. The skin was sterilely prepped and draped in a sterile surgical field was created. The ultrasound was placed in a sterile sleeve.  The common femoral vein was echolucent and compressible indicating patency. Image was recorded for the permanent record. The puncture was made under continuous real-time ultrasound guidance.  The right common femoral was accessed under direct ultrasound guidance without difficulty with a micropuncture needle.  Microwire was then advanced under fluoroscopic guidance without difficulty. Micro-sheath was then inserted and a J-wire was then placed. The dilator is  passed over the wire and the delivery sheath was placed into the inferior vena cava.  Inferior venacavogram was performed. This demonstrated a patent IVC with the level of the renal veins at L2.  The filter was then deployed into the inferior vena cava at the level of L3 just below the renal veins. The delivery sheath was then removed. Pressure was held. Sterile dressings were placed. The patient tolerated the procedure well and was taken to the recovery room in stable condition.  Interpretation: Inferior vena cava is opacified with a bolus injection of contrast. The contrast refluxes into the renal veins bilaterally. These are located at the level of L2. There is a smooth taper or narrowing to the IVC just below this level for a short distance and then the cava returns to a more normal diameter at the level of L3. At the level of L3 the vena cava measures 18 mm in diameter. There are no filling defects noted no evidence of thrombus. Filter is deployed at the L3 level in good orientation  COMPLICATIONS: None  CONDITION: Stable  Levora Dredge  09/09/2016, 8:44 AM

## 2016-09-09 NOTE — Telephone Encounter (Signed)
Left message asking for call back to discuss ultrasound results.

## 2016-09-10 ENCOUNTER — Encounter: Payer: Self-pay | Admitting: Vascular Surgery

## 2016-09-10 ENCOUNTER — Encounter: Payer: Self-pay | Admitting: Family Medicine

## 2016-09-10 ENCOUNTER — Encounter: Payer: Self-pay | Admitting: *Deleted

## 2016-09-10 NOTE — Telephone Encounter (Signed)
Please order labs for husband per Mychart message. Thanks.

## 2016-09-10 NOTE — Telephone Encounter (Signed)
Labs ordered. The patient has not yet scheduled f/u OV.

## 2016-09-11 ENCOUNTER — Telehealth: Payer: Self-pay | Admitting: *Deleted

## 2016-09-11 ENCOUNTER — Ambulatory Visit
Admission: RE | Admit: 2016-09-11 | Discharge: 2016-09-11 | Disposition: A | Discharge status: Home / Self Care | Payer: BLUE CROSS/BLUE SHIELD | Source: Ambulatory Visit | Attending: Urology | Admitting: Urology

## 2016-09-11 DIAGNOSIS — Q631 Lobulated, fused and horseshoe kidney: Secondary | ICD-10-CM | POA: Insufficient documentation

## 2016-09-11 DIAGNOSIS — R109 Unspecified abdominal pain: Secondary | ICD-10-CM | POA: Diagnosis not present

## 2016-09-11 DIAGNOSIS — N200 Calculus of kidney: Secondary | ICD-10-CM | POA: Diagnosis not present

## 2016-09-11 MED ORDER — FAMOTIDINE 20 MG PO TABS
20.0000 mg | ORAL_TABLET | Freq: Once | ORAL | Status: AC
  Administered 2016-09-12: 20 mg via ORAL

## 2016-09-11 MED ORDER — VANCOMYCIN HCL IN DEXTROSE 1-5 GM/200ML-% IV SOLN: 1000.0000 mg | Freq: Once | INTRAVENOUS | Status: DC

## 2016-09-11 NOTE — Pre-Procedure Instructions (Signed)
Patient cleared for surgery by Dr. Merlene Pulling

## 2016-09-11 NOTE — Telephone Encounter (Signed)
Returned patients call and left message that Dr. Merlene Pulling had spoken with Dr. Ether Griffins and they will decide on anticoagulation.

## 2016-09-12 ENCOUNTER — Ambulatory Visit: Payer: BLUE CROSS/BLUE SHIELD | Admitting: Anesthesiology

## 2016-09-12 ENCOUNTER — Ambulatory Visit
Admission: RE | Admit: 2016-09-12 | Discharge: 2016-09-12 | Disposition: A | Discharge status: Home / Self Care | Payer: BLUE CROSS/BLUE SHIELD | Source: Ambulatory Visit | Attending: Podiatry | Admitting: Podiatry

## 2016-09-12 ENCOUNTER — Encounter: Payer: Self-pay | Admitting: *Deleted

## 2016-09-12 ENCOUNTER — Encounter
Admission: RE | Disposition: A | Discharge status: Home / Self Care | Payer: Self-pay | Source: Ambulatory Visit | Attending: Podiatry

## 2016-09-12 DIAGNOSIS — Z79899 Other long term (current) drug therapy: Secondary | ICD-10-CM | POA: Diagnosis not present

## 2016-09-12 DIAGNOSIS — M797 Fibromyalgia: Secondary | ICD-10-CM | POA: Insufficient documentation

## 2016-09-12 DIAGNOSIS — E039 Hypothyroidism, unspecified: Secondary | ICD-10-CM | POA: Diagnosis not present

## 2016-09-12 DIAGNOSIS — F418 Other specified anxiety disorders: Secondary | ICD-10-CM | POA: Insufficient documentation

## 2016-09-12 DIAGNOSIS — Z7951 Long term (current) use of inhaled steroids: Secondary | ICD-10-CM | POA: Diagnosis not present

## 2016-09-12 DIAGNOSIS — M19072 Primary osteoarthritis, left ankle and foot: Secondary | ICD-10-CM | POA: Insufficient documentation

## 2016-09-12 DIAGNOSIS — J45909 Unspecified asthma, uncomplicated: Secondary | ICD-10-CM | POA: Diagnosis not present

## 2016-09-12 DIAGNOSIS — G8918 Other acute postprocedural pain: Secondary | ICD-10-CM | POA: Diagnosis not present

## 2016-09-12 DIAGNOSIS — I10 Essential (primary) hypertension: Secondary | ICD-10-CM | POA: Insufficient documentation

## 2016-09-12 DIAGNOSIS — D649 Anemia, unspecified: Secondary | ICD-10-CM | POA: Diagnosis not present

## 2016-09-12 DIAGNOSIS — M79672 Pain in left foot: Secondary | ICD-10-CM | POA: Diagnosis not present

## 2016-09-12 DIAGNOSIS — Z87891 Personal history of nicotine dependence: Secondary | ICD-10-CM | POA: Insufficient documentation

## 2016-09-12 HISTORY — PX: FUSION OF TALONAVICULAR JOINT: SHX6332

## 2016-09-12 SURGERY — FUSION, TALONAVICULAR JOINT
Anesthesia: General | Site: Foot | Laterality: Left | Wound class: Clean

## 2016-09-12 MED ORDER — MIDAZOLAM HCL 2 MG/2ML IJ SOLN
INTRAMUSCULAR | Status: AC
  Filled 2016-09-12: qty 2

## 2016-09-12 MED ORDER — ONDANSETRON HCL 4 MG PO TABS: 4.0000 mg | ORAL_TABLET | Freq: Four times a day (QID) | ORAL | Status: DC | PRN

## 2016-09-12 MED ORDER — THROMBIN 5000 UNITS EX SOLR
CUTANEOUS | Status: AC
  Filled 2016-09-12: qty 5000

## 2016-09-12 MED ORDER — ROPIVACAINE HCL 5 MG/ML IJ SOLN
INTRAMUSCULAR | Status: AC
  Filled 2016-09-12: qty 40

## 2016-09-12 MED ORDER — EPHEDRINE SULFATE 50 MG/ML IJ SOLN
INTRAMUSCULAR | Status: DC | PRN
  Administered 2016-09-12: 15 mg via INTRAVENOUS
  Administered 2016-09-12: 10 mg via INTRAVENOUS

## 2016-09-12 MED ORDER — HEMOSTATIC AGENTS (NO CHARGE) OPTIME
TOPICAL | Status: DC | PRN
  Administered 2016-09-12 (×2): 1 via TOPICAL

## 2016-09-12 MED ORDER — LIDOCAINE HCL (PF) 1 % IJ SOLN
INTRAMUSCULAR | Status: AC
  Filled 2016-09-12: qty 30

## 2016-09-12 MED ORDER — ONDANSETRON HCL 4 MG/2ML IJ SOLN: 4.0000 mg | Freq: Four times a day (QID) | INTRAMUSCULAR | Status: DC | PRN

## 2016-09-12 MED ORDER — MIDAZOLAM HCL 2 MG/2ML IJ SOLN
1.0000 mg | Freq: Once | INTRAMUSCULAR | Status: AC
  Administered 2016-09-12: 1 mg via INTRAVENOUS

## 2016-09-12 MED ORDER — LACTATED RINGERS IV SOLN
INTRAVENOUS | Status: DC
  Administered 2016-09-12 (×2): via INTRAVENOUS

## 2016-09-12 MED ORDER — LIDOCAINE HCL (CARDIAC) 20 MG/ML IV SOLN
INTRAVENOUS | Status: DC | PRN
  Administered 2016-09-12: 60 mg via INTRAVENOUS

## 2016-09-12 MED ORDER — HYDROMORPHONE HCL 2 MG PO TABS: 2.0000 mg | ORAL_TABLET | ORAL | 0 refills | Status: DC | PRN

## 2016-09-12 MED ORDER — MIDAZOLAM HCL 2 MG/2ML IJ SOLN
INTRAMUSCULAR | Status: DC | PRN
  Administered 2016-09-12: 1 mg via INTRAVENOUS
  Administered 2016-09-12 (×2): .5 mg via INTRAVENOUS

## 2016-09-12 MED ORDER — FENTANYL CITRATE (PF) 100 MCG/2ML IJ SOLN
INTRAMUSCULAR | Status: DC | PRN
  Administered 2016-09-12 (×3): 25 ug via INTRAVENOUS
  Administered 2016-09-12: 50 ug via INTRAVENOUS
  Administered 2016-09-12: 25 ug via INTRAVENOUS
  Administered 2016-09-12: 50 ug via INTRAVENOUS

## 2016-09-12 MED ORDER — FENTANYL CITRATE (PF) 100 MCG/2ML IJ SOLN: 25.0000 ug | INTRAMUSCULAR | Status: DC | PRN

## 2016-09-12 MED ORDER — SEVOFLURANE IN SOLN
RESPIRATORY_TRACT | Status: AC
  Filled 2016-09-12: qty 250

## 2016-09-12 MED ORDER — PROPOFOL 10 MG/ML IV BOLUS
INTRAVENOUS | Status: DC | PRN
  Administered 2016-09-12: 150 mg via INTRAVENOUS
  Administered 2016-09-12: 20 mg via INTRAVENOUS

## 2016-09-12 MED ORDER — FAMOTIDINE 20 MG PO TABS
ORAL_TABLET | ORAL | Status: AC
  Administered 2016-09-12: 20 mg via ORAL
  Filled 2016-09-12: qty 1

## 2016-09-12 MED ORDER — DEXAMETHASONE SODIUM PHOSPHATE 10 MG/ML IJ SOLN
INTRAMUSCULAR | Status: DC | PRN
  Administered 2016-09-12: 10 mg via INTRAVENOUS

## 2016-09-12 MED ORDER — THROMBIN 5000 UNITS EX SOLR
CUTANEOUS | Status: DC | PRN
  Administered 2016-09-12: 5000 [IU] via TOPICAL

## 2016-09-12 MED ORDER — FENTANYL CITRATE (PF) 100 MCG/2ML IJ SOLN
INTRAMUSCULAR | Status: AC
  Filled 2016-09-12: qty 2

## 2016-09-12 MED ORDER — ONDANSETRON HCL 4 MG/2ML IJ SOLN: 4.0000 mg | Freq: Once | INTRAMUSCULAR | Status: DC | PRN

## 2016-09-12 MED ORDER — ONDANSETRON HCL 4 MG PO TABS: 4.0000 mg | ORAL_TABLET | Freq: Three times a day (TID) | ORAL | 0 refills | Status: DC | PRN

## 2016-09-12 MED ORDER — FENTANYL CITRATE (PF) 100 MCG/2ML IJ SOLN
50.0000 ug | Freq: Once | INTRAMUSCULAR | Status: AC
  Administered 2016-09-12: 50 ug via INTRAVENOUS

## 2016-09-12 MED ORDER — LIDOCAINE HCL (PF) 2 % IJ SOLN
INTRAMUSCULAR | Status: AC
  Filled 2016-09-12: qty 2

## 2016-09-12 MED ORDER — PROPOFOL 10 MG/ML IV BOLUS
INTRAVENOUS | Status: AC
  Filled 2016-09-12: qty 20

## 2016-09-12 MED ORDER — LIDOCAINE HCL 2 % EX GEL
CUTANEOUS | Status: AC
  Filled 2016-09-12: qty 5

## 2016-09-12 MED ORDER — ONDANSETRON HCL 4 MG/2ML IJ SOLN
INTRAMUSCULAR | Status: DC | PRN
  Administered 2016-09-12: 4 mg via INTRAVENOUS

## 2016-09-12 MED ORDER — BUPIVACAINE-EPINEPHRINE (PF) 0.25% -1:200000 IJ SOLN
INTRAMUSCULAR | Status: DC | PRN
  Administered 2016-09-12: 30 mL via PERINEURAL

## 2016-09-12 MED ORDER — DEXAMETHASONE SODIUM PHOSPHATE 10 MG/ML IJ SOLN
INTRAMUSCULAR | Status: AC
  Filled 2016-09-12: qty 1

## 2016-09-12 MED ORDER — FENTANYL CITRATE (PF) 100 MCG/2ML IJ SOLN
INTRAMUSCULAR | Status: AC
  Administered 2016-09-12: 50 ug via INTRAVENOUS
  Filled 2016-09-12: qty 2

## 2016-09-12 MED ORDER — BUPIVACAINE HCL (PF) 0.5 % IJ SOLN
INTRAMUSCULAR | Status: AC
  Filled 2016-09-12: qty 30

## 2016-09-12 MED ORDER — MIDAZOLAM HCL 2 MG/2ML IJ SOLN
INTRAMUSCULAR | Status: AC
  Administered 2016-09-12: 1 mg via INTRAVENOUS
  Filled 2016-09-12: qty 2

## 2016-09-12 MED ORDER — APIXABAN 2.5 MG PO TABS: 2.5000 mg | ORAL_TABLET | Freq: Two times a day (BID) | ORAL | 3 refills | Status: DC

## 2016-09-12 MED ORDER — HYDROCODONE-ACETAMINOPHEN 7.5-325 MG PO TABS: 1.0000 | ORAL_TABLET | Freq: Four times a day (QID) | ORAL | 0 refills | Status: DC | PRN

## 2016-09-12 MED ORDER — ACETAMINOPHEN 10 MG/ML IV SOLN
INTRAVENOUS | Status: DC | PRN
  Administered 2016-09-12: 1000 mg via INTRAVENOUS

## 2016-09-12 MED ORDER — VANCOMYCIN HCL IN DEXTROSE 1-5 GM/200ML-% IV SOLN
INTRAVENOUS | Status: AC
  Filled 2016-09-12: qty 200

## 2016-09-12 MED ORDER — BUPIVACAINE-EPINEPHRINE (PF) 0.25% -1:200000 IJ SOLN
INTRAMUSCULAR | Status: AC
  Filled 2016-09-12: qty 30

## 2016-09-12 MED ORDER — ACETAMINOPHEN 10 MG/ML IV SOLN
INTRAVENOUS | Status: AC
  Filled 2016-09-12: qty 100

## 2016-09-12 SURGICAL SUPPLY — 59 items
BANDAGE ELASTIC 4 LF NS (GAUZE/BANDAGES/DRESSINGS) ×2 IMPLANT
BANDAGE STRETCH 3X4.1 STRL (GAUZE/BANDAGES/DRESSINGS) ×2 IMPLANT
BIT DRILL 2 FENESTRATED (MISCELLANEOUS) ×1 IMPLANT
BIT DRILLL 2 FENESTRATED (MISCELLANEOUS) ×1
BLADE OSCILLATING/SAGITTAL (BLADE) ×1
BLADE SURG 15 STRL LF DISP TIS (BLADE) ×2 IMPLANT
BLADE SURG 15 STRL SS (BLADE) ×2
BLADE SURG MINI STRL (BLADE) ×2 IMPLANT
BLADE SW THK.38XMED LNG THN (BLADE) ×1 IMPLANT
BNDG ESMARK 4X12 TAN STRL LF (GAUZE/BANDAGES/DRESSINGS) ×2 IMPLANT
BOWL CEMENT MIXING ADV NOZZLE (MISCELLANEOUS) ×4 IMPLANT
BUR 4X45 EGG (BURR) ×2 IMPLANT
CANISTER SUCT 1200ML W/VALVE (MISCELLANEOUS) ×2 IMPLANT
CHLORAPREP W/TINT 26ML (MISCELLANEOUS) ×2 IMPLANT
DRAPE FLUOR MINI C-ARM 54X84 (DRAPES) ×4 IMPLANT
DRAPE SURG 17X11 SM STRL (DRAPES) ×2 IMPLANT
ELECT REM PT RETURN 9FT ADLT (ELECTROSURGICAL) ×2
ELECTRODE REM PT RTRN 9FT ADLT (ELECTROSURGICAL) ×1 IMPLANT
GAUZE PETRO XEROFOAM 1X8 (MISCELLANEOUS) ×2 IMPLANT
GAUZE SPONGE 4X4 12PLY STRL (GAUZE/BANDAGES/DRESSINGS) ×2 IMPLANT
GAUZE XEROFORM 4X4 STRL (GAUZE/BANDAGES/DRESSINGS) ×2 IMPLANT
GLOVE BIO SURGEON STRL SZ7.5 (GLOVE) ×2 IMPLANT
GLOVE INDICATOR 8.0 STRL GRN (GLOVE) ×2 IMPLANT
GOWN STRL REUS W/ TWL LRG LVL3 (GOWN DISPOSABLE) ×2 IMPLANT
GOWN STRL REUS W/TWL LRG LVL3 (GOWN DISPOSABLE) ×2
GRAFT TRIN ELITE MED MUSC TRAN (Graft) ×2 IMPLANT
HEMOSTAT SURGICEL 2X3 (HEMOSTASIS) ×4 IMPLANT
K-WIRE SMOOTH TROCAR 2.0X150 (WIRE) ×2
KIT RM TURNOVER STRD PROC AR (KITS) ×2 IMPLANT
KIT STAPLE ARCUS 13X10 STRL (Staple) ×2 IMPLANT
KIT STAPLE ARCUS 16X13 STRL (Staple) ×2 IMPLANT
KIT STAPLE ARCUS 20X17 STRL (Staple) ×2 IMPLANT
KWIRE SMOOTH TROCAR 2.0X150 (WIRE) ×1 IMPLANT
NEEDLE HYPO 25X1 1.5 SAFETY (NEEDLE) ×2 IMPLANT
NS IRRIG 1000ML POUR BTL (IV SOLUTION) ×4 IMPLANT
NS IRRIG 500ML POUR BTL (IV SOLUTION) IMPLANT
PACK EXTREMITY ARMC (MISCELLANEOUS) ×2 IMPLANT
PAD CAST CTTN 4X4 STRL (SOFTGOODS) ×1 IMPLANT
PADDING CAST COTTON 4X4 STRL (SOFTGOODS) ×1
PENCIL ELECTRO HAND CTR (MISCELLANEOUS) ×2 IMPLANT
RASP SM TEAR CROSS CUT (RASP) ×2 IMPLANT
SOL PREP PVP 2OZ (MISCELLANEOUS) ×2
SOLUTION PREP PVP 2OZ (MISCELLANEOUS) ×1 IMPLANT
SPLINT FAST PLASTER 5X30 (CAST SUPPLIES) ×1
SPLINT PLASTER CAST FAST 5X30 (CAST SUPPLIES) ×1 IMPLANT
SPOGE SURGIFLO 8M (HEMOSTASIS) ×1
SPONGE SURGIFLO 8M (HEMOSTASIS) ×1 IMPLANT
SPONGE XRAY 4X4 16PLY STRL (MISCELLANEOUS) ×10 IMPLANT
STAPLE ASSEMBLY ARCUS 16X13 (Staple) ×4 IMPLANT
STAPLE ASSEMBLY ARCUS 20X17 (Staple) ×4 IMPLANT
STOCKINETTE STRL 6IN 960660 (GAUZE/BANDAGES/DRESSINGS) ×2 IMPLANT
STRIP CLOSURE SKIN 1/4X4 (GAUZE/BANDAGES/DRESSINGS) ×2 IMPLANT
SUT ETHILON 5-0 FS-2 18 BLK (SUTURE) ×2 IMPLANT
SUT VIC AB 2-0 CT2 27 (SUTURE) ×2 IMPLANT
SUT VIC AB 2-0 SH 27 (SUTURE) ×1
SUT VIC AB 2-0 SH 27XBRD (SUTURE) ×1 IMPLANT
SUT VIC AB 3-0 SH 27 (SUTURE) ×1
SUT VIC AB 3-0 SH 27X BRD (SUTURE) ×1 IMPLANT
SUT VIC AB 4-0 FS2 27 (SUTURE) ×2 IMPLANT

## 2016-09-12 NOTE — Anesthesia Post-op Follow-up Note (Cosign Needed)
Anesthesia QCDR form completed.        

## 2016-09-12 NOTE — Transfer of Care (Signed)
Immediate Anesthesia Transfer of Care Note  Patient: Claire Strong  Procedure(s) Performed: Procedure(s): FUSION OF TALONAVICULAR JOINT WITH TRINITY BONE GRAFT (Left)  Patient Location: PACU  Anesthesia Type:General  Level of Consciousness: sedated  Airway & Oxygen Therapy: Patient Spontanous Breathing and Patient connected to face mask oxygen  Post-op Assessment: Report given to RN and Post -op Vital signs reviewed and stable  Post vital signs: Reviewed and stable  Last Vitals:  Vitals:   09/12/16 0722 09/12/16 1128  BP: 118/87 107/66  Pulse: (!) 50 (!) 59  Resp: 17 19  Temp:  36.3 C    Last Pain:  Vitals:   09/12/16 1128  TempSrc:   PainSc: Asleep         Complications: No apparent anesthesia complications

## 2016-09-12 NOTE — Discharge Instructions (Signed)
[facility]  POST OPERATIVE INSTRUCTIONS FOR DR. TROXLER AND DR[facility] POD[facility]ATRY DEPART[facility]   1. Take your medication as prescribed.  Pain medication should be taken only as needed.  2. Keep the dressing clean, dry and intact.  3. Keep your foot elevated above the heart level for the first 48 hours.    AMBULATORY SURGERY  DISCHARGE INSTRUCTIONS   1) The drugs that you were given will stay in your system until tomorrow so for the next 24 hours you should not:  A) Drive an automobile B) Make any legal decisions C) Drink any alcoholic beverage   2) You may resume regular meals tomorrow.  Today it is better to start with liquids and gradually work up to solid foods.  You may eat anything you prefer, but it is better to start with liquids, then soup and crackers, and gradually work up to solid foods.   3) Please notify your doctor immediately if you have any unusual bleeding, trouble breathing, redness and pain at the surgery site, drainage, fever, or pain not relieved by medication.    4) Additional Instructions:        Please contact your physician with any problems or Same Day Surgery at [phone], Monday through Friday 6 am to 4 pm, or [place] at [facility] number at [phone].  4. Walking to the bathroom and brief periods of walking are acceptable, unless we have instructed you to be non-weight bearing.  5. Always wear your post-op shoe when walking.  Always use your crutches if you are to be non-weight bearing.  6. Do not take a shower. Baths are permissible as long as the foot is kept out of the water.   7. Every hour you are awake:  - Bend your knee 15 times. - Massage calf 15 times  8. Call [facility] [phone]) if any of the following problems occur: - You develop a temperature or fever. - The bandage becomes saturated with blood. - Medication does not stop your  pain. - Injury of the foot occurs. - Any symptoms of infection including redness, odor, or red streaks running from wound.

## 2016-09-12 NOTE — Anesthesia Postprocedure Evaluation (Signed)
Anesthesia Post Note  Patient: Claire Strong  Procedure(s) Performed: Procedure(s) (LRB): FUSION OF TALONAVICULAR JOINT WITH TRINITY BONE GRAFT (Left)  Patient location during evaluation: PACU Anesthesia Type: General Level of consciousness: awake and alert Pain management: pain level controlled Vital Signs Assessment: post-procedure vital signs reviewed and stable Respiratory status: spontaneous breathing, nonlabored ventilation, respiratory function stable and patient connected to nasal cannula oxygen Cardiovascular status: blood pressure returned to baseline and stable Postop Assessment: no signs of nausea or vomiting Anesthetic complications: no     Last Vitals:  Vitals:   09/12/16 1215 09/12/16 1230  BP: 139/89 (!) 128/95  Pulse: (!) 57 (!) 59  Resp: 16 16  Temp: 36.5 C     Last Pain:  Vitals:   09/12/16 1158  TempSrc:   PainSc: 0-No pain                 Katalia Choma S

## 2016-09-12 NOTE — Anesthesia Procedure Notes (Signed)
Anesthesia Regional Block: Popliteal block   Pre-Anesthetic Checklist: ,, timeout performed, Correct Patient, Correct Site, Correct Laterality, Correct Procedure, Correct Position, site marked, Risks and benefits discussed,  Surgical consent,  Pre-op evaluation,  At surgeon's request and post-op pain management   Prep: Betadine       Needles:  Injection technique: Single-shot  Needle Type: Echogenic Stimulator Needle     Needle Length: 5cm  Needle Gauge: 21     Additional Needles:   Procedures: ultrasound guided, nerve stimulator,,,,,,   Nerve Stimulator or Paresthesia:  Response: biceps flexion, 0.8 mA,   Additional Responses:   Narrative:  Injection made incrementally with aspirations every 5 mL.  Performed by: Personally  Anesthesiologist: Berdine Addison  Additional Notes: Functioning IV was confirmed and monitors were applied. Pt was placed supine.  A 41mm 22ga Arrow echogenic stimulator needle was used. Sterile prep and drape,hand hygiene and sterile gloves were used.  Negative aspiration and negative test dose prior to incremental administration of local anesthetic. The patient tolerated the procedure well.  Ultrasound guidance: relevent anatomy identified, needle position confirmed, local anesthetic spread visualized around nerve(s), vascular puncture avoided.  Image printed for medical record.  Total of of ropivicaine.

## 2016-09-12 NOTE — Anesthesia Preprocedure Evaluation (Signed)
Anesthesia Evaluation  Patient identified by MRN, date of birth, ID band Patient awake    Reviewed: Allergy & Precautions, NPO status , Patient's Chart, lab work & pertinent test results, reviewed documented beta blocker date and time   History of Anesthesia Complications (+) PONV and history of anesthetic complications  Airway Mallampati: III  TM Distance: >3 FB     Dental  (+) Chipped   Pulmonary asthma , former smoker,           Cardiovascular hypertension, Pt. on medications + Peripheral Vascular Disease       Neuro/Psych  Headaches, PSYCHIATRIC DISORDERS Anxiety Depression    GI/Hepatic   Endo/Other  Hypothyroidism   Renal/GU Renal disease     Musculoskeletal  (+) Arthritis , Fibromyalgia -  Abdominal   Peds  Hematology  (+) anemia ,   Anesthesia Other Findings Hx of DVTs.  Reproductive/Obstetrics                             Anesthesia Physical Anesthesia Plan  ASA: III  Anesthesia Plan: General   Post-op Pain Management:    Induction: Intravenous  Airway Management Planned: LMA  Additional Equipment:   Intra-op Plan:   Post-operative Plan:   Informed Consent: I have reviewed the patients History and Physical, chart, labs and discussed the procedure including the risks, benefits and alternatives for the proposed anesthesia with the patient or authorized representative who has indicated his/her understanding and acceptance.     Plan Discussed with: CRNA  Anesthesia Plan Comments:         Anesthesia Quick Evaluation

## 2016-09-12 NOTE — H&P (Signed)
HISTORY AND PHYSICAL INTERVAL NOTE:  09/12/2016  7:21 AM  Claire Strong  has presented today for surgery, with the diagnosis of Foot Pain-Left Primary Osteoarthritis - Left foot.  The various methods of treatment have been discussed with the patient.  No guarantees were given.  After consideration of risks, benefits and other options for treatment, the patient has consented to surgery.  I have reviewed the patients' chart and labs.    Patient Vitals for the past 24 hrs:  BP Temp Temp src Pulse Resp SpO2 Height Weight  09/12/16 0657 - 98 F (36.7 C) - - - - - -  09/12/16 0630 - - - - - - 5\' 9"  (1.753 m) 103.9 kg (229 lb)  09/12/16 0616 (!) 142/86 98.1 F (36.7 C) Oral (!) 51 18 100 % - -    A history and physical examination was performed in my office.  The patient was reexamined.  There have been no changes to this history and physical examination.  09/14/16 A

## 2016-09-12 NOTE — Anesthesia Procedure Notes (Addendum)
Procedure Name: LMA Insertion Date/Time: 09/12/2016 7:26 AM Performed by: Henrietta Hoover Pre-anesthesia Checklist: Patient identified, Emergency Drugs available, Suction available, Patient being monitored and Timeout performed Patient Re-evaluated:Patient Re-evaluated prior to inductionOxygen Delivery Method: Circle system utilized Preoxygenation: Pre-oxygenation with 100% oxygen Intubation Type: IV induction Ventilation: Mask ventilation without difficulty LMA: LMA inserted LMA Size: 4.0 Number of attempts: 1 Placement Confirmation: ETT inserted through vocal cords under direct vision,  positive ETCO2 and breath sounds checked- equal and bilateral Tube secured with: Tape Dental Injury: Teeth and Oropharynx as per pre-operative assessment

## 2016-09-12 NOTE — Op Note (Signed)
Operative note   Surgeon:Dannie Woolen    Assistant:none     Preop diagnosis:1.  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 Arthrodesis left talonavicular joint 2. Arthrodesis left calcaneocuboid joint    EBL: 400 mL    Anesthesia:regional and general    Hemostasis: None    Specimen: None    Complications: None    Operative indications:Claire Strong is an 56 y.o. that presents today for surgical intervention.  The risks/benefits/alternatives/complications have been discussed and consent has been given.    Procedure:  Patient was brought into the OR and placed on the operating table in thesupine position. After anesthesia was obtained theleft lower extremity was prepped and draped in usual sterile fashion.  Attention was directed to the dorsal aspect of the left talonavicular joint where longitudinal incision was made just lateral to the anterior tibial tendon. Sharp and blunt lesser secured down to the periosteum. Subperiosteal dissection was undertaken medial and lateral. This expose the talonavicular joint. At this time all articular cartilage was removed with a combination of curettes and rongeur and bur. This was taken down to the subchondral bone plate and bleeding bone as well. An directed laterally to the calcaneocuboid joint where a lateral incision was performed. Sharp and blunt dissection carried down to the periosteum. Subperiosteal dissection was undertaken exposing the calcaneocuboid joint. With a combination of curettes osteotomes and bur I was able to remove all the articular cartilage in this area as well. The joints were then prepped for the arthrodesis with a 20 punch drill bit. The areas were both packed with Trinity bone graft. This time the dorsal talonavicular joint was fused. 220 mm compression staples and 216 mm compression staples were used circumferentially around the talonavicular  joint. Excellent compression was noted. The calcaneocuboid joint was infused with a total of 3 staples. A 20 mm, a 16 mm, and a 13 mm staple were all used. Excellent stability was noted in all planes. All wounds were flushed with copious amounts of irrigation. Layered closure performed with a 20 and 3-0 Vicryl. Skin staples for the skin. 0.25% Marcaine with epinephrine wasn't created along the incision sites. A total of 15 cc was used.   Patient tolerated the procedure and anesthesia well.  Was transported from the OR to the PACU with all vital signs stable and vascular status intact. To be discharged per routine protocol.  Will follow up in approximately 1 week in the outpatient clinic.

## 2016-09-14 ENCOUNTER — Encounter: Payer: Self-pay | Admitting: Hematology and Oncology

## 2016-09-15 ENCOUNTER — Telehealth: Payer: Self-pay | Admitting: *Deleted

## 2016-09-15 ENCOUNTER — Encounter: Payer: Self-pay | Admitting: Podiatry

## 2016-09-15 NOTE — Telephone Encounter (Signed)
LMOM for patient to call office.

## 2016-09-15 NOTE — Telephone Encounter (Signed)
Patient called back and I was busy, Maureen Ralphs let me know that she had called and she read her the message, she  just didn't take over encounter and close it out.

## 2016-09-15 NOTE — Telephone Encounter (Signed)
-----   Message from Harle Battiest, PA-C sent at 09/12/2016  8:55 AM EDT ----- Please let Mrs. Budney know that she does not have any kidney stones at this time.

## 2016-09-17 DIAGNOSIS — M79672 Pain in left foot: Secondary | ICD-10-CM | POA: Diagnosis not present

## 2016-09-23 ENCOUNTER — Encounter: Payer: Self-pay | Admitting: Vascular Surgery

## 2016-09-26 ENCOUNTER — Other Ambulatory Visit
Admission: RE | Admit: 2016-09-26 | Discharge: 2016-09-26 | Disposition: A | Discharge status: Home / Self Care | Payer: BLUE CROSS/BLUE SHIELD | Source: Ambulatory Visit | Attending: Otolaryngology | Admitting: Otolaryngology

## 2016-09-26 DIAGNOSIS — E039 Hypothyroidism, unspecified: Secondary | ICD-10-CM | POA: Insufficient documentation

## 2016-09-26 LAB — T4, FREE: FREE T4: 1.14 ng/dL — AB (ref 0.61–1.12)

## 2016-09-26 LAB — TSH: TSH: 0.427 u[IU]/mL (ref 0.350–4.500)

## 2016-09-27 LAB — T3, FREE: T3 FREE: 2.9 pg/mL (ref 2.0–4.4)

## 2016-10-03 DIAGNOSIS — J3489 Other specified disorders of nose and nasal sinuses: Secondary | ICD-10-CM | POA: Diagnosis not present

## 2016-10-03 DIAGNOSIS — E039 Hypothyroidism, unspecified: Secondary | ICD-10-CM | POA: Diagnosis not present

## 2016-10-07 ENCOUNTER — Ambulatory Visit: Payer: BLUE CROSS/BLUE SHIELD | Admitting: Urology

## 2016-10-13 ENCOUNTER — Ambulatory Visit (INDEPENDENT_AMBULATORY_CARE_PROVIDER_SITE_OTHER): Payer: BLUE CROSS/BLUE SHIELD | Admitting: Vascular Surgery

## 2016-10-13 ENCOUNTER — Encounter (INDEPENDENT_AMBULATORY_CARE_PROVIDER_SITE_OTHER): Payer: Self-pay

## 2016-10-13 ENCOUNTER — Encounter (INDEPENDENT_AMBULATORY_CARE_PROVIDER_SITE_OTHER): Payer: Self-pay | Admitting: Vascular Surgery

## 2016-10-13 VITALS — BP 116/73 | HR 55 | Resp 16 | Wt 230.0 lb

## 2016-10-13 DIAGNOSIS — Z86718 Personal history of other venous thrombosis and embolism: Secondary | ICD-10-CM | POA: Diagnosis not present

## 2016-10-13 DIAGNOSIS — M79605 Pain in left leg: Secondary | ICD-10-CM | POA: Diagnosis not present

## 2016-10-13 DIAGNOSIS — D6862 Lupus anticoagulant syndrome: Secondary | ICD-10-CM | POA: Diagnosis not present

## 2016-10-13 DIAGNOSIS — M79609 Pain in unspecified limb: Secondary | ICD-10-CM | POA: Insufficient documentation

## 2016-10-13 DIAGNOSIS — M19072 Primary osteoarthritis, left ankle and foot: Secondary | ICD-10-CM

## 2016-10-13 NOTE — Progress Notes (Signed)
MRN : [id number]  Claire Strong is a 56 y.o. ([birth year]) female who presents with chief complaint of  Chief Complaint  Patient presents with  . Follow-up  .  History of Present Illness: The patient presents to the office s/p fusion of the left ankle on 09/12/2016.  There were no complications with the surgery.  There is a history of DVT which was identified years ago and was treated with anticoagulation.  The presenting symptoms were pain and swelling in the lower extremity.  She has had two prior filters.  Currently she has filter placed preoperatively.  The patient notes the leg continues to be mildly painful with dependency and still swells quite a bite.  Symptoms are much better with elevation.  The patient notes minimal edema in the morning which steadily worsens throughout the day.    The patient has not been using compression therapy at this point.  No SOB or pleuritic chest pains.  No cough or hemoptysis.  No blood per rectum or blood in any sputum.  No excessive bruising per the patient.   Current Meds  Medication Sig  . ACETAMINOPHEN-BUTALBITAL 50-325 MG TABS Take 1 tablet by mouth at bedtime. Reported on 09/03/2015 3 times a week  . albuterol (PROVENTIL HFA;VENTOLIN HFA) 108 (90 BASE) MCG/ACT inhaler Inhale 2 puffs into the lungs every 6 (six) hours as needed.  Marland Kitchen apixaban (ELIQUIS) 2.5 MG TABS tablet Take 1 tablet (2.5 mg total) by mouth 2 (two) times daily.  Marland Kitchen aspirin 81 MG tablet Take 81 mg by mouth daily.  . benazepril (LOTENSIN) 10 MG tablet TAKE ONE TABLET BY MOUTH ONCE DAILY (Patient taking differently: TAKE ONE TABLET BY MOUTH ONCE DAILY AT BEDTIME)  . carisoprodol (SOMA) 350 MG tablet TAKE ONE TABLET BY MOUTH AT BEDTIME  . cetirizine (ZYRTEC) 10 MG tablet Take 10 mg by mouth daily.  . Cholecalciferol (VITAMIN D3) 1000 UNITS tablet Take 2,000 Units by mouth daily.   . citalopram (CELEXA) 40 MG tablet TAKE ONE TABLET BY MOUTH ONCE DAILY  . EPINEPHrine  (EPI-PEN) 0.3 mg/0.3 mL DEVI Inject 0.3 mLs (0.3 mg total) into the muscle once.  . Fluticasone-Salmeterol (ADVAIR DISKUS) 100-50 MCG/DOSE AEPB Inhale 1 puff into the lungs every 12 (twelve) hours.  . furosemide (LASIX) 20 MG tablet TAKE ONE TABLET BY MOUTH ONCE DAILY  . gabapentin (NEURONTIN) 300 MG capsule Take 1 capsule (300 mg total) by mouth 3 (three) times a week. (Patient taking differently: Take 300 mg by mouth at bedtime. )  . levothyroxine (SYNTHROID, LEVOTHROID) 150 MCG tablet Take 150 mcg by mouth daily before breakfast.  . metoprolol tartrate (LOPRESSOR) 25 MG tablet Take 1 tablet (25 mg total) by mouth 2 (two) times daily.  . montelukast (SINGULAIR) 10 MG tablet Take 10 mg by mouth every morning.   . ondansetron (ZOFRAN) 4 MG tablet Take 1 tablet (4 mg total) by mouth every 6 (six) hours as needed for nausea.  . ondansetron (ZOFRAN) 4 MG tablet Take 1 tablet (4 mg total) by mouth every 8 (eight) hours as needed for nausea or vomiting.  . traZODone (DESYREL) 50 MG tablet TAKE ONE-HALF TO ONE TABLET BY MOUTH AT BEDTIME AS NEEDED FOR SLEEP  . Turmeric 500 MG CAPS Take 1 capsule by mouth 2 (two) times daily.  . vitamin B-12 (CYANOCOBALAMIN) 500 MCG tablet Take 500 mcg by mouth daily.    Past Medical History:  Diagnosis Date  . Anxiety   . Bleeding disorder (HCC)   .  Bronchial asthma   . Cancer ([place])    melanoma  . 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 Gigi Gin ortho)  . Chronic venous insufficiency 2007   s/p vein stripping and laser ablation  . Depression   . Diverticulosis    mild by colonoscopy  . Fibromyalgia 02/2014   [place]  . History of DVT (deep vein thrombosis)    DVTs after 1st pregnancy, not on AC 2/2 bleeding ulcer, greenfield filter in place  . History of gastric ulcer 2009  . History of kidney stones   . Horseshoe kidney    sole, R kidney damage  . HTN (hypertension)   . Hypothyroidism   . Kidney stones    s/p renal  hematoma after lithotripsy on right  . Lupus anticoagulant disorder ([place])   . Multinodular goiter    s/p thyroidectomy  . Nasal septal perforation    chronic, ENT rec avoid antihistamine, INS  . Osteoarthritis    ?FM by rheum  . Overweight   . Post-operative nausea and vomiting   . Post-surgical hypothyroidism    for multinodular goiter  . 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 Frutoso Chase  . Seasonal allergies    allergy shots, singulair  . 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 Tachycardia     Past Surgical History:  Procedure Laterality Date  . ABDOMINAL HERNIA REPAIR    . ABDOMINAL HYSTERECTOMY    . ANKLE FUSION Left 05/2013  . APPENDECTOMY  1978  . BACK SURGERY  2009  . CESAREAN SECTION     X 2  . CHOLECYSTECTOMY  1998  . COLONOSCOPY  05/2012   hyperplastic polyps x2, mild diverticulosis Deatra Ina) rpt 10 yrs  . CYST EXCISION     back  and shoulder  . ESI Right 10/2015, 02/2016   C4/5 then C5/6 ESI ([facility]asnis)  . ESI Bilateral 06/2015, 08/2015, 03/2016   L4/5 transforaminal ESI (Chasnis)  . FUSION OF TALONAVICULAR JOINT Left 09/12/2016   Procedure: FUSION OF TALONAVICULAR JOINT WITH TRINITY BONE GRAFT;  Surgeon: Samara Deist, DPM;  Location: [facility];  Service: Podiatry;  Laterality: Left;  Marland Kitchen GASTRIC BYPASS  05/21/05   Dr. Frutoso Chase, Roux-en-Y  . Greenfield filter removal  2015   removed 6 wks after surgery  . hammerhead toes Left   . HERNIA REPAIR     X 3  . IVC FILTER INSERTION N/A 09/09/2016   Procedure: IVC Filter Insertion;  Surgeon: Katha Cabal, MD;  Location: [facility];  Service: Cardiovascular;  Laterality: N/A;  . JOINT REPLACEMENT    . lyphoma excision  08/2015   arm  . NASAL SEPTUM SURGERY  1981   deviated septum, repaired at [facility]  . PARTIAL HYSTERECTOMY  1989   ovaries remain  . SKIN SURGERY     basel cell  . TONSILLECTOMY  1969  . TOTAL HIP ARTHROPLASTY Right 11/06/2015   Procedure: TOTAL HIP ARTHROPLASTY ANTERIOR APPROACH;  Surgeon: Hessie Knows, MD;  Location: [facility];  Service: Orthopedics;  Laterality: Right;  . TOTAL KNEE ARTHROPLASTY Left 08/2013  . TOTAL THYROIDECTOMY  09/2011   benign path (done for multinodular goiter concern for cancer)    Social History Social History  Substance Use Topics  . Smoking status: Former Smoker    Types: Cigarettes    Quit date: 06/16/2004  . Smokeless tobacco: Never Used     Comment: Socially-no longer  . Alcohol use  0.0 oz/week     Comment: Occasionally-wine    Family History Family History  Problem Relation Age of Onset  . Diabetes Mother     prediabetes  . Alzheimer's disease Mother   . Hyperlipidemia Mother   . Diabetes Father   . Coronary artery disease Father 84    CABG  . Alcohol abuse Father   . Cancer Father     lung  . Cancer Maternal Uncle     brain  . Diabetes Paternal Grandmother   . Stroke Neg Hx   . Colon cancer Neg Hx   . Stomach cancer Neg Hx   . Kidney disease Neg Hx   . Breast cancer Neg Hx   . Kidney cancer Neg Hx   . Bladder Cancer Neg Hx     Allergies  Allergen Reactions  . Sulfa Antibiotics Shortness Of Breath  . Cefuroxime Axetil Other (See Comments)    Bleeding ulcer  . Nutritional Supplements Swelling    Unsure which  . Oxycodone-Acetaminophen Nausea And Vomiting  . Betadine [Povidone Iodine] Rash     REVIEW OF SYSTEMS (Negative unless checked)  Constitutional: [] Weight loss  [] Fever  [] Chills Cardiac: [] Chest pain   [] Chest pressure   [] Palpitations   [] Shortness of breath when laying flat   [] Shortness of breath with exertion. Vascular:  [] Pain in legs with walking   [] Pain in legs at rest  [x] History of DVT   [] Phlebitis   [x] Swelling in legs   [] Varicose veins   [] Non-healing ulcers Pulmonary:   [] Uses home oxygen   [] Productive cough   [] Hemoptysis   [] Wheeze  [] COPD   [] Asthma Neurologic:  [] Dizziness   [] Seizures   [] History of stroke   [] History of TIA  [] Aphasia   [] Vissual changes   [] Weakness or numbness in arm    [] Weakness or numbness in leg Musculoskeletal:   [x] Joint swelling   [x] Joint pain   [] Low back pain Hematologic:  [] Easy bruising  [] Easy bleeding   [] Hypercoagulable state   [] Anemic Gastrointestinal:  [] Diarrhea   [] Vomiting  [] Gastroesophageal reflux/heartburn   [] Difficulty swallowing. Genitourinary:  [] Chronic kidney disease   [] Difficult urination  [] Frequent urination   [] Blood in urine Skin:  [] Rashes   [] Ulcers  Psychological:  [] History of anxiety   []  History of major depression.  Physical Examination  Vitals:   10/13/16 1111  BP: 116/73  Pulse: (!) 55  Resp: 16  Weight: 230 lb (104.3 kg)   Body mass index is 33.97 kg/m. Gen: WD/WN, NAD Head: [place]/AT, No temporalis wasting.  Ear/Nose/Throat: Hearing grossly intact, nares w/o erythema or drainage Eyes: PER, EOMI, sclera nonicteric.  Neck: Supple, no large masses.   Pulmonary:  Good air movement, no audible wheezing bilaterally, no use of accessory muscles.  Cardiac: RRR, no JVD Vascular:  Vessel Right Left  Radial Palpable Palpable  Ulnar Palpable Palpable  Gastrointestinal: Non-distended. No guarding/no peritoneal signs.  Musculoskeletal: M/S 5/5 throughout.  Wearing a boot.  Neurologic: CN 2-12 intact. Symmetrical.  Speech is fluent. Motor exam as listed above. Psychiatric: Judgment intact, Mood & affect appropriate for pt's clinical situation. Dermatologic: No rashes or ulcers noted.  No changes consistent with cellulitis. Lymph : No lichenification or skin changes of chronic lymphedema.  CBC Lab Results  Component Value Date   WBC 5.2 09/03/2016   HGB 13.3 09/03/2016   HCT 39.4 09/03/2016   MCV 87.7 09/03/2016   PLT 215 09/03/2016    BMET    Component Value Date/Time  NA 135 09/03/2016 1505   NA 132 (L) 08/27/2013 0616   K 3.6 09/03/2016 1505   K 4.5 08/27/2013 0616   CL 102 09/03/2016 1505   CL 102 08/27/2013 0616   CO2 26 09/03/2016 1505   CO2 27 08/27/2013 0616   GLUCOSE 98 09/03/2016 1505    GLUCOSE 107 (H) 08/27/2013 0616   BUN 13 09/03/2016 1505   BUN 12 08/27/2013 0616   CREATININE 0.95 09/03/2016 1505   CREATININE 0.91 08/27/2013 0616   CREATININE 1.02 09/20/2011   CALCIUM 8.6 (L) 09/03/2016 1505   CALCIUM 7.4 (L) 08/27/2013 0616   CALCIUM 7.7 09/20/2011   GFRNONAA >60 09/03/2016 1505   GFRNONAA >60 08/27/2013 0616   GFRAA >60 09/03/2016 1505   GFRAA >60 08/27/2013 0616   CrCl cannot be calculated (Patient's most recent lab result is older than the maximum 21 days allowed.).  COAG Lab Results  Component Value Date   INR 1.00 10/31/2015   INR 1.0 06/28/2014   INR 0.9 08/10/2013    Radiology No results found.   Assessment/Plan 1. History of DVT (deep vein thrombosis) The patient will continue anticoagulation for now as there have not been any problems or complications at this point.  IVC filter has done it's job and should be removed in about 6 weeks.  IVC filter placement will be removed. Risk and benefits were reviewed the patient.  Indications for the procedure were reviewed.  All questions were answered, the patient agrees to proceed.   I have had a long discussion with the patient regarding DVT and post phlebitic changes such as swelling and why it  causes symptoms such as pain.  The patient will wear graduated compression stockings class 1 (20-30 mmHg) on a daily basis a prescription was given. The patient will  beginning wearing the stockings first thing in the morning and removing them in the evening. The patient is instructed specifically not to sleep in the stockings.  In addition, behavioral modification including elevation during the day and avoidance of prolonged dependency will be initiated.    The patient will follow-up with me in 3 months after the joint replacement surgery to discuss removal (this was also discussed today and the patient agrees with the plan to have the filter removed).    2. Lupus anticoagulant disorder (HCC) Continue  anticoagulation life long  3. Osteoarthritis of left ankle, unspecified osteoarthritis type Plan per Dr Ether Griffins  4. Pain of left lower extremity Continue NSAID medications as ordered    Levora Dredge, MD  10/13/2016 12:51 PM

## 2016-10-23 DIAGNOSIS — M19072 Primary osteoarthritis, left ankle and foot: Secondary | ICD-10-CM | POA: Diagnosis not present

## 2016-10-26 ENCOUNTER — Other Ambulatory Visit (INDEPENDENT_AMBULATORY_CARE_PROVIDER_SITE_OTHER): Payer: Self-pay | Admitting: Vascular Surgery

## 2016-10-28 ENCOUNTER — Encounter: Payer: Self-pay | Admitting: *Deleted

## 2016-10-28 DIAGNOSIS — Z87891 Personal history of nicotine dependence: Secondary | ICD-10-CM | POA: Insufficient documentation

## 2016-10-28 DIAGNOSIS — Z7982 Long term (current) use of aspirin: Secondary | ICD-10-CM | POA: Diagnosis not present

## 2016-10-28 DIAGNOSIS — R6884 Jaw pain: Secondary | ICD-10-CM | POA: Insufficient documentation

## 2016-10-28 DIAGNOSIS — J45909 Unspecified asthma, uncomplicated: Secondary | ICD-10-CM | POA: Diagnosis not present

## 2016-10-28 DIAGNOSIS — Z5321 Procedure and treatment not carried out due to patient leaving prior to being seen by health care provider: Secondary | ICD-10-CM | POA: Insufficient documentation

## 2016-10-28 DIAGNOSIS — E039 Hypothyroidism, unspecified: Secondary | ICD-10-CM | POA: Diagnosis not present

## 2016-10-28 DIAGNOSIS — I10 Essential (primary) hypertension: Secondary | ICD-10-CM | POA: Diagnosis not present

## 2016-10-28 DIAGNOSIS — Z79899 Other long term (current) drug therapy: Secondary | ICD-10-CM | POA: Diagnosis not present

## 2016-10-28 NOTE — ED Triage Notes (Signed)
Pt was a history of TMJ, pt reports jaw has been locking up at times and causing pain, pt denies any other symptoms

## 2016-10-29 ENCOUNTER — Emergency Department
Admission: EM | Admit: 2016-10-29 | Discharge: 2016-10-29 | Disposition: A | Discharge status: Home / Self Care | Payer: BLUE CROSS/BLUE SHIELD | Attending: Emergency Medicine | Admitting: Emergency Medicine

## 2016-10-29 ENCOUNTER — Encounter: Payer: Self-pay | Admitting: *Deleted

## 2016-10-29 ENCOUNTER — Ambulatory Visit
Admission: EM | Admit: 2016-10-29 | Discharge: 2016-10-29 | Disposition: A | Discharge status: Home / Self Care | Payer: BLUE CROSS/BLUE SHIELD | Attending: Family Medicine | Admitting: Family Medicine

## 2016-10-29 DIAGNOSIS — M26621 Arthralgia of right temporomandibular joint: Secondary | ICD-10-CM | POA: Diagnosis not present

## 2016-10-29 DIAGNOSIS — D239 Other benign neoplasm of skin, unspecified: Secondary | ICD-10-CM | POA: Diagnosis not present

## 2016-10-29 DIAGNOSIS — Z86018 Personal history of other benign neoplasm: Secondary | ICD-10-CM | POA: Diagnosis not present

## 2016-10-29 DIAGNOSIS — D485 Neoplasm of uncertain behavior of skin: Secondary | ICD-10-CM | POA: Diagnosis not present

## 2016-10-29 DIAGNOSIS — D0322 Melanoma in situ of left ear and external auricular canal: Secondary | ICD-10-CM | POA: Diagnosis not present

## 2016-10-29 DIAGNOSIS — Z85828 Personal history of other malignant neoplasm of skin: Secondary | ICD-10-CM | POA: Diagnosis not present

## 2016-10-29 DIAGNOSIS — L570 Actinic keratosis: Secondary | ICD-10-CM | POA: Diagnosis not present

## 2016-10-29 MED ORDER — CYCLOBENZAPRINE HCL 10 MG PO TABS: 10.0000 mg | ORAL_TABLET | Freq: Every day | ORAL | 0 refills | Status: AC

## 2016-10-29 NOTE — Discharge Instructions (Signed)
Take medication as prescribed. Rest. Drink plenty of fluids. Soft foods.   Follow up with dental or Ear Nose and Throat as discussed.   Follow up with your primary care physician this week as needed. Return to Urgent care for new or worsening concerns.

## 2016-10-29 NOTE — ED Triage Notes (Signed)
Right side jaw pain and locking x1-2 weeks. Also, right ear pain.

## 2016-10-29 NOTE — ED Provider Notes (Signed)
[facility] ____________________________________________  Time seen: Approximately 10:50 AM  I have reviewed the triage vital signs and the nursing notes.   HISTORY  Chief Complaint Jaw Pain and Otalgia   HPI RELDA Strong is a 56 y.o. female presenting for evaluation of right anterior ear pain has been present for the last 1-2 weeks. Patient reports having mild constant pain but pain intermittently increases. Reports pain is worse at night. Denies any pain radiation. Reports eating and opening her mouth causes pain to increase. Patient states there is associated and locking, grinding and clicking sensation to her right jaw. Patient reports that she know she grinds her teeth at night as well as reports that she has been told she has TMJ issues from several years ago. Reports otherwise feels well. Denies alleviating factors. Denies other complaints. Denies pain radiation, paresthesias, changes of chronic neck pain, facial swelling, facial pain, sinus pain, sore throat, dental pain, vision changes, fevers, chest pain or shortness of breath or abdominal pain. Denies recent antibiotic use.   Patient denies changes in chronic pain. Reports has chronic neck, back, bilateral leg pain. Patient reports that she has a history of gastric bypass and is currently Eliquis anticoagulant and is unable to take NSAIDs or prednisone.  Ria Bush, MD: PCP   Past Medical History:  Diagnosis Date  . Anxiety   . Bleeding disorder ([place])   . Bronchial asthma   . Cancer ([place])    melanoma  . 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 Gigi Gin ortho)  . Chronic venous insufficiency 2007   s/p vein stripping and laser ablation  . Depression   . Diverticulosis    mild by colonoscopy  . Fibromyalgia 02/2014   [place]  . History of DVT (deep vein thrombosis)    DVTs after 1st pregnancy, not on AC 2/2 bleeding ulcer, greenfield filter in place  . History of  gastric ulcer 2009  . History of kidney stones   . Horseshoe kidney    sole, R kidney damage  . HTN (hypertension)   . Hypothyroidism   . Kidney stones    s/p renal hematoma after lithotripsy on right  . Lupus anticoagulant disorder ([employer])   . Multinodular goiter    s/p thyroidectomy  . Nasal septal perforation    chronic, ENT rec avoid antihistamine, INS  . Osteoarthritis    ?FM by rheum  . Overweight   . Post-operative nausea and vomiting   . Post-surgical hypothyroidism    for multinodular goiter  . 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 Frutoso Chase  . Seasonal allergies    allergy shots, singulair  . 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 Tachycardia     Patient Active Problem List   Diagnosis Date Noted  . Pain in limb 10/13/2016  . Pre-op exam 08/28/2016  . History of DVT (deep vein thrombosis) 08/25/2016  . PAD (peripheral artery disease) ([place]) 08/25/2016  . Chronic venous insufficiency 08/25/2016  . Iron deficiency anemia 03/25/2016  . Primary osteoarthritis of right hip 11/06/2015  . History of kidney stones 09/03/2015  . Osteoarthritis   . Fibromyalgia 02/14/2014  . Atrial tachycardia ([place]) 01/25/2013  . Hypothyroidism, postsurgical 02/25/2012  . Edema 01/28/2012  . Headache(784.0) 09/25/2011  . Bronchial asthma   . Healthcare maintenance 03/18/2011  . Hot flashes 03/18/2011  . Nasal septal perforation   . Horseshoe kidney   . HTN (hypertension)   . Depression   .  Lupus anticoagulant disorder ([facility])   . 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 ABDOMINAL HERNIA REPAIR    . ABDOMINAL HYSTERECTOMY    . ANKLE FUSION Left 05/2013  . APPENDECTOMY  1978  . BACK SURGERY  2009  . CESAREAN SECTION     X 2  . CHOLECYSTECTOMY  1998  . COLONOSCOPY  05/2012   hyperplastic polyps x2, mild diverticulosis Arlyce Dice) rpt 10 yrs  . CYST EXCISION     back  and shoulder  . ESI Right 10/2015, 02/2016   C4/5 then C5/6 ESI  ([place])  . ESI Bilateral 06/2015, 08/2015, 03/2016   L4/5 transforaminal ESI ([place])  . FUSION OF TALONAVICULAR JOINT Left 09/12/2016   Procedure: FUSION OF TALONAVICULAR JOINT WITH TRINITY BONE GRAFT;  Surgeon: Gwyneth Revels, DPM;  Location: [facility] ORS;  Service: Podiatry;  Laterality: Left;  Marland Kitchen GASTRIC BYPASS  05/21/05   Dr. Jacqulyn Ducking, [place]oux-en-Y  . Greenfield filter removal  2015   removed 6 wks after surgery  . hammerhead toes Left   . HERNIA REPAIR     X 3  . IVC FILTER INSERTION N/A 09/09/2016   Procedure: IVC Filter Insertion;  Surgeon: Renford Dills, MD;  Location: [facility];  Service: Cardiovascular;  Laterality: N/A;  . JOINT REPLACEMENT    . lyphoma excision  08/2015   arm  . NASAL SEPTUM SURGERY  1981   deviated septum, repaired at [facility]  . PARTIAL HYSTERECTOMY  1989   ovaries remain  . SKIN SURGERY     basel cell  . TONSILLECTOMY  1969  . TOTAL HIP ARTHROPLASTY Right 11/06/2015   Procedure: TOTAL HIP ARTHROPLASTY ANTERIOR APPROACH;  Surgeon: Kennedy Bucker, MD;  Location: [facility] ORS;  Service: Orthopedics;  Laterality: Right;  . TOTAL KNEE ARTHROPLASTY Left 08/2013  . TOTAL THYROIDECTOMY  09/2011   benign path (done for multinodular goiter concern for cancer)     No current facility-administered medications for this encounter.   Current Outpatient Prescriptions:  .  apixaban (ELIQUIS) 2.5 MG TABS tablet, Take 1 tablet (2.5 mg total) by mouth 2 (two) times daily., Disp: 60 tablet, Rfl: 3 .  benazepril (LOTENSIN) 10 MG tablet, TAKE ONE TABLET BY MOUTH ONCE DAILY (Patient taking differently: TAKE ONE TABLET BY MOUTH ONCE DAILY AT BEDTIME), Disp: 90 tablet, Rfl: 3 .  carisoprodol (SOMA) 350 MG tablet, TAKE ONE TABLET BY MOUTH AT BEDTIME, Disp: 90 tablet, Rfl: 1 .  cetirizine (ZYRTEC) 10 MG tablet, Take 10 mg by mouth daily., Disp: , Rfl:  .  Cholecalciferol (VITAMIN D3) 1000 UNITS tablet, Take 2,000 Units by mouth daily. , Disp: , Rfl:  .  citalopram (CELEXA) 40 MG  tablet, TAKE ONE TABLET BY MOUTH ONCE DAILY, Disp: 90 tablet, Rfl: 3 .  furosemide (LASIX) 20 MG tablet, TAKE ONE TABLET BY MOUTH ONCE DAILY, Disp: 90 tablet, Rfl: 2 .  gabapentin (NEURONTIN) 300 MG capsule, Take 1 capsule (300 mg total) by mouth 3 (three) times a week. (Patient taking differently: Take 300 mg by mouth at bedtime. ), Disp: 12 capsule, Rfl: 11 .  levothyroxine (SYNTHROID, LEVOTHROID) 150 MCG tablet, Take 150 mcg by mouth daily before breakfast., Disp: , Rfl:  .  metoprolol tartrate (LOPRESSOR) 25 MG tablet, Take 1 tablet (25 mg total) by mouth 2 (two) times daily., Disp: 180 tablet, Rfl: 1 .  montelukast (SINGULAIR) 10 MG tablet, Take 10 mg by mouth every morning. , Disp: ,  Rfl:  .  traZODone (DESYREL) 50 MG tablet, TAKE ONE-HALF TO ONE TABLET BY MOUTH AT BEDTIME AS NEEDED FOR SLEEP, Disp: 90 tablet, Rfl: 1 .  Turmeric 500 MG CAPS, Take 1 capsule by mouth 2 (two) times daily., Disp: , Rfl:  .  vitamin B-12 (CYANOCOBALAMIN) 500 MCG tablet, Take 500 mcg by mouth daily., Disp: , Rfl:  .  ACETAMINOPHEN-BUTALBITAL 50-325 MG TABS, Take 1 tablet by mouth at bedtime. Reported on 09/03/2015 3 times a week, Disp: , Rfl:  .  albuterol (PROVENTIL HFA;VENTOLIN HFA) 108 (90 BASE) MCG/ACT inhaler, Inhale 2 puffs into the lungs every 6 (six) hours as needed., Disp: 1 Inhaler, Rfl: 11 .  aspirin 81 MG tablet, Take 81 mg by mouth daily., Disp: , Rfl:  .  cyclobenzaprine (FLEXERIL) 10 MG tablet, Take 1 tablet (10 mg total) by mouth at bedtime. Do not drive as can cause drowsiness, Disp: 10 tablet, Rfl: 0 .  EPINEPHrine (EPI-PEN) 0.3 mg/0.3 mL DEVI, Inject 0.3 mLs (0.3 mg total) into the muscle once., Disp: 1 Device, Rfl: 2 .  Fluticasone-Salmeterol (ADVAIR DISKUS) 100-50 MCG/DOSE AEPB, Inhale 1 puff into the lungs every 12 (twelve) hours., Disp: 60 each, Rfl: 3 .  HYDROmorphone (DILAUDID) 2 MG tablet, Take 1 tablet (2 mg total) by mouth every 4 (four) hours as needed for severe pain. (Patient not taking:  Reported on 10/13/2016), Disp: 30 tablet, Rfl: 0 .  ondansetron (ZOFRAN) 4 MG tablet, Take 1 tablet (4 mg total) by mouth every 6 (six) hours as needed for nausea., Disp: 40 tablet, Rfl: 0 .  ondansetron (ZOFRAN) 4 MG tablet, Take 1 tablet (4 mg total) by mouth every 8 (eight) hours as needed for nausea or vomiting., Disp: 20 tablet, Rfl: 0 .  traMADol (ULTRAM) 50 MG tablet, Take 1 tablet (50 mg total) by mouth every 6 (six) hours as needed for moderate pain. (Patient not taking: Reported on 10/13/2016), Disp: 40 tablet, Rfl: 0  Allergies Sulfa antibiotics; Cefuroxime axetil; Nutritional supplements; Oxycodone-acetaminophen; and Betadine [povidone iodine]  Family History  Problem Relation Age of Onset  . Diabetes Mother        prediabetes  . Alzheimer's disease Mother   . Hyperlipidemia Mother   . Diabetes Father   . Coronary artery disease Father 68       CABG  . Alcohol abuse Father   . Cancer Father        lung  . Cancer Maternal Uncle        brain  . Diabetes Paternal Grandmother   . Stroke Neg Hx   . Colon cancer Neg Hx   . Stomach cancer Neg Hx   . Kidney disease Neg Hx   . Breast cancer Neg Hx   . Kidney cancer Neg Hx   . Bladder Cancer Neg Hx     Social History Social History  Substance Use Topics  . Smoking status: Former Smoker    Types: Cigarettes    Quit date: 06/16/2004  . Smokeless tobacco: Never Used     Comment: Socially-no longer  . Alcohol use 0.0 oz/week     Comment: Occasionally-wine    Review of Systems Constitutional: No fever/chills Eyes: No visual changes. ENT: No sore throat. Cardiovascular: Denies chest pain. Respiratory: Denies shortness of breath. Gastrointestinal: No abdominal pain.   Genitourinary: Negative for dysuria. Skin: Negative for rash.  ____________________________________________   PHYSICAL EXAM:  VITAL SIGNS: ED Triage Vitals  Enc Vitals Group     BP 10/29/16  1013 124/75     Pulse Rate 10/29/16 1013 (!) 51     Resp  10/29/16 1013 16     Temp 10/29/16 1013 99.2 F (37.3 C)     Temp Source 10/29/16 1013 Oral     SpO2 10/29/16 1013 100 %     Weight 10/29/16 1015 203 lb (92.1 kg)     Height 10/29/16 1015 5\' 9"  (1.753 m)     Head Circumference --      Peak Flow --      Pain Score --      Pain Loc --      Pain Edu? --      Excl. in [place]? --     Constitutional: Alert and oriented. Well appearing and in no acute distress. Eyes: Conjunctivae are normal. PERRL. EOMI. ENT      Head: Normocephalic and atraumatic.Left TMJ nontender. Right TMJ moderate tenderness to direct palpation with pain with opening mouth and grinding audible. Opens mouth well. No facial erythema or rash.      Ears: No erythema, normal TMs bilaterally. No surrounding tenderness, swelling or erythema bilaterally.      Nose: No congestion/rhinnorhea.      Mouth/Throat: Mucous membranes are moist.Oropharynx non-erythematous. No tonsillar swelling or exudate. No dental tenderness to palpation. Neck: No stridor. Supple without meningismus.  Hematological/Lymphatic/Immunilogical: No cervical lymphadenopathy. Cardiovascular: Normal rate, regular rhythm. Grossly normal heart sounds.  Good peripheral circulation. Respiratory: Normal respiratory effort without tachypnea nor retractions. Breath sounds are clear and equal bilaterally. No wheezes, rales, rhonchi. Musculoskeletal: Ambulatory with steady gait. Left lower extremity brace present. Neurologic:  Normal speech and language.  Speech is normal. No gait instability.  Skin:  Skin is warm, dry. Psychiatric: Mood and affect are normal. Speech and behavior are normal. Patient exhibits appropriate insight and judgment   ___________________________________________   LABS (all labs ordered are listed, but only abnormal results are displayed)  Labs Reviewed - No data to display ____________________________________________  PROCEDURES Procedures    INITIAL IMPRESSION / ASSESSMENT AND PLAN / ED  COURSE  Pertinent labs & imaging results that were available during my care of the patient were reviewed by me and considered in my medical decision making (see chart for details).   Well-appearing patient. No acute distress. Suspect right TMJ pain and inflammation. No clinical signs of acute infection. Counseled regarding dental guards, following up with dentist as well as ENT. Patient states that she is unable to take oral or IM NSAIDs or prednisone. Patient states that she does currently take Soma intermittently at night, and patient states that she does not feel like Manuela Neptune works well for her. Discussed with patient we'll stop Soma and will start Flexeril daily at bedtime as needed. Discussed soft food diet and avoidance of frequent jaw movement. Encourage warm compresses as needed. Discussed patient close follow-up.Discussed indication, risks and benefits of medications with patient.  Discussed follow up with Primary care physician this week. Discussed follow up and return parameters including no resolution or any worsening concerns. Patient verbalized understanding and agreed to plan.   ____________________________________________   FINAL CLINICAL IMPRESSION(S) / ED DIAGNOSES  Final diagnoses:  Arthralgia of right temporomandibular joint     Discharge Medication List as of 10/29/2016 10:59 AM    START taking these medications   Details  cyclobenzaprine (FLEXERIL) 10 MG tablet Take 1 tablet (10 mg total) by mouth at bedtime. Do not drive as can cause drowsiness, Starting Wed 10/29/2016, Until Sat 11/08/2016, Normal  Note: This dictation was prepared with Dragon dictation along with smaller phrase technology. Any transcriptional errors that result from this process are unintentional.         Renford Dills, NP 10/29/16 1242

## 2016-11-05 DIAGNOSIS — H6012 Cellulitis of left external ear: Secondary | ICD-10-CM | POA: Diagnosis not present

## 2016-11-12 DIAGNOSIS — M9953 Intervertebral disc stenosis of neural canal of lumbar region: Secondary | ICD-10-CM | POA: Diagnosis not present

## 2016-11-12 DIAGNOSIS — M546 Pain in thoracic spine: Secondary | ICD-10-CM | POA: Diagnosis not present

## 2016-11-13 ENCOUNTER — Other Ambulatory Visit: Payer: Self-pay | Admitting: Orthopedic Surgery

## 2016-11-13 DIAGNOSIS — M9953 Intervertebral disc stenosis of neural canal of lumbar region: Secondary | ICD-10-CM

## 2016-11-14 ENCOUNTER — Ambulatory Visit: Payer: BLUE CROSS/BLUE SHIELD

## 2016-11-14 HISTORY — PX: FL HIP INJECTION (ARMC HX): HXRAD1300

## 2016-11-20 ENCOUNTER — Ambulatory Visit
Admission: RE | Admit: 2016-11-20 | Discharge: 2016-11-20 | Disposition: A | Discharge status: Home / Self Care | Payer: BLUE CROSS/BLUE SHIELD | Source: Ambulatory Visit | Attending: Orthopedic Surgery | Admitting: Orthopedic Surgery

## 2016-11-20 DIAGNOSIS — M9953 Intervertebral disc stenosis of neural canal of lumbar region: Secondary | ICD-10-CM | POA: Diagnosis not present

## 2016-11-20 DIAGNOSIS — M5126 Other intervertebral disc displacement, lumbar region: Secondary | ICD-10-CM | POA: Diagnosis not present

## 2016-11-20 DIAGNOSIS — M47816 Spondylosis without myelopathy or radiculopathy, lumbar region: Secondary | ICD-10-CM | POA: Insufficient documentation

## 2016-11-21 ENCOUNTER — Other Ambulatory Visit: Payer: Self-pay | Admitting: Family Medicine

## 2016-11-24 ENCOUNTER — Ambulatory Visit: Payer: BLUE CROSS/BLUE SHIELD | Admitting: Urology

## 2016-11-24 DIAGNOSIS — C44229 Squamous cell carcinoma of skin of left ear and external auricular canal: Secondary | ICD-10-CM | POA: Diagnosis not present

## 2016-11-24 DIAGNOSIS — D0422 Carcinoma in situ of skin of left ear and external auricular canal: Secondary | ICD-10-CM | POA: Diagnosis not present

## 2016-11-24 MED ORDER — CLINDAMYCIN PHOSPHATE 300 MG/50ML IV SOLN
300.0000 mg | Freq: Once | INTRAVENOUS | Status: AC
  Administered 2016-11-25: 300 mg via INTRAVENOUS

## 2016-11-25 ENCOUNTER — Encounter
Admission: RE | Disposition: A | Discharge status: Home / Self Care | Payer: Self-pay | Source: Ambulatory Visit | Attending: Vascular Surgery

## 2016-11-25 ENCOUNTER — Ambulatory Visit
Admission: RE | Admit: 2016-11-25 | Discharge: 2016-11-25 | Disposition: A | Discharge status: Home / Self Care | Payer: BLUE CROSS/BLUE SHIELD | Source: Ambulatory Visit | Attending: Vascular Surgery | Admitting: Vascular Surgery

## 2016-11-25 DIAGNOSIS — M797 Fibromyalgia: Secondary | ICD-10-CM | POA: Insufficient documentation

## 2016-11-25 DIAGNOSIS — M216X1 Other acquired deformities of right foot: Secondary | ICD-10-CM | POA: Diagnosis not present

## 2016-11-25 DIAGNOSIS — Z8719 Personal history of other diseases of the digestive system: Secondary | ICD-10-CM | POA: Insufficient documentation

## 2016-11-25 DIAGNOSIS — Z96641 Presence of right artificial hip joint: Secondary | ICD-10-CM | POA: Diagnosis not present

## 2016-11-25 DIAGNOSIS — Z85820 Personal history of malignant melanoma of skin: Secondary | ICD-10-CM | POA: Insufficient documentation

## 2016-11-25 DIAGNOSIS — Z9049 Acquired absence of other specified parts of digestive tract: Secondary | ICD-10-CM | POA: Diagnosis not present

## 2016-11-25 DIAGNOSIS — Z6833 Body mass index (BMI) 33.0-33.9, adult: Secondary | ICD-10-CM | POA: Insufficient documentation

## 2016-11-25 DIAGNOSIS — E663 Overweight: Secondary | ICD-10-CM | POA: Insufficient documentation

## 2016-11-25 DIAGNOSIS — Z801 Family history of malignant neoplasm of trachea, bronchus and lung: Secondary | ICD-10-CM | POA: Insufficient documentation

## 2016-11-25 DIAGNOSIS — Z885 Allergy status to narcotic agent status: Secondary | ICD-10-CM | POA: Insufficient documentation

## 2016-11-25 DIAGNOSIS — Z9884 Bariatric surgery status: Secondary | ICD-10-CM | POA: Insufficient documentation

## 2016-11-25 DIAGNOSIS — Z87891 Personal history of nicotine dependence: Secondary | ICD-10-CM | POA: Diagnosis not present

## 2016-11-25 DIAGNOSIS — I10 Essential (primary) hypertension: Secondary | ICD-10-CM | POA: Diagnosis not present

## 2016-11-25 DIAGNOSIS — Z811 Family history of alcohol abuse and dependence: Secondary | ICD-10-CM | POA: Insufficient documentation

## 2016-11-25 DIAGNOSIS — Z9889 Other specified postprocedural states: Secondary | ICD-10-CM | POA: Diagnosis not present

## 2016-11-25 DIAGNOSIS — Q631 Lobulated, fused and horseshoe kidney: Secondary | ICD-10-CM | POA: Diagnosis not present

## 2016-11-25 DIAGNOSIS — M1990 Unspecified osteoarthritis, unspecified site: Secondary | ICD-10-CM | POA: Diagnosis not present

## 2016-11-25 DIAGNOSIS — Z452 Encounter for adjustment and management of vascular access device: Secondary | ICD-10-CM | POA: Insufficient documentation

## 2016-11-25 DIAGNOSIS — E039 Hypothyroidism, unspecified: Secondary | ICD-10-CM | POA: Diagnosis not present

## 2016-11-25 DIAGNOSIS — M216X2 Other acquired deformities of left foot: Secondary | ICD-10-CM | POA: Diagnosis not present

## 2016-11-25 DIAGNOSIS — Z820 Family history of epilepsy and other diseases of the nervous system: Secondary | ICD-10-CM | POA: Insufficient documentation

## 2016-11-25 DIAGNOSIS — Z90711 Acquired absence of uterus with remaining cervical stump: Secondary | ICD-10-CM | POA: Diagnosis not present

## 2016-11-25 DIAGNOSIS — Z888 Allergy status to other drugs, medicaments and biological substances status: Secondary | ICD-10-CM | POA: Diagnosis not present

## 2016-11-25 DIAGNOSIS — Z833 Family history of diabetes mellitus: Secondary | ICD-10-CM | POA: Insufficient documentation

## 2016-11-25 DIAGNOSIS — Z87442 Personal history of urinary calculi: Secondary | ICD-10-CM | POA: Insufficient documentation

## 2016-11-25 DIAGNOSIS — Z808 Family history of malignant neoplasm of other organs or systems: Secondary | ICD-10-CM | POA: Insufficient documentation

## 2016-11-25 DIAGNOSIS — I8290 Acute embolism and thrombosis of unspecified vein: Secondary | ICD-10-CM

## 2016-11-25 DIAGNOSIS — Z8249 Family history of ischemic heart disease and other diseases of the circulatory system: Secondary | ICD-10-CM | POA: Insufficient documentation

## 2016-11-25 DIAGNOSIS — Z981 Arthrodesis status: Secondary | ICD-10-CM | POA: Insufficient documentation

## 2016-11-25 DIAGNOSIS — Z86718 Personal history of other venous thrombosis and embolism: Secondary | ICD-10-CM | POA: Diagnosis not present

## 2016-11-25 DIAGNOSIS — Z96652 Presence of left artificial knee joint: Secondary | ICD-10-CM | POA: Insufficient documentation

## 2016-11-25 DIAGNOSIS — Z882 Allergy status to sulfonamides status: Secondary | ICD-10-CM | POA: Insufficient documentation

## 2016-11-25 DIAGNOSIS — I872 Venous insufficiency (chronic) (peripheral): Secondary | ICD-10-CM | POA: Diagnosis not present

## 2016-11-25 HISTORY — PX: IVC FILTER REMOVAL: CATH118246

## 2016-11-25 SURGERY — IVC FILTER REMOVAL
Anesthesia: Moderate Sedation

## 2016-11-25 MED ORDER — MIDAZOLAM HCL 2 MG/2ML IJ SOLN
INTRAMUSCULAR | Status: DC | PRN
  Administered 2016-11-25 (×2): 2 mg via INTRAVENOUS

## 2016-11-25 MED ORDER — HEPARIN (PORCINE) IN NACL 2-0.9 UNIT/ML-% IJ SOLN
INTRAMUSCULAR | Status: AC
  Filled 2016-11-25: qty 500

## 2016-11-25 MED ORDER — ONDANSETRON HCL 4 MG/2ML IJ SOLN
4.0000 mg | Freq: Once | INTRAMUSCULAR | Status: AC
  Administered 2016-11-25: 4 mg via INTRAVENOUS

## 2016-11-25 MED ORDER — SODIUM CHLORIDE 0.9 % IV SOLN: INTRAVENOUS | Status: DC

## 2016-11-25 MED ORDER — MIDAZOLAM HCL 5 MG/5ML IJ SOLN
INTRAMUSCULAR | Status: AC
  Filled 2016-11-25: qty 5

## 2016-11-25 MED ORDER — ONDANSETRON HCL 4 MG/2ML IJ SOLN
INTRAMUSCULAR | Status: AC
  Filled 2016-11-25: qty 2

## 2016-11-25 MED ORDER — FENTANYL CITRATE (PF) 100 MCG/2ML IJ SOLN
INTRAMUSCULAR | Status: AC
  Filled 2016-11-25: qty 2

## 2016-11-25 MED ORDER — LIDOCAINE HCL (PF) 1 % IJ SOLN
INTRAMUSCULAR | Status: AC
  Filled 2016-11-25: qty 30

## 2016-11-25 MED ORDER — HYDROMORPHONE HCL 1 MG/ML IJ SOLN: 1.0000 mg | Freq: Once | INTRAMUSCULAR | Status: DC | PRN

## 2016-11-25 MED ORDER — CLINDAMYCIN PHOSPHATE 300 MG/50ML IV SOLN
INTRAVENOUS | Status: AC
  Administered 2016-11-25: 300 mg via INTRAVENOUS
  Filled 2016-11-25: qty 50

## 2016-11-25 MED ORDER — LIDOCAINE-EPINEPHRINE (PF) 2 %-1:200000 IJ SOLN
INTRAMUSCULAR | Status: AC
  Filled 2016-11-25: qty 20

## 2016-11-25 MED ORDER — FENTANYL CITRATE (PF) 100 MCG/2ML IJ SOLN
INTRAMUSCULAR | Status: DC | PRN
  Administered 2016-11-25 (×2): 50 ug via INTRAVENOUS

## 2016-11-25 MED ORDER — IOPAMIDOL (ISOVUE-300) INJECTION 61%
INTRAVENOUS | Status: DC | PRN
  Administered 2016-11-25: 15 mL via INTRAVENOUS

## 2016-11-25 MED ORDER — ONDANSETRON HCL 4 MG/2ML IJ SOLN: 4.0000 mg | Freq: Four times a day (QID) | INTRAMUSCULAR | Status: DC | PRN

## 2016-11-25 SURGICAL SUPPLY — 6 items
NEEDLE ENTRY 21GA 7CM ECHOTIP (NEEDLE) ×2 IMPLANT
NEEDLE PERC 18GX7CM (NEEDLE) ×2 IMPLANT
PACK ANGIOGRAPHY (CUSTOM PROCEDURE TRAY) ×2 IMPLANT
SET INTRO CAPELLA COAXIAL (SET/KITS/TRAYS/PACK) ×2 IMPLANT
SET VENACAVA FILTER RETRIEVAL (MISCELLANEOUS) ×2 IMPLANT
WIRE J 3MM .035X145CM (WIRE) ×2 IMPLANT

## 2016-11-25 NOTE — H&P (Signed)
[facility] History & Physical Update  The patient was interviewed and re-examined.  The patient's previous History and Physical has been reviewed and is unchanged.  There is no change in the plan of care. We plan to proceed with the scheduled procedure.  Levora Dredge, MD  11/25/2016, 8:10 AM

## 2016-11-25 NOTE — Discharge Instructions (Signed)
Take Eliquis for a week, then stop taking. After a week, resume Aspirin 81 mg orally every day.

## 2016-11-25 NOTE — Op Note (Signed)
  OPERATIVE NOTE   PRE-OPERATIVE DIAGNOSIS: DVT with PE  POST-OPERATIVE DIAGNOSIS: Same  PROCEDURE: 1. Retrieval of IVC Filter 2. Inferior Vena Cavagram  SURGEON: Renford Dills, M.D.  ANESTHESIA:  Conscious sedation was administered under my direct supervision by the interventional radiology RN. IV Versed plus fentanyl were utilized. Continuous ECG, pulse oximetry and blood pressure was monitored throughout the entire procedure. Conscious sedation was for a total of 25 minutes.  ESTIMATED BLOOD LOSS: Minimal cc  FINDING(S):inferior vena cava is widely patent filter is in place in good position. Filter is removed without incident  SPECIMEN(S):  IVC filter intact  INDICATIONS:   Claire Strong is a 56 y.o. female who presents with DVT and PE. The patient has now tolerated anticoagulation for several months. Therefore, the IVC filter is recommended to be removed. The risks and benefits were reviewed with the patient all questions were answered and they agreed to proceed with IVC filter retrieval. Oral anticoagulation will be continued.  DESCRIPTION: After obtaining full informed written consent, the patient was brought back to the Special Procedure Suite and placed in the supine position.  The patient received IV antibiotics prior to induction.  After obtaining adequate sedation, the patient was prepped and draped in the standard fashion and appropriate time out is called.     Ultrasound was placed in a sterile sleeve.The right neck was then imaged with ultrasound.   Jugular vein was identified it is echolucent and homogeneous indicating patency. 1% lidocaine is then infiltrated under ultrasound visualization and subsequently a Seldinger needle is inserted under real-time ultrasound guidance.  J-wire is then advanced into the inferior vena cava under fluoroscopic guidance. With the tip of the sheath at the confluence of the iliac veins inferior vena caval imaging is performed.  After  review of the image the sheath is repositioned to above the filter and the snares introduced. Snares opened and the hook is secured without difficulty. The filter is then collapsed within the sheath and removed without difficulty.  Sheath is removed by pressures held the patient tolerated the procedure well and there were no immediate complications.  Interpretation: inferior vena cava is widely patent filter is in place in good position. Filter is removed without incident.     COMPLICATIONS: None  CONDITION: [name], M.D. [facility] Office: [phone]   11/25/2016, 12:43 PM

## 2016-11-25 NOTE — H&P (Signed)
[facility] Admission History & Physical  MRN : [id number]  Claire Strong is a 56 y.o. (July 25, 1960) female who presents with chief complaint of No chief complaint on file. Marland Kitchen  History of Present Illness: The patient presents to the office s/p fusion of the left ankle on 09/12/2016.  There were no complications with the surgery.  There is a history of DVT which was identified years ago and was treated with anticoagulation. The presenting symptoms were pain and swelling in the lower extremity.  She has had two prior filters.  Currently she has filter placed preoperatively.  The patient notes the leg continues to be mildly painful with dependency and still swells quite a bite. Symptoms are much better with elevation. The patient notes minimal edema in the morning which steadily worsens throughout the day.   The patient has not been using compression therapy at this point.  No SOB or pleuritic chest pains. No cough or hemoptysis.  No blood per rectum or blood in any sputum. No excessive bruising per the patient.   Current Facility-Administered Medications  Medication Dose Route Frequency Provider Last Rate Last Dose  . 0.9 %  sodium chloride infusion   Intravenous Continuous Stegmayer, Kimberly A, PA-C      . clindamycin (CLEOCIN) 300 MG/50ML IVPB           . clindamycin (CLEOCIN) IVPB 300 mg  300 mg Intravenous Once Stegmayer, Kimberly A, PA-C      . HYDROmorphone (DILAUDID) injection 1 mg  1 mg Intravenous Once PRN Stegmayer, Kimberly A, PA-C      . ondansetron (ZOFRAN) injection 4 mg  4 mg Intravenous Q6H PRN Stegmayer, Ranae Plumber, PA-C        Past Medical History:  Diagnosis Date  . Anxiety   . Bleeding disorder (HCC)   . Bronchial asthma   . Cancer (HCC)    melanoma  . 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 Trula Ore ortho)  . Chronic venous insufficiency 2007   s/p vein stripping and laser ablation  .  Depression   . Diverticulosis    mild by colonoscopy  . Fibromyalgia 02/2014   [place]  . History of DVT (deep vein thrombosis)    DVTs after 1st pregnancy, not on AC 2/2 bleeding ulcer, greenfield filter in place  . History of gastric ulcer 2009  . History of kidney stones   . Horseshoe kidney    sole, R kidney damage  . HTN (hypertension)   . Hypothyroidism   . Kidney stones    s/p renal hematoma after lithotripsy on right  . Lupus anticoagulant disorder (HCC)   . Multinodular goiter    s/p thyroidectomy  . Nasal septal perforation    chronic, ENT rec avoid antihistamine, INS  . Osteoarthritis    ?FM by rheum  . Overweight   . Post-operative nausea and vomiting   . Post-surgical hypothyroidism    for multinodular goiter  . 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 Jacqulyn Ducking  . Seasonal allergies    allergy shots, singulair  . 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 Tachycardia     Past Surgical History:  Procedure Laterality Date  . ABDOMINAL HERNIA REPAIR    . ABDOMINAL HYSTERECTOMY    . ANKLE FUSION Left 05/2013  . APPENDECTOMY  1978  . BACK SURGERY  2009  . CESAREAN SECTION     X 2  .  CHOLECYSTECTOMY  1998  . COLONOSCOPY  05/2012   hyperplastic polyps x2, mild diverticulosis Deatra Ina) rpt 10 yrs  . CYST EXCISION     back  and shoulder  . ESI Right 10/2015, 02/2016   C4/5 then C5/6 ESI ([place]nis)  . ESI Bilateral 06/2015, 08/2015, 03/2016   L4/5 transforaminal ESI (Chasnis)  . FUSION OF TALONAVICULAR JOINT Left 09/12/2016   Procedure: FUSION OF TALONAVICULAR JOINT WITH TRINITY BONE GRAFT;  Surgeon: Samara Deist, DPM;  Location: [facility];  Service: Podiatry;  Laterality: Left;  Marland Kitchen GASTRIC BYPASS  05/21/05   Dr. Frutoso Chase, Roux-en-Y  . Greenfield filter removal  2015   removed 6 wks after surgery  . hammerhead toes Left   . HERNIA REPAIR     X 3  . IVC FILTER INSERTION N/A 09/09/2016   Procedure: IVC Filter Insertion;  Surgeon: Katha Cabal, MD;  Location: [facility];  Service: Cardiovascular;  Laterality: N/A;  . JOINT REPLACEMENT    . lyphoma excision  08/2015   arm  . NASAL SEPTUM SURGERY  1981   deviated septum, repaired [facility]  . PARTIAL HYSTERECTOMY  1989   ovaries remain  . SKIN SURGERY     basel cell  . TONSILLECTOMY  1969  . TOTAL HIP ARTHROPLASTY Right 11/06/2015   Procedure: TOTAL HIP ARTHROPLASTY ANTERIOR APPROACH;  Surgeon: Hessie Knows, MD;  Location: [facility];  Service: Orthopedics;  Laterality: Right;  . TOTAL KNEE ARTHROPLASTY Left 08/2013  . TOTAL THYROIDECTOMY  09/2011   benign path (done for multinodular goiter concern for cancer)    Social History Social History  Substance Use Topics  . Smoking status: Former Smoker    Types: Cigarettes    Quit date: 06/16/2004  . Smokeless tobacco: Never Used     Comment: Socially-no longer  . Alcohol use 0.0 oz/week     Comment: Occasionally-wine    Family History Family History  Problem Relation Age of Onset  . Diabetes Mother        prediabetes  . Alzheimer's disease Mother   . Hyperlipidemia Mother   . Diabetes Father   . Coronary artery disease Father 46       CABG  . Alcohol abuse Father   . Cancer Father        lung  . Cancer Maternal Uncle        brain  . Diabetes Paternal Grandmother   . Stroke Neg Hx   . Colon cancer Neg Hx   . Stomach cancer Neg Hx   . Kidney disease Neg Hx   . Breast cancer Neg Hx   . Kidney cancer Neg Hx   . Bladder Cancer Neg Hx     Allergies  Allergen Reactions  . Sulfa Antibiotics Shortness Of Breath  . Cefuroxime Axetil Other (See Comments)    Bleeding ulcer  . Nutritional Supplements Swelling    Unsure which  . Oxycodone-Acetaminophen Nausea And Vomiting  . Betadine [Povidone Iodine] Rash     REVIEW OF SYSTEMS (Negative unless checked)  Constitutional: [] Weight loss  [] Fever  [] Chills Cardiac: [] Chest pain   [] Chest pressure   [] Palpitations   [] Shortness of breath when laying flat   [] Shortness of breath at  rest   [] Shortness of breath with exertion. Vascular:  [] Pain in legs with walking   [] Pain in legs at rest   [] Pain in legs when laying flat   [] Claudication   [x] Pain in feet when walking  [] Pain in feet  at rest  [] Pain in feet when laying flat   [] History of DVT   [] Phlebitis   [] Swelling in legs   [] Varicose veins   [] Non-healing ulcers Pulmonary:   [] Uses home oxygen   [] Productive cough   [] Hemoptysis   [] Wheeze  [] COPD   [] Asthma Neurologic:  [] Dizziness  [] Blackouts   [] Seizures   [] History of stroke   [] History of TIA  [] Aphasia   [] Temporary blindness   [] Dysphagia   [] Weakness or numbness in arms   [] Weakness or numbness in legs Musculoskeletal:  [] Arthritis   [x] Joint swelling   [x] Joint pain   [] Low back pain Hematologic:  [] Easy bruising  [] Easy bleeding   [] Hypercoagulable state   [] Anemic  [] Hepatitis Gastrointestinal:  [] Blood in stool   [] Vomiting blood  [] Gastroesophageal reflux/heartburn   [] Difficulty swallowing. Genitourinary:  [] Chronic kidney disease   [] Difficult urination  [] Frequent urination  [] Burning with urination   [] Blood in urine Skin:  [] Rashes   [] Ulcers   [] Wounds Psychological:  [] History of anxiety   []  History of major depression.  Physical Examination  Vitals:   11/25/16 0655 11/25/16 0700  BP: (!) 136/108 (!) 131/95  Pulse: 62   Resp: 18   Temp: 98.1 F (36.7 C)   TempSrc: Oral   SpO2: 99%   Weight: 104.3 kg (230 lb)   Height: 5\' 9"  (1.753 m)    Body mass index is 33.97 kg/m. Gen: WD/WN, NAD Head: [place]/AT, No temporalis wasting.  Ear/Nose/Throat: Hearing grossly intact, nares w/o erythema or drainage, oropharynx w/o Erythema/Exudate, Eyes: Sclera non-icteric, conjunctiva clear Neck: Supple, no nuchal rigidity.  No JVD.  Pulmonary:  Good air movement, no increased work of respiration or use of accessory muscles  Cardiac: RRR, normal S1, S2, no Murmurs, rubs or gallops. Vascular:   2+ edema left foot Vessel Right Left  Radial Palpable Palpable   PT Palpable Palpable  DP Palpable Palpable   Gastrointestinal: soft, non-tender/non-distended. No guarding/reflex. No masses, surgical incisions, or scars. Musculoskeletal: M/S 5/5 throughout.  No deformity or atrophy.  2+ edema Neurologic: Sensation grossly intact in extremities.  Symmetrical.  Speech is fluent. Motor exam as listed above. Psychiatric: Judgment intact, Mood & affect appropriate for pt's clinical situation. Dermatologic: No rashes or ulcers noted.  No cellulitis or open wounds. Lymph : No Cervical, Axillary, or Inguinal lymphadenopathy.      CBC Lab Results  Component Value Date   WBC 5.2 09/03/2016   HGB 13.3 09/03/2016   HCT 39.4 09/03/2016   MCV 87.7 09/03/2016   PLT 215 09/03/2016    BMET    Component Value Date/Time   NA 135 09/03/2016 1505   NA 132 (L) 08/27/2013 0616   K 3.6 09/03/2016 1505   K 4.5 08/27/2013 0616   CL 102 09/03/2016 1505   CL 102 08/27/2013 0616   CO2 26 09/03/2016 1505   CO2 27 08/27/2013 0616   GLUCOSE 98 09/03/2016 1505   GLUCOSE 107 (H) 08/27/2013 0616   BUN 13 09/03/2016 1505   BUN 12 08/27/2013 0616   CREATININE 0.95 09/03/2016 1505   CREATININE 0.91 08/27/2013 0616   CREATININE 1.02 09/20/2011   CALCIUM 8.6 (L) 09/03/2016 1505   CALCIUM 7.4 (L) 08/27/2013 0616   CALCIUM 7.7 09/20/2011   GFRNONAA >60 09/03/2016 1505   GFRNONAA >60 08/27/2013 0616   GFRAA >60 09/03/2016 1505   GFRAA >60 08/27/2013 0616   CrCl cannot be calculated (Patient's most recent lab result is older than the maximum 21 days  allowed.).  COAG Lab Results  Component Value Date   INR 1.00 10/31/2015   INR 1.0 06/28/2014   INR 0.9 08/10/2013    Radiology Mr Lumbar Spine Wo Contrast  Result Date: 11/20/2016 CLINICAL DATA:  Low back and bilateral leg pain. Right leg numbness and left leg cramping. Symptoms for 2 months. EXAM: MRI LUMBAR SPINE WITHOUT CONTRAST TECHNIQUE: Multiplanar, multisequence MR imaging of the lumbar spine was performed.  No intravenous contrast was administered. COMPARISON:  CT abdomen and pelvis 09/11/2016. MRI lumbar spine 12/21/2013. FINDINGS: Segmentation:  Standard. Alignment: Maintained. Exaggeration of lordosis and convex left scoliosis are noted. Vertebrae: No fracture or worrisome lesion. Tiny hemangioma in T12 noted. Conus medullaris: Extends to the T12-L1 Level and appears normal. Paraspinal and other soft tissues: Horseshoe kidney is again seen. No acute abnormality. Disc levels: T11-12 and T12-L1 are imaged in the sagittal plane only and negative. L1-2: Minimal disc bulge and mild facet degenerative disease without stenosis, unchanged. L2-3: Minimal disc bulge and mild-to-moderate facet degenerative disease without stenosis, unchanged. L3-4: Moderate facet degenerative change. Tiny annular fissure and shallow disc bulge are identified. The central canal and foramina remain open. The appearance is unchanged. L4-5: Minimal disc bulge and moderate facet degenerative disease. The central canal and foramina are open. L5-S1:  Mild facet degenerative disease.  Otherwise negative. IMPRESSION: No change in mild spondylosis of the lumbar spine without central canal or foraminal narrowing. Electronically Signed   By: Drusilla Kanner M.D.   On: 11/20/2016 14:16      Assessment/Plan 1.Patietn is s/p successful left ankle fusion.  She has a history of hypercoagulable state and had a filter placed preprocedure.  Therefore it is now appropriate to remove the filter.  Risk and benefits were reviewed the patient.  Indications for the procedure were reviewed.  All questions were answered, the patient agrees to proceed.   Levora Dredge, MD  11/25/2016 8:22 AM

## 2016-12-03 DIAGNOSIS — M25552 Pain in left hip: Secondary | ICD-10-CM | POA: Diagnosis not present

## 2016-12-03 DIAGNOSIS — G8929 Other chronic pain: Secondary | ICD-10-CM | POA: Diagnosis not present

## 2016-12-03 DIAGNOSIS — M545 Low back pain: Secondary | ICD-10-CM | POA: Diagnosis not present

## 2016-12-03 DIAGNOSIS — M1612 Unilateral primary osteoarthritis, left hip: Secondary | ICD-10-CM | POA: Diagnosis not present

## 2016-12-05 ENCOUNTER — Ambulatory Visit (INDEPENDENT_AMBULATORY_CARE_PROVIDER_SITE_OTHER): Payer: BLUE CROSS/BLUE SHIELD | Admitting: Vascular Surgery

## 2016-12-09 DIAGNOSIS — M79672 Pain in left foot: Secondary | ICD-10-CM | POA: Diagnosis not present

## 2016-12-09 DIAGNOSIS — N898 Other specified noninflammatory disorders of vagina: Secondary | ICD-10-CM | POA: Diagnosis not present

## 2016-12-09 DIAGNOSIS — N761 Subacute and chronic vaginitis: Secondary | ICD-10-CM | POA: Diagnosis not present

## 2016-12-09 DIAGNOSIS — N952 Postmenopausal atrophic vaginitis: Secondary | ICD-10-CM | POA: Diagnosis not present

## 2016-12-11 ENCOUNTER — Ambulatory Visit (INDEPENDENT_AMBULATORY_CARE_PROVIDER_SITE_OTHER): Payer: BLUE CROSS/BLUE SHIELD | Admitting: Vascular Surgery

## 2016-12-11 ENCOUNTER — Encounter (INDEPENDENT_AMBULATORY_CARE_PROVIDER_SITE_OTHER): Payer: Self-pay | Admitting: Vascular Surgery

## 2016-12-11 VITALS — BP 122/87 | HR 64 | Resp 16 | Ht 69.0 in | Wt 238.0 lb

## 2016-12-11 DIAGNOSIS — I872 Venous insufficiency (chronic) (peripheral): Secondary | ICD-10-CM | POA: Diagnosis not present

## 2016-12-11 DIAGNOSIS — I10 Essential (primary) hypertension: Secondary | ICD-10-CM | POA: Diagnosis not present

## 2016-12-11 DIAGNOSIS — D6862 Lupus anticoagulant syndrome: Secondary | ICD-10-CM

## 2016-12-11 DIAGNOSIS — Z86718 Personal history of other venous thrombosis and embolism: Secondary | ICD-10-CM

## 2016-12-11 DIAGNOSIS — M19072 Primary osteoarthritis, left ankle and foot: Secondary | ICD-10-CM | POA: Diagnosis not present

## 2016-12-12 DIAGNOSIS — M1612 Unilateral primary osteoarthritis, left hip: Secondary | ICD-10-CM | POA: Diagnosis not present

## 2016-12-12 NOTE — Progress Notes (Signed)
MRN : [id number]  Claire Strong is a 56 y.o. (1960-08-11) female who presents with chief complaint of  Chief Complaint  Patient presents with  . Routine Post Op    2-3 week [facility] follow up  .  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  She is status post placement of her IVC filter prior to arthrodesis of the left ankle. On March 30 she underwent successful left ankle arthrodesis by Dr. Ether Griffins. This has healed well. In on 12/04/2016 she had a successful removal of her IVC filter.  She notes she still has some soreness of the right neck.  The patient notes the leg continues to be very painful with dependency and swells quite a bite.  Symptoms are much better with elevation.  The patient notes minimal edema in the morning which steadily worsens throughout the day.    The patient has not been using compression therapy at this point.  No SOB or pleuritic chest pains.  No cough or hemoptysis.  No blood per rectum or blood in any sputum.  No excessive bruising per the patient.       Current Meds  Medication Sig  . acetaminophen (TYLENOL) 500 MG tablet Take 1,000 mg by mouth every 8 (eight) hours as needed for mild pain or moderate pain.  . ACETAMINOPHEN-BUTALBITAL 50-325 MG TABS Take 1 tablet by mouth at bedtime. Reported on 09/03/2015 3 times a week  . albuterol (PROVENTIL HFA;VENTOLIN HFA) 108 (90 BASE) MCG/ACT inhaler Inhale 2 puffs into the lungs every 6 (six) hours as needed.  Marland Kitchen aspirin 81 MG tablet Take 81 mg by mouth daily.  . benazepril (LOTENSIN) 10 MG tablet TAKE ONE TABLET BY MOUTH ONCE DAILY (Patient taking differently: TAKE ONE TABLET BY MOUTH ONCE DAILY AT BEDTIME)  . cetirizine (ZYRTEC) 10 MG tablet Take 10 mg by mouth daily.  . Cholecalciferol (VITAMIN D3) 1000 UNITS tablet Take 2,000 Units by mouth daily.   . citalopram (CELEXA) 40 MG tablet TAKE ONE TABLET BY MOUTH ONCE DAILY  . cyclobenzaprine (FLEXERIL) 10 MG  tablet Take 10 mg by mouth at bedtime.  Marland Kitchen EPINEPHrine (EPI-PEN) 0.3 mg/0.3 mL DEVI Inject 0.3 mLs (0.3 mg total) into the muscle once.  . Fluticasone-Salmeterol (ADVAIR DISKUS) 100-50 MCG/DOSE AEPB Inhale 1 puff into the lungs every 12 (twelve) hours.  . furosemide (LASIX) 20 MG tablet TAKE ONE TABLET BY MOUTH ONCE DAILY  . gabapentin (NEURONTIN) 300 MG capsule Take 1 capsule (300 mg total) by mouth 3 (three) times a week. (Patient taking differently: Take 300 mg by mouth at bedtime. )  . HYDROcodone-acetaminophen (NORCO/VICODIN) 5-325 MG tablet   . HYDROmorphone (DILAUDID) 2 MG tablet Take by mouth.  . levothyroxine (SYNTHROID, LEVOTHROID) 150 MCG tablet Take 150 mcg by mouth daily before breakfast.  . metoprolol tartrate (LOPRESSOR) 25 MG tablet TAKE ONE TABLET BY MOUTH TWICE DAILY  . montelukast (SINGULAIR) 10 MG tablet Take 10 mg by mouth every morning.   . ondansetron (ZOFRAN) 4 MG tablet Take 1 tablet (4 mg total) by mouth every 8 (eight) hours as needed for nausea or vomiting.  . traZODone (DESYREL) 50 MG tablet TAKE ONE-HALF TO ONE TABLET BY MOUTH AT BEDTIME AS NEEDED FOR SLEEP  . Turmeric 500 MG CAPS Take 1 capsule by mouth 2 (two) times daily.  . vitamin B-12 (CYANOCOBALAMIN) 500 MCG tablet Take 500 mcg by mouth daily.  . [DISCONTINUED] apixaban (ELIQUIS) 2.5 MG  TABS tablet Take 1 tablet (2.5 mg total) by mouth 2 (two) times daily.    Past Medical History:  Diagnosis Date  . Anxiety   . Bleeding disorder (HCC)   . Bronchial asthma   . Cancer (HCC)    melanoma  . 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 Trula Ore ortho)  . Chronic venous insufficiency 2007   s/p vein stripping and laser ablation  . Depression   . Diverticulosis    mild by colonoscopy  . Fibromyalgia 02/2014   [place]  . History of DVT (deep vein thrombosis)    DVTs after 1st pregnancy, not on AC 2/2 bleeding ulcer, greenfield filter in place  . History of gastric ulcer 2009  .  History of kidney stones   . Horseshoe kidney    sole, R kidney damage  . HTN (hypertension)   . Hypothyroidism   . Kidney stones    s/p renal hematoma after lithotripsy on right  . Lupus anticoagulant disorder (HCC)   . Multinodular goiter    s/p thyroidectomy  . Nasal septal perforation    chronic, ENT rec avoid antihistamine, INS  . Osteoarthritis    ?FM by rheum  . Overweight   . Post-operative nausea and vomiting   . Post-surgical hypothyroidism    for multinodular goiter  . 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 Jacqulyn Ducking  . Seasonal allergies    allergy shots, singulair  . 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 Tachycardia     Past Surgical History:  Procedure Laterality Date  . ABDOMINAL HERNIA REPAIR    . ABDOMINAL HYSTERECTOMY    . ANKLE FUSION Left 05/2013  . APPENDECTOMY  1978  . BACK SURGERY  2009  . CESAREAN SECTION     X 2  . CHOLECYSTECTOMY  1998  . COLONOSCOPY  05/2012   hyperplastic polyps x2, mild diverticulosis Arlyce Dice) rpt 10 yrs  . CYST EXCISION     back  and shoulder  . ESI Right 10/2015, 02/2016   C4/5 then C5/6 ESI ([place])  . ESI Bilateral 06/2015, 08/2015, 03/2016   L4/5 transforaminal ESI ([place])  . FUSION OF TALONAVICULAR JOINT Left 09/12/2016   Procedure: FUSION OF TALONAVICULAR JOINT WITH TRINITY BONE GRAFT;  Surgeon: Gwyneth Revels, DPM;  Location: [facility] ORS;  Service: Podiatry;  Laterality: Left;  Marland Kitchen GASTRIC BYPASS  05/21/05   Dr. Jacqulyn Ducking, Roux-en-Y  . Greenfield filter removal  2015   removed 6 wks after surgery  . hammerhead toes Left   . HERNIA REPAIR     X 3  . IVC FILTER INSERTION N/A 09/09/2016   Procedure: IVC Filter Insertion;  Surgeon: Renford Dills, MD;  Location: [facility] INVASIVE CV LAB;  Service: Cardiovascular;  Laterality: N/A;  . IVC FILTER REMOVAL N/A 11/25/2016   Procedure: IVC Filter Removal;  Surgeon: Renford Dills, MD;  Location: [facility] INVASIVE CV LAB;  Service: Cardiovascular;  Laterality: N/A;  . JOINT REPLACEMENT     . lyphoma excision  08/2015   arm  . NASAL SEPTUM SURGERY  1981   deviated septum, repaired at [facility]  . PARTIAL HYSTERECTOMY  1989   ovaries remain  . SKIN SURGERY     basel cell  . TONSILLECTOMY  1969  . TOTAL HIP ARTHROPLASTY Right 11/06/2015   Procedure: TOTAL HIP ARTHROPLASTY ANTERIOR APPROACH;  Surgeon: Kennedy Bucker, MD;  Location: [facility] ORS;  Service: Orthopedics;  Laterality: Right;  . TOTAL  KNEE ARTHROPLASTY Left 08/2013  . TOTAL THYROIDECTOMY  09/2011   benign path (done for multinodular goiter concern for cancer)    Social History Social History  Substance Use Topics  . Smoking status: Former Smoker    Types: Cigarettes    Quit date: 06/16/2004  . Smokeless tobacco: Never Used     Comment: Socially-no longer  . Alcohol use 0.0 oz/week     Comment: Occasionally-wine    Family History Family History  Problem Relation Age of Onset  . Diabetes Mother        prediabetes  . Alzheimer's disease Mother   . Hyperlipidemia Mother   . Diabetes Father   . Coronary artery disease Father 27       CABG  . Alcohol abuse Father   . Cancer Father        lung  . Cancer Maternal Uncle        brain  . Diabetes Paternal Grandmother   . Stroke Neg Hx   . Colon cancer Neg Hx   . Stomach cancer Neg Hx   . Kidney disease Neg Hx   . Breast cancer Neg Hx   . Kidney cancer Neg Hx   . Bladder Cancer Neg Hx     Allergies  Allergen Reactions  . Sulfa Antibiotics Shortness Of Breath  . Cefuroxime Axetil Other (See Comments)    Bleeding ulcer  . Nutritional Supplements Swelling    Unsure which  . Oxycodone-Acetaminophen Nausea And Vomiting  . Betadine [Povidone Iodine] Rash     REVIEW OF SYSTEMS (Negative unless checked)  Constitutional: [] Weight loss  [] Fever  [] Chills Cardiac: [] Chest pain   [] Chest pressure   [] Palpitations   [] Shortness of breath when laying flat   [x] Shortness of breath with exertion. Vascular:  [] Pain in legs with walking   [x] Pain in legs With  standing  [] History of DVT   [] Phlebitis   [x] Swelling in legs   [] Varicose veins   [] Non-healing ulcers Pulmonary:   [] Uses home oxygen   [] Productive cough   [] Hemoptysis   [] Wheeze  [] COPD   [] Asthma Neurologic:  [] Dizziness   [] Seizures   [] History of stroke   [] History of TIA  [] Aphasia   [] Vissual changes   [] Weakness or numbness in arm   [] Weakness or numbness in leg Musculoskeletal:   [] Joint swelling   [] Joint pain   [] Low back pain Hematologic:  [] Easy bruising  [] Easy bleeding   [] Hypercoagulable state   [] Anemic Gastrointestinal:  [] Diarrhea   [] Vomiting  [] Gastroesophageal reflux/heartburn   [] Difficulty swallowing. Genitourinary:  [] Chronic kidney disease   [] Difficult urination  [] Frequent urination   [] Blood in urine Skin:  [] Rashes   [] Ulcers  Psychological:  [] History of anxiety   []  History of major depression.  Physical Examination  Vitals:   12/11/16 0829  BP: 122/87  Pulse: 64  Resp: 16  Weight: 238 lb (108 kg)  Height: 5\' 9"  (1.753 m)   Body mass index is 35.15 kg/m. Gen: WD/WN, NAD Head: [place]/AT, No temporalis wasting.  Ear/Nose/Throat: Hearing grossly intact, nares w/o erythema or drainage Eyes: PER, EOMI, sclera nonicteric.  Neck: Supple, no large masses.   Pulmonary:  Good air movement, no audible wheezing bilaterally, no use of accessory muscles.  Cardiac: RRR, no JVD Vascular: The right neck is supple no from nodule or mass. No evidence of hematoma femoral formation Vessel Right Left  Radial Palpable Palpable  Brachial Palpable Palpable  Carotid Palpable Palpable  Gastrointestinal: Non-distended. No guarding/no peritoneal  signs.  Musculoskeletal: M/S 5/5 throughout.  Moderate left ankle deformity but no atrophy.  Neurologic: CN 2-12 intact. Symmetrical.  Speech is fluent. Motor exam as listed above. Psychiatric: Judgment intact, Mood & affect appropriate for pt's clinical situation. Dermatologic: No rashes or ulcers noted.  No changes consistent with  cellulitis. Lymph : No lichenification or skin changes of chronic lymphedema.  CBC Lab Results  Component Value Date   WBC 5.2 09/03/2016   HGB 13.3 09/03/2016   HCT 39.4 09/03/2016   MCV 87.7 09/03/2016   PLT 215 09/03/2016    BMET    Component Value Date/Time   NA 135 09/03/2016 1505   NA 132 (L) 08/27/2013 0616   K 3.6 09/03/2016 1505   K 4.5 08/27/2013 0616   CL 102 09/03/2016 1505   CL 102 08/27/2013 0616   CO2 26 09/03/2016 1505   CO2 27 08/27/2013 0616   GLUCOSE 98 09/03/2016 1505   GLUCOSE 107 (H) 08/27/2013 0616   BUN 13 09/03/2016 1505   BUN 12 08/27/2013 0616   CREATININE 0.95 09/03/2016 1505   CREATININE 0.91 08/27/2013 0616   CREATININE 1.02 09/20/2011   CALCIUM 8.6 (L) 09/03/2016 1505   CALCIUM 7.4 (L) 08/27/2013 0616   CALCIUM 7.7 09/20/2011   GFRNONAA >60 09/03/2016 1505   GFRNONAA >60 08/27/2013 0616   GFRAA >60 09/03/2016 1505   GFRAA >60 08/27/2013 0616   CrCl cannot be calculated (Patient's most recent lab result is older than the maximum 21 days allowed.).  COAG Lab Results  Component Value Date   INR 1.00 10/31/2015   INR 1.0 06/28/2014   INR 0.9 08/10/2013    Radiology Mr Lumbar Spine Wo Contrast  Result Date: 11/20/2016 CLINICAL DATA:  Low back and bilateral leg pain. Right leg numbness and left leg cramping. Symptoms for 2 months. EXAM: MRI LUMBAR SPINE WITHOUT CONTRAST TECHNIQUE: Multiplanar, multisequence MR imaging of the lumbar spine was performed. No intravenous contrast was administered. COMPARISON:  CT abdomen and pelvis 09/11/2016. MRI lumbar spine 12/21/2013. FINDINGS: Segmentation:  Standard. Alignment: Maintained. Exaggeration of lordosis and convex left scoliosis are noted. Vertebrae: No fracture or worrisome lesion. Tiny hemangioma in T12 noted. Conus medullaris: Extends to the T12-L1 Level and appears normal. Paraspinal and other soft tissues: Horseshoe kidney is again seen. No acute abnormality. Disc levels: T11-12 and T12-L1  are imaged in the sagittal plane only and negative. L1-2: Minimal disc bulge and mild facet degenerative disease without stenosis, unchanged. L2-3: Minimal disc bulge and mild-to-moderate facet degenerative disease without stenosis, unchanged. L3-4: Moderate facet degenerative change. Tiny annular fissure and shallow disc bulge are identified. The central canal and foramina remain open. The appearance is unchanged. L4-5: Minimal disc bulge and moderate facet degenerative disease. The central canal and foramina are open. L5-S1:  Mild facet degenerative disease.  Otherwise negative. IMPRESSION: No change in mild spondylosis of the lumbar spine without central canal or foraminal narrowing. Electronically Signed   By: Drusilla Kanner M.D.   On: 11/20/2016 14:16    Assessment/Plan 1. History of DVT (deep vein thrombosis) Patient is status post successful removal of her IVC filter. She is back on her Eliquis therapy. She will continue graduated compression. She will follow up with Dr. Ether Griffins regarding her left ankle. She did mention after seeing Dr. Tobin Chad recently she is likely to need a hip replacement and I stressed to her that prior to any major orthopedic surgery we would likely place a filter once again. She acknowledged this. She  will follow-up with me when necessary.  2. Chronic venous insufficiency No surgery or intervention at this point in time.    I have had a long discussion with the patient regarding venous insufficiency and why it  causes symptoms. I have discussed with the patient the chronic skin changes that accompany venous insufficiency and the long term sequela such as infection and ulceration.  Patient will begin wearing graduated compression stockings class 1 (20-30 mmHg) or compression wraps on a daily basis a prescription was given. The patient will put the stockings on first thing in the morning and removing them in the evening. The patient is instructed specifically not to sleep in the  stockings.    In addition, behavioral modification including several periods of elevation of the lower extremities during the day will be continued. I have demonstrated that proper elevation is a position with the ankles at heart level.  The patient is instructed to begin routine exercise, especially walking on a daily basis  Following the review of the ultrasound the patient will follow up in 2-3 months to reassess the degree of swelling and the control that graduated compression stockings or compression wraps  is offering.   The patient can be assessed for a Lymph Pump at that time  3. Essential hypertension Continue antihypertensive medications as already ordered, these medications have been reviewed and there are no changes at this time.   4. Osteoarthritis of left ankle, unspecified osteoarthritis type Continue nonsteroidal anti-inflammatories as already ordered, these medications have been reviewed and there are no changes at this time.   5. Lupus anticoagulant disorder (HCC) She will continue lifelong anticoagulation secondary to her lupus anticoagulant. She has restarted her Eliquis    Levora Dredge, MD  12/12/2016 11:57 AM

## 2016-12-20 ENCOUNTER — Encounter: Payer: Self-pay | Admitting: Family Medicine

## 2016-12-24 DIAGNOSIS — M79672 Pain in left foot: Secondary | ICD-10-CM | POA: Diagnosis not present

## 2016-12-24 DIAGNOSIS — S93492A Sprain of other ligament of left ankle, initial encounter: Secondary | ICD-10-CM | POA: Diagnosis not present

## 2017-01-07 DIAGNOSIS — N898 Other specified noninflammatory disorders of vagina: Secondary | ICD-10-CM | POA: Diagnosis not present

## 2017-01-07 DIAGNOSIS — B379 Candidiasis, unspecified: Secondary | ICD-10-CM | POA: Diagnosis not present

## 2017-01-29 ENCOUNTER — Encounter: Payer: Self-pay | Admitting: Family Medicine

## 2017-02-08 NOTE — Progress Notes (Signed)
Cardiology Office Note  Date:  02/09/2017   ID:  Claire Strong, Claire Strong 1960-08-07, MRN [id number]  PCP:  Ria Bush, MD   Chief Complaint  Patient presents with  . other    12 month f/u c/o chest pain , elevated BP and heaches for about 3 wks. Meds reviewed verbally with pt.    HPI:  56 year old woman with h/o  lupus anticoagulant with remote DVT,  HTN,  horseshoe kidney,  asthma  multinodular goiter s/p thyroidectomy, Obesity,   s/p roux en y gastric bypass,   post surgical hypertensive/palpitation episode associated with severe HA seen at [facility] ER, long history of palpitations, atrial tachycardia. prior Holter monitor showed atrial tachycardia.  who presents for routine evaluation of her palpitations.  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  report discussed with her in detail She does not want a cholesterol medication   chronic back discomfort. Out of work secondary to chronic pain  New hip Nov 06 2015, Bulging disk, nonsurgical situation  EKG on today's visit shows sinus bradycardia with rate 56 bpm, no significant ST or T-wave changes  She takes Lasix for leg edema History of weight loss after GI surgery  Prior total knee replacement on the left  Other past medical history  History of varicose veins.   History of brain surgery on the left.   Father with CAD and CABG at 51 yo, was smoker.  PMH:   has a past medical history of Anxiety; Bleeding disorder ([place]); Bronchial asthma; Cancer ([place]); Cavovarus deformity of foot; Chronic venous insufficiency (2007); Depression;  Diverticulosis; Fibromyalgia (02/2014); History of DVT (deep vein thrombosis); History of gastric ulcer (2009); History of kidney stones; Horseshoe kidney; HTN (hypertension); Hypothyroidism; Kidney stones; Lupus anticoagulant disorder ([place]); Multinodular goiter; Nasal septal perforation; Osteoarthritis; Overweight; Post-operative nausea and vomiting; Post-surgical hypothyroidism; S/P gastric bypass (05/21/2005); Seasonal allergies; Sight deterioration; and Tachycardia.  PSH:    Past Surgical History:  Procedure Laterality Date  . ABDOMINAL HERNIA REPAIR    . ABDOMINAL HYSTERECTOMY    . ANKLE FUSION Left 05/2013  . APPENDECTOMY  1978  . BACK SURGERY  2009  . CESAREAN SECTION     X 2  . CHOLECYSTECTOMY  1998  . COLONOSCOPY  05/2012   hyperplastic polyps x2, mild diverticulosis Deatra Ina) rpt 10 yrs  . CYST EXCISION     back  and shoulder  . ESI Right 10/2015, 02/2016   C4/5 then C5/6 ESI ([place])  . ESI Bilateral 06/2015, 08/2015, 03/2016   L4/5 transforaminal ESI ([place])  . FL HIP INJECTION ([facility] HX) Left 11/2016   Dr Sharlet Salina  . FUSION OF TALONAVICULAR JOINT Left 09/12/2016   Procedure: FUSION OF TALONAVICULAR JOINT WITH TRINITY BONE GRAFT;  Surgeon: Samara Deist, DPM;  Location: [facility] ORS;  Service: Podiatry;  Laterality: Left;  Marland Kitchen GASTRIC BYPASS  05/21/05   Dr. Frutoso Chase, Roux-en-Y  . [place] filter removal  2015   removed 6 wks after surgery  . hammerhead toes Left   . HERNIA REPAIR     X 3  . IVC FILTER INSERTION N/A 09/09/2016   Procedure: IVC Filter Insertion;  Surgeon: Katha Cabal, MD;  Location: [facility] INVASIVE CV LAB;  Service: Cardiovascular;  Laterality: N/A;  . IVC FILTER REMOVAL N/A 11/25/2016   Procedure: IVC Filter Removal;  Surgeon: Renford Dills, MD;  Location: [facility] INVASIVE CV LAB;  Service: Cardiovascular;  Laterality: N/A;  . JOINT REPLACEMENT    . lyphoma excision  08/2015   arm  . NASAL SEPTUM SURGERY  1981   deviated septum, repaired at [facility]  . PARTIAL  HYSTERECTOMY  1989   ovaries remain  . SKIN SURGERY     basel cell  . TONSILLECTOMY  1969  . TOTAL HIP ARTHROPLASTY Right 11/06/2015   Procedure: TOTAL HIP ARTHROPLASTY ANTERIOR APPROACH;  Surgeon: Kennedy Bucker, MD;  Location: [facility] ORS;  Service: Orthopedics;  Laterality: Right;  . TOTAL KNEE ARTHROPLASTY Left 08/2013  . TOTAL THYROIDECTOMY  09/2011   benign path (done for multinodular goiter concern for cancer)    Current Outpatient Prescriptions  Medication Sig Dispense Refill  . acetaminophen (TYLENOL) 500 MG tablet Take 1,000 mg by mouth every 8 (eight) hours as needed for mild pain or moderate pain.    . ACETAMINOPHEN-BUTALBITAL 50-325 MG TABS Take 1 tablet by mouth at bedtime. Reported on 09/03/2015 3 times a week    . albuterol (PROVENTIL HFA;VENTOLIN HFA) 108 (90 BASE) MCG/ACT inhaler Inhale 2 puffs into the lungs every 6 (six) hours as needed. 1 Inhaler 11  . aspirin 81 MG tablet Take 81 mg by mouth daily.    . benazepril (LOTENSIN) 10 MG tablet TAKE ONE TABLET BY MOUTH ONCE DAILY (Patient taking differently: TAKE ONE TABLET BY MOUTH ONCE DAILY AT BEDTIME) 90 tablet 3  . cetirizine (ZYRTEC) 10 MG tablet Take 10 mg by mouth daily.    . Cholecalciferol (VITAMIN D3) 1000 UNITS tablet Take 2,000 Units by mouth daily.     . citalopram (CELEXA) 40 MG tablet TAKE ONE TABLET BY MOUTH ONCE DAILY 90 tablet 3  . cyclobenzaprine (FLEXERIL) 10 MG tablet Take 10 mg by mouth at bedtime.    Marland Kitchen EPINEPHrine (EPI-PEN) 0.3 mg/0.3 mL DEVI Inject 0.3 mLs (0.3 mg total) into the muscle once. 1 Device 2  . Fluticasone-Salmeterol (ADVAIR DISKUS) 100-50 MCG/DOSE AEPB Inhale 1 puff into the lungs every 12 (twelve) hours. 60 each 3  . furosemide (LASIX) 20 MG tablet TAKE ONE TABLET BY MOUTH ONCE DAILY 90 tablet 2  . gabapentin (NEURONTIN) 300 MG capsule Take 1 capsule (300 mg total) by mouth 3 (three) times a week. (Patient taking differently: Take 300 mg by mouth at bedtime. ) 12 capsule 11  .  HYDROcodone-acetaminophen (NORCO/VICODIN) 5-325 MG tablet   0  . HYDROmorphone (DILAUDID) 2 MG tablet Take by mouth.    . levothyroxine (SYNTHROID, LEVOTHROID) 150 MCG tablet Take 150 mcg by mouth daily before breakfast.    . metoprolol tartrate (LOPRESSOR) 25 MG tablet TAKE ONE TABLET BY MOUTH TWICE DAILY 180 tablet 1  . montelukast (SINGULAIR) 10 MG tablet Take 10 mg by mouth every morning.     . ondansetron (ZOFRAN) 4 MG tablet Take 1 tablet (4 mg total) by mouth every 8 (eight) hours as needed for nausea or vomiting. 20 tablet 0  . traZODone (DESYREL) 50 MG tablet TAKE ONE-HALF TO ONE TABLET BY MOUTH AT BEDTIME AS NEEDED FOR SLEEP 90 tablet 1  . Turmeric 500 MG CAPS Take 1 capsule by mouth 2 (two) times daily.    . vitamin B-12 (CYANOCOBALAMIN) 500 MCG tablet Take 500 mcg by mouth daily.     No  current facility-administered medications for this visit.      Allergies:   Sulfa antibiotics; Cefuroxime axetil; Nutritional supplements; Oxycodone-acetaminophen; and Betadine [povidone iodine]   Social History:  The patient  reports that she quit smoking about 12 years ago. Her smoking use included Cigarettes. She has never used smokeless tobacco. She reports that she drinks alcohol. She reports that she does not use drugs.   Family History:   family history includes Alcohol abuse in her father; Alzheimer's disease in her mother; Cancer in her father and maternal uncle; Coronary artery disease (age of onset: 73) in her father; Diabetes in her father, mother, and paternal grandmother; Hyperlipidemia in her mother.    Review of Systems: Review of Systems  Constitutional: Positive for malaise/fatigue.  Respiratory: Negative.   Cardiovascular: Positive for chest pain.  Gastrointestinal: Negative.   Musculoskeletal: Negative.   Neurological: Positive for headaches.  Psychiatric/Behavioral: Negative.   All other systems reviewed and are negative.    PHYSICAL EXAM: VS:  BP 108/75 (BP  Location: Left Arm, Patient Position: Sitting, Cuff Size: Large)   Pulse (!) 56   Ht 5\' 9"  (1.753 m)   Wt 239 lb 8 oz (108.6 kg)   BMI 35.37 kg/m  , BMI Body mass index is 35.37 kg/m. GEN: Well nourished, well developed, in no acute distress, obese  HEENT: normal  Neck: no JVD, carotid bruits, or masses Cardiac: RRR; no murmurs, rubs, or gallops,no edema ,  varicose veins bilaterally Respiratory:  clear to auscultation bilaterally, normal work of breathing GI: soft, nontender, nondistended, + BS MS: no deformity or atrophy  Skin: warm and dry, no rash Neuro:  Strength and sensation are intact Psych: euthymic mood, full affect    Recent Labs: 09/03/2016: ALT 15; BUN 13; Creatinine, Ser 0.95; Hemoglobin 13.3; Platelets 215; Potassium 3.6; Sodium 135 09/26/2016: TSH 0.427    Lipid Panel Lab Results  Component Value Date   CHOL 200 03/25/2016   HDL 39.50 03/25/2016   LDLCALC 130 (H) 03/25/2016   TRIG 151.0 (H) 03/25/2016      Wt Readings from Last 3 Encounters:  02/09/17 239 lb 8 oz (108.6 kg)  12/11/16 238 lb (108 kg)  11/25/16 230 lb (104.3 kg)       ASSESSMENT AND PLAN:   Atrial tachycardia (HCC) - Plan: EKG 12-Lead No significant recent sx, On metoprolol BID. Asymptomatic bradycardia  Essential hypertension Blood pressure is well controlled on today's visit. No changes made to the medications.  Localized edema Venous insuff, recommended compression hose She feels swelling is from needing another surgery  chest pain/headache Suspect secondary to stress, atypical in nature Low risk She does not want a cholesterol medication for mild after seen in her aorta on CT scan   Total encounter time more than 25 minutes  Greater than 50% was spent in counseling and coordination of care with the patient   Disposition:   F/U  12 months as needed   Orders Placed This Encounter  Procedures  . EKG 12-Lead     Signed, 01/25/17, M.D., Ph.D. 02/09/2017  [facility]  [facility] [place], [place]

## 2017-02-09 ENCOUNTER — Encounter: Payer: Self-pay | Admitting: Cardiovascular Disease

## 2017-02-09 ENCOUNTER — Ambulatory Visit (INDEPENDENT_AMBULATORY_CARE_PROVIDER_SITE_OTHER): Payer: BLUE CROSS/BLUE SHIELD | Admitting: Cardiovascular Disease

## 2017-02-09 VITALS — BP 108/75 | HR 56 | Ht 69.0 in | Wt 239.5 lb

## 2017-02-09 DIAGNOSIS — D6862 Lupus anticoagulant syndrome: Secondary | ICD-10-CM

## 2017-02-09 DIAGNOSIS — I739 Peripheral vascular disease, unspecified: Secondary | ICD-10-CM

## 2017-02-09 DIAGNOSIS — Z9884 Bariatric surgery status: Secondary | ICD-10-CM | POA: Diagnosis not present

## 2017-02-09 DIAGNOSIS — I10 Essential (primary) hypertension: Secondary | ICD-10-CM | POA: Diagnosis not present

## 2017-02-09 DIAGNOSIS — I471 Supraventricular tachycardia: Secondary | ICD-10-CM | POA: Diagnosis not present

## 2017-02-09 NOTE — Patient Instructions (Signed)

## 2017-02-10 DIAGNOSIS — S93492D Sprain of other ligament of left ankle, subsequent encounter: Secondary | ICD-10-CM | POA: Diagnosis not present

## 2017-02-11 ENCOUNTER — Other Ambulatory Visit: Payer: Self-pay | Admitting: Family Medicine

## 2017-02-11 NOTE — Telephone Encounter (Signed)
Lm on pts vm requesting a call back 

## 2017-02-11 NOTE — Telephone Encounter (Signed)
Last office visit 08/28/2016.  Not on current medication list. Refill?

## 2017-02-11 NOTE — Telephone Encounter (Signed)
I believe we switched her from soma to flexeril? Can we call pt and see which she is taking thanks

## 2017-02-12 NOTE — Telephone Encounter (Signed)
Pt called back. CMA unavail. She said she prefers the soma. Dr Rosita Kea switched her to flexeril but it doesn't work for her so she wants the soma back.

## 2017-02-13 NOTE — Telephone Encounter (Signed)
Attempted to contact pharmacy; line busy 

## 2017-02-13 NOTE — Telephone Encounter (Signed)
rx called into pharmacy

## 2017-02-13 NOTE — Telephone Encounter (Signed)
plz phone in. 

## 2017-02-15 ENCOUNTER — Other Ambulatory Visit: Payer: Self-pay | Admitting: Family Medicine

## 2017-02-23 DIAGNOSIS — M1612 Unilateral primary osteoarthritis, left hip: Secondary | ICD-10-CM | POA: Diagnosis not present

## 2017-03-11 ENCOUNTER — Encounter
Admission: RE | Admit: 2017-03-11 | Discharge: 2017-03-11 | Disposition: A | Discharge status: Home / Self Care | Payer: BLUE CROSS/BLUE SHIELD | Source: Ambulatory Visit | Attending: Orthopedic Surgery | Admitting: Orthopedic Surgery

## 2017-03-11 ENCOUNTER — Other Ambulatory Visit: Payer: Self-pay | Admitting: Family Medicine

## 2017-03-11 ENCOUNTER — Encounter: Payer: Self-pay | Admitting: Cardiovascular Disease

## 2017-03-11 DIAGNOSIS — Z01812 Encounter for preprocedural laboratory examination: Secondary | ICD-10-CM | POA: Diagnosis not present

## 2017-03-11 HISTORY — DX: Basal cell carcinoma of skin, unspecified: C44.91

## 2017-03-11 LAB — CBC
HCT: 34.6 % — ABNORMAL LOW (ref 35.0–47.0)
Hemoglobin: 11.6 g/dL — ABNORMAL LOW (ref 12.0–16.0)
MCH: 26.4 pg (ref 26.0–34.0)
MCHC: 33.4 g/dL (ref 32.0–36.0)
MCV: 79.1 fL — ABNORMAL LOW (ref 80.0–100.0)
Platelets: 201 K/uL (ref 150–440)
RBC: 4.38 MIL/uL (ref 3.80–5.20)
RDW: 14 % (ref 11.5–14.5)
WBC: 5 K/uL (ref 3.6–11.0)

## 2017-03-11 LAB — URINALYSIS, ROUTINE W REFLEX MICROSCOPIC
Bilirubin Urine: NEGATIVE
Glucose, UA: NEGATIVE mg/dL
HGB URINE DIPSTICK: NEGATIVE
KETONES UR: NEGATIVE mg/dL
Leukocytes, UA: NEGATIVE
Nitrite: NEGATIVE
PROTEIN: NEGATIVE mg/dL
Specific Gravity, Urine: 1.004 — ABNORMAL LOW (ref 1.005–1.030)
pH: 5 (ref 5.0–8.0)

## 2017-03-11 LAB — BASIC METABOLIC PANEL WITH GFR
Anion gap: 7 (ref 5–15)
BUN: 16 mg/dL (ref 6–20)
CO2: 25 mmol/L (ref 22–32)
Calcium: 8.3 mg/dL — ABNORMAL LOW (ref 8.9–10.3)
Chloride: 103 mmol/L (ref 101–111)
Creatinine, Ser: 0.84 mg/dL (ref 0.44–1.00)
GFR calc Af Amer: >60 mL/min
GFR calc non Af Amer: >60 mL/min
Glucose, Bld: 94 mg/dL (ref 65–99)
Potassium: 3.7 mmol/L (ref 3.5–5.1)
Sodium: 135 mmol/L (ref 135–145)

## 2017-03-11 LAB — SEDIMENTATION RATE: Sed Rate: 8 mm/h (ref 0–30)

## 2017-03-11 LAB — TYPE AND SCREEN
ABO/RH(D): A POS
ANTIBODY SCREEN: NEGATIVE

## 2017-03-11 LAB — SURGICAL PCR SCREEN
MRSA, PCR: POSITIVE — AB
Staphylococcus aureus: POSITIVE — AB

## 2017-03-11 LAB — PROTIME-INR
INR: 1.02
Prothrombin Time: 13.3 seconds (ref 11.4–15.2)

## 2017-03-11 LAB — APTT: APTT: 33 s (ref 24–36)

## 2017-03-11 MED ORDER — TRAZODONE HCL 50 MG PO TABS: ORAL_TABLET | ORAL | 0 refills | Status: DC

## 2017-03-11 NOTE — Patient Instructions (Signed)
Your procedure is scheduled on: Thursday, March 19, 2017 Report to Same Day Surgery on the 2nd floor in the [facility] To find out your arrival time, please call [phone] between 1PM - 3PM on: Wednesday, March 18, 2017  REMEMBER: Instructions that are not followed completely may result in serious medical risk up to and including death; or upon the discretion of your surgeon and anesthesiologist your surgery may need to be rescheduled.  Do not eat food or drink liquids after midnight. No gum chewing or hard candies.  You may however, drink CLEAR liquids up to 2 hours before you are scheduled to arrive at the hospital for your procedure.  Do not drink clear liquids within 2 hours of your scheduled arrival to the hospital as this may lead to your procedure being delayed or rescheduled.  Clear liquids include: - water  - apple juice without pulp - clear gatorade - black coffee or tea (NO milk, creamers, sugars) DO NOT drink anything not on this list.  Type 1 and Type 2 diabetics should only drink water.  No Alcohol for 24 hours before or after surgery.  No Smoking for 24 hours prior to surgery.  Notify your doctor if there is any change in your medical condition (cold, fever, infection).  Do not wear jewelry, make-up, hairpins, clips or nail polish.  Do not wear lotions, powders, or perfumes.   Do not shave 48 hours prior to surgery.   Contacts and dentures may not be worn into surgery.  Do not bring valuables to the hospital. [facility] is not responsible for any belongings or valuables.   TAKE THESE MEDICATIONS THE MORNING OF SURGERY WITH A SIP OF WATER:  1.  CITALOPRAM 2.  LEVOTHYROXINE 3.  METOPROLOL 4.  ADVAIR INHALER  Use CHG Soap as directed on instruction sheet.  Use inhalers on the day of surgery and bring to the hospital.  Follow recommendations from Cardiologist regarding stopping Aspirin.  NOW!  Stop Anti-inflammatories such as Advil, Aleve,  Ibuprofen, Motrin, Naproxen, Naprosyn, Goodie powder, or aspirin products. (May take Tylenol or Acetaminophen if needed.)  NOW!  Stop supplements until after surgery: TURMERIC  (May continue Vitamin D, Vitamin B, and multivitamin.)  If you are being admitted to the hospital overnight, leave your suitcase in the car. After surgery it may be brought to your room.  Please call the number above if you have any questions about these instructions.

## 2017-03-12 ENCOUNTER — Telehealth: Payer: Self-pay | Admitting: Cardiovascular Disease

## 2017-03-12 LAB — URINE CULTURE: CULTURE: NO GROWTH

## 2017-03-12 NOTE — Telephone Encounter (Signed)
Left voicemail message to call back for patient.

## 2017-03-12 NOTE — Telephone Encounter (Signed)
Acceptable risk for surgery no further testing needed Okay to stop aspirin 5 days before procedure if needed

## 2017-03-12 NOTE — Telephone Encounter (Signed)
Received request for cardiac clearance and instructions for Aspirin 81 mg for patients upcoming left total hip arthroplasty with Dr. Pernell Dupre. Patient was last seen 02/09/17. Clearance will need to be routed to Fax # [phone]

## 2017-03-12 NOTE — Telephone Encounter (Signed)
Routed to number provided.

## 2017-03-16 NOTE — Pre-Procedure Instructions (Signed)
Cleared by dr Mariah Milling acceptable risk 03/12/17

## 2017-03-18 MED ORDER — CLINDAMYCIN PHOSPHATE 900 MG/50ML IV SOLN
900.0000 mg | Freq: Once | INTRAVENOUS | Status: AC
  Administered 2017-03-19: 900 mg via INTRAVENOUS

## 2017-03-19 ENCOUNTER — Encounter
Admission: RE | Disposition: A | Discharge status: Home Health | Payer: Self-pay | Source: Ambulatory Visit | Attending: Orthopedic Surgery

## 2017-03-19 ENCOUNTER — Inpatient Hospital Stay: Payer: BLUE CROSS/BLUE SHIELD

## 2017-03-19 ENCOUNTER — Encounter: Payer: Self-pay | Admitting: Emergency Medicine

## 2017-03-19 ENCOUNTER — Inpatient Hospital Stay
Admission: RE | Admit: 2017-03-19 | Discharge: 2017-03-21 | DRG: 470 | Disposition: A | Discharge status: Home Health | Payer: BLUE CROSS/BLUE SHIELD | Source: Ambulatory Visit | Attending: Orthopedic Surgery | Admitting: Orthopedic Surgery

## 2017-03-19 ENCOUNTER — Inpatient Hospital Stay: Payer: BLUE CROSS/BLUE SHIELD | Admitting: Anesthesiology

## 2017-03-19 DIAGNOSIS — Z9884 Bariatric surgery status: Secondary | ICD-10-CM

## 2017-03-19 DIAGNOSIS — Z79890 Hormone replacement therapy (postmenopausal): Secondary | ICD-10-CM | POA: Diagnosis not present

## 2017-03-19 DIAGNOSIS — I739 Peripheral vascular disease, unspecified: Secondary | ICD-10-CM | POA: Diagnosis not present

## 2017-03-19 DIAGNOSIS — Z96642 Presence of left artificial hip joint: Secondary | ICD-10-CM | POA: Diagnosis not present

## 2017-03-19 DIAGNOSIS — G8918 Other acute postprocedural pain: Secondary | ICD-10-CM

## 2017-03-19 DIAGNOSIS — Z419 Encounter for procedure for purposes other than remedying health state, unspecified: Secondary | ICD-10-CM

## 2017-03-19 DIAGNOSIS — Z7982 Long term (current) use of aspirin: Secondary | ICD-10-CM

## 2017-03-19 DIAGNOSIS — M1612 Unilateral primary osteoarthritis, left hip: Secondary | ICD-10-CM | POA: Diagnosis not present

## 2017-03-19 DIAGNOSIS — I959 Hypotension, unspecified: Secondary | ICD-10-CM | POA: Diagnosis not present

## 2017-03-19 DIAGNOSIS — Z79899 Other long term (current) drug therapy: Secondary | ICD-10-CM | POA: Diagnosis not present

## 2017-03-19 DIAGNOSIS — D6862 Lupus anticoagulant syndrome: Secondary | ICD-10-CM | POA: Diagnosis present

## 2017-03-19 DIAGNOSIS — Z96652 Presence of left artificial knee joint: Secondary | ICD-10-CM | POA: Diagnosis present

## 2017-03-19 DIAGNOSIS — Z8711 Personal history of peptic ulcer disease: Secondary | ICD-10-CM | POA: Diagnosis not present

## 2017-03-19 DIAGNOSIS — D62 Acute posthemorrhagic anemia: Secondary | ICD-10-CM | POA: Diagnosis not present

## 2017-03-19 DIAGNOSIS — J45909 Unspecified asthma, uncomplicated: Secondary | ICD-10-CM | POA: Diagnosis not present

## 2017-03-19 DIAGNOSIS — I10 Essential (primary) hypertension: Secondary | ICD-10-CM | POA: Diagnosis present

## 2017-03-19 DIAGNOSIS — E890 Postprocedural hypothyroidism: Secondary | ICD-10-CM | POA: Diagnosis present

## 2017-03-19 DIAGNOSIS — Z471 Aftercare following joint replacement surgery: Secondary | ICD-10-CM | POA: Diagnosis not present

## 2017-03-19 DIAGNOSIS — Z96641 Presence of right artificial hip joint: Secondary | ICD-10-CM | POA: Diagnosis not present

## 2017-03-19 DIAGNOSIS — Z86718 Personal history of other venous thrombosis and embolism: Secondary | ICD-10-CM

## 2017-03-19 DIAGNOSIS — Z85820 Personal history of malignant melanoma of skin: Secondary | ICD-10-CM

## 2017-03-19 HISTORY — PX: TOTAL HIP ARTHROPLASTY: SHX124

## 2017-03-19 LAB — CBC
HEMATOCRIT: 31.1 % — AB (ref 35.0–47.0)
Hemoglobin: 10.6 g/dL — ABNORMAL LOW (ref 12.0–16.0)
MCH: 27 pg (ref 26.0–34.0)
MCHC: 34.1 g/dL (ref 32.0–36.0)
MCV: 79.3 fL — AB (ref 80.0–100.0)
Platelets: 199 10*3/uL (ref 150–440)
RBC: 3.92 MIL/uL (ref 3.80–5.20)
RDW: 14.4 % (ref 11.5–14.5)
WBC: 6.9 10*3/uL (ref 3.6–11.0)

## 2017-03-19 LAB — CREATININE, SERUM
Creatinine, Ser: 0.86 mg/dL (ref 0.44–1.00)
GFR calc non Af Amer: >60 mL/min (ref >60)

## 2017-03-19 LAB — HEMOGLOBIN: Hemoglobin: 9.6 g/dL — ABNORMAL LOW (ref 12.0–16.0)

## 2017-03-19 SURGERY — ARTHROPLASTY, HIP, TOTAL, ANTERIOR APPROACH
Anesthesia: Spinal | Site: Hip | Laterality: Left | Wound class: Clean

## 2017-03-19 MED ORDER — FAMOTIDINE 20 MG PO TABS
ORAL_TABLET | ORAL | Status: AC
  Administered 2017-03-19: 20 mg via ORAL
  Filled 2017-03-19: qty 1

## 2017-03-19 MED ORDER — LEVOTHYROXINE SODIUM 50 MCG PO TABS
150.0000 ug | ORAL_TABLET | Freq: Every day | ORAL | Status: DC
  Administered 2017-03-20 – 2017-03-21 (×2): 150 ug via ORAL
  Filled 2017-03-19 (×2): qty 1

## 2017-03-19 MED ORDER — PROPOFOL 500 MG/50ML IV EMUL
INTRAVENOUS | Status: AC
  Filled 2017-03-19: qty 50

## 2017-03-19 MED ORDER — BUPIVACAINE HCL (PF) 0.5 % IJ SOLN
INTRAMUSCULAR | Status: AC
  Filled 2017-03-19: qty 10

## 2017-03-19 MED ORDER — LACTATED RINGERS IV SOLN
INTRAVENOUS | Status: DC
  Administered 2017-03-19: 08:00:00 via INTRAVENOUS

## 2017-03-19 MED ORDER — METOCLOPRAMIDE HCL 10 MG PO TABS: 5.0000 mg | ORAL_TABLET | Freq: Three times a day (TID) | ORAL | Status: DC | PRN

## 2017-03-19 MED ORDER — MONTELUKAST SODIUM 10 MG PO TABS
10.0000 mg | ORAL_TABLET | Freq: Every morning | ORAL | Status: DC
  Administered 2017-03-20 – 2017-03-21 (×2): 10 mg via ORAL
  Filled 2017-03-19 (×2): qty 1

## 2017-03-19 MED ORDER — FUROSEMIDE 20 MG PO TABS
20.0000 mg | ORAL_TABLET | Freq: Every day | ORAL | Status: DC
  Administered 2017-03-20 – 2017-03-21 (×2): 20 mg via ORAL
  Filled 2017-03-19 (×2): qty 1

## 2017-03-19 MED ORDER — CYANOCOBALAMIN 500 MCG PO TABS
500.0000 ug | ORAL_TABLET | Freq: Every day | ORAL | Status: DC
  Administered 2017-03-20 – 2017-03-21 (×2): 500 ug via ORAL
  Filled 2017-03-19 (×3): qty 1

## 2017-03-19 MED ORDER — FENTANYL CITRATE (PF) 100 MCG/2ML IJ SOLN
INTRAMUSCULAR | Status: AC
  Filled 2017-03-19: qty 2

## 2017-03-19 MED ORDER — ALBUMIN HUMAN 5 % IV SOLN
INTRAVENOUS | Status: DC | PRN
  Administered 2017-03-19: 11:00:00 via INTRAVENOUS

## 2017-03-19 MED ORDER — METOCLOPRAMIDE HCL 5 MG/ML IJ SOLN: 5.0000 mg | Freq: Three times a day (TID) | INTRAMUSCULAR | Status: DC | PRN

## 2017-03-19 MED ORDER — DOCUSATE SODIUM 100 MG PO CAPS
100.0000 mg | ORAL_CAPSULE | Freq: Two times a day (BID) | ORAL | Status: DC
  Administered 2017-03-19 – 2017-03-21 (×4): 100 mg via ORAL
  Filled 2017-03-19 (×4): qty 1

## 2017-03-19 MED ORDER — BUPIVACAINE HCL (PF) 0.5 % IJ SOLN
INTRAMUSCULAR | Status: DC | PRN
  Administered 2017-03-19: 3 mL

## 2017-03-19 MED ORDER — BUTALBITAL-APAP-CAFFEINE 50-325-40 MG PO TABS
1.0000 | ORAL_TABLET | Freq: Every evening | ORAL | Status: DC | PRN
  Filled 2017-03-19: qty 1

## 2017-03-19 MED ORDER — ONDANSETRON HCL 4 MG PO TABS: 4.0000 mg | ORAL_TABLET | Freq: Three times a day (TID) | ORAL | Status: DC | PRN

## 2017-03-19 MED ORDER — LORATADINE 10 MG PO TABS
10.0000 mg | ORAL_TABLET | Freq: Every day | ORAL | Status: DC
Start: 2017-03-19 — End: 2017-03-21
  Administered 2017-03-20 – 2017-03-21 (×2): 10 mg via ORAL
  Filled 2017-03-19 (×2): qty 1

## 2017-03-19 MED ORDER — ENOXAPARIN SODIUM 40 MG/0.4ML ~~LOC~~ SOLN
40.0000 mg | SUBCUTANEOUS | Status: DC
  Administered 2017-03-20 – 2017-03-21 (×2): 40 mg via SUBCUTANEOUS
  Filled 2017-03-19 (×2): qty 0.4

## 2017-03-19 MED ORDER — CITALOPRAM HYDROBROMIDE 20 MG PO TABS
40.0000 mg | ORAL_TABLET | Freq: Every day | ORAL | Status: DC
  Administered 2017-03-20 – 2017-03-21 (×2): 40 mg via ORAL
  Filled 2017-03-19 (×2): qty 2

## 2017-03-19 MED ORDER — FENTANYL CITRATE (PF) 100 MCG/2ML IJ SOLN
INTRAMUSCULAR | Status: DC | PRN
Start: 2017-03-19 — End: 2017-03-19
  Administered 2017-03-19 (×2): 50 ug via INTRAVENOUS

## 2017-03-19 MED ORDER — CLINDAMYCIN PHOSPHATE 900 MG/50ML IV SOLN
900.0000 mg | Freq: Four times a day (QID) | INTRAVENOUS | Status: AC
  Administered 2017-03-19 – 2017-03-20 (×3): 900 mg via INTRAVENOUS
  Filled 2017-03-19 (×4): qty 50

## 2017-03-19 MED ORDER — CARISOPRODOL 350 MG PO TABS
350.0000 mg | ORAL_TABLET | Freq: Every day | ORAL | Status: DC
  Administered 2017-03-19 – 2017-03-20 (×2): 350 mg via ORAL
  Filled 2017-03-19 (×2): qty 1

## 2017-03-19 MED ORDER — NEOMYCIN-POLYMYXIN B GU 40-200000 IR SOLN
Status: DC | PRN
  Administered 2017-03-19: 4 mL

## 2017-03-19 MED ORDER — TRAZODONE HCL 50 MG PO TABS
25.0000 mg | ORAL_TABLET | Freq: Every day | ORAL | Status: DC
  Administered 2017-03-19 – 2017-03-20 (×2): 50 mg via ORAL
  Filled 2017-03-19 (×2): qty 1

## 2017-03-19 MED ORDER — VANCOMYCIN HCL IN DEXTROSE 1-5 GM/200ML-% IV SOLN
INTRAVENOUS | Status: AC
  Administered 2017-03-19: 1000 mg via INTRAVENOUS
  Filled 2017-03-19: qty 200

## 2017-03-19 MED ORDER — FENTANYL CITRATE (PF) 100 MCG/2ML IJ SOLN
25.0000 ug | INTRAMUSCULAR | Status: DC | PRN
Start: 2017-03-19 — End: 2017-03-19

## 2017-03-19 MED ORDER — VANCOMYCIN HCL IN DEXTROSE 1-5 GM/200ML-% IV SOLN
1000.0000 mg | Freq: Once | INTRAVENOUS | Status: AC
  Administered 2017-03-19: 1000 mg via INTRAVENOUS

## 2017-03-19 MED ORDER — MENTHOL 3 MG MT LOZG
1.0000 | LOZENGE | OROMUCOSAL | Status: DC | PRN
  Filled 2017-03-19: qty 9

## 2017-03-19 MED ORDER — ASPIRIN 81 MG PO CHEW
81.0000 mg | CHEWABLE_TABLET | Freq: Every day | ORAL | Status: DC
  Administered 2017-03-20 – 2017-03-21 (×2): 81 mg via ORAL
  Filled 2017-03-19 (×2): qty 1

## 2017-03-19 MED ORDER — CLINDAMYCIN PHOSPHATE 900 MG/50ML IV SOLN
INTRAVENOUS | Status: AC
  Filled 2017-03-19: qty 50

## 2017-03-19 MED ORDER — MAGNESIUM HYDROXIDE 400 MG/5ML PO SUSP
30.0000 mL | Freq: Every day | ORAL | Status: DC | PRN
Start: 2017-03-19 — End: 2017-03-20

## 2017-03-19 MED ORDER — BISACODYL 10 MG RE SUPP: 10.0000 mg | Freq: Every day | RECTAL | Status: DC | PRN

## 2017-03-19 MED ORDER — NEOMYCIN-POLYMYXIN B GU 40-200000 IR SOLN
Status: AC
  Filled 2017-03-19: qty 4

## 2017-03-19 MED ORDER — HYDROMORPHONE HCL 2 MG PO TABS
2.0000 mg | ORAL_TABLET | Freq: Four times a day (QID) | ORAL | Status: DC | PRN
  Administered 2017-03-19 – 2017-03-20 (×3): 2 mg via ORAL
  Filled 2017-03-19 (×3): qty 1

## 2017-03-19 MED ORDER — ALBUTEROL SULFATE HFA 108 (90 BASE) MCG/ACT IN AERS: 2.0000 | INHALATION_SPRAY | Freq: Four times a day (QID) | RESPIRATORY_TRACT | Status: DC | PRN

## 2017-03-19 MED ORDER — FAMOTIDINE 20 MG PO TABS
20.0000 mg | ORAL_TABLET | Freq: Once | ORAL | Status: AC
  Administered 2017-03-19: 20 mg via ORAL

## 2017-03-19 MED ORDER — TEMAZEPAM 15 MG PO CAPS: 15.0000 mg | ORAL_CAPSULE | Freq: Every evening | ORAL | Status: DC | PRN

## 2017-03-19 MED ORDER — MIDAZOLAM HCL 2 MG/2ML IJ SOLN
INTRAMUSCULAR | Status: AC
  Filled 2017-03-19: qty 2

## 2017-03-19 MED ORDER — BUPIVACAINE-EPINEPHRINE 0.25% -1:200000 IJ SOLN
INTRAMUSCULAR | Status: DC | PRN
  Administered 2017-03-19: 30 mL

## 2017-03-19 MED ORDER — ACETAMINOPHEN 325 MG PO TABS
650.0000 mg | ORAL_TABLET | Freq: Four times a day (QID) | ORAL | Status: DC | PRN
  Administered 2017-03-19 – 2017-03-21 (×7): 650 mg via ORAL
  Filled 2017-03-19 (×7): qty 2

## 2017-03-19 MED ORDER — PHENOL 1.4 % MT LIQD
1.0000 | OROMUCOSAL | Status: DC | PRN
  Filled 2017-03-19: qty 177

## 2017-03-19 MED ORDER — ALUM & MAG HYDROXIDE-SIMETH 200-200-20 MG/5ML PO SUSP: 30.0000 mL | ORAL | Status: DC | PRN

## 2017-03-19 MED ORDER — VITAMIN D 1000 UNITS PO TABS
2000.0000 [IU] | ORAL_TABLET | Freq: Every day | ORAL | Status: DC
  Administered 2017-03-20 – 2017-03-21 (×2): 2000 [IU] via ORAL
  Filled 2017-03-19 (×2): qty 2

## 2017-03-19 MED ORDER — PROPOFOL 500 MG/50ML IV EMUL
INTRAVENOUS | Status: DC | PRN
  Administered 2017-03-19: 75 ug/kg/min via INTRAVENOUS

## 2017-03-19 MED ORDER — DIPHENHYDRAMINE HCL 12.5 MG/5ML PO ELIX: 12.5000 mg | ORAL_SOLUTION | ORAL | Status: DC | PRN

## 2017-03-19 MED ORDER — ONDANSETRON HCL 4 MG PO TABS: 4.0000 mg | ORAL_TABLET | Freq: Four times a day (QID) | ORAL | Status: DC | PRN

## 2017-03-19 MED ORDER — CHLORHEXIDINE GLUCONATE CLOTH 2 % EX PADS
6.0000 | MEDICATED_PAD | Freq: Every day | CUTANEOUS | Status: DC
  Administered 2017-03-20 – 2017-03-21 (×2): 6 via TOPICAL

## 2017-03-19 MED ORDER — MIDAZOLAM HCL 5 MG/5ML IJ SOLN
INTRAMUSCULAR | Status: DC | PRN
  Administered 2017-03-19: 2 mg via INTRAVENOUS

## 2017-03-19 MED ORDER — MAGNESIUM CITRATE PO SOLN
1.0000 | Freq: Once | ORAL | Status: DC | PRN
  Filled 2017-03-19: qty 296

## 2017-03-19 MED ORDER — ACETAMINOPHEN 650 MG RE SUPP: 650.0000 mg | Freq: Four times a day (QID) | RECTAL | Status: DC | PRN

## 2017-03-19 MED ORDER — FLUTICASONE FUROATE-VILANTEROL 100-25 MCG/INH IN AEPB
1.0000 | INHALATION_SPRAY | Freq: Every day | RESPIRATORY_TRACT | Status: DC
  Administered 2017-03-20 – 2017-03-21 (×2): 1 via RESPIRATORY_TRACT
  Filled 2017-03-19: qty 28

## 2017-03-19 MED ORDER — BENAZEPRIL HCL 10 MG PO TABS
10.0000 mg | ORAL_TABLET | Freq: Every day | ORAL | Status: DC
  Filled 2017-03-19 (×3): qty 1

## 2017-03-19 MED ORDER — GABAPENTIN 300 MG PO CAPS
300.0000 mg | ORAL_CAPSULE | ORAL | Status: DC
  Administered 2017-03-20: 300 mg via ORAL
  Filled 2017-03-19: qty 1

## 2017-03-19 MED ORDER — BUPIVACAINE-EPINEPHRINE (PF) 0.25% -1:200000 IJ SOLN
INTRAMUSCULAR | Status: AC
  Filled 2017-03-19: qty 30

## 2017-03-19 MED ORDER — METOPROLOL TARTRATE 25 MG PO TABS
25.0000 mg | ORAL_TABLET | Freq: Two times a day (BID) | ORAL | Status: DC
  Administered 2017-03-21: 25 mg via ORAL
  Filled 2017-03-19 (×2): qty 1

## 2017-03-19 MED ORDER — EPHEDRINE SULFATE 50 MG/ML IJ SOLN
INTRAMUSCULAR | Status: DC | PRN
  Administered 2017-03-19 (×2): 5 mg via INTRAVENOUS
  Administered 2017-03-19: 10 mg via INTRAVENOUS
  Administered 2017-03-19 (×5): 5 mg via INTRAVENOUS

## 2017-03-19 MED ORDER — MUPIROCIN 2 % EX OINT
1.0000 "application " | TOPICAL_OINTMENT | Freq: Two times a day (BID) | CUTANEOUS | Status: DC
  Administered 2017-03-19 – 2017-03-21 (×4): 1 via NASAL
  Filled 2017-03-19: qty 22

## 2017-03-19 MED ORDER — ALBUTEROL SULFATE (2.5 MG/3ML) 0.083% IN NEBU: 2.5000 mg | INHALATION_SOLUTION | Freq: Four times a day (QID) | RESPIRATORY_TRACT | Status: DC | PRN

## 2017-03-19 MED ORDER — ONDANSETRON HCL 4 MG/2ML IJ SOLN
4.0000 mg | Freq: Four times a day (QID) | INTRAMUSCULAR | Status: DC | PRN
  Administered 2017-03-19 – 2017-03-20 (×4): 4 mg via INTRAVENOUS
  Filled 2017-03-19 (×4): qty 2

## 2017-03-19 MED ORDER — HYDROCODONE-ACETAMINOPHEN 10-325 MG PO TABS: 1.0000 | ORAL_TABLET | ORAL | Status: DC | PRN

## 2017-03-19 MED ORDER — PROPOFOL 10 MG/ML IV BOLUS
INTRAVENOUS | Status: AC
  Filled 2017-03-19: qty 20

## 2017-03-19 MED ORDER — SODIUM CHLORIDE 0.9 % IV SOLN
INTRAVENOUS | Status: DC | PRN
  Administered 2017-03-19: 35 ug/min via INTRAVENOUS

## 2017-03-19 MED ORDER — PHENYLEPHRINE HCL 10 MG/ML IJ SOLN
INTRAMUSCULAR | Status: DC | PRN
  Administered 2017-03-19 (×4): 100 ug via INTRAVENOUS

## 2017-03-19 MED ORDER — ONDANSETRON HCL 4 MG/2ML IJ SOLN: 4.0000 mg | Freq: Once | INTRAMUSCULAR | Status: DC | PRN

## 2017-03-19 MED ORDER — SODIUM CHLORIDE 0.9 % IV SOLN
INTRAVENOUS | Status: DC
  Administered 2017-03-19: 14:00:00 via INTRAVENOUS

## 2017-03-19 MED ORDER — ALBUMIN HUMAN 5 % IV SOLN
INTRAVENOUS | Status: AC
  Filled 2017-03-19: qty 250

## 2017-03-19 MED ORDER — FE FUMARATE-B12-VIT C-FA-IFC PO CAPS
1.0000 | ORAL_CAPSULE | Freq: Two times a day (BID) | ORAL | Status: DC
  Administered 2017-03-19 – 2017-03-21 (×4): 1 via ORAL
  Filled 2017-03-19 (×4): qty 1

## 2017-03-19 MED ORDER — HYDROMORPHONE HCL 1 MG/ML IJ SOLN
1.0000 mg | INTRAMUSCULAR | Status: DC | PRN
  Administered 2017-03-19: 1 mg via INTRAVENOUS
  Filled 2017-03-19: qty 1

## 2017-03-19 SURGICAL SUPPLY — 51 items
BLADE SAW SAG 18.5X105 (BLADE) ×2 IMPLANT
BNDG COHESIVE 6X5 TAN STRL LF (GAUZE/BANDAGES/DRESSINGS) ×6 IMPLANT
CANISTER SUCT 1200ML W/VALVE (MISCELLANEOUS) ×2 IMPLANT
CAPT HIP TOTAL 3 ×2 IMPLANT
CATH TRAY METER 16FR LF (MISCELLANEOUS) ×2 IMPLANT
CHLORAPREP W/TINT 26ML (MISCELLANEOUS) ×2 IMPLANT
DRAPE C-ARM XRAY 36X54 (DRAPES) ×2 IMPLANT
DRAPE INCISE IOBAN 66X60 STRL (DRAPES) IMPLANT
DRAPE POUCH INSTRU U-SHP 10X18 (DRAPES) ×2 IMPLANT
DRAPE SHEET LG 3/4 BI-LAMINATE (DRAPES) ×6 IMPLANT
DRAPE TABLE BACK 80X90 (DRAPES) ×2 IMPLANT
DRESSING SURGICEL FIBRLLR 1X2 (HEMOSTASIS) ×2 IMPLANT
DRSG OPSITE POSTOP 4X8 (GAUZE/BANDAGES/DRESSINGS) ×4 IMPLANT
DRSG SURGICEL FIBRILLAR 1X2 (HEMOSTASIS) ×4
ELECT BLADE 6.5 EXT (BLADE) ×2 IMPLANT
ELECT REM PT RETURN 9FT ADLT (ELECTROSURGICAL) ×2
ELECTRODE REM PT RTRN 9FT ADLT (ELECTROSURGICAL) ×1 IMPLANT
EVACUATOR 1/8 PVC DRAIN (DRAIN) IMPLANT
GLOVE BIOGEL PI IND STRL 9 (GLOVE) ×1 IMPLANT
GLOVE BIOGEL PI INDICATOR 9 (GLOVE) ×1
GLOVE SURG SYN 9.0  PF PI (GLOVE) ×2
GLOVE SURG SYN 9.0 PF PI (GLOVE) ×2 IMPLANT
GOWN SRG 2XL LVL 4 RGLN SLV (GOWNS) ×1 IMPLANT
GOWN STRL NON-REIN 2XL LVL4 (GOWNS) ×1
GOWN STRL REUS W/ TWL LRG LVL3 (GOWN DISPOSABLE) ×1 IMPLANT
GOWN STRL REUS W/TWL LRG LVL3 (GOWN DISPOSABLE) ×1
HOLDER FOLEY CATH W/STRAP (MISCELLANEOUS) ×2 IMPLANT
HOOD PEEL AWAY FLYTE STAYCOOL (MISCELLANEOUS) ×2 IMPLANT
KIT PREVENA INCISION MGT 13 (CANNISTER) ×2 IMPLANT
MAT BLUE FLOOR 46X72 FLO (MISCELLANEOUS) ×2 IMPLANT
NDL SAFETY 18GX1.5 (NEEDLE) ×2 IMPLANT
NEEDLE SPNL 18GX3.5 QUINCKE PK (NEEDLE) ×2 IMPLANT
NS IRRIG 1000ML POUR BTL (IV SOLUTION) ×2 IMPLANT
PACK HIP COMPR (MISCELLANEOUS) ×2 IMPLANT
SOL PREP PVP 2OZ (MISCELLANEOUS) ×2
SOLUTION PREP PVP 2OZ (MISCELLANEOUS) ×1 IMPLANT
SPONGE DRAIN TRACH 4X4 STRL 2S (GAUZE/BANDAGES/DRESSINGS) ×2 IMPLANT
STAPLER SKIN PROX 35W (STAPLE) ×2 IMPLANT
STRAP SAFETY BODY (MISCELLANEOUS) ×2 IMPLANT
SUT BONE WAX W31G (SUTURE) ×2 IMPLANT
SUT DVC 2 QUILL PDO  T11 36X36 (SUTURE) ×1
SUT DVC 2 QUILL PDO T11 36X36 (SUTURE) ×1 IMPLANT
SUT SILK 0 (SUTURE) ×1
SUT SILK 0 30XBRD TIE 6 (SUTURE) ×1 IMPLANT
SUT V-LOC 90 ABS DVC 3-0 CL (SUTURE) ×2 IMPLANT
SUT VIC AB 1 CT1 36 (SUTURE) ×2 IMPLANT
SYR 20CC LL (SYRINGE) ×2 IMPLANT
SYR 30ML LL (SYRINGE) ×2 IMPLANT
TAPE MICROFOAM 4IN (TAPE) ×2 IMPLANT
TOWEL OR 17X26 4PK STRL BLUE (TOWEL DISPOSABLE) ×2 IMPLANT
WND VAC CANISTER 500ML (MISCELLANEOUS) ×2 IMPLANT

## 2017-03-19 NOTE — Anesthesia Preprocedure Evaluation (Signed)
Anesthesia Evaluation  Patient identified by MRN, date of birth, ID band Patient awake    Reviewed: Allergy & Precautions, NPO status , Patient's Chart, lab work & pertinent test results, reviewed documented beta blocker date and time   History of Anesthesia Complications (+) PONV  Airway Mallampati: III       Dental   Pulmonary asthma , former smoker,           Cardiovascular hypertension, Pt. on medications and Pt. on home beta blockers + Peripheral Vascular Disease       Neuro/Psych Anxiety Depression    GI/Hepatic Neg liver ROS, neg GERD  ,  Endo/Other  neg diabetesHypothyroidism   Renal/GU Renal disease (stones)     Musculoskeletal   Abdominal   Peds  Hematology   Anesthesia Other Findings   Reproductive/Obstetrics                            Anesthesia Physical Anesthesia Plan  ASA: III  Anesthesia Plan: Spinal   Post-op Pain Management:    Induction:   PONV Risk Score and Plan:   Airway Management Planned:   Additional Equipment:   Intra-op Plan:   Post-operative Plan:   Informed Consent: I have reviewed the patients History and Physical, chart, labs and discussed the procedure including the risks, benefits and alternatives for the proposed anesthesia with the patient or authorized representative who has indicated his/her understanding and acceptance.     Plan Discussed with:   Anesthesia Plan Comments:         Anesthesia Quick Evaluation

## 2017-03-19 NOTE — Anesthesia Post-op Follow-up Note (Signed)
Anesthesia QCDR form completed.        

## 2017-03-19 NOTE — NC FL2 (Signed)
[place] LEVEL OF CARE SCREENING TOOL     IDENTIFICATION  Patient Name: DONELLE HISE Birthdate: [birth year] Sex: female Admission Date (Current Location): 03/19/2017  [place] and [place] Number:  Engineering geologist and Address:  [facility], [address] [place], [place] [address]      Provider Number: [id number]  Attending Physician Name and Address:  Hessie Knows, MD  Relative Name and Phone Number:       Current Level of Care: Hospital Recommended Level of Care: [place] Prior Approval Number:    Date Approved/Denied:   PASRR Number:   [id number]  Discharge Plan: SNF    Current Diagnoses: Patient Active Problem List   Diagnosis Date Noted  . Primary localized osteoarthritis of left hip 03/19/2017  . Pain in limb 10/13/2016  . Pre-op exam 08/28/2016  . History of DVT (deep vein thrombosis) 08/25/2016  . PAD (peripheral artery disease) ([place]) 08/25/2016  . Chronic venous insufficiency 08/25/2016  . Iron deficiency anemia 03/25/2016  . Primary osteoarthritis of right hip 11/06/2015  . History of kidney stones 09/03/2015  . Osteoarthritis   . Fibromyalgia 02/14/2014  . Atrial tachycardia ([place]) 01/25/2013  . Hypothyroidism, postsurgical 02/25/2012  . Edema 01/28/2012  . Headache(784.0) 09/25/2011  . Bronchial asthma   . Healthcare maintenance 03/18/2011  . Hot flashes 03/18/2011  . Nasal septal perforation   . Horseshoe kidney   . HTN (hypertension)   . Depression   . Lupus anticoagulant disorder ([place])   . 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    Orientation RESPIRATION BLADDER Height & Weight     Self, Time, Situation, Place  Normal Continent Weight: 237 lb (107.5 kg) Height:  5\' 9"  (175.3 cm)  BEHAVIORAL SYMPTOMS/MOOD NEUROLOGICAL BOWEL NUTRITION STATUS      Continent Diet (Clear liquid to be advanced)  AMBULATORY STATUS COMMUNICATION OF NEEDS Skin   Extensive Assist Verbally Surgical wounds  (Incision: Left Hip)                       Personal Care Assistance Level of Assistance  Bathing, Feeding, Dressing Bathing Assistance: Limited assistance Feeding assistance: Independent Dressing Assistance: Limited assistance     Functional Limitations Info  Sight, Hearing, Speech Sight Info: Adequate Hearing Info: Adequate Speech Info: Adequate    SPECIAL CARE FACTORS FREQUENCY  PT (By licensed PT), OT (By licensed OT)     PT Frequency:  (5) OT Frequency:  (5)            Contractures      Additional Factors Info  Code Status, Allergies Code Status Info:  (Full Code) Allergies Info:  (SULFA ANTIBIOTICS, CEFUROXIME AXETIL, NUTRITIONAL SUPPLEMENTS, OTHER, OXYCODONE-ACETAMINOPHEN, BETADINE POVIDONE IODINE )           Current Medications (03/19/2017):  This is the current hospital active medication list Current Facility-Administered Medications  Medication Dose Route Frequency Provider Last Rate Last Dose  . 0.9 %  sodium chloride infusion   Intravenous Continuous Hessie Knows, MD 100 mL/hr at 03/19/17 1401    . acetaminophen (TYLENOL) tablet 650 mg  650 mg Oral Q6H PRN Hessie Knows, MD       Or  . acetaminophen (TYLENOL) suppository 650 mg  650 mg Rectal Q6H PRN Hessie Knows, MD      . albuterol (PROVENTIL) (2.5 MG/3ML) 0.083% nebulizer solution 2.5 mg  2.5 mg Nebulization Q6H PRN Hessie Knows, MD      . alum & Iris Pert  hydroxide-simeth (MAALOX/MYLANTA) 200-200-20 MG/5ML suspension 30 mL  30 mL Oral Q4H PRN Kennedy Bucker, MD      . aspirin chewable tablet 81 mg  81 mg Oral Daily Kennedy Bucker, MD      . benazepril (LOTENSIN) tablet 10 mg  10 mg Oral Q breakfast Kennedy Bucker, MD      . bisacodyl (DULCOLAX) suppository 10 mg  10 mg Rectal Daily PRN Kennedy Bucker, MD      . butalbital-acetaminophen-caffeine (FIORICET, ESGIC) [phone] MG per tablet 1 tablet  1 tablet Oral QHS PRN Kennedy Bucker, MD      . carisoprodol (SOMA) tablet 350 mg  350 mg Oral QHS Kennedy Bucker, MD      . cholecalciferol (VITAMIN D) tablet 2,000 Units  2,000 Units Oral Daily Kennedy Bucker, MD      . Melene Muller ON 03/20/2017] citalopram (CELEXA) tablet 40 mg  40 mg Oral Daily Kennedy Bucker, MD      . clindamycin (CLEOCIN) IVPB 900 mg  900 mg Intravenous Q6H Kennedy Bucker, MD      . cyanocobalamin tablet 500 mcg  500 mcg Oral Daily Kennedy Bucker, MD      . diphenhydrAMINE (BENADRYL) 12.5 MG/5ML elixir 12.5-25 mg  12.5-25 mg Oral Q4H PRN Kennedy Bucker, MD      . docusate sodium (COLACE) capsule 100 mg  100 mg Oral BID Kennedy Bucker, MD      . Melene Muller ON 03/20/2017] enoxaparin (LOVENOX) injection 40 mg  40 mg Subcutaneous Q24H Kennedy Bucker, MD      . ferrous fumarate-b12-vitamic C-folic acid (TRINSICON / FOLTRIN) capsule 1 capsule  1 capsule Oral BID PC Kennedy Bucker, MD      . Melene Muller ON 03/20/2017] fluticasone furoate-vilanterol (BREO ELLIPTA) 100-25 MCG/INH 1 puff  1 puff Inhalation Daily Kennedy Bucker, MD      . furosemide (LASIX) tablet 20 mg  20 mg Oral Daily Kennedy Bucker, MD      . Melene Muller ON 03/20/2017] gabapentin (NEURONTIN) capsule 300 mg  300 mg Oral Once per day on Mon Wed Fri Menz, Michael, MD      . HYDROcodone-acetaminophen [facility]) 10-325 MG per tablet 1-2 tablet  1-2 tablet Oral Q4H PRN Kennedy Bucker, MD      . HYDROmorphone (DILAUDID) injection 1 mg  1 mg Intravenous Q2H PRN Kennedy Bucker, MD      . HYDROmorphone (DILAUDID) tablet 2 mg  2 mg Oral Q6H PRN Kennedy Bucker, MD      . Melene Muller ON 03/20/2017] levothyroxine (SYNTHROID, LEVOTHROID) tablet 150 mcg  150 mcg Oral QAC breakfast Kennedy Bucker, MD      . loratadine (CLARITIN) tablet 10 mg  10 mg Oral Daily Kennedy Bucker, MD      . magnesium citrate solution 1 Bottle  1 Bottle Oral Once PRN Kennedy Bucker, MD      . magnesium hydroxide (MILK OF MAGNESIA) suspension 30 mL  30 mL Oral Daily PRN Kennedy Bucker, MD      . menthol-cetylpyridinium (CEPACOL) lozenge 3 mg  1 lozenge Oral PRN Kennedy Bucker, MD       Or  . phenol  (CHLORASEPTIC) mouth spray 1 spray  1 spray Mouth/Throat PRN Kennedy Bucker, MD      . metoCLOPramide (REGLAN) tablet 5-10 mg  5-10 mg Oral Q8H PRN Kennedy Bucker, MD       Or  . metoCLOPramide (REGLAN) injection 5-10 mg  5-10 mg Intravenous Q8H PRN Kennedy Bucker, MD      .  metoprolol tartrate (LOPRESSOR) tablet 25 mg  25 mg Oral BID Kennedy Bucker, MD      . montelukast (SINGULAIR) tablet 10 mg  10 mg Oral q morning - 10a Kennedy Bucker, MD      . ondansetron [facility]) tablet 4 mg  4 mg Oral Q6H PRN Kennedy Bucker, MD       Or  . ondansetron [facility]) injection 4 mg  4 mg Intravenous Q6H PRN Kennedy Bucker, MD      . temazepam (RESTORIL) capsule 15 mg  15 mg Oral QHS PRN,MR X 1 Kennedy Bucker, MD      . traZODone (DESYREL) tablet 25-50 mg  25-50 mg Oral QHS Kennedy Bucker, MD         Discharge Medications: Please see discharge summary for a list of discharge medications.  Relevant Imaging Results:  Relevant Lab Results:   Additional Information  (SSN: [id number])  Payton Spark, Student-Social Work

## 2017-03-19 NOTE — Progress Notes (Signed)
Reported SBP in 80s to Dr. Henrene Hawking.  A&O, Talking, no dizzyness, pain or SOB, HR in High 50s and 60s.  No new orders at this time will await results of HGB.

## 2017-03-19 NOTE — Op Note (Signed)
03/19/2017  11:52 AM  PATIENT:  Claire Strong  56 y.o. female  PRE-OPERATIVE DIAGNOSIS:  PRIMARY OSTEOARTHRITIS OF LEFT HIP  POST-OPERATIVE DIAGNOSIS:  PRIMARY OSTEOARTHRITIS OF LEFT HIP  PROCEDURE:  Procedure(s): TOTAL HIP ARTHROPLASTY ANTERIOR APPROACH (Left)  SURGEON: Leitha Schuller, MD  ASSISTANTS: None  ANESTHESIA:   spinal  EBL:  Total I/O In: 800 [I.V.:800] Out: 825 [Urine:125; Blood:700]  BLOOD ADMINISTERED:none  DRAINS: (2) Hemovact drain(s) in the Subcutaneous layer with  Suction Open   LOCAL MEDICATIONS USED:  MARCAINE     SPECIMEN:  Source of Specimen:  Left femoral head  DISPOSITION OF SPECIMEN:  PATHOLOGY  COUNTS:  YES  TOURNIQUET:  * No tourniquets in log *  IMPLANTS: Medacta Amis 3 standard stem with ceramic 28 mm S head, 52 mm Mpact DM cup and liner  DICTATION: .Dragon Dictation   The patient was brought to the operating room and after spinal anesthesia was obtained patient was placed on the operative table with the ipsilateral foot into the Medacta attachment, contralateral leg on a well-padded table. C-arm was brought in and preop template x-ray taken. After prepping and draping in usual sterile fashion appropriate patient identification and timeout procedures were completed. Anterior approach to the hip was obtained and centered over the greater trochanter and TFL muscle. The subcutaneous tissue was incised hemostasis being achieved by electrocautery. TFL fascia was incised and the muscle retracted laterally deep retractor placed. The lateral femoral circumflex vessels were identified and ligated. The anterior capsule was exposed and a capsulotomy performed. The neck was identified and a femoral neck cut carried out with a saw. The head was removed without difficulty and showed sclerotic femoral head and acetabulum. Reaming was carried out to 52 mm and a 52 mm cup trial gave appropriate tightness to the acetabular component a 52 DM cup was impacted into  position. The leg was then externally rotated and ischiofemoral and pubofemoral releases carried out. The femur was sequentially broached to a size 3, size 3 standard width S head trials were placed and the final components chosen. The 3 standard stem was inserted along with a ceramic S 28 mm head and 52 mm liner. The hip was reduced and was stable the wound was thoroughly irrigated with fibrillar placed on the posterior capsule and medial neck to minimize postoperative bleeding. The deep fascia view. Using a heavy Quill after infiltration of 30 cc of quarter percent Sensorcaine with epinephrine. Subcutaneous drains were then inserted, 3-0 v-loc to close the skin with skin staples and incisional wound VAC applied   PLAN OF CARE: Admit to inpatient

## 2017-03-19 NOTE — Anesthesia Procedure Notes (Signed)
Spinal  Patient location during procedure: OR Staffing Performed: anesthesiologist  Preanesthetic Checklist Completed: patient identified, site marked, surgical consent, pre-op evaluation, timeout performed, IV checked, risks and benefits discussed and monitors and equipment checked Spinal Block Patient position: sitting Prep: Betadine Patient monitoring: heart rate, continuous pulse ox, blood pressure and cardiac monitor Approach: midline Location: L3-4 Injection technique: single-shot Needle Needle type: Whitacre and Introducer  Needle gauge: 25 G Needle length: 5 cm Additional Notes Negative paresthesia. Negative blood return. Positive free-flowing CSF. Expiration date of kit checked and confirmed. Patient tolerated procedure well, without complications.

## 2017-03-19 NOTE — Anesthesia Procedure Notes (Signed)
Performed by: Malva Cogan Pre-anesthesia Checklist: Patient identified, Emergency Drugs available, Suction available, Patient being monitored and Timeout performed Oxygen Delivery Method: Simple face mask

## 2017-03-19 NOTE — Progress Notes (Signed)
Took over care of patient at [facility]. Patient alert and oriented X4. Resting in bed. Pt requested tylenol for pain. Pt stated 2mg  Dilaudid had little effect. Complains of pain where wound vac is placed. IV infusing. No other complaints at this time. Will continue to monitor.

## 2017-03-19 NOTE — H&P (Signed)
Reviewed paper H+P, will be scanned into chart. No changes noted.  

## 2017-03-19 NOTE — Transfer of Care (Signed)
Immediate Anesthesia Transfer of Care Note  Patient: Claire Strong  Procedure(s) Performed: TOTAL HIP ARTHROPLASTY ANTERIOR APPROACH (Left Hip)  Patient Location: PACU  Anesthesia Type:Spinal  Level of Consciousness: awake and patient cooperative  Airway & Oxygen Therapy: Patient Spontanous Breathing  Post-op Assessment: Report given to RN and Post -op Vital signs reviewed and stable  Post vital signs: Reviewed and stable  Last Vitals:  Vitals:   03/19/17 0755  BP: 117/79  Pulse: 65  Resp: 17  Temp: (!) 35.8 C  SpO2: 100%    Last Pain:  Vitals:   03/19/17 0755  TempSrc: Tympanic  PainSc: 10-Worst pain ever         Complications: No apparent anesthesia complications

## 2017-03-19 NOTE — Progress Notes (Signed)
ADMISSION NOTE:  Pt alert and oriented X4, no complaints of pain at this time. Skin assessment and navigator completed. Surgical dressing clean, dry and intact. Hemovac and wound vac in place. Pt oriented to staff and call bell. Bed in lowest position, call bell in reach and bed alarm on.

## 2017-03-19 NOTE — Evaluation (Signed)
Physical Therapy Evaluation Patient Details Name: Claire Strong MRN: [id number] DOB: [birth year] Today's Date: 03/19/2017   History of Present Illness  Pt is a 56 y/o F who presents s/p L THA.  Pt's PMH includes tachycardia, DVT, cancer.   Clinical Impression  Pt is s/p THA resulting in the deficits listed below (see PT Problem List). Mrs. Much was very pleasant and eager to work with therapy.  She completed therapeutic exercises before performing supine>sit.  Upon sitting she reported dizziness and BP taken in sitting reading 70/25.  RN notified and pt immediately returned to supine with additional BP readings of 98/62 and 99/58.  Symptoms resolved after several minutes lying supine.  See general comments below for additional details.  Currently recommend SNF at d/c but will update as appropriate as pt progresses. Pt will benefit from skilled PT to increase their independence and safety with mobility to allow discharge to the venue listed below.     Follow Up Recommendations SNF    Equipment Recommendations  None recommended by PT    Recommendations for Other Services       Precautions / Restrictions Precautions Precautions: Fall;Other (comment) Precaution Comments: Monitor BP; no hip precautions, direct anterior approach Restrictions Weight Bearing Restrictions: Yes LLE Weight Bearing: Weight bearing as tolerated      Mobility  Bed Mobility Overal bed mobility: Needs Assistance Bed Mobility: Supine to Sit;Sit to Supine     Supine to sit: Min guard;HOB elevated Sit to supine: Min guard   General bed mobility comments: Increased time and effort.  No physical assist required for supine<>sit.  Pt with orthostatic hypotension upon sitting (see general comments below for details)  Transfers                 General transfer comment: Unable to assess due to hypotension  Ambulation/Gait                Stairs            Wheelchair Mobility    Modified  Rankin (Stroke Patients Only)       Balance Overall balance assessment: Needs assistance;History of Falls Sitting-balance support: No upper extremity supported;Feet supported Sitting balance-Leahy Scale: Poor Sitting balance - Comments: Pt must have at least 1UE supported due to dizziness.                                       Pertinent Vitals/Pain Pain Assessment: Faces Faces Pain Scale: Hurts even more Pain Location: L hip Pain Descriptors / Indicators: Cramping Pain Intervention(s): Limited activity within patient's tolerance;Monitored during session;Repositioned;Patient requesting pain meds-RN notified;RN gave pain meds during session;Ice applied    Home Living Family/patient expects to be discharged to:: Private residence Living Arrangements: Spouse/significant other Available Help at Discharge: Family Type of Home: House Home Access: Stairs to enter Entrance Stairs-Rails: Lawyer of Steps: 4 Home Layout: One level Home Equipment: Environmental consultant - 2 wheels;Walker - 4 wheels;Cane - single point;Cane - quad;Bedside commode;Shower seat - built in;Grab bars - tub/shower      Prior Function Level of Independence: Independent with assistive device(s)         Comments: Pt using SPC on occasion when ambulating longer distances.  Otherwise not using AD.  Pt reports 1 fall in the past 6 months.  Pt independent with all ADLs.     Hand Dominance  Extremity/Trunk Assessment   Upper Extremity Assessment Upper Extremity Assessment: Overall WFL for tasks assessed    Lower Extremity Assessment Lower Extremity Assessment: LLE deficits/detail LLE Deficits / Details: Strength grossly 3/5 LLE: Unable to fully assess due to pain    Cervical / Trunk Assessment Cervical / Trunk Assessment: Other exceptions Cervical / Trunk Exceptions: h/o scoliosis, cannot lie flat due to pain  Communication   Communication: No difficulties  Cognition  Arousal/Alertness: Awake/alert Behavior During Therapy: WFL for tasks assessed/performed Overall Cognitive Status: Within Functional Limits for tasks assessed                                        General Comments General comments (skin integrity, edema, etc.): After pt transitioned from supine>sit she reported dizziness and cloudiness that did not worsen in nature but remained the same.  BP taken in sitting reading 70/25 and pt immediately instructed to return to supine which she was able to do with min guard assist.  Pt remained conscious the entire session and was oriented x4 throughout session.  RN notified immediately of BP.  Pt placed in supine and BP retaken with symptoms improving (BP 98/62).  Pt then placed in slight trendeleberg and BP reading 99/58 with symptoms resolved.     Exercises Total Joint Exercises Ankle Circles/Pumps: AROM;Both;10 reps;Supine Quad Sets: Strengthening;Both;10 reps;Supine Gluteal Sets: Strengthening;Both;10 reps;Supine Straight Leg Raises: Strengthening;Left;10 reps;Supine Long Arc Quad: AROM;Left;10 reps;Seated   Assessment/Plan    PT Assessment Patient needs continued PT services  PT Problem List Decreased strength;Decreased range of motion;Decreased activity tolerance;Decreased balance;Decreased mobility;Decreased knowledge of use of DME;Decreased safety awareness;Pain       PT Treatment Interventions DME instruction;Stair training;Gait training;Functional mobility training;Therapeutic activities;Therapeutic exercise;Balance training;Neuromuscular re-education;Patient/family education;Modalities;Wheelchair mobility training    PT Goals (Current goals can be found in the Care Plan section)  Acute Rehab PT Goals Patient Stated Goal: to begin walking with therapy and to go home at d/c PT Goal Formulation: With patient Time For Goal Achievement: 04/02/17 Potential to Achieve Goals: Good    Frequency BID   Barriers to discharge         Co-evaluation               AM-PAC PT "6 Clicks" Daily Activity  Outcome Measure Difficulty turning over in bed (including adjusting bedclothes, sheets and blankets)?: A Lot Difficulty moving from lying on back to sitting on the side of the bed? : A Lot Difficulty sitting down on and standing up from a chair with arms (e.g., wheelchair, bedside commode, etc,.)?: Unable Help needed moving to and from a bed to chair (including a wheelchair)?: Total Help needed walking in hospital room?: Total Help needed climbing 3-5 steps with a railing? : Total 6 Click Score: 8    End of Session   Activity Tolerance: Treatment limited secondary to medical complications (Comment) (hypotension) Patient left: in bed;with call bell/phone within reach;with bed alarm set;with family/visitor present;with SCD's reapplied;Other (comment) (pt in slight trendelenberg) Nurse Communication: Mobility status;Other (comment) (BP readings) PT Visit Diagnosis: Pain;Unsteadiness on feet (R26.81);Other abnormalities of gait and mobility (R26.89);Muscle weakness (generalized) (M62.81) Pain - Right/Left: Left Pain - part of body: Hip    Time: 2919-6968 PT Time Calculation (min) (ACUTE ONLY): 37 min   Charges:   PT Evaluation $PT Eval High Complexity: 1 High PT Treatments $Therapeutic Exercise: 8-22 mins   PT G Codes:  PT G-Codes **NOT FOR INPATIENT CLASS** Functional Assessment Tool Used: AM-PAC 6 Clicks Basic Mobility;Clinical judgement Functional Limitation: Mobility: Walking and moving around Mobility: Walking and Moving Around Current Status (N0539): At least 80 percent but less than 100 percent impaired, limited or restricted Mobility: Walking and Moving Around Goal Status [id number]): At least 20 percent but less than 40 percent impaired, limited or restricted    Encarnacion Chu PT, DPT 03/19/2017, 3:26 PM

## 2017-03-20 ENCOUNTER — Encounter: Payer: Self-pay | Admitting: Orthopedic Surgery

## 2017-03-20 LAB — CBC
HCT: 25.9 % — ABNORMAL LOW (ref 35.0–47.0)
Hemoglobin: 8.6 g/dL — ABNORMAL LOW (ref 12.0–16.0)
MCH: 26.3 pg (ref 26.0–34.0)
MCHC: 33.2 g/dL (ref 32.0–36.0)
MCV: 79.3 fL — ABNORMAL LOW (ref 80.0–100.0)
PLATELETS: 147 10*3/uL — AB (ref 150–440)
RBC: 3.27 MIL/uL — ABNORMAL LOW (ref 3.80–5.20)
RDW: 14.1 % (ref 11.5–14.5)
WBC: 4.7 10*3/uL (ref 3.6–11.0)

## 2017-03-20 LAB — BASIC METABOLIC PANEL
Anion gap: 6 (ref 5–15)
BUN: 11 mg/dL (ref 6–20)
CALCIUM: 7.5 mg/dL — AB (ref 8.9–10.3)
CO2: 24 mmol/L (ref 22–32)
CREATININE: 0.81 mg/dL (ref 0.44–1.00)
Chloride: 104 mmol/L (ref 101–111)
GFR calc non Af Amer: >60 mL/min (ref >60)
Glucose, Bld: 119 mg/dL — ABNORMAL HIGH (ref 65–99)
Potassium: 3.6 mmol/L (ref 3.5–5.1)
SODIUM: 134 mmol/L — AB (ref 135–145)

## 2017-03-20 MED ORDER — POLYETHYLENE GLYCOL 3350 17 G PO PACK: 17.0000 g | PACK | Freq: Every day | ORAL | Status: DC | PRN

## 2017-03-20 MED ORDER — FLUCONAZOLE 50 MG PO TABS
100.0000 mg | ORAL_TABLET | Freq: Every day | ORAL | Status: DC
  Administered 2017-03-20 – 2017-03-21 (×2): 100 mg via ORAL
  Filled 2017-03-20: qty 1
  Filled 2017-03-20: qty 2

## 2017-03-20 NOTE — Care Management Note (Signed)
Case Management Note  Patient Details  Name: Claire Strong MRN: [id number] Date of Birth: 01-29-1961  Subjective/Objective:  Met with patient and her spouse at bedside to discuss discharge planning. Patient states she has a walker and BSC. Per Dr.Menz she will discharge home on Eliquis. Patient states she already has this in the home. Offered choice of home health agencies. She has used [facility] in the past and would like to use them again. Referral to [facility] with [facility] for HHPT. PCP is Dr. Danise Mina. Home tomorrow.                   Action/Plan: [facility] for HHPT  Expected Discharge Date:                  Expected Discharge Plan:  [place]  In-House Referral:     Discharge planning Services  CM Consult  Post Acute Care Choice:  Home Health Choice offered to:  Patient, Spouse  DME Arranged:    DME Agency:     HH Arranged:  PT [place]:  [place]  Status of Service:  In process, will continue to follow  If discussed at Long Length of Stay Meetings, dates discussed:    Additional Comments:  SHALAN NEAULT, RN 03/20/2017, 2:16 PM

## 2017-03-20 NOTE — Progress Notes (Signed)
Clinical Social Worker (CSW) received SNF consult. PT is recommending home health. RN case manager aware of above. Please reconsult if future social work needs arise. CSW signing off.   Allia Wiltsey, LCSW [phone] 

## 2017-03-20 NOTE — Progress Notes (Signed)
Physical Therapy Treatment Patient Details Name: Claire Strong MRN: [id number] DOB: 1960/09/11 Today's Date: 03/20/2017    History of Present Illness Pt is a 56 y/o F who presents s/p L THA.  Pt's PMH includes tachycardia, DVT, cancer.     PT Comments    Marked improvement in BP control this date; able to tolerate transition to upright with vitals stable, WFL and no subjective reports of dizziness/orthostasis.  Demonstrates ability to complete sit/stand, basic transfers and gait (80') with RW, cga/close sup from therapist; good stance time/weight acceptance through L LE without overt buckling or LOB.  Additional distance limited by generalized fatigue/weakness with exertion. Given above-noted improvement in performance and ability; discharge recommendation updated to reflect home with HHPT upon discharge.  CSW/RNCM informed and aware.    Follow Up Recommendations  Home health PT     Equipment Recommendations       Recommendations for Other Services       Precautions / Restrictions Precautions Precautions: Fall;Other (comment) Precaution Comments: Monitor BP; no hip precautions, direct anterior approach Restrictions Weight Bearing Restrictions: Yes LLE Weight Bearing: Weight bearing as tolerated    Mobility  Bed Mobility               General bed mobility comments: seated in recliner beginning/end of treatment session  Transfers Overall transfer level: Needs assistance Equipment used: Rolling walker (2 wheeled) Transfers: Sit to/from Stand Sit to Stand: Min guard         General transfer comment: good hand placement, LE strength and control; unable to tolerate full postural extension due to back and L hip pain  Ambulation/Gait Ambulation/Gait assistance: Min guard Ambulation Distance (Feet): 80 Feet Assistive device: Rolling walker (2 wheeled)       General Gait Details: reciprocal stepping pattern with progressive increase in L LE step height/length  throughout distance; good L LE stance time, good control in loading phases of gait   Stairs            Wheelchair Mobility    Modified Rankin (Stroke Patients Only)       Balance Overall balance assessment: Needs assistance Sitting-balance support: No upper extremity supported;Feet supported Sitting balance-Leahy Scale: Good     Standing balance support: Bilateral upper extremity supported Standing balance-Leahy Scale: Good                              Cognition Arousal/Alertness: Awake/alert Behavior During Therapy: WFL for tasks assessed/performed Overall Cognitive Status: Within Functional Limits for tasks assessed                                        Exercises Other Exercises Other Exercises: Seated LE therex, 1x10, AROM for muscular strength/endurance; good effort, good L LE control    General Comments        Pertinent Vitals/Pain Pain Assessment: 0-10 Faces Pain Scale: Hurts little more Pain Location: L hip Pain Descriptors / Indicators: Aching;Grimacing;Guarding Pain Intervention(s): Limited activity within patient's tolerance;Monitored during session;Premedicated before session;Repositioned    Home Living                      Prior Function            PT Goals (current goals can now be found in the care plan section) Acute Rehab PT Goals Patient Stated  Goal: to begin walking with therapy and to go home at d/c PT Goal Formulation: With patient Time For Goal Achievement: 04/02/17 Potential to Achieve Goals: Good Progress towards PT goals: Progressing toward goals    Frequency    BID      PT Plan Discharge plan needs to be updated    Co-evaluation              AM-PAC PT "6 Clicks" Daily Activity  Outcome Measure  Difficulty turning over in bed (including adjusting bedclothes, sheets and blankets)?: A Little Difficulty moving from lying on back to sitting on the side of the bed? : A  Little Difficulty sitting down on and standing up from a chair with arms (e.g., wheelchair, bedside commode, etc,.)?: Unable Help needed moving to and from a bed to chair (including a wheelchair)?: A Little Help needed walking in hospital room?: A Little Help needed climbing 3-5 steps with a railing? : A Lot 6 Click Score: 15    End of Session Equipment Utilized During Treatment: Gait belt Activity Tolerance: Patient tolerated treatment well Patient left: in chair;with call bell/phone within reach;with chair alarm set Nurse Communication: Mobility status PT Visit Diagnosis: Pain;Unsteadiness on feet (R26.81);Other abnormalities of gait and mobility (R26.89);Muscle weakness (generalized) (M62.81) Pain - Right/Left: Left Pain - part of body: Hip     Time: 8468-6810 PT Time Calculation (min) (ACUTE ONLY): 19 min  Charges:  $Gait Training: 8-22 mins                    G Codes:       Adelynn Gipe H. Manson Passey, PT, DPT, NCS 03/20/17, 12:14 PM [phone]

## 2017-03-20 NOTE — Progress Notes (Signed)
Subjective: 1 Day Post-Op Procedure(s) (LRB): TOTAL HIP ARTHROPLASTY ANTERIOR APPROACH (Left) Patient reports pain as 5 on 0-10 scale.   Patient is well, and has had no acute complaints or problems. Mild hypotension. Patient asymptomatic Denies any CP, SOB, ABD pain. We will continue therapy today.    Objective: Vital signs in last 24 hours: Temp:  [96.4 F (35.8 C)-99.5 F (37.5 C)] 99.5 F (37.5 C) (10/05 0724) Pulse Rate:  [56-81] 72 (10/05 0724) Resp:  [13-22] 19 (10/05 0320) BP: (70-134)/(25-83) 105/62 (10/05 0724) SpO2:  [98 %-100 %] 100 % (10/05 0724) Weight:  [107.5 kg (237 lb)] 107.5 kg (237 lb) (10/04 1332)  Intake/Output from previous day: 10/04 0701 - 10/05 0700 In: 3698.3 [P.O.:800; I.V.:2798.3; IV Piggyback:100] Out: 1205 [Urine:375; Drains:130; Blood:700] Intake/Output this shift: No intake/output data recorded.   Recent Labs  03/19/17 1207 03/19/17 1400 03/20/17 0336  HGB 9.6* 10.6* 8.6*    Recent Labs  03/19/17 1400 03/20/17 0336  WBC 6.9 4.7  RBC 3.92 3.27*  HCT 31.1* 25.9*  PLT 199 147*    Recent Labs  03/19/17 1400 03/20/17 0336  NA  --  134*  K  --  3.6  CL  --  104  CO2  --  24  BUN  --  11  CREATININE 0.86 0.81  GLUCOSE  --  119*  CALCIUM  --  7.5*   No results for input(s): LABPT, INR in the last 72 hours.  EXAM General - Patient is Alert, Appropriate and Oriented Extremity - Neurovascular intact Sensation intact distally Intact pulses distally Dorsiflexion/Plantar flexion intact No cellulitis present Compartment soft Dressing - dressing C/D/I and no drainage, drain intact 150 output, wound vac intact with out drainage Motor Function - intact, moving foot and toes well on exam.   Past Medical History:  Diagnosis Date  . Anxiety   . Basal cell carcinoma    nose  . Bleeding disorder (HCC)   . Bronchial asthma   . Cancer (HCC)    melanoma; right knee  . 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 Trula Ore ortho)  . Chronic venous insufficiency 2007   s/p vein stripping and laser ablation  . Depression   . Diverticulosis    mild by colonoscopy  . History of DVT (deep vein thrombosis)    DVTs after 1st pregnancy, not on AC 2/2 bleeding ulcer, greenfield filter in place  . History of gastric ulcer 2009  . History of kidney stones   . Horseshoe kidney    sole, R kidney damage  . HTN (hypertension)   . Hypothyroidism   . Kidney stones    s/p renal hematoma after lithotripsy on right  . Lupus anticoagulant disorder (HCC)   . Multinodular goiter    s/p thyroidectomy  . Nasal septal perforation    chronic, ENT rec avoid antihistamine, INS  . Osteoarthritis    ?FM by rheum  . Overweight   . Post-operative nausea and vomiting   . Post-surgical hypothyroidism    for multinodular goiter  . 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 Jacqulyn Ducking  . Seasonal allergies    allergy shots, singulair  . 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 Tachycardia     Assessment/Plan:   1 Day Post-Op Procedure(s) (LRB): TOTAL HIP ARTHROPLASTY ANTERIOR APPROACH (Left) Active Problems:   Primary localized osteoarthritis of left hip   Acute post op blood loss anemia  Estimated body mass index is 35 kg/m as calculated from the following:   Height as of this encounter: 5\' 9"  (1.753 m).   Weight as of this encounter: 107.5 kg (237 lb). Advance diet Up with therapy  Needs BM Remove drain Saturday am Acute post op blood loss anemia- recheck labs in the am. Continue with iron CM to assist with discharge  DVT Prophylaxis - Lovenox, Foot Pumps and TED hose Weight-Bearing as tolerated to left leg   T. Wednesday, PA-C [facility] 03/20/2017, 9:02 AM

## 2017-03-20 NOTE — Progress Notes (Signed)
Patient has h/o DVT, will be on Eliquis 5 mg bid for 6 weeks. Also has yeast infection secondary to antibiotics, Diflucan started

## 2017-03-20 NOTE — Anesthesia Postprocedure Evaluation (Signed)
Anesthesia Post Note  Patient: Claire Strong  Procedure(s) Performed: TOTAL HIP ARTHROPLASTY ANTERIOR APPROACH (Left Hip)  Patient location during evaluation: Nursing Unit Anesthesia Type: Spinal Level of consciousness: oriented and awake and alert Pain management: pain level controlled Vital Signs Assessment: post-procedure vital signs reviewed and stable Respiratory status: spontaneous breathing and respiratory function stable Cardiovascular status: stable Postop Assessment: no headache, no backache and no apparent nausea or vomiting Anesthetic complications: no     Last Vitals:  Vitals:   03/20/17 0320 03/20/17 0724  BP: 109/68 105/62  Pulse: 66 72  Resp: 19   Temp: 36.6 C 37.5 C  SpO2: 100% 100%    Last Pain:  Vitals:   03/20/17 0724  TempSrc: Oral  PainSc:                  Lenward Chancellor

## 2017-03-20 NOTE — Progress Notes (Signed)
Physical Therapy Treatment Patient Details Name: Claire Strong MRN: [id number] DOB: [birth year] Today's Date: 03/20/2017    History of Present Illness Pt is a 56 y/o F who presents s/p L THA.  Pt's PMH includes tachycardia, DVT, cancer.     PT Comments    Able to complete lap around nursing station with RW, cga/close sup; good stepping pattern/performance with fair/good stance time/weight acceptance L LE.  Slow, but steady, cadence (10' walk time, 11 seconds) but no overt buckling or LOB.  Patient very motivated, demonstrating excellent effort and participation. Will need to complete stairs next date.    Follow Up Recommendations  Home health PT     Equipment Recommendations  None recommended by PT    Recommendations for Other Services       Precautions / Restrictions Precautions Precautions: Fall;Other (comment) Precaution Comments: Monitor BP; no hip precautions, direct anterior approach Restrictions Weight Bearing Restrictions: Yes LLE Weight Bearing: Weight bearing as tolerated    Mobility  Bed Mobility               General bed mobility comments: seated in recliner beginning/end of treatment session  Transfers Overall transfer level: Needs assistance Equipment used: Rolling walker (2 wheeled) Transfers: Sit to/from Stand Sit to Stand: Supervision;Modified independent (Device/Increase time)            Ambulation/Gait Ambulation/Gait assistance: Supervision Ambulation Distance (Feet): 220 Feet     Gait velocity: 10' walk time, 11 seconds   General Gait Details: reciprocal stepping pattern; emphasis on L foot toe off, L hip extension and overall postural extension.  Decreased cadence, but steady and consistent throughout distance   Stairs            Wheelchair Mobility    Modified Rankin (Stroke Patients Only)       Balance Overall balance assessment: Needs assistance Sitting-balance support: No upper extremity supported;Feet  supported Sitting balance-Leahy Scale: Good     Standing balance support: Bilateral upper extremity supported Standing balance-Leahy Scale: Good                              Cognition Arousal/Alertness: Awake/alert Behavior During Therapy: WFL for tasks assessed/performed Overall Cognitive Status: Within Functional Limits for tasks assessed                                        Exercises Other Exercises Other Exercises: Standing LE therex, 1x10, AROM with RW, cga/close sup-min cuing for glut sets/activation in L LE stance to maintain neutral position in closed-chain activities Other Exercises: Toilet transfer, ambulatory with RW, cga/close sup; sit/stand from [facility] with RW, cga/close sup; hand hygiene at sink with Rw, close sup.  Good safety awareness with all functional activities.  Issued handout with supine therex for use as HEP; patient/husband voiced understanding.    General Comments        Pertinent Vitals/Pain Pain Assessment: 0-10 Faces Pain Scale: Hurts little more Pain Location: L hip Pain Descriptors / Indicators: Aching;Grimacing;Guarding Pain Intervention(s): Limited activity within patient's tolerance;Monitored during session;Repositioned;Premedicated before session    Home Living                      Prior Function            PT Goals (current goals can now be found in the care  plan section) Acute Rehab PT Goals Patient Stated Goal: to begin walking with therapy and to go home at d/c PT Goal Formulation: With patient Time For Goal Achievement: 04/02/17 Potential to Achieve Goals: Good Progress towards PT goals: Progressing toward goals    Frequency    BID      PT Plan Current plan remains appropriate    Co-evaluation              AM-PAC PT "6 Clicks" Daily Activity  Outcome Measure  Difficulty turning over in bed (including adjusting bedclothes, sheets and blankets)?: A Little Difficulty moving  from lying on back to sitting on the side of the bed? : A Little Difficulty sitting down on and standing up from a chair with arms (e.g., wheelchair, bedside commode, etc,.)?: A Little Help needed moving to and from a bed to chair (including a wheelchair)?: A Little Help needed walking in hospital room?: A Little Help needed climbing 3-5 steps with a railing? : A Little 6 Click Score: 18    End of Session Equipment Utilized During Treatment: Gait belt Activity Tolerance: Patient tolerated treatment well Patient left: in chair;with call bell/phone within reach;with chair alarm set;with family/visitor present Nurse Communication: Mobility status PT Visit Diagnosis: Pain;Unsteadiness on feet (R26.81);Other abnormalities of gait and mobility (R26.89);Muscle weakness (generalized) (M62.81) Pain - Right/Left: Left Pain - part of body: Hip     Time: 4237-0230 PT Time Calculation (min) (ACUTE ONLY): 23 min  Charges:  $Gait Training: 8-22 mins $Therapeutic Exercise: 8-22 mins                    G Codes:       Peggye Poon H. Manson Passey, PT, DPT, NCS 03/20/17, 4:16 PM [phone]

## 2017-03-21 LAB — BASIC METABOLIC PANEL
Anion gap: 5 (ref 5–15)
BUN: 8 mg/dL (ref 6–20)
CHLORIDE: 103 mmol/L (ref 101–111)
CO2: 26 mmol/L (ref 22–32)
Calcium: 7.7 mg/dL — ABNORMAL LOW (ref 8.9–10.3)
Creatinine, Ser: 0.82 mg/dL (ref 0.44–1.00)
Glucose, Bld: 114 mg/dL — ABNORMAL HIGH (ref 65–99)
POTASSIUM: 3.7 mmol/L (ref 3.5–5.1)
Sodium: 134 mmol/L — ABNORMAL LOW (ref 135–145)

## 2017-03-21 LAB — CBC
HEMATOCRIT: 26.1 % — AB (ref 35.0–47.0)
HEMOGLOBIN: 8.7 g/dL — AB (ref 12.0–16.0)
MCH: 26.5 pg (ref 26.0–34.0)
MCHC: 33.3 g/dL (ref 32.0–36.0)
MCV: 79.5 fL — ABNORMAL LOW (ref 80.0–100.0)
Platelets: 155 10*3/uL (ref 150–440)
RBC: 3.29 MIL/uL — ABNORMAL LOW (ref 3.80–5.20)
RDW: 14.4 % (ref 11.5–14.5)
WBC: 3.9 10*3/uL (ref 3.6–11.0)

## 2017-03-21 MED ORDER — ONDANSETRON HCL 4 MG PO TABS: 4.0000 mg | ORAL_TABLET | Freq: Three times a day (TID) | ORAL | 0 refills | Status: DC | PRN

## 2017-03-21 MED ORDER — FLUCONAZOLE 100 MG PO TABS: 100.0000 mg | ORAL_TABLET | Freq: Every day | ORAL | 0 refills | Status: DC

## 2017-03-21 MED ORDER — HYDROMORPHONE HCL 2 MG PO TABS: 2.0000 mg | ORAL_TABLET | Freq: Four times a day (QID) | ORAL | 0 refills | Status: DC | PRN

## 2017-03-21 MED ORDER — APIXABAN 5 MG PO TABS: 5.0000 mg | ORAL_TABLET | Freq: Two times a day (BID) | ORAL | 1 refills | Status: DC

## 2017-03-21 NOTE — Progress Notes (Signed)
Physical Therapy Treatment Patient Details Name: Claire Strong MRN: [id number] DOB: [birth year] Today's Date: 03/21/2017    History of Present Illness Pt is a 56 y/o F who presents s/p L THA.  Pt's PMH includes tachycardia, DVT, cancer.     PT Comments    Pt ready for session.  Stood and was able to ambulate around unit including up/down 4 stairs with bilateral rails with general ease.  To bathroom and able to attend to her own needs. Participated in exercises as described below.  Pt reported she would not use a walker upon discharge home.  Initially stated she would use cane. "I don't want to use the walker" and stated it is easier to move with SPC.  Discussed with pt and encouraged use of walker initially until HHPT was in to train/review with her and deemed she was ready to transition safely.  Safety concerns reviewed.  She continues to use RW heavily for support especially initially upon standing and for gait in general.  Cane training was not addressed as she is not appropriate at this time due to needing support of walker.  Pt voiced understanding and agreed.     Follow Up Recommendations  Home health PT     Equipment Recommendations  None recommended by PT    Recommendations for Other Services       Precautions / Restrictions Precautions Precautions: Fall;Other (comment) Precaution Comments:  no hip precautions, direct anterior approach Restrictions Weight Bearing Restrictions: Yes LLE Weight Bearing: Weight bearing as tolerated    Mobility  Bed Mobility               General bed mobility comments: seated in recliner beginning/end of treatment session  Transfers Overall transfer level: Needs assistance Equipment used: Rolling walker (2 wheeled) Transfers: Sit to/from Stand Sit to Stand: Supervision;Modified independent (Device/Increase time)         General transfer comment: good hand placement, LE strength and control; unable to tolerate full postural  extension due to back and L hip pain  Ambulation/Gait Ambulation/Gait assistance: Supervision Ambulation Distance (Feet): 220 Feet Assistive device: Rolling walker (2 wheeled) Gait Pattern/deviations: Step-through pattern         Stairs Stairs: Yes   Stair Management: Two rails Number of Stairs: 4 General stair comments: navigates well without difficulty  Wheelchair Mobility    Modified Rankin (Stroke Patients Only)       Balance Overall balance assessment: Needs assistance Sitting-balance support: No upper extremity supported;Feet supported Sitting balance-Leahy Scale: Good     Standing balance support: Bilateral upper extremity supported Standing balance-Leahy Scale: Good                              Cognition Arousal/Alertness: Awake/alert Behavior During Therapy: WFL for tasks assessed/performed Overall Cognitive Status: Within Functional Limits for tasks assessed                                        Exercises Other Exercises Other Exercises: standing AROM prior to gait fro hip/knee flexion and SLR x 10 Other Exercises: To commode after gait with suprvision    General Comments        Pertinent Vitals/Pain Pain Assessment: 0-10 Faces Pain Scale: Hurts little more Pain Location: L hip Pain Descriptors / Indicators: Aching;Grimacing;Guarding Pain Intervention(s): Limited activity within patient's tolerance  Home Living                      Prior Function            PT Goals (current goals can now be found in the care plan section) Progress towards PT goals: Progressing toward goals    Frequency    BID      PT Plan Current plan remains appropriate    Co-evaluation              AM-PAC PT "6 Clicks" Daily Activity  Outcome Measure  Difficulty turning over in bed (including adjusting bedclothes, sheets and blankets)?: A Little Difficulty moving from lying on back to sitting on the side of  the bed? : A Little Difficulty sitting down on and standing up from a chair with arms (e.g., wheelchair, bedside commode, etc,.)?: A Little Help needed moving to and from a bed to chair (including a wheelchair)?: A Little Help needed walking in hospital room?: A Little Help needed climbing 3-5 steps with a railing? : A Little 6 Click Score: 18    End of Session Equipment Utilized During Treatment: Gait belt Activity Tolerance: Patient tolerated treatment well Patient left: in chair;with call bell/phone within reach;with chair alarm set Nurse Communication: Other (comment) Pain - Right/Left: Left Pain - part of body: Hip     Time: 0042-2408 PT Time Calculation (min) (ACUTE ONLY): 17 min  Charges:  $Gait Training: 8-22 mins                    G Codes:       Danielle Dess, PTA 03/21/17, 9:21 AM

## 2017-03-21 NOTE — Progress Notes (Signed)
Subjective: 2 Days Post-Op Procedure(s) (LRB): TOTAL HIP ARTHROPLASTY ANTERIOR APPROACH (Left) Patient reports pain as mild.   Patient is well, and has had no acute complaints or problems.  Patient asymptomatic Denies any CP, SOB, ABD pain. We will continue therapy today.    Objective: Vital signs in last 24 hours: Temp:  [97.8 F (36.6 C)-99.5 F (37.5 C)] 97.8 F (36.6 C) (10/05 1959) Pulse Rate:  [72-76] 76 (10/05 1959) Resp:  [18] 18 (10/05 1959) BP: (105-127)/(62-80) 127/80 (10/05 1959) SpO2:  [98 %-100 %] 98 % (10/05 1959)  Intake/Output from previous day: 10/05 0701 - 10/06 0700 In: 1260 [P.O.:1260] Out: 253 [Urine:252; Stool:1] Intake/Output this shift: Total I/O In: 480 [P.O.:480] Out: 1 [Urine:1]   Recent Labs  03/19/17 1207 03/19/17 1400 03/20/17 0336 03/21/17 0419  HGB 9.6* 10.6* 8.6* 8.7*    Recent Labs  03/20/17 0336 03/21/17 0419  WBC 4.7 3.9  RBC 3.27* 3.29*  HCT 25.9* 26.1*  PLT 147* 155    Recent Labs  03/20/17 0336 03/21/17 0419  NA 134* 134*  K 3.6 3.7  CL 104 103  CO2 24 26  BUN 11 8  CREATININE 0.81 0.82  GLUCOSE 119* 114*  CALCIUM 7.5* 7.7*   No results for input(s): LABPT, INR in the last 72 hours.  EXAM General - Patient is Alert, Appropriate and Oriented Extremity - Neurovascular intact Sensation intact distally Intact pulses distally Dorsiflexion/Plantar flexion intact No cellulitis present Compartment soft Dressing - dressing C/D/I and no drainage, drain intact 150 output, wound vac intact with out drainage Motor Function - intact, moving foot and toes well on exam. Ambulated around the nurse's station.  Past Medical History:  Diagnosis Date  . Anxiety   . Basal cell carcinoma    nose  . Bleeding disorder ([place])   . Bronchial asthma   . Cancer ([place])    melanoma; right knee  . 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 Gigi Gin ortho)  . Chronic venous insufficiency 2007   s/p vein stripping and laser ablation  . Depression   . Diverticulosis    mild by colonoscopy  . History of DVT (deep vein thrombosis)    DVTs after 1st pregnancy, not on AC 2/2 bleeding ulcer, greenfield filter in place  . History of gastric ulcer 2009  . History of kidney stones   . Horseshoe kidney    sole, R kidney damage  . HTN (hypertension)   . Hypothyroidism   . Kidney stones    s/p renal hematoma after lithotripsy on right  . Lupus anticoagulant disorder ([place])   . Multinodular goiter    s/p thyroidectomy  . Nasal septal perforation    chronic, ENT rec avoid antihistamine, INS  . Osteoarthritis    ?FM by rheum  . Overweight   . Post-operative nausea and vomiting   . Post-surgical hypothyroidism    for multinodular goiter  . 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 Frutoso Chase  . Seasonal allergies    allergy shots, singulair  . 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 Tachycardia     Assessment/Plan:   2 Days Post-Op Procedure(s) (LRB): TOTAL HIP ARTHROPLASTY ANTERIOR APPROACH (Left) Active Problems:   Primary localized osteoarthritis of left hip   Acute post op blood loss anemia   Estimated body mass index is 35 kg/m as calculated from the following:   Height as of this encounter: 5\' 9"  (1.753 m).  Weight as of this encounter: 107.5 kg (237 lb). Advance diet Up with therapy  The patient had a bowel movement yesterday.  Acute post op blood loss anemia. Continue with iron CM to assist with discharge today home with home health.  DVT Prophylaxis - Lovenox, Foot Pumps and TED hose Weight-Bearing as tolerated to left leg   Dedra Skeens, PA-C [facility] 03/21/2017, 6:51 AM

## 2017-03-21 NOTE — Progress Notes (Signed)
Patient discharging home. Instructions and prescriptions given to patient, verbalized understanding. Transportation via daughter.

## 2017-03-21 NOTE — Care Management Note (Addendum)
Case Management Note  Patient Details  Name: Claire Strong MRN: [id number] Date of Birth: [birth year]  Subjective/Objective:        A referral for HH-PT, RN was called to [facility] per patient choice. [name] already has a RW and a BSC. She is already on Eliquis. A referral for Home Health PT and RN was called to [facility]. [facility] cannot accept because they do not have enough nurses, and [facility] cannot accept because of insurance. [name] was advised that [name] is in network with [facility] and she agreed that the [facility] referral should be sent to [facility]. A referral was made to [facility]. Discussed Eliquis with [name] who reports that she has over 2 weeks of Eliquis at home and will get a refill from her surgeon.            Action/Plan:   Expected Discharge Date:  03/21/17               Expected Discharge Plan:  Home w Home Health Services  In-House Referral:     Discharge planning Services  CM Consult  Post Acute Care Choice:  Home Health Choice offered to:  Patient, Spouse  DME Arranged:    DME Agency:     HH Arranged:  PT HH Agency:  [facility]  Status of Service:  In process, will continue to follow  If discussed at Long Length of Stay Meetings, dates discussed:    Additional Comments:  Talik Casique A, RN 03/21/2017, 8:53 AM

## 2017-03-23 DIAGNOSIS — M1611 Unilateral primary osteoarthritis, right hip: Secondary | ICD-10-CM | POA: Diagnosis not present

## 2017-03-23 DIAGNOSIS — I739 Peripheral vascular disease, unspecified: Secondary | ICD-10-CM | POA: Diagnosis not present

## 2017-03-23 DIAGNOSIS — F329 Major depressive disorder, single episode, unspecified: Secondary | ICD-10-CM | POA: Diagnosis not present

## 2017-03-23 DIAGNOSIS — Z7982 Long term (current) use of aspirin: Secondary | ICD-10-CM | POA: Diagnosis not present

## 2017-03-23 DIAGNOSIS — D509 Iron deficiency anemia, unspecified: Secondary | ICD-10-CM | POA: Diagnosis not present

## 2017-03-23 DIAGNOSIS — E042 Nontoxic multinodular goiter: Secondary | ICD-10-CM | POA: Diagnosis not present

## 2017-03-23 DIAGNOSIS — I10 Essential (primary) hypertension: Secondary | ICD-10-CM | POA: Diagnosis not present

## 2017-03-23 DIAGNOSIS — Z7901 Long term (current) use of anticoagulants: Secondary | ICD-10-CM | POA: Diagnosis not present

## 2017-03-23 DIAGNOSIS — I872 Venous insufficiency (chronic) (peripheral): Secondary | ICD-10-CM | POA: Diagnosis not present

## 2017-03-23 DIAGNOSIS — F419 Anxiety disorder, unspecified: Secondary | ICD-10-CM | POA: Diagnosis not present

## 2017-03-23 DIAGNOSIS — M797 Fibromyalgia: Secondary | ICD-10-CM | POA: Diagnosis not present

## 2017-03-23 DIAGNOSIS — Z471 Aftercare following joint replacement surgery: Secondary | ICD-10-CM | POA: Diagnosis not present

## 2017-03-23 DIAGNOSIS — D6862 Lupus anticoagulant syndrome: Secondary | ICD-10-CM | POA: Diagnosis not present

## 2017-03-23 LAB — SURGICAL PATHOLOGY

## 2017-03-24 ENCOUNTER — Encounter: Payer: Self-pay | Admitting: Family Medicine

## 2017-03-24 DIAGNOSIS — I739 Peripheral vascular disease, unspecified: Secondary | ICD-10-CM | POA: Diagnosis not present

## 2017-03-24 DIAGNOSIS — D6862 Lupus anticoagulant syndrome: Secondary | ICD-10-CM | POA: Diagnosis not present

## 2017-03-24 DIAGNOSIS — Z7901 Long term (current) use of anticoagulants: Secondary | ICD-10-CM | POA: Diagnosis not present

## 2017-03-24 DIAGNOSIS — E042 Nontoxic multinodular goiter: Secondary | ICD-10-CM | POA: Diagnosis not present

## 2017-03-24 DIAGNOSIS — M1611 Unilateral primary osteoarthritis, right hip: Secondary | ICD-10-CM | POA: Diagnosis not present

## 2017-03-24 DIAGNOSIS — I10 Essential (primary) hypertension: Secondary | ICD-10-CM | POA: Diagnosis not present

## 2017-03-24 DIAGNOSIS — Z471 Aftercare following joint replacement surgery: Secondary | ICD-10-CM | POA: Diagnosis not present

## 2017-03-24 DIAGNOSIS — D509 Iron deficiency anemia, unspecified: Secondary | ICD-10-CM | POA: Diagnosis not present

## 2017-03-24 DIAGNOSIS — M797 Fibromyalgia: Secondary | ICD-10-CM | POA: Diagnosis not present

## 2017-03-24 DIAGNOSIS — I872 Venous insufficiency (chronic) (peripheral): Secondary | ICD-10-CM | POA: Diagnosis not present

## 2017-03-24 DIAGNOSIS — F329 Major depressive disorder, single episode, unspecified: Secondary | ICD-10-CM | POA: Diagnosis not present

## 2017-03-24 DIAGNOSIS — F419 Anxiety disorder, unspecified: Secondary | ICD-10-CM | POA: Diagnosis not present

## 2017-03-24 DIAGNOSIS — Z7982 Long term (current) use of aspirin: Secondary | ICD-10-CM | POA: Diagnosis not present

## 2017-03-27 DIAGNOSIS — E042 Nontoxic multinodular goiter: Secondary | ICD-10-CM | POA: Diagnosis not present

## 2017-03-27 DIAGNOSIS — D6862 Lupus anticoagulant syndrome: Secondary | ICD-10-CM | POA: Diagnosis not present

## 2017-03-27 DIAGNOSIS — D509 Iron deficiency anemia, unspecified: Secondary | ICD-10-CM | POA: Diagnosis not present

## 2017-03-27 DIAGNOSIS — Z7982 Long term (current) use of aspirin: Secondary | ICD-10-CM | POA: Diagnosis not present

## 2017-03-27 DIAGNOSIS — F329 Major depressive disorder, single episode, unspecified: Secondary | ICD-10-CM | POA: Diagnosis not present

## 2017-03-27 DIAGNOSIS — F419 Anxiety disorder, unspecified: Secondary | ICD-10-CM | POA: Diagnosis not present

## 2017-03-27 DIAGNOSIS — Z471 Aftercare following joint replacement surgery: Secondary | ICD-10-CM | POA: Diagnosis not present

## 2017-03-27 DIAGNOSIS — I739 Peripheral vascular disease, unspecified: Secondary | ICD-10-CM | POA: Diagnosis not present

## 2017-03-27 DIAGNOSIS — M1611 Unilateral primary osteoarthritis, right hip: Secondary | ICD-10-CM | POA: Diagnosis not present

## 2017-03-27 DIAGNOSIS — I872 Venous insufficiency (chronic) (peripheral): Secondary | ICD-10-CM | POA: Diagnosis not present

## 2017-03-27 DIAGNOSIS — I10 Essential (primary) hypertension: Secondary | ICD-10-CM | POA: Diagnosis not present

## 2017-03-27 DIAGNOSIS — Z7901 Long term (current) use of anticoagulants: Secondary | ICD-10-CM | POA: Diagnosis not present

## 2017-03-27 DIAGNOSIS — M797 Fibromyalgia: Secondary | ICD-10-CM | POA: Diagnosis not present

## 2017-03-29 ENCOUNTER — Other Ambulatory Visit: Payer: Self-pay | Admitting: Family Medicine

## 2017-03-29 DIAGNOSIS — I10 Essential (primary) hypertension: Secondary | ICD-10-CM

## 2017-03-29 DIAGNOSIS — D6862 Lupus anticoagulant syndrome: Secondary | ICD-10-CM

## 2017-03-29 DIAGNOSIS — E890 Postprocedural hypothyroidism: Secondary | ICD-10-CM

## 2017-03-30 ENCOUNTER — Other Ambulatory Visit: Payer: BLUE CROSS/BLUE SHIELD

## 2017-03-30 DIAGNOSIS — F329 Major depressive disorder, single episode, unspecified: Secondary | ICD-10-CM | POA: Diagnosis not present

## 2017-03-30 DIAGNOSIS — D6862 Lupus anticoagulant syndrome: Secondary | ICD-10-CM | POA: Diagnosis not present

## 2017-03-30 DIAGNOSIS — D509 Iron deficiency anemia, unspecified: Secondary | ICD-10-CM | POA: Diagnosis not present

## 2017-03-30 DIAGNOSIS — E042 Nontoxic multinodular goiter: Secondary | ICD-10-CM | POA: Diagnosis not present

## 2017-03-30 DIAGNOSIS — Z471 Aftercare following joint replacement surgery: Secondary | ICD-10-CM | POA: Diagnosis not present

## 2017-03-30 DIAGNOSIS — I739 Peripheral vascular disease, unspecified: Secondary | ICD-10-CM | POA: Diagnosis not present

## 2017-03-30 DIAGNOSIS — M797 Fibromyalgia: Secondary | ICD-10-CM | POA: Diagnosis not present

## 2017-03-30 DIAGNOSIS — Z7901 Long term (current) use of anticoagulants: Secondary | ICD-10-CM | POA: Diagnosis not present

## 2017-03-30 DIAGNOSIS — Z7982 Long term (current) use of aspirin: Secondary | ICD-10-CM | POA: Diagnosis not present

## 2017-03-30 DIAGNOSIS — M1611 Unilateral primary osteoarthritis, right hip: Secondary | ICD-10-CM | POA: Diagnosis not present

## 2017-03-30 DIAGNOSIS — I872 Venous insufficiency (chronic) (peripheral): Secondary | ICD-10-CM | POA: Diagnosis not present

## 2017-03-30 DIAGNOSIS — I10 Essential (primary) hypertension: Secondary | ICD-10-CM | POA: Diagnosis not present

## 2017-03-30 DIAGNOSIS — F419 Anxiety disorder, unspecified: Secondary | ICD-10-CM | POA: Diagnosis not present

## 2017-04-01 DIAGNOSIS — D6862 Lupus anticoagulant syndrome: Secondary | ICD-10-CM | POA: Diagnosis not present

## 2017-04-01 DIAGNOSIS — Z7901 Long term (current) use of anticoagulants: Secondary | ICD-10-CM | POA: Diagnosis not present

## 2017-04-01 DIAGNOSIS — Z7982 Long term (current) use of aspirin: Secondary | ICD-10-CM | POA: Diagnosis not present

## 2017-04-01 DIAGNOSIS — I10 Essential (primary) hypertension: Secondary | ICD-10-CM | POA: Diagnosis not present

## 2017-04-01 DIAGNOSIS — M797 Fibromyalgia: Secondary | ICD-10-CM | POA: Diagnosis not present

## 2017-04-01 DIAGNOSIS — I872 Venous insufficiency (chronic) (peripheral): Secondary | ICD-10-CM | POA: Diagnosis not present

## 2017-04-01 DIAGNOSIS — F329 Major depressive disorder, single episode, unspecified: Secondary | ICD-10-CM | POA: Diagnosis not present

## 2017-04-01 DIAGNOSIS — E042 Nontoxic multinodular goiter: Secondary | ICD-10-CM | POA: Diagnosis not present

## 2017-04-01 DIAGNOSIS — I739 Peripheral vascular disease, unspecified: Secondary | ICD-10-CM | POA: Diagnosis not present

## 2017-04-01 DIAGNOSIS — D509 Iron deficiency anemia, unspecified: Secondary | ICD-10-CM | POA: Diagnosis not present

## 2017-04-01 DIAGNOSIS — F419 Anxiety disorder, unspecified: Secondary | ICD-10-CM | POA: Diagnosis not present

## 2017-04-01 DIAGNOSIS — Z471 Aftercare following joint replacement surgery: Secondary | ICD-10-CM | POA: Diagnosis not present

## 2017-04-01 DIAGNOSIS — M1611 Unilateral primary osteoarthritis, right hip: Secondary | ICD-10-CM | POA: Diagnosis not present

## 2017-04-02 ENCOUNTER — Encounter: Payer: BLUE CROSS/BLUE SHIELD | Admitting: Family Medicine

## 2017-04-07 NOTE — Discharge Summary (Signed)
Physician Discharge Summary  Subjective: 19 Days Post-Op Procedure(s) (LRB): TOTAL HIP ARTHROPLASTY ANTERIOR APPROACH (Left) Patient reports pain as moderate.   Patient seen in rounds with Dr. Rosita Kea. Patient is well, and has had no acute complaints or problems Patient is ready to go Home with Home Health PT  Physician Discharge Summary  Patient ID: Claire Strong MRN: [id number] DOB/AGE: [birth year] 56 y.o.  Admit date: 03/19/2017 Discharge date: 04/07/2017  Admission Diagnoses:  Discharge Diagnoses:  Active Problems:   Primary localized osteoarthritis of left hip   Discharged Condition: good  Hospital Course: The patient is POD #2, and doing very well.  She is stable with labs and Vitals.  She has done stairs and ambulated 220 feet.  She is ready to go home.  Treatments: surgery:  TOTAL HIP ARTHROPLASTY ANTERIOR APPROACH (Left)  SURGEON: Leitha Schuller, MD  ASSISTANTS: None  ANESTHESIA:   spinal  EBL:  Total I/O In: 800 [I.V.:800] Out: 825 [Urine:125; Blood:700]  BLOOD ADMINISTERED:none  DRAINS: (2) Hemovact drain(s) in the Subcutaneous layer with  Suction Open   LOCAL MEDICATIONS USED:  MARCAINE     SPECIMEN:  Source of Specimen:  Left femoral head  DISPOSITION OF SPECIMEN:  PATHOLOGY  COUNTS:  YES  TOURNIQUET:  * No tourniquets in log *  IMPLANTS: Medacta Amis 3 standard stem with ceramic 28 mm S head, 52 mm Mpact DM cup and liner   Discharge Exam: Blood pressure 126/86, pulse 77, temperature 98.5 F (36.9 C), temperature source Oral, resp. rate 16, height 5\' 9"  (1.753 m), weight 107.5 kg (237 lb), SpO2 100 %.   Disposition: 01-Home or Self Care   Allergies as of 03/21/2017      Reactions   Sulfa Antibiotics Shortness Of Breath   Cefuroxime Axetil Other (See Comments)   Bleeding ulcer   Nutritional Supplements Swelling   Unsure which   Other    All narcotics    Oxycodone-acetaminophen Nausea And Vomiting   Betadine [povidone  Iodine] Rash      Medication List    TAKE these medications   acetaminophen 500 MG tablet Commonly known as:  TYLENOL Take 1,000 mg by mouth 2 (two) times daily.   ACETAMINOPHEN-BUTALBITAL 50-325 MG Tabs Take 1 tablet by mouth at bedtime as needed (sleep). Reported on 09/03/2015 3 times a week   albuterol 108 (90 Base) MCG/ACT inhaler Commonly known as:  PROVENTIL HFA;VENTOLIN HFA Inhale 2 puffs into the lungs every 6 (six) hours as needed. What changed:  reasons to take this   apixaban 5 MG Tabs tablet Commonly known as:  ELIQUIS Take 1 tablet (5 mg total) by mouth 2 (two) times daily.   aspirin 81 MG tablet Take 81 mg by mouth daily.   benazepril 10 MG tablet Commonly known as:  LOTENSIN TAKE ONE TABLET BY MOUTH ONCE DAILY What changed:  See the new instructions.   carisoprodol 350 MG tablet Commonly known as:  SOMA TAKE ONE TABLET BY MOUTH AT BEDTIME   cetirizine 10 MG tablet Commonly known as:  ZYRTEC Take 10 mg by mouth daily.   cholecalciferol 1000 units tablet Commonly known as:  VITAMIN D Take 2,000 Units by mouth daily.   citalopram 40 MG tablet Commonly known as:  CELEXA TAKE ONE TABLET BY MOUTH ONCE DAILY   EPINEPHrine 0.3 mg/0.3 mL Devi Commonly known as:  EPI-PEN Inject 0.3 mLs (0.3 mg total) into the muscle once.   fluconazole 100 MG tablet Commonly known as:  DIFLUCAN Take  1 tablet (100 mg total) by mouth daily.   Fluticasone-Salmeterol 100-50 MCG/DOSE Aepb Commonly known as:  ADVAIR DISKUS Inhale 1 puff into the lungs every 12 (twelve) hours. What changed:  when to take this  reasons to take this   furosemide 20 MG tablet Commonly known as:  LASIX TAKE ONE TABLET BY MOUTH ONCE DAILY   gabapentin 300 MG capsule Commonly known as:  NEURONTIN Take 1 capsule (300 mg total) by mouth 3 (three) times a week.   HYDROmorphone 2 MG tablet Commonly known as:  DILAUDID Take 1 tablet (2 mg total) by mouth every 6 (six) hours as needed for  severe pain (takes along with Zofran).   levothyroxine 150 MCG tablet Commonly known as:  SYNTHROID, LEVOTHROID Take 150 mcg by mouth daily before breakfast.   metoprolol tartrate 25 MG tablet Commonly known as:  LOPRESSOR TAKE ONE TABLET BY MOUTH TWICE DAILY   montelukast 10 MG tablet Commonly known as:  SINGULAIR Take 10 mg by mouth every morning.   ondansetron 4 MG tablet Commonly known as:  ZOFRAN Take 1 tablet (4 mg total) by mouth every 8 (eight) hours as needed for nausea or vomiting.   traZODone 50 MG tablet Commonly known as:  DESYREL 1/2 to 1 tablet by mouth at bedtime as needed for sleep What changed:  how much to take  how to take this  when to take this  additional instructions   Turmeric 500 MG Caps Take 1 capsule by mouth 2 (two) times daily.   vitamin B-12 500 MCG tablet Commonly known as:  CYANOCOBALAMIN Take 500 mcg by mouth daily.        SignedPrescott Parma, Marilynne Dupuis 04/07/2017, 2:35 PM   Objective: Vital signs in last 24 hours:    Intake/Output from previous day: No intake or output data in the 24 hours ending 04/07/17 1435  Intake/Output this shift: No intake/output data recorded.  Labs: No results for input(s): HGB in the last 72 hours. No results for input(s): WBC, RBC, HCT, PLT in the last 72 hours. No results for input(s): NA, K, CL, CO2, BUN, CREATININE, GLUCOSE, CALCIUM in the last 72 hours. No results for input(s): LABPT, INR in the last 72 hours.  EXAM: General - Patient is Alert and Oriented Extremity - Sensation intact distally Dorsiflexion/Plantar flexion intact No cellulitis present Compartment soft Incision - clean, dry, no drainage Motor Function -  DF/PF intact  Assessment/Plan: 19 Days Post-Op Procedure(s) (LRB): TOTAL HIP ARTHROPLASTY ANTERIOR APPROACH (Left) Procedure(s) (LRB): TOTAL HIP ARTHROPLASTY ANTERIOR APPROACH (Left) Past Medical History:  Diagnosis Date  . Anxiety   . Basal cell carcinoma    nose  .  Bleeding disorder ([place])   . Bronchial asthma   . Cancer ([place])    melanoma; right knee  . 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 Gigi Gin ortho)  . Chronic venous insufficiency 2007   s/p vein stripping and laser ablation  . Depression   . Diverticulosis    mild by colonoscopy  . History of DVT (deep vein thrombosis)    DVTs after 1st pregnancy, not on AC 2/2 bleeding ulcer, greenfield filter in place  . History of gastric ulcer 2009  . History of kidney stones   . Horseshoe kidney    sole, R kidney damage  . HTN (hypertension)   . Hypothyroidism   . Kidney stones    s/p renal hematoma after lithotripsy on right  . Lupus anticoagulant disorder (  [place])   . Multinodular goiter    s/p thyroidectomy  . Nasal septal perforation    chronic, ENT rec avoid antihistamine, INS  . Osteoarthritis    ?FM by rheum  . Overweight   . Post-operative nausea and vomiting   . Post-surgical hypothyroidism    for multinodular goiter  . 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 Frutoso Chase  . Seasonal allergies    allergy shots, singulair  . 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 Tachycardia    Active Problems:   Primary localized osteoarthritis of left hip  Estimated body mass index is 35 kg/m as calculated from the following:   Height as of this encounter: 5\' 9"  (1.753 m).   Weight as of this encounter: 107.5 kg (237 lb). Discharge home with home health Diet - Regular diet Follow up - in 2 weeks Activity - WBAT Disposition - Home Condition Upon Discharge - Good DVT Prophylaxis - Lovenox and TED hose  Reche Dixon, PA-C Orthopaedic Surgery 04/07/2017, 2:35 PM

## 2017-04-11 ENCOUNTER — Encounter: Payer: Self-pay | Admitting: Hematology and Oncology

## 2017-04-13 ENCOUNTER — Telehealth: Payer: Self-pay | Admitting: *Deleted

## 2017-04-13 DIAGNOSIS — M25652 Stiffness of left hip, not elsewhere classified: Secondary | ICD-10-CM | POA: Diagnosis not present

## 2017-04-13 DIAGNOSIS — M25552 Pain in left hip: Secondary | ICD-10-CM | POA: Diagnosis not present

## 2017-04-13 NOTE — Telephone Encounter (Signed)
Called patient to inform her that Eliquis does not affect INR.  Patient advised to contact PCP or surgeon.

## 2017-04-13 NOTE — Telephone Encounter (Signed)
-----   Message from Rosey Bath, MD sent at 04/13/2017  5:05 PM EDT ----- Regarding: Please call the patient  Eliquis does not affect the INR.  Her note is unclear.  She likely needs to talk to her surgeon or physician about these issues.  M

## 2017-04-16 DIAGNOSIS — N952 Postmenopausal atrophic vaginitis: Secondary | ICD-10-CM | POA: Diagnosis not present

## 2017-04-16 DIAGNOSIS — M25552 Pain in left hip: Secondary | ICD-10-CM | POA: Diagnosis not present

## 2017-04-16 DIAGNOSIS — M25652 Stiffness of left hip, not elsewhere classified: Secondary | ICD-10-CM | POA: Diagnosis not present

## 2017-04-16 DIAGNOSIS — F39 Unspecified mood [affective] disorder: Secondary | ICD-10-CM | POA: Diagnosis not present

## 2017-04-16 DIAGNOSIS — R232 Flushing: Secondary | ICD-10-CM | POA: Diagnosis not present

## 2017-04-20 ENCOUNTER — Encounter: Payer: Self-pay | Admitting: Cardiovascular Disease

## 2017-04-20 DIAGNOSIS — M25652 Stiffness of left hip, not elsewhere classified: Secondary | ICD-10-CM | POA: Diagnosis not present

## 2017-04-20 DIAGNOSIS — M25552 Pain in left hip: Secondary | ICD-10-CM | POA: Diagnosis not present

## 2017-04-23 ENCOUNTER — Other Ambulatory Visit: Payer: Self-pay | Admitting: Family Medicine

## 2017-04-23 ENCOUNTER — Other Ambulatory Visit (INDEPENDENT_AMBULATORY_CARE_PROVIDER_SITE_OTHER): Payer: BLUE CROSS/BLUE SHIELD

## 2017-04-23 DIAGNOSIS — E890 Postprocedural hypothyroidism: Secondary | ICD-10-CM | POA: Diagnosis not present

## 2017-04-23 DIAGNOSIS — D6862 Lupus anticoagulant syndrome: Secondary | ICD-10-CM | POA: Diagnosis not present

## 2017-04-23 DIAGNOSIS — M25552 Pain in left hip: Secondary | ICD-10-CM | POA: Diagnosis not present

## 2017-04-23 DIAGNOSIS — I10 Essential (primary) hypertension: Secondary | ICD-10-CM

## 2017-04-23 DIAGNOSIS — M25652 Stiffness of left hip, not elsewhere classified: Secondary | ICD-10-CM | POA: Diagnosis not present

## 2017-04-23 LAB — COMPREHENSIVE METABOLIC PANEL
ALBUMIN: 4.1 g/dL (ref 3.5–5.2)
ALT: 14 U/L (ref 0–35)
AST: 18 U/L (ref 0–37)
Alkaline Phosphatase: 152 U/L — ABNORMAL HIGH (ref 39–117)
BUN: 12 mg/dL (ref 6–23)
CALCIUM: 9 mg/dL (ref 8.4–10.5)
CHLORIDE: 105 meq/L (ref 96–112)
CO2: 27 meq/L (ref 19–32)
Creatinine, Ser: 0.95 mg/dL (ref 0.40–1.20)
GFR: 64.74 mL/min (ref >60.00)
Glucose, Bld: 98 mg/dL (ref 70–99)
POTASSIUM: 4.6 meq/L (ref 3.5–5.1)
Sodium: 139 mEq/L (ref 135–145)
Total Bilirubin: 0.6 mg/dL (ref 0.2–1.2)
Total Protein: 6.7 g/dL (ref 6.0–8.3)

## 2017-04-23 LAB — CBC WITH DIFFERENTIAL/PLATELET
BASOS ABS: 0 10*3/uL (ref 0.0–0.1)
BASOS PCT: 0.4 % (ref 0.0–3.0)
Eosinophils Absolute: 0.1 10*3/uL (ref 0.0–0.7)
Eosinophils Relative: 2.4 % (ref 0.0–5.0)
HCT: 33.8 % — ABNORMAL LOW (ref 36.0–46.0)
Hemoglobin: 10.7 g/dL — ABNORMAL LOW (ref 12.0–15.0)
LYMPHS ABS: 1.6 10*3/uL (ref 0.7–4.0)
Lymphocytes Relative: 35.1 % (ref 12.0–46.0)
MCHC: 31.6 g/dL (ref 30.0–36.0)
MCV: 81.2 fl (ref 78.0–100.0)
MONOS PCT: 6.8 % (ref 3.0–12.0)
Monocytes Absolute: 0.3 10*3/uL (ref 0.1–1.0)
NEUTROS ABS: 2.4 10*3/uL (ref 1.4–7.7)
NEUTROS PCT: 55.3 % (ref 43.0–77.0)
PLATELETS: 253 10*3/uL (ref 150.0–400.0)
RBC: 4.16 Mil/uL (ref 3.87–5.11)
RDW: 14 % (ref 11.5–15.5)
WBC: 4.4 10*3/uL (ref 4.0–10.5)

## 2017-04-23 LAB — LIPID PANEL
CHOL/HDL RATIO: 4
CHOLESTEROL: 172 mg/dL (ref 0–200)
HDL: 43.7 mg/dL (ref >39.00)
LDL CALC: 104 mg/dL — AB (ref 0–99)
NonHDL: 128.39
TRIGLYCERIDES: 122 mg/dL (ref 0.0–149.0)
VLDL: 24.4 mg/dL (ref 0.0–40.0)

## 2017-04-23 LAB — T4, FREE: Free T4: 0.94 ng/dL (ref 0.60–1.60)

## 2017-04-23 LAB — TSH: TSH: 1.41 u[IU]/mL (ref 0.35–4.50)

## 2017-04-28 DIAGNOSIS — M25552 Pain in left hip: Secondary | ICD-10-CM | POA: Diagnosis not present

## 2017-04-28 DIAGNOSIS — M25652 Stiffness of left hip, not elsewhere classified: Secondary | ICD-10-CM | POA: Diagnosis not present

## 2017-04-30 ENCOUNTER — Ambulatory Visit (INDEPENDENT_AMBULATORY_CARE_PROVIDER_SITE_OTHER): Payer: BLUE CROSS/BLUE SHIELD | Admitting: Family Medicine

## 2017-04-30 ENCOUNTER — Encounter: Payer: Self-pay | Admitting: Family Medicine

## 2017-04-30 VITALS — BP 116/70 | HR 58 | Temp 97.8°F | Ht 69.0 in | Wt 235.0 lb

## 2017-04-30 DIAGNOSIS — R748 Abnormal levels of other serum enzymes: Secondary | ICD-10-CM

## 2017-04-30 DIAGNOSIS — D509 Iron deficiency anemia, unspecified: Secondary | ICD-10-CM | POA: Diagnosis not present

## 2017-04-30 DIAGNOSIS — F3341 Major depressive disorder, recurrent, in partial remission: Secondary | ICD-10-CM

## 2017-04-30 DIAGNOSIS — I10 Essential (primary) hypertension: Secondary | ICD-10-CM | POA: Diagnosis not present

## 2017-04-30 DIAGNOSIS — Z0000 Encounter for general adult medical examination without abnormal findings: Secondary | ICD-10-CM | POA: Diagnosis not present

## 2017-04-30 DIAGNOSIS — E890 Postprocedural hypothyroidism: Secondary | ICD-10-CM

## 2017-04-30 DIAGNOSIS — M150 Primary generalized (osteo)arthritis: Secondary | ICD-10-CM

## 2017-04-30 DIAGNOSIS — D6862 Lupus anticoagulant syndrome: Secondary | ICD-10-CM

## 2017-04-30 DIAGNOSIS — Z23 Encounter for immunization: Secondary | ICD-10-CM | POA: Diagnosis not present

## 2017-04-30 DIAGNOSIS — Z9884 Bariatric surgery status: Secondary | ICD-10-CM

## 2017-04-30 DIAGNOSIS — M159 Polyosteoarthritis, unspecified: Secondary | ICD-10-CM

## 2017-04-30 DIAGNOSIS — M8949 Other hypertrophic osteoarthropathy, multiple sites: Secondary | ICD-10-CM

## 2017-04-30 LAB — VITAMIN D 25 HYDROXY (VIT D DEFICIENCY, FRACTURES): VITD: 57.34 ng/mL (ref 30.00–100.00)

## 2017-04-30 LAB — GAMMA GT: GGT: 34 U/L (ref 7–51)

## 2017-04-30 LAB — IBC PANEL
Iron: 23 ug/dL — ABNORMAL LOW (ref 42–145)
Saturation Ratios: 5.5 % — ABNORMAL LOW (ref 20.0–50.0)
Transferrin: 297 mg/dL (ref 212.0–360.0)

## 2017-04-30 LAB — FERRITIN: Ferritin: 10.1 ng/mL (ref 10.0–291.0)

## 2017-04-30 LAB — VITAMIN B12: VITAMIN B 12: 1033 pg/mL — AB (ref 211–911)

## 2017-04-30 LAB — FOLATE: FOLATE: 13.6 ng/mL (ref >5.9)

## 2017-04-30 NOTE — Assessment & Plan Note (Signed)
Chronic, stable. Continue current regimen. 

## 2017-04-30 NOTE — Assessment & Plan Note (Signed)
?  of this - latest hematological evaluation was negative for this. Just recommending anticoagulation perioperatively.

## 2017-04-30 NOTE — Assessment & Plan Note (Signed)
TFTs stable. Continue levothyroxine.

## 2017-04-30 NOTE — Assessment & Plan Note (Signed)
Update labs.  

## 2017-04-30 NOTE — Progress Notes (Signed)
BP 116/70 (BP Location: Left Arm, Patient Position: Sitting, Cuff Size: Large)   Pulse (!) 58   Temp 97.8 F (36.6 C) (Oral)   Ht 5\' 9"  (1.753 m)   Wt 235 lb (106.6 kg)   SpO2 97%   BMI 34.70 kg/m    CC: CPE Subjective:    Patient ID: Claire Strong, female    DOB: 08-06-1960, 56 y.o.   MRN: [id number]  HPI: Claire Strong is a 56 y.o. female presenting on 04/30/2017 for Annual Exam (gets Pap with new GYN)   She saw new hematologist - told did not have lupus anticoagulant disorder. rec stop eliquis. Just recommended AC around surgeries or procedures. IVC filter removed earlier this year. She did not have IVC filter placed for the latest surgery.   PHQ9 = 19. Feels stable on celexa 40mg  daily. Now on HRT for possible menopausal related mood swings.   Anemia - improved since surgery but staying low - since surgery. Did not have blood transfusion.   Preventative: COLONOSCOPY Date: 05/2012 hyperplastic polyps x2, mild diverticulosis Claire Strong) rpt 10 yrs Mammogram 08/2016 [facility] - with Rayne Du. Started on HRT - estrace 1mg  tablet. Last pap smear 2009, always normal. S/p hysterectomy for uterine prolapse 1989. Ovaries remain.  Flu shot yearly Tdap 12/19/2010.  Seat belt use discussed.  Sunscreen use discussed. No changing moles on skin.  Non smoker Alcohol - seldom  Caffeine: 1 cup coffee/day  Lives with husband Claire Strong ([employer])), youngest daughter. 1 dog. 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 Denied for disability x3. Activity: taking care of grandson, limited activity after surgery Diet: good water, fruits/vegetables daily   Relevant past medical, surgical, family and social history reviewed and updated as indicated. Interim medical history since our last visit reviewed. Allergies and medications reviewed and updated. Outpatient Medications Prior to Visit  Medication Sig Dispense Refill  . acetaminophen (TYLENOL) 500 MG tablet Take  1,000 mg by mouth 2 (two) times daily.     . ACETAMINOPHEN-BUTALBITAL 50-325 MG TABS Take 1 tablet by mouth at bedtime as needed (sleep). Reported on 09/03/2015 3 times a week    . albuterol (PROVENTIL HFA;VENTOLIN HFA) 108 (90 BASE) MCG/ACT inhaler Inhale 2 puffs into the lungs every 6 (six) hours as needed. (Patient taking differently: Inhale 2 puffs into the lungs every 6 (six) hours as needed for wheezing or shortness of breath. ) 1 Inhaler 11  . apixaban (ELIQUIS) 5 MG TABS tablet Take 1 tablet (5 mg total) by mouth 2 (two) times daily. 60 tablet 1  . aspirin 81 MG tablet Take 81 mg by mouth daily.    . benazepril (LOTENSIN) 10 MG tablet TAKE ONE TABLET BY MOUTH ONCE DAILY (Patient taking differently: TAKE ONE TABLET BY MOUTH ONCE DAILY AT BEDTIME) 90 tablet 3  . carisoprodol (SOMA) 350 MG tablet TAKE ONE TABLET BY MOUTH AT BEDTIME 90 tablet 1  . cetirizine (ZYRTEC) 10 MG tablet Take 10 mg by mouth daily.    . Cholecalciferol (VITAMIN D3) 1000 UNITS tablet Take 2,000 Units by mouth daily.     . citalopram (CELEXA) 40 MG tablet TAKE ONE TABLET BY MOUTH ONCE DAILY 90 tablet 3  . EPINEPHrine (EPI-PEN) 0.3 mg/0.3 mL DEVI Inject 0.3 mLs (0.3 mg total) into the muscle once. (Patient taking differently: Inject 0.3 mg into the muscle once. ) 1 Device 2  . estradiol (ESTRACE) 1 MG tablet Take 1 tablet daily by mouth.  0  .  fluconazole (DIFLUCAN) 100 MG tablet Take 1 tablet (100 mg total) by mouth daily. 6 tablet 0  . Fluticasone-Salmeterol (ADVAIR DISKUS) 100-50 MCG/DOSE AEPB Inhale 1 puff into the lungs every 12 (twelve) hours. (Patient taking differently: Inhale 1 puff into the lungs 2 (two) times daily as needed. ) 60 each 3  . furosemide (LASIX) 20 MG tablet TAKE ONE TABLET BY MOUTH ONCE DAILY 90 tablet 0  . gabapentin (NEURONTIN) 300 MG capsule Take 1 capsule (300 mg total) by mouth 3 (three) times a week. (Patient not taking: Reported on 03/19/2017) 12 capsule 11  . HYDROmorphone (DILAUDID) 2 MG  tablet Take 1 tablet (2 mg total) by mouth every 6 (six) hours as needed for severe pain (takes along with Zofran). 30 tablet 0  . levothyroxine (SYNTHROID, LEVOTHROID) 150 MCG tablet Take 150 mcg by mouth daily before breakfast.    . metoprolol tartrate (LOPRESSOR) 25 MG tablet TAKE ONE TABLET BY MOUTH TWICE DAILY 180 tablet 1  . montelukast (SINGULAIR) 10 MG tablet Take 10 mg by mouth every morning.     . ondansetron (ZOFRAN) 4 MG tablet Take 1 tablet (4 mg total) by mouth every 8 (eight) hours as needed for nausea or vomiting. 30 tablet 0  . traZODone (DESYREL) 50 MG tablet 1/2 to 1 tablet by mouth at bedtime as needed for sleep (Patient taking differently: Take 50 mg by mouth at bedtime. ) 30 tablet 0  . Turmeric 500 MG CAPS Take 1 capsule by mouth 2 (two) times daily.    . vitamin B-12 (CYANOCOBALAMIN) 500 MCG tablet Take 500 mcg by mouth daily.     No facility-administered medications prior to visit.      Per HPI unless specifically indicated in ROS section below Review of Systems  Constitutional: Negative for activity change, appetite change, chills, fatigue, fever and unexpected weight change.  HENT: Negative for hearing loss.   Eyes: Negative for visual disturbance.  Respiratory: Negative for cough, chest tightness, shortness of breath and wheezing.   Cardiovascular: Positive for leg swelling. Negative for chest pain and palpitations.  Gastrointestinal: Negative for abdominal distention, abdominal pain, blood in stool, constipation, diarrhea, nausea and vomiting.  Genitourinary: Negative for difficulty urinating and hematuria.  Musculoskeletal: Negative for arthralgias, myalgias and neck pain.  Skin: Negative for rash.  Neurological: Negative for dizziness, seizures, syncope and headaches.  Hematological: Negative for adenopathy. Does not bruise/bleed easily.  Psychiatric/Behavioral: Negative for dysphoric mood. The patient is not nervous/anxious.        Objective:    BP  116/70 (BP Location: Left Arm, Patient Position: Sitting, Cuff Size: Large)   Pulse (!) 58   Temp 97.8 F (36.6 C) (Oral)   Ht 5\' 9"  (1.753 m)   Wt 235 lb (106.6 kg)   SpO2 97%   BMI 34.70 kg/m   Wt Readings from Last 3 Encounters:  04/30/17 235 lb (106.6 kg)  03/19/17 237 lb (107.5 kg)  03/11/17 237 lb (107.5 kg)    Physical Exam  Constitutional: She is oriented to person, place, and time. She appears well-developed and well-nourished. No distress.  HENT:  Head: Normocephalic and atraumatic.  Right Ear: Hearing, tympanic membrane, external ear and ear canal normal.  Left Ear: Hearing, tympanic membrane, external ear and ear canal normal.  Nose: Nose normal.  Mouth/Throat: Uvula is midline, oropharynx is clear and moist and mucous membranes are normal. No oropharyngeal exudate, posterior oropharyngeal edema or posterior oropharyngeal erythema.  Eyes: Conjunctivae and EOM are normal. Pupils  are equal, round, and reactive to light. No scleral icterus.  Neck: Normal range of motion. Neck supple. No thyromegaly present.  Cardiovascular: Normal rate, regular rhythm, normal heart sounds and intact distal pulses.  No murmur heard. Pulses:      Radial pulses are 2+ on the right side, and 2+ on the left side.  Pulmonary/Chest: Effort normal and breath sounds normal. No respiratory distress. She has no wheezes. She has no rales.  Abdominal: Soft. Bowel sounds are normal. She exhibits no distension and no mass. There is no tenderness. There is no rebound and no guarding.  Musculoskeletal: Normal range of motion. She exhibits no edema.  Lymphadenopathy:    She has no cervical adenopathy.  Neurological: She is alert and oriented to person, place, and time.  CN grossly intact, station and gait intact  Skin: Skin is warm and dry. No rash noted.  Psychiatric: She has a normal mood and affect. Her behavior is normal. Judgment and thought content normal.  Nursing note and vitals  reviewed.  Results for orders placed or performed in visit on 04/23/17  T4, free  Result Value Ref Range   Free T4 0.94 0.60 - 1.60 ng/dL  CBC with Differential/Platelet  Result Value Ref Range   WBC 4.4 4.0 - 10.5 K/uL   RBC 4.16 3.87 - 5.11 Mil/uL   Hemoglobin 10.7 (L) 12.0 - 15.0 g/dL   HCT 13.0 (L) 17.5 - 12.0 %   MCV 81.2 78.0 - 100.0 fl   MCHC 31.6 30.0 - 36.0 g/dL   RDW 47.3 72.5 - 66.9 %   Platelets 253.0 150.0 - 400.0 K/uL   Neutrophils Relative % 55.3 43.0 - 77.0 %   Lymphocytes Relative 35.1 12.0 - 46.0 %   Monocytes Relative 6.8 3.0 - 12.0 %   Eosinophils Relative 2.4 0.0 - 5.0 %   Basophils Relative 0.4 0.0 - 3.0 %   Neutro Abs 2.4 1.4 - 7.7 K/uL   Lymphs Abs 1.6 0.7 - 4.0 K/uL   Monocytes Absolute 0.3 0.1 - 1.0 K/uL   Eosinophils Absolute 0.1 0.0 - 0.7 K/uL   Basophils Absolute 0.0 0.0 - 0.1 K/uL  TSH  Result Value Ref Range   TSH 1.41 0.35 - 4.50 uIU/mL  Comprehensive metabolic panel  Result Value Ref Range   Sodium 139 135 - 145 mEq/L   Potassium 4.6 3.5 - 5.1 mEq/L   Chloride 105 96 - 112 mEq/L   CO2 27 19 - 32 mEq/L   Glucose, Bld 98 70 - 99 mg/dL   BUN 12 6 - 23 mg/dL   Creatinine, Ser 0.50 0.40 - 1.20 mg/dL   Total Bilirubin 0.6 0.2 - 1.2 mg/dL   Alkaline Phosphatase 152 (H) 39 - 117 U/L   AST 18 0 - 37 U/L   ALT 14 0 - 35 U/L   Total Protein 6.7 6.0 - 8.3 g/dL   Albumin 4.1 3.5 - 5.2 g/dL   Calcium 9.0 8.4 - 39.9 mg/dL   GFR 71.37 >34.56 mL/min  Lipid panel  Result Value Ref Range   Cholesterol 172 0 - 200 mg/dL   Triglycerides 346.1 0.0 - 149.0 mg/dL   HDL 71.85 >41.53 mg/dL   VLDL 16.0 0.0 - 38.0 mg/dL   LDL Cholesterol 305 (H) 0 - 99 mg/dL   Total CHOL/HDL Ratio 4    NonHDL 128.39       Assessment & Plan:   Problem List Items Addressed This Visit    Depression  Chronic on high dose celexa and trazodone.  Will monitor effect of HRT prior to changing antidepressants.  PHQ9 = 19.       Elevated alkaline phosphatase level     Check GGT, PTH, vit D.       Relevant Orders   Gamma GT   VITAMIN D 25 Hydroxy (Vit-D Deficiency, Fractures)   Parathyroid hormone, intact (no Ca)   Healthcare maintenance - Primary    Preventative protocols reviewed and updated unless pt declined. Discussed healthy diet and lifestyle.       HTN (hypertension)    Chronic, stable. Continue current regimen.       Hypothyroidism, postsurgical    TFTs stable. Continue levothyroxine.       Iron deficiency anemia    Update labs. Has needed iron infusion in the past. Consider updated.       Relevant Orders   Ferritin   IBC panel   Lupus anticoagulant disorder (HCC)    ?of this - latest hematological evaluation was negative for this. Just recommending anticoagulation perioperatively.       Osteoarthritis   S/P gastric bypass    Update labs.       Relevant Orders   VITAMIN D 25 Hydroxy (Vit-D Deficiency, Fractures)   Vitamin B12   Vitamin B1   Folate       Follow up plan: Return in about 1 year (around 04/30/2018) for annual exam, prior fasting for blood work.  Eustaquio Boyden, MD

## 2017-04-30 NOTE — Assessment & Plan Note (Signed)
Chronic on high dose celexa and trazodone.  Will monitor effect of HRT prior to changing antidepressants.  PHQ9 = 19.

## 2017-04-30 NOTE — Assessment & Plan Note (Signed)
Preventative protocols reviewed and updated unless pt declined. Discussed healthy diet and lifestyle.  

## 2017-04-30 NOTE — Assessment & Plan Note (Signed)
Update labs. Has needed iron infusion in the past. Consider updated.

## 2017-04-30 NOTE — Patient Instructions (Addendum)
Sign release for records from latest hematology evaluation.  Labs today - to check iron panel.  Good to see you today.  Return as needed or in 1 year for next physical.   Health Maintenance, Female Adopting a healthy lifestyle and getting preventive care can go a long way to promote health and wellness. Talk with your health care provider about what schedule of regular examinations is right for you. This is a good chance for you to check in with your provider about disease prevention and staying healthy. In between checkups, there are plenty of things you can do on your own. Experts have done a lot of research about which lifestyle changes and preventive measures are most likely to keep you healthy. Ask your health care provider for more information. Weight and diet Eat a healthy diet  Be sure to include plenty of vegetables, fruits, low-fat dairy products, and lean protein.  Do not eat a lot of foods high in solid fats, added sugars, or salt.  Get regular exercise. This is one of the most important things you can do for your health. ? Most adults should exercise for at least 150 minutes each week. The exercise should increase your heart rate and make you sweat (moderate-intensity exercise). ? Most adults should also do strengthening exercises at least twice a week. This is in addition to the moderate-intensity exercise.  Maintain a healthy weight  Body mass index (BMI) is a measurement that can be used to identify possible weight problems. It estimates body fat based on height and weight. Your health care provider can help determine your BMI and help you achieve or maintain a healthy weight.  For females 89 years of age and older: ? A BMI below 18.5 is considered underweight. ? A BMI of 18.5 to 24.9 is normal. ? A BMI of 25 to 29.9 is considered overweight. ? A BMI of 30 and above is considered obese.  Watch levels of cholesterol and blood lipids  You should start having your blood  tested for lipids and cholesterol at 55 years of age, then have this test every 5 years.  You may need to have your cholesterol levels checked more often if: ? Your lipid or cholesterol levels are high. ? You are older than 55 years of age. ? You are at high risk for heart disease.  Cancer screening Lung Cancer  Lung cancer screening is recommended for adults 67-61 years old who are at high risk for lung cancer because of a history of smoking.  A yearly low-dose CT scan of the lungs is recommended for people who: ? Currently smoke. ? Have quit within the past 15 years. ? Have at least a 30-pack-year history of smoking. A pack year is smoking an average of one pack of cigarettes a day for 1 year.  Yearly screening should continue until it has been 15 years since you quit.  Yearly screening should stop if you develop a health problem that would prevent you from having lung cancer treatment.  Breast Cancer  Practice breast self-awareness. This means understanding how your breasts normally appear and feel.  It also means doing regular breast self-exams. Let your health care provider know about any changes, no matter how small.  If you are in your 20s or 30s, you should have a clinical breast exam (CBE) by a health care provider every 1-3 years as part of a regular health exam.  If you are 46 or older, have a CBE every year.  Also consider having a breast X-ray (mammogram) every year.  If you have a family history of breast cancer, talk to your health care provider about genetic screening.  If you are at high risk for breast cancer, talk to your health care provider about having an MRI and a mammogram every year.  Breast cancer gene (BRCA) assessment is recommended for women who have family members with BRCA-related cancers. BRCA-related cancers include: ? Breast. ? Ovarian. ? Tubal. ? Peritoneal cancers.  Results of the assessment will determine the need for genetic counseling and  BRCA1 and BRCA2 testing.  Cervical Cancer Your health care provider may recommend that you be screened regularly for cancer of the pelvic organs (ovaries, uterus, and vagina). This screening involves a pelvic examination, including checking for microscopic changes to the surface of your cervix (Pap test). You may be encouraged to have this screening done every 3 years, beginning at age 17.  For women ages 74-65, health care providers may recommend pelvic exams and Pap testing every 3 years, or they may recommend the Pap and pelvic exam, combined with testing for human papilloma virus (HPV), every 5 years. Some types of HPV increase your risk of cervical cancer. Testing for HPV may also be done on women of any age with unclear Pap test results.  Other health care providers may not recommend any screening for nonpregnant women who are considered low risk for pelvic cancer and who do not have symptoms. Ask your health care provider if a screening pelvic exam is right for you.  If you have had past treatment for cervical cancer or a condition that could lead to cancer, you need Pap tests and screening for cancer for at least 20 years after your treatment. If Pap tests have been discontinued, your risk factors (such as having a new sexual partner) need to be reassessed to determine if screening should resume. Some women have medical problems that increase the chance of getting cervical cancer. In these cases, your health care provider may recommend more frequent screening and Pap tests.  Colorectal Cancer  This type of cancer can be detected and often prevented.  Routine colorectal cancer screening usually begins at 55 years of age and continues through 55 years of age.  Your health care provider may recommend screening at an earlier age if you have risk factors for colon cancer.  Your health care provider may also recommend using home test kits to check for hidden blood in the stool.  A small camera  at the end of a tube can be used to examine your colon directly (sigmoidoscopy or colonoscopy). This is done to check for the earliest forms of colorectal cancer.  Routine screening usually begins at age 62.  Direct examination of the colon should be repeated every 5-10 years through 54 years of age. However, you may need to be screened more often if early forms of precancerous polyps or small growths are found.  Skin Cancer  Check your skin from head to toe regularly.  Tell your health care provider about any new moles or changes in moles, especially if there is a change in a mole's shape or color.  Also tell your health care provider if you have a mole that is larger than the size of a pencil eraser.  Always use sunscreen. Apply sunscreen liberally and repeatedly throughout the day.  Protect yourself by wearing long sleeves, pants, a wide-brimmed hat, and sunglasses whenever you are outside.  Heart disease, diabetes, and high  blood pressure  High blood pressure causes heart disease and increases the risk of stroke. High blood pressure is more likely to develop in: ? People who have blood pressure in the high end of the normal range (130-139/85-89 mm Hg). ? People who are overweight or obese. ? People who are African American.  If you are 78-1 years of age, have your blood pressure checked every 3-5 years. If you are 71 years of age or older, have your blood pressure checked every year. You should have your blood pressure measured twice-once when you are at a hospital or clinic, and once when you are not at a hospital or clinic. Record the average of the two measurements. To check your blood pressure when you are not at a hospital or clinic, you can use: ? An automated blood pressure machine at a pharmacy. ? A home blood pressure monitor.  If you are between 55 years and 80 years old, ask your health care provider if you should take aspirin to prevent strokes.  Have regular diabetes  screenings. This involves taking a blood sample to check your fasting blood sugar level. ? If you are at a normal weight and have a low risk for diabetes, have this test once every three years after 55 years of age. ? If you are overweight and have a high risk for diabetes, consider being tested at a younger age or more often. Preventing infection Hepatitis B  If you have a higher risk for hepatitis B, you should be screened for this virus. You are considered at high risk for hepatitis B if: ? You were born in a country where hepatitis B is common. Ask your health care provider which countries are considered high risk. ? Your parents were born in a high-risk country, and you have not been immunized against hepatitis B (hepatitis B vaccine). ? You have HIV or AIDS. ? You use needles to inject street drugs. ? You live with someone who has hepatitis B. ? You have had sex with someone who has hepatitis B. ? You get hemodialysis treatment. ? You take certain medicines for conditions, including cancer, organ transplantation, and autoimmune conditions.  Hepatitis C  Blood testing is recommended for: ? Everyone born from [birth year] through 1964. ? Anyone with known risk factors for hepatitis C.  Sexually transmitted infections (STIs)  You should be screened for sexually transmitted infections (STIs) including gonorrhea and chlamydia if: ? You are sexually active and are younger than 55 years of age. ? You are older than 55 years of age and your health care provider tells you that you are at risk for this type of infection. ? Your sexual activity has changed since you were last screened and you are at an increased risk for chlamydia or gonorrhea. Ask your health care provider if you are at risk.  If you do not have HIV, but are at risk, it may be recommended that you take a prescription medicine daily to prevent HIV infection. This is called pre-exposure prophylaxis (PrEP). You are considered at risk  if: ? You are sexually active and do not regularly use condoms or know the HIV status of your partner(s). ? You take drugs by injection. ? You are sexually active with a partner who has HIV.  Talk with your health care provider about whether you are at high risk of being infected with HIV. If you choose to begin PrEP, you should first be tested for HIV. You should then be tested  every 3 months for as long as you are taking PrEP. Pregnancy  If you are premenopausal and you may become pregnant, ask your health care provider about preconception counseling.  If you may become pregnant, take 400 to 800 micrograms (mcg) of folic acid every day.  If you want to prevent pregnancy, talk to your health care provider about birth control (contraception). Osteoporosis and menopause  Osteoporosis is a disease in which the bones lose minerals and strength with aging. This can result in serious bone fractures. Your risk for osteoporosis can be identified using a bone density scan.  If you are 33 years of age or older, or if you are at risk for osteoporosis and fractures, ask your health care provider if you should be screened.  Ask your health care provider whether you should take a calcium or vitamin D supplement to lower your risk for osteoporosis.  Menopause may have certain physical symptoms and risks.  Hormone replacement therapy may reduce some of these symptoms and risks. Talk to your health care provider about whether hormone replacement therapy is right for you. Follow these instructions at home:  Schedule regular health, dental, and eye exams.  Stay current with your immunizations.  Do not use any tobacco products including cigarettes, chewing tobacco, or electronic cigarettes.  If you are pregnant, do not drink alcohol.  If you are breastfeeding, limit how much and how often you drink alcohol.  Limit alcohol intake to no more than 1 drink per day for nonpregnant women. One drink  equals 12 ounces of beer, 5 ounces of wine, or 1 ounces of hard liquor.  Do not use street drugs.  Do not share needles.  Ask your health care provider for help if you need support or information about quitting drugs.  Tell your health care provider if you often feel depressed.  Tell your health care provider if you have ever been abused or do not feel safe at home. This information is not intended to replace advice given to you by your health care provider. Make sure you discuss any questions you have with your health care provider. Document Released: 12/16/2010 Document Revised: 11/08/2015 Document Reviewed: 03/06/2015 Elsevier Interactive Patient Education  [employer].

## 2017-04-30 NOTE — Addendum Note (Signed)
Addended by: Alvina Chou on: 04/30/2017 04:38 PM   Modules accepted: Orders

## 2017-04-30 NOTE — Assessment & Plan Note (Signed)
Check GGT, PTH, vit D.

## 2017-05-01 ENCOUNTER — Other Ambulatory Visit
Admission: RE | Admit: 2017-05-01 | Discharge: 2017-05-01 | Disposition: A | Discharge status: Home / Self Care | Payer: BLUE CROSS/BLUE SHIELD | Source: Ambulatory Visit | Attending: Certified Registered Nurse Anesthetist | Admitting: Certified Registered Nurse Anesthetist

## 2017-05-01 DIAGNOSIS — D509 Iron deficiency anemia, unspecified: Secondary | ICD-10-CM | POA: Insufficient documentation

## 2017-05-01 DIAGNOSIS — Z9884 Bariatric surgery status: Secondary | ICD-10-CM | POA: Diagnosis not present

## 2017-05-01 DIAGNOSIS — Z96642 Presence of left artificial hip joint: Secondary | ICD-10-CM | POA: Diagnosis not present

## 2017-05-01 LAB — PARATHYROID HORMONE, INTACT (NO CA): PTH: 124 pg/mL — AB (ref 14–64)

## 2017-05-01 NOTE — Addendum Note (Signed)
Addended by: Nanci Pina on: 05/01/2017 12:37 PM   Modules accepted: Orders

## 2017-05-02 ENCOUNTER — Encounter: Payer: Self-pay | Admitting: Family Medicine

## 2017-05-02 ENCOUNTER — Other Ambulatory Visit: Payer: Self-pay | Admitting: Family Medicine

## 2017-05-02 DIAGNOSIS — Z9884 Bariatric surgery status: Secondary | ICD-10-CM

## 2017-05-02 DIAGNOSIS — N182 Chronic kidney disease, stage 2 (mild): Secondary | ICD-10-CM | POA: Insufficient documentation

## 2017-05-02 DIAGNOSIS — R748 Abnormal levels of other serum enzymes: Secondary | ICD-10-CM

## 2017-05-02 DIAGNOSIS — E211 Secondary hyperparathyroidism, not elsewhere classified: Secondary | ICD-10-CM

## 2017-05-02 MED ORDER — CALCIUM CARBONATE 600 MG PO TABS: 600.0000 mg | ORAL_TABLET | Freq: Two times a day (BID) | ORAL | Status: DC

## 2017-05-03 LAB — VITAMIN B1: VITAMIN B1 (THIAMINE): 76.7 nmol/L (ref 66.5–200.0)

## 2017-05-12 ENCOUNTER — Other Ambulatory Visit: Payer: Self-pay | Admitting: Family Medicine

## 2017-05-14 NOTE — Telephone Encounter (Signed)
plz set patient up for iron infusion  Feraheme 510mg  x1, repeat in 1 week.  Come in to office for labwork 1 month after second infusion.  indication iron deficiency anemia and fatigue.

## 2017-05-15 NOTE — Telephone Encounter (Signed)
Pt requests [facility].

## 2017-05-21 ENCOUNTER — Telehealth: Payer: Self-pay | Admitting: Family Medicine

## 2017-05-21 ENCOUNTER — Telehealth: Payer: Self-pay

## 2017-05-21 DIAGNOSIS — R232 Flushing: Secondary | ICD-10-CM | POA: Diagnosis not present

## 2017-05-21 DIAGNOSIS — N952 Postmenopausal atrophic vaginitis: Secondary | ICD-10-CM | POA: Diagnosis not present

## 2017-05-21 NOTE — Telephone Encounter (Signed)
Left message on vm at [facility] at [phone] to call back to schedule iron infusion or let us know if this is correct place to set it up. Also, we need fax number to send pt info and order. [See Pt email, 05/12/17]

## 2017-05-21 NOTE — Telephone Encounter (Signed)
See TE, 05/21/17.

## 2017-05-21 NOTE — Telephone Encounter (Signed)
Contacted by Laretta Alstrom at [facility] regarding See assessment per Dr Sharen Hones on 04/30/17 and Nanci Pina, CMA dated 05/21/17; per Lafonda Mosses, pt is not a patient in their office and that she will need to be set up as a new patient, and will also require a referral; will route to [facility] pool; Lafonda Mosses can be reached at [phone].

## 2017-05-21 NOTE — Telephone Encounter (Signed)
Called patient and she just wants to go to [facility] and get infusion there. Appt made for 05/28/17 at 10:00am, order faxed and patient aware.

## 2017-05-21 NOTE — Telephone Encounter (Signed)
Noted  

## 2017-05-21 NOTE — Telephone Encounter (Signed)
Left message on vm at [facility] at [phone] to call back to schedule iron infusion or let us know if this is correct place to set it up. Also, we need fax number to send pt info and order.

## 2017-05-21 NOTE — Telephone Encounter (Addendum)
Would touch base with patient re [facility] stay vs she may need to return to [facility] for discussion otherwise can't do short stay at [facility].  Thank you

## 2017-05-26 ENCOUNTER — Other Ambulatory Visit (HOSPITAL_COMMUNITY): Payer: Self-pay | Admitting: *Deleted

## 2017-05-28 ENCOUNTER — Ambulatory Visit (HOSPITAL_COMMUNITY)
Admission: RE | Admit: 2017-05-28 | Discharge: 2017-05-28 | Disposition: A | Discharge status: Home / Self Care | Payer: BLUE CROSS/BLUE SHIELD | Source: Ambulatory Visit | Attending: Family Medicine | Admitting: Family Medicine

## 2017-05-28 DIAGNOSIS — R5383 Other fatigue: Secondary | ICD-10-CM | POA: Insufficient documentation

## 2017-05-28 DIAGNOSIS — D509 Iron deficiency anemia, unspecified: Secondary | ICD-10-CM | POA: Diagnosis not present

## 2017-05-28 MED ORDER — SODIUM CHLORIDE 0.9 % IV SOLN
510.0000 mg | INTRAVENOUS | Status: DC
  Administered 2017-05-28: 510 mg via INTRAVENOUS
  Filled 2017-05-28: qty 17

## 2017-05-31 ENCOUNTER — Encounter: Payer: Self-pay | Admitting: Family Medicine

## 2017-06-01 ENCOUNTER — Encounter: Payer: Self-pay | Admitting: Family Medicine

## 2017-06-01 ENCOUNTER — Ambulatory Visit (INDEPENDENT_AMBULATORY_CARE_PROVIDER_SITE_OTHER): Payer: BLUE CROSS/BLUE SHIELD | Admitting: Family Medicine

## 2017-06-01 VITALS — BP 120/78 | HR 59 | Temp 98.4°F | Wt 232.0 lb

## 2017-06-01 DIAGNOSIS — J208 Acute bronchitis due to other specified organisms: Secondary | ICD-10-CM | POA: Diagnosis not present

## 2017-06-01 DIAGNOSIS — J4520 Mild intermittent asthma, uncomplicated: Secondary | ICD-10-CM | POA: Diagnosis not present

## 2017-06-01 DIAGNOSIS — B9689 Other specified bacterial agents as the cause of diseases classified elsewhere: Secondary | ICD-10-CM

## 2017-06-01 MED ORDER — ALBUTEROL SULFATE HFA 108 (90 BASE) MCG/ACT IN AERS: 2.0000 | INHALATION_SPRAY | Freq: Four times a day (QID) | RESPIRATORY_TRACT | 3 refills | Status: DC | PRN

## 2017-06-01 MED ORDER — DOXYCYCLINE HYCLATE 100 MG PO TABS: 100.0000 mg | ORAL_TABLET | Freq: Two times a day (BID) | ORAL | 0 refills | Status: DC

## 2017-06-01 MED ORDER — BENZONATATE 100 MG PO CAPS: 100.0000 mg | ORAL_CAPSULE | Freq: Three times a day (TID) | ORAL | 0 refills | Status: DC | PRN

## 2017-06-01 MED ORDER — FLUTICASONE-SALMETEROL 100-50 MCG/DOSE IN AEPB: 1.0000 | INHALATION_SPRAY | Freq: Two times a day (BID) | RESPIRATORY_TRACT | 11 refills | Status: DC

## 2017-06-01 NOTE — Assessment & Plan Note (Addendum)
Albuterol and advair refilled per pt request.

## 2017-06-01 NOTE — Patient Instructions (Addendum)
I think you have bacterial bronchitis - treat with doxycycline 10 day course with meals.  May use plain mucinex with plenty of water.  Push fluids and rest.  Let us know if not improving with treatment.

## 2017-06-01 NOTE — Progress Notes (Signed)
BP 120/78 (BP Location: Left Arm, Patient Position: Sitting, Cuff Size: Large)   Pulse (!) 59   Temp 98.4 F (36.9 C) (Oral)   Wt 232 lb (105.2 kg)   SpO2 97%   BMI 34.26 kg/m    CC: productive cough Subjective:    Patient ID: Claire Strong, female    DOB: 1960-08-09, 56 y.o.   MRN: [id number]  HPI: Claire Strong is a 56 y.o. female presenting on 06/01/2017 for Cough (Productive with green mucous, worse at night and first thing in AM. Has had for >1 mo. Had improved but now is back. Disturbing sleep. Using inhaler. )   1 mo h/o productive cough of green sputum, initially improved but sxs recurred 4 days ago. Worse at night time. Trouble sleeping. Tightness in chest, without dyspnea. Some nausea. Post nasal drainage. Chest and head congestion. Headaches present. Trouble sleeping the past 3 nights.   No fevers, ear or tooth, ST.   Continues advair. Requests albuterol refill.  No sick contacts at home.   First iron infusion last week.   Relevant past medical, surgical, family and social history reviewed and updated as indicated. Interim medical history since our last visit reviewed. Allergies and medications reviewed and updated. Outpatient Medications Prior to Visit  Medication Sig Dispense Refill  . acetaminophen (TYLENOL) 500 MG tablet Take 1,000 mg by mouth 2 (two) times daily.     . ACETAMINOPHEN-BUTALBITAL 50-325 MG TABS Take 1 tablet by mouth at bedtime as needed (sleep). Reported on 09/03/2015 3 times a week    . apixaban (ELIQUIS) 5 MG TABS tablet Take 1 tablet (5 mg total) by mouth 2 (two) times daily. 60 tablet 1  . aspirin 81 MG tablet Take 81 mg by mouth daily.    . benazepril (LOTENSIN) 10 MG tablet TAKE ONE TABLET BY MOUTH ONCE DAILY (Patient taking differently: TAKE ONE TABLET BY MOUTH ONCE DAILY AT BEDTIME) 90 tablet 3  . calcium carbonate (CALCIUM 600) 600 MG TABS tablet Take 1 tablet (600 mg total) 2 (two) times daily with a meal by mouth. 60 tablet   .  carisoprodol (SOMA) 350 MG tablet TAKE ONE TABLET BY MOUTH AT BEDTIME 90 tablet 1  . cetirizine (ZYRTEC) 10 MG tablet Take 10 mg by mouth daily.    . Cholecalciferol (VITAMIN D3) 1000 UNITS tablet Take 2,000 Units by mouth daily.     . citalopram (CELEXA) 40 MG tablet TAKE ONE TABLET BY MOUTH ONCE DAILY 90 tablet 3  . EPINEPHrine (EPI-PEN) 0.3 mg/0.3 mL DEVI Inject 0.3 mLs (0.3 mg total) into the muscle once. (Patient taking differently: Inject 0.3 mg into the muscle once. ) 1 Device 2  . estradiol (ESTRACE) 1 MG tablet Take 1 tablet daily by mouth.  0  . fluconazole (DIFLUCAN) 100 MG tablet Take 1 tablet (100 mg total) by mouth daily. 6 tablet 0  . furosemide (LASIX) 20 MG tablet TAKE ONE TABLET BY MOUTH ONCE DAILY 90 tablet 0  . gabapentin (NEURONTIN) 300 MG capsule Take 1 capsule (300 mg total) by mouth 3 (three) times a week. 12 capsule 11  . HYDROmorphone (DILAUDID) 2 MG tablet Take 1 tablet (2 mg total) by mouth every 6 (six) hours as needed for severe pain (takes along with Zofran). 30 tablet 0  . levothyroxine (SYNTHROID, LEVOTHROID) 150 MCG tablet Take 150 mcg by mouth daily before breakfast.    . metoprolol tartrate (LOPRESSOR) 25 MG tablet TAKE ONE TABLET BY MOUTH TWICE DAILY  180 tablet 1  . montelukast (SINGULAIR) 10 MG tablet Take 10 mg by mouth every morning.     . ondansetron (ZOFRAN) 4 MG tablet Take 1 tablet (4 mg total) by mouth every 8 (eight) hours as needed for nausea or vomiting. 30 tablet 0  . traZODone (DESYREL) 50 MG tablet 1/2 to 1 tablet by mouth at bedtime as needed for sleep (Patient taking differently: Take 50 mg by mouth at bedtime. ) 30 tablet 0  . traZODone (DESYREL) 50 MG tablet TAKE 1/2 TO 1 (ONE-HALF TO ONE) TABLET BY MOUTH AT BEDTIME AS NEEDED FOR SLEEP 30 tablet 5  . Turmeric 500 MG CAPS Take 1 capsule by mouth 2 (two) times daily.    . vitamin B-12 (CYANOCOBALAMIN) 500 MCG tablet Take 500 mcg by mouth daily.    Marland Kitchen albuterol (PROVENTIL HFA;VENTOLIN HFA) 108 (90  BASE) MCG/ACT inhaler Inhale 2 puffs into the lungs every 6 (six) hours as needed. (Patient taking differently: Inhale 2 puffs into the lungs every 6 (six) hours as needed for wheezing or shortness of breath. ) 1 Inhaler 11  . Fluticasone-Salmeterol (ADVAIR DISKUS) 100-50 MCG/DOSE AEPB Inhale 1 puff into the lungs every 12 (twelve) hours. (Patient taking differently: Inhale 1 puff into the lungs 2 (two) times daily as needed. ) 60 each 3   No facility-administered medications prior to visit.      Per HPI unless specifically indicated in ROS section below Review of Systems     Objective:    BP 120/78 (BP Location: Left Arm, Patient Position: Sitting, Cuff Size: Large)   Pulse (!) 59   Temp 98.4 F (36.9 C) (Oral)   Wt 232 lb (105.2 kg)   SpO2 97%   BMI 34.26 kg/m   Wt Readings from Last 3 Encounters:  06/01/17 232 lb (105.2 kg)  05/28/17 229 lb (103.9 kg)  04/30/17 235 lb (106.6 kg)    Physical Exam  Constitutional: She appears well-developed and well-nourished. No distress.  HENT:  Head: Normocephalic and atraumatic.  Right Ear: Hearing, tympanic membrane, external ear and ear canal normal.  Left Ear: Hearing, tympanic membrane, external ear and ear canal normal.  Nose: Mucosal edema (congested nasal mucosa) present. No rhinorrhea. Right sinus exhibits maxillary sinus tenderness. Right sinus exhibits no frontal sinus tenderness. Left sinus exhibits maxillary sinus tenderness. Left sinus exhibits no frontal sinus tenderness.  Mouth/Throat: Uvula is midline, oropharynx is clear and moist and mucous membranes are normal. No oropharyngeal exudate, posterior oropharyngeal edema, posterior oropharyngeal erythema or tonsillar abscesses.  Chronic nasal septal perforation  Eyes: Conjunctivae and EOM are normal. Pupils are equal, round, and reactive to light. No scleral icterus.  Neck: Normal range of motion. Neck supple. No thyromegaly present.  Cardiovascular: Normal rate, regular rhythm,  normal heart sounds and intact distal pulses.  No murmur heard. Pulmonary/Chest: Effort normal and breath sounds normal. No respiratory distress. She has no wheezes. She has no rales.  Lungs clear  Lymphadenopathy:    She has no cervical adenopathy.  Skin: Skin is warm and dry. No rash noted.  Nursing note and vitals reviewed.  Results for orders placed or performed during the hospital encounter of 05/01/17  Vitamin B1  Result Value Ref Range   Vitamin B1 (Thiamine) 76.7 66.5 - 200.0 nmol/L      Assessment & Plan:   Problem List Items Addressed This Visit    Acute bacterial bronchitis - Primary    Anticipate bacterial given recurrence after initial improvement. Treat with  doxycycline abx - pt states zpack ineffective in the past. Further supportive care as per instructions. Pt states tessalon perls effective. Update if not improving with treatment. Pt agrees with plan.       Bronchial asthma    Albuterol and advair refilled per pt request.       Relevant Medications   albuterol (PROVENTIL HFA;VENTOLIN HFA) 108 (90 Base) MCG/ACT inhaler   Fluticasone-Salmeterol (ADVAIR DISKUS) 100-50 MCG/DOSE AEPB       Follow up plan: Return if symptoms worsen or fail to improve.  Eustaquio Boyden, MD

## 2017-06-01 NOTE — Telephone Encounter (Signed)
Seen today. 

## 2017-06-01 NOTE — Assessment & Plan Note (Signed)
Anticipate bacterial given recurrence after initial improvement. Treat with doxycycline abx - pt states zpack ineffective in the past. Further supportive care as per instructions. Pt states tessalon perls effective. Update if not improving with treatment. Pt agrees with plan.

## 2017-06-04 ENCOUNTER — Ambulatory Visit (HOSPITAL_COMMUNITY)
Admission: RE | Admit: 2017-06-04 | Discharge: 2017-06-04 | Disposition: A | Discharge status: Home / Self Care | Payer: BLUE CROSS/BLUE SHIELD | Source: Ambulatory Visit | Attending: Family Medicine | Admitting: Family Medicine

## 2017-06-04 DIAGNOSIS — D509 Iron deficiency anemia, unspecified: Secondary | ICD-10-CM | POA: Diagnosis not present

## 2017-06-04 DIAGNOSIS — R5383 Other fatigue: Secondary | ICD-10-CM | POA: Insufficient documentation

## 2017-06-04 MED ORDER — SODIUM CHLORIDE 0.9 % IV SOLN
510.0000 mg | INTRAVENOUS | Status: DC
  Administered 2017-06-04: 510 mg via INTRAVENOUS
  Filled 2017-06-04: qty 17

## 2017-06-10 ENCOUNTER — Other Ambulatory Visit: Payer: Self-pay | Admitting: Family Medicine

## 2017-06-18 ENCOUNTER — Telehealth: Payer: Self-pay | Admitting: Family Medicine

## 2017-06-18 DIAGNOSIS — D509 Iron deficiency anemia, unspecified: Secondary | ICD-10-CM

## 2017-06-18 NOTE — Telephone Encounter (Signed)
Copied from CRM [id number]. Topic: General - Other >> Jun 18, 2017  9:41 AM Gerrianne Scale wrote: Reason for CRM: patient calling to let her provider know that three days after her Iron infusion she stated feeling fatigued and wanted to know if she need another infusion

## 2017-06-18 NOTE — Telephone Encounter (Signed)
She had 2 iron infusions last month, right? Recommend wait until repeat labs after latest infusion then reassess. plz schedule lab visit in 2 wks to assess.

## 2017-06-18 NOTE — Telephone Encounter (Signed)
Spoke with pt relaying message per Dr. Reece Agar.   Says the effects of both infusions only lasted about 3 days.  But she will call back to schedule labs in 2 wks.

## 2017-06-28 ENCOUNTER — Other Ambulatory Visit: Payer: Self-pay | Admitting: Family Medicine

## 2017-07-13 DIAGNOSIS — M79672 Pain in left foot: Secondary | ICD-10-CM | POA: Diagnosis not present

## 2017-07-13 DIAGNOSIS — S9032XA Contusion of left foot, initial encounter: Secondary | ICD-10-CM | POA: Diagnosis not present

## 2017-07-19 ENCOUNTER — Other Ambulatory Visit: Payer: Self-pay | Admitting: Family Medicine

## 2017-07-23 ENCOUNTER — Telehealth: Payer: Self-pay | Admitting: Cardiovascular Disease

## 2017-07-23 NOTE — Telephone Encounter (Signed)
Received records request [employer], forwarded to [facility] for processing.

## 2017-07-30 ENCOUNTER — Ambulatory Visit
Admission: RE | Admit: 2017-07-30 | Discharge: 2017-07-30 | Disposition: A | Discharge status: Home / Self Care | Payer: BLUE CROSS/BLUE SHIELD | Source: Ambulatory Visit | Attending: Family Medicine | Admitting: Family Medicine

## 2017-07-30 DIAGNOSIS — R748 Abnormal levels of other serum enzymes: Secondary | ICD-10-CM | POA: Diagnosis not present

## 2017-07-30 DIAGNOSIS — E211 Secondary hyperparathyroidism, not elsewhere classified: Secondary | ICD-10-CM | POA: Insufficient documentation

## 2017-07-30 DIAGNOSIS — Z9884 Bariatric surgery status: Secondary | ICD-10-CM | POA: Diagnosis not present

## 2017-07-30 DIAGNOSIS — Z780 Asymptomatic menopausal state: Secondary | ICD-10-CM | POA: Diagnosis not present

## 2017-07-30 DIAGNOSIS — Z13820 Encounter for screening for osteoporosis: Secondary | ICD-10-CM | POA: Diagnosis not present

## 2017-08-14 ENCOUNTER — Other Ambulatory Visit: Payer: Self-pay | Admitting: Family Medicine

## 2017-08-14 NOTE — Telephone Encounter (Signed)
Eprescribed.

## 2017-08-14 NOTE — Telephone Encounter (Signed)
Last filled:  05/21/17, #90 Last OV:  06/01/17 Next OV:  none

## 2017-09-01 DIAGNOSIS — M5136 Other intervertebral disc degeneration, lumbar region: Secondary | ICD-10-CM | POA: Diagnosis not present

## 2017-09-01 DIAGNOSIS — M5416 Radiculopathy, lumbar region: Secondary | ICD-10-CM | POA: Diagnosis not present

## 2017-09-07 ENCOUNTER — Encounter: Payer: Self-pay | Admitting: Family Medicine

## 2017-09-15 DIAGNOSIS — M5136 Other intervertebral disc degeneration, lumbar region: Secondary | ICD-10-CM | POA: Diagnosis not present

## 2017-09-15 DIAGNOSIS — M5416 Radiculopathy, lumbar region: Secondary | ICD-10-CM | POA: Diagnosis not present

## 2017-09-19 ENCOUNTER — Encounter: Payer: Self-pay | Admitting: Family Medicine

## 2017-09-25 ENCOUNTER — Other Ambulatory Visit: Payer: Self-pay | Admitting: Obstetrics and Gynecology

## 2017-09-25 DIAGNOSIS — Z1231 Encounter for screening mammogram for malignant neoplasm of breast: Secondary | ICD-10-CM

## 2017-09-26 ENCOUNTER — Other Ambulatory Visit: Payer: Self-pay | Admitting: Family Medicine

## 2017-09-29 ENCOUNTER — Other Ambulatory Visit
Admission: RE | Admit: 2017-09-29 | Discharge: 2017-09-29 | Disposition: A | Discharge status: Home / Self Care | Payer: BLUE CROSS/BLUE SHIELD | Source: Ambulatory Visit | Attending: Otolaryngology | Admitting: Otolaryngology

## 2017-09-29 DIAGNOSIS — E039 Hypothyroidism, unspecified: Secondary | ICD-10-CM | POA: Diagnosis not present

## 2017-09-29 LAB — T4, FREE: FREE T4: 0.79 ng/dL (ref 0.61–1.12)

## 2017-09-29 LAB — TSH: TSH: 1.91 u[IU]/mL (ref 0.350–4.500)

## 2017-09-30 LAB — T3, FREE: T3, Free: 2.1 pg/mL (ref 2.0–4.4)

## 2017-10-01 ENCOUNTER — Ambulatory Visit
Admission: RE | Admit: 2017-10-01 | Discharge: 2017-10-01 | Disposition: A | Discharge status: Home / Self Care | Payer: BLUE CROSS/BLUE SHIELD | Source: Ambulatory Visit | Attending: Obstetrics and Gynecology | Admitting: Obstetrics and Gynecology

## 2017-10-01 DIAGNOSIS — Z1231 Encounter for screening mammogram for malignant neoplasm of breast: Secondary | ICD-10-CM | POA: Diagnosis not present

## 2017-10-06 DIAGNOSIS — J3489 Other specified disorders of nose and nasal sinuses: Secondary | ICD-10-CM | POA: Diagnosis not present

## 2017-10-06 DIAGNOSIS — E039 Hypothyroidism, unspecified: Secondary | ICD-10-CM | POA: Diagnosis not present

## 2017-10-14 DIAGNOSIS — M5136 Other intervertebral disc degeneration, lumbar region: Secondary | ICD-10-CM | POA: Diagnosis not present

## 2017-10-14 DIAGNOSIS — M5416 Radiculopathy, lumbar region: Secondary | ICD-10-CM | POA: Diagnosis not present

## 2017-10-16 DIAGNOSIS — M1990 Unspecified osteoarthritis, unspecified site: Secondary | ICD-10-CM | POA: Diagnosis not present

## 2017-10-16 DIAGNOSIS — S6991XA Unspecified injury of right wrist, hand and finger(s), initial encounter: Secondary | ICD-10-CM | POA: Diagnosis not present

## 2017-10-16 DIAGNOSIS — I10 Essential (primary) hypertension: Secondary | ICD-10-CM | POA: Diagnosis not present

## 2017-10-16 DIAGNOSIS — M79641 Pain in right hand: Secondary | ICD-10-CM | POA: Diagnosis not present

## 2017-10-16 DIAGNOSIS — Z87891 Personal history of nicotine dependence: Secondary | ICD-10-CM | POA: Diagnosis not present

## 2017-10-16 DIAGNOSIS — Z79890 Hormone replacement therapy (postmenopausal): Secondary | ICD-10-CM | POA: Diagnosis not present

## 2017-10-16 DIAGNOSIS — E039 Hypothyroidism, unspecified: Secondary | ICD-10-CM | POA: Diagnosis not present

## 2017-10-16 DIAGNOSIS — S62346A Nondisplaced fracture of base of fifth metacarpal bone, right hand, initial encounter for closed fracture: Secondary | ICD-10-CM | POA: Diagnosis not present

## 2017-10-16 DIAGNOSIS — Y92481 Parking lot as the place of occurrence of the external cause: Secondary | ICD-10-CM | POA: Diagnosis not present

## 2017-10-16 DIAGNOSIS — W1830XA Fall on same level, unspecified, initial encounter: Secondary | ICD-10-CM | POA: Diagnosis not present

## 2017-10-20 DIAGNOSIS — Z859 Personal history of malignant neoplasm, unspecified: Secondary | ICD-10-CM | POA: Diagnosis not present

## 2017-10-20 DIAGNOSIS — Z85828 Personal history of other malignant neoplasm of skin: Secondary | ICD-10-CM | POA: Diagnosis not present

## 2017-10-20 DIAGNOSIS — L578 Other skin changes due to chronic exposure to nonionizing radiation: Secondary | ICD-10-CM | POA: Diagnosis not present

## 2017-10-20 DIAGNOSIS — Z86018 Personal history of other benign neoplasm: Secondary | ICD-10-CM | POA: Diagnosis not present

## 2017-10-20 DIAGNOSIS — S62306A Unspecified fracture of fifth metacarpal bone, right hand, initial encounter for closed fracture: Secondary | ICD-10-CM | POA: Diagnosis not present

## 2017-10-29 DIAGNOSIS — S62306D Unspecified fracture of fifth metacarpal bone, right hand, subsequent encounter for fracture with routine healing: Secondary | ICD-10-CM | POA: Diagnosis not present

## 2017-11-03 ENCOUNTER — Ambulatory Visit (INDEPENDENT_AMBULATORY_CARE_PROVIDER_SITE_OTHER): Payer: BLUE CROSS/BLUE SHIELD | Admitting: Family Medicine

## 2017-11-03 ENCOUNTER — Encounter: Payer: Self-pay | Admitting: Family Medicine

## 2017-11-03 VITALS — BP 118/82 | HR 71 | Temp 98.2°F | Resp 16 | Ht 69.0 in | Wt 233.0 lb

## 2017-11-03 DIAGNOSIS — E890 Postprocedural hypothyroidism: Secondary | ICD-10-CM | POA: Diagnosis not present

## 2017-11-03 DIAGNOSIS — Q631 Lobulated, fused and horseshoe kidney: Secondary | ICD-10-CM

## 2017-11-03 DIAGNOSIS — J4520 Mild intermittent asthma, uncomplicated: Secondary | ICD-10-CM

## 2017-11-03 DIAGNOSIS — N182 Chronic kidney disease, stage 2 (mild): Secondary | ICD-10-CM

## 2017-11-03 DIAGNOSIS — E211 Secondary hyperparathyroidism, not elsewhere classified: Secondary | ICD-10-CM | POA: Diagnosis not present

## 2017-11-03 DIAGNOSIS — D6862 Lupus anticoagulant syndrome: Secondary | ICD-10-CM

## 2017-11-03 DIAGNOSIS — Z9884 Bariatric surgery status: Secondary | ICD-10-CM | POA: Diagnosis not present

## 2017-11-03 DIAGNOSIS — I10 Essential (primary) hypertension: Secondary | ICD-10-CM | POA: Diagnosis not present

## 2017-11-03 DIAGNOSIS — F3341 Major depressive disorder, recurrent, in partial remission: Secondary | ICD-10-CM

## 2017-11-03 DIAGNOSIS — M797 Fibromyalgia: Secondary | ICD-10-CM

## 2017-11-03 DIAGNOSIS — M8949 Other hypertrophic osteoarthropathy, multiple sites: Secondary | ICD-10-CM

## 2017-11-03 DIAGNOSIS — D509 Iron deficiency anemia, unspecified: Secondary | ICD-10-CM

## 2017-11-03 DIAGNOSIS — Z86718 Personal history of other venous thrombosis and embolism: Secondary | ICD-10-CM

## 2017-11-03 DIAGNOSIS — M150 Primary generalized (osteo)arthritis: Secondary | ICD-10-CM

## 2017-11-03 DIAGNOSIS — M159 Polyosteoarthritis, unspecified: Secondary | ICD-10-CM

## 2017-11-03 DIAGNOSIS — Z87442 Personal history of urinary calculi: Secondary | ICD-10-CM | POA: Diagnosis not present

## 2017-11-03 NOTE — Progress Notes (Signed)
Patient: Claire Strong, Female    DOB: 1961-06-10, 57 y.o.   MRN: [id number] Visit Date: 11/03/2017  Today's Provider: Lavon Paganini, MD   I, Martha Clan, CMA, am acting as scribe for Lavon Paganini, MD.  Chief Complaint  Patient presents with  . Establish Care   Subjective:    Establish Care Claire Strong is a 57 y.o. female who presents today to establish care. She feels fairly well. She is c/o arthritic pain. She reports exercising none due to pain. She reports she is sleeping well, as long as she takes muscle relaxers and trazodone.  Patient was previously being seen at [facility] by Dr. Danise Mina.  She and her husband wanted to be seen in a clinic closer to home.  Patient has a history of melanoma and basal cell carcinoma.  She is followed by dermatology, Dr. Phillip Heal, annually for skin checks.  She states she recently had a skin check and everything was clear and she did not need any biopsies.  She sees Dr. Leafy Ro for her gynecologic needs.  She is status post hysterectomy and does not have a cervix.  She does not need Pap smears.  She is taking daily estrogen therapy for hormone replacement purposes.  Patient has a right horseshoe kidney and a history of kidney stones.  She has had lithotripsy with complications in the past.  She is followed regularly by [facility]).  Patient is followed regularly by [facility].  She is status post left total knee replacement and bilateral total hip replacements.  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  Lupus anticoagulant disorder - states she saw Hematologist before last hip surgery and was told that she no longer had this.  She is confused because she has previously been told that she does have this issue.  She takes a daily baby aspirin.  She is not on regular anticoagulation due to history of a bleeding gastric ulcer.  She has history of multiple DVTs.  She does  take anticoagulation, such as Eliquis, when she is having orthopedic surgeries.  H/o gastric bypass: This was more than 10 years ago.  She has been released from her surgeon's follow-up.  She states that her previous PCP was checking her labs annually.  She is taking supplements of B12, vitamin D, calcium.  She is not taking a multivitamin.  Hypothyroidism s/p thyroidectomy for multinodular goiter.  She is taking Cytomel 5 mcg daily as well as Synthroid 150 mcg daily.  She reports good compliance with these medications.  She denies skin or hair changes, constipation, diarrhea, cold intolerance, heat intolerance.  She does report generalized fatigue.  Depression, fibromyalgia: Patient is taking Celexa 40 mg daily and trazodone at bedtime.  She states the trazodone helps with both her mood and her ability to sleep.  She feels as though she is fairly well controlled.  She does have stress in her life and in her marriage currently which she thinks may exacerbate the issue sometimes.  She denies SI/HI.  Asthma: Mild and intermittent.  Taking Advair twice daily.  Not needing albuterol frequently at all.  She also has allergies for which she takes Zyrtec and Singulair.  Hypertension: Patient is taking metoprolol twice daily, Lasix 20 mg daily, benazepril.  She reports good compliance with these medications.  She reports that her home blood pressure is well controlled.  She denies any chest pain, shortness of breath, vision changes,  headaches currently.  Patient's other specialists include Dr. Vickki Muff for podiatry and Dr. Dionicio Stall for ENT  -----------------------------------------------------------------   Review of Systems  Constitutional: Negative.   HENT: Positive for postnasal drip. Negative for congestion, dental problem, drooling, ear discharge, ear pain, facial swelling, hearing loss, mouth sores, nosebleeds, rhinorrhea, sinus pressure, sinus pain, sneezing, sore throat, tinnitus, trouble swallowing  and voice change.   Eyes: Negative.   Respiratory: Negative.   Cardiovascular: Negative.   Gastrointestinal: Negative.   Endocrine: Negative.   Genitourinary: Negative.   Musculoskeletal: Positive for back pain and neck pain. Negative for arthralgias, gait problem, joint swelling, myalgias and neck stiffness.  Skin: Negative.   Allergic/Immunologic: Positive for environmental allergies. Negative for food allergies and immunocompromised state.  Neurological: Negative.   Hematological: Negative.   Psychiatric/Behavioral: Negative.     Social History      She  reports that she quit smoking about 13 years ago. Her smoking use included cigarettes. She has never used smokeless tobacco. She reports that she drinks alcohol. She reports that she does not use drugs.       Social History   Socioeconomic History  . Marital status: Married    Spouse name: Gwyndolyn Saxon  . Number of children: 2  . Years of education: 58  . Highest education level: Not on file  Occupational History  . 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 Financial resource strain: Not on file  . Food insecurity:    Worry: Not on file    Inability: Not on file  . Transportation needs:    Medical: Not on file    Non-medical: Not on file  Tobacco Use  . Smoking status: Former Smoker    Types: Cigarettes    Last attempt to quit: 06/16/2004    Years since quitting: 13.3  . Smokeless tobacco: Never Used  . Tobacco comment: Socially-no longer  Substance and Sexual Activity  . Alcohol use: Yes    Alcohol/week: 0.0 oz    Comment: Occasionally-wine  . Drug use: No  . Sexual activity: Not on file  Lifestyle  . Physical activity:    Days per week: Not on file    Minutes per session: Not on file  . Stress: Not on file  Relationships  . Social connections:    Talks on phone: Not on file    Gets together: Not on file    Attends religious service: Not on file     Active member of club or organization: Not on file    Attends meetings of clubs or organizations: Not on file    Relationship status: Not on file  Other Topics Concern  . Not on file  Social History Narrative   Caffeine: 1 cup coffee/day   Lives with husband Gwyndolyn Saxon (Bill)) and mother in [place], youngest daughter.  1 dog.   Edu: 12th grade   Occupation: Web designer   Activity: taking care of grandson, no regular exercise   Diet: good water, fruits/vegetables daily    Past Medical History:  Diagnosis Date  . Anxiety   . Basal cell carcinoma    nose  . Bleeding disorder ([place])   . Bronchial asthma   . Cancer ([place])    melanoma; right knee  . 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 Gigi Gin ortho)  . Chronic venous insufficiency 2007   s/p vein  stripping and laser ablation  . Depression   . Diverticulosis    mild by colonoscopy  . History of DVT (deep vein thrombosis)    DVTs after 1st pregnancy, not on AC 2/2 bleeding ulcer, greenfield filter in place  . History of gastric ulcer 2009  . History of kidney stones   . Horseshoe kidney    sole, R kidney damage  . HTN (hypertension)   . Hypothyroidism   . Kidney stones    s/p renal hematoma after lithotripsy on right  . Lupus anticoagulant disorder ([place])   . Multinodular goiter    s/p thyroidectomy  . Nasal septal perforation    chronic, ENT rec avoid antihistamine, INS  . Osteoarthritis    ?FM by rheum  . Overweight   . Post-operative nausea and vomiting   . Post-surgical hypothyroidism    for multinodular goiter  . 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 Frutoso Chase  . Seasonal allergies    allergy shots, singulair  . 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 Tachycardia      Patient Active Problem List   Diagnosis Date Noted  . Acute bacterial bronchitis 06/01/2017  . Secondary hyperparathyroidism, non-renal ([place]) 05/02/2017  . CKD (chronic kidney disease) stage 2, GFR 60-89  ml/min 05/02/2017  . Elevated alkaline phosphatase level 04/30/2017  . Primary localized osteoarthritis of left hip 03/19/2017  . Pain in limb 10/13/2016  . Pre-op exam 08/28/2016  . History of DVT (deep vein thrombosis) 08/25/2016  . PAD (peripheral artery disease) ([place]) 08/25/2016  . Chronic venous insufficiency 08/25/2016  . Iron deficiency anemia 03/25/2016  . Primary osteoarthritis of right hip 11/06/2015  . History of kidney stones 09/03/2015  . Osteoarthritis   . Fibromyalgia 02/14/2014  . Atrial tachycardia ([place]) 01/25/2013  . Hypothyroidism, postsurgical 02/25/2012  . Edema 01/28/2012  . Headache(784.0) 09/25/2011  . Bronchial asthma   . Healthcare maintenance 03/18/2011  . Hot flashes 03/18/2011  . Nasal septal perforation   . Horseshoe kidney   . HTN (hypertension)   . Depression   . Lupus anticoagulant disorder ([place])   . 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 ABDOMINAL HERNIA REPAIR    . ABDOMINAL HYSTERECTOMY    . ANKLE FUSION Left 05/2013  . APPENDECTOMY  1978  . BACK SURGERY  2009  . CESAREAN SECTION     X 2  . CHOLECYSTECTOMY  1998  . COLONOSCOPY  05/2012   hyperplastic polyps x2, mild diverticulosis Deatra Ina) rpt 10 yrs  . CYST EXCISION     back  and shoulder  . ESI Right 10/2015, 02/2016   C4/5 then C5/6 ESI ([place])  . ESI Bilateral 06/2015, 08/2015, 03/2016, 09/2017   L4/5 transforaminal ESI ([place])  . FL HIP INJECTION ([facility]) Left 11/2016   Dr Sharlet Salina  . FUSION OF TALONAVICULAR JOINT Left 09/12/2016   Procedure: FUSION OF TALONAVICULAR JOINT WITH TRINITY BONE GRAFT;  Surgeon: Samara Deist, DPM;  Location: [facility] ORS;  Service: Podiatry;  Laterality: Left;  Marland Kitchen GASTRIC BYPASS  05/21/05   Dr. Frutoso Chase, Roux-en-Y  . Greenfield filter removal  2015   removed 6 wks after surgery  . hammerhead toes Left   . HERNIA REPAIR     X 3  . IVC FILTER INSERTION N/A 09/09/2016   Procedure: IVC Filter Insertion;   Surgeon: Katha Cabal, MD;  Location: [place] [facility];  Service: Cardiovascular;  Laterality: N/A;  .  IVC FILTER REMOVAL N/A 11/25/2016   Procedure: IVC Filter Removal;  Surgeon: Katha Cabal, MD;  Location: [place] [facility];  Service: Cardiovascular;  Laterality: N/A;  . JOINT REPLACEMENT    . lyphoma excision  08/2015   arm  . NASAL SEPTUM SURGERY  1981   deviated septum, repaired at [facility]  . PARTIAL HYSTERECTOMY  1989   ovaries remain  . SKIN SURGERY     basel cell  . TONSILLECTOMY  1969  . TOTAL HIP ARTHROPLASTY Right 11/06/2015   Procedure: TOTAL HIP ARTHROPLASTY ANTERIOR APPROACH;  Surgeon: Hessie Knows, MD;  Location: [facility] ORS;  Service: Orthopedics;  Laterality: Right;  . TOTAL HIP ARTHROPLASTY Left 03/19/2017   Procedure: TOTAL HIP ARTHROPLASTY ANTERIOR APPROACH;  Surgeon: Hessie Knows, MD;  Location: [facility] OR[facility];  Service: Orthopedics;  Laterality: Left;  . TOTAL KNEE ARTHROPLASTY Left 08/2013  . TOTAL THYROIDECTOMY  09/2011   benign path (done for multinodular goiter concern for cancer)    Family History        Family Status  Relation Name Status  . Mother  Deceased  . Father  Deceased  . Mat Uncle  (Not Specified)  . PGM  (Not Specified)  . Neg Hx  (Not Specified)        Her family history includes Alcohol abuse in her father; Alzheimer's disease in her mother; Cancer in her father and maternal uncle; Coronary artery disease (age of onset: 64) in her father; Diabetes in her father, mother, and paternal grandmother; Hyperlipidemia in her mother. There is no history of Stroke, Colon cancer, Stomach cancer, Kidney disease, Breast cancer, Kidney cancer, or Bladder Cancer.      Allergies  Allergen Reactions  . Sulfa Antibiotics Shortness Of Breath  . Cefuroxime Axetil Other (See Comments)    Bleeding ulcer  . Nutritional Supplements Swelling    Unsure which  . Other     All narcotics   . Oxycodone-Acetaminophen Nausea And Vomiting  . Betadine [Povidone  Iodine] Rash     Current Outpatient Medications:  .  acetaminophen (TYLENOL) 500 MG tablet, Take 1,000 mg by mouth 2 (two) times daily. , Disp: , Rfl:  .  ACETAMINOPHEN-BUTALBITAL 50-325 MG TABS, Take 1 tablet by mouth at bedtime as needed (sleep). Reported on 09/03/2015 3 times a week, Disp: , Rfl:  .  albuterol (PROVENTIL HFA;VENTOLIN HFA) 108 (90 Base) MCG/ACT inhaler, Inhale 2 puffs into the lungs every 6 (six) hours as needed for wheezing or shortness of breath., Disp: 1 Inhaler, Rfl: 3 .  apixaban (ELIQUIS) 5 MG TABS tablet, Take 1 tablet (5 mg total) by mouth 2 (two) times daily., Disp: 60 tablet, Rfl: 1 .  aspirin 81 MG tablet, Take 81 mg by mouth daily., Disp: , Rfl:  .  benazepril (LOTENSIN) 10 MG tablet, TAKE 1 TABLET BY MOUTH ONCE DAILY, Disp: 90 tablet, Rfl: 2 .  calcium carbonate (CALCIUM 600) 600 MG TABS tablet, Take 1 tablet (600 mg total) 2 (two) times daily with a meal by mouth., Disp: 60 tablet, Rfl:  .  carisoprodol (SOMA) 350 MG tablet, TAKE 1 TABLET BY MOUTH AT BEDTIME, Disp: 90 tablet, Rfl: 1 .  cetirizine (ZYRTEC) 10 MG tablet, Take 10 mg by mouth daily., Disp: , Rfl:  .  Cholecalciferol (VITAMIN D3) 1000 UNITS tablet, Take 2,000 Units by mouth daily. , Disp: , Rfl:  .  citalopram (CELEXA) 40 MG tablet, TAKE ONE TABLET BY MOUTH ONCE DAILY, Disp: 90 tablet, Rfl: 3 .  cyclobenzaprine (FLEXERIL) 10 MG tablet, cyclobenzaprine 10 mg tablet, Disp: , Rfl:  .  EPINEPHrine (EPI-PEN) 0.3 mg/0.3 mL DEVI, Inject 0.3 mLs (0.3 mg total) into the muscle once. (Patient taking differently: Inject 0.3 mg into the muscle once. ), Disp: 1 Device, Rfl: 2 .  estradiol (ESTRACE) 2 MG tablet, Take 1 tablet by mouth daily. , Disp: , Rfl: 0 .  Fluticasone-Salmeterol (ADVAIR DISKUS) 100-50 MCG/DOSE AEPB, Inhale 1 puff into the lungs 2 (two) times daily., Disp: 60 each, Rfl: 11 .  furosemide (LASIX) 20 MG tablet, TAKE 1 TABLET BY MOUTH ONCE DAILY, Disp: 90 tablet, Rfl: 2 .  HYDROmorphone (DILAUDID) 2  MG tablet, Take 1 tablet (2 mg total) by mouth every 6 (six) hours as needed for severe pain (takes along with Zofran)., Disp: 30 tablet, Rfl: 0 .  levothyroxine (SYNTHROID, LEVOTHROID) 150 MCG tablet, Take 150 mcg by mouth daily before breakfast., Disp: , Rfl:  .  liothyronine (CYTOMEL) 5 MCG tablet, liothyronine 5 mcg tablet, Disp: , Rfl:  .  metoprolol tartrate (LOPRESSOR) 25 MG tablet, TAKE 1 TABLET BY MOUTH TWICE DAILY, Disp: 180 tablet, Rfl: 3 .  montelukast (SINGULAIR) 10 MG tablet, Take 10 mg by mouth every morning. , Disp: , Rfl:  .  ondansetron (ZOFRAN) 4 MG tablet, Take 1 tablet (4 mg total) by mouth every 8 (eight) hours as needed for nausea or vomiting., Disp: 30 tablet, Rfl: 0 .  traZODone (DESYREL) 50 MG tablet, TAKE 1/2 TO 1 (ONE-HALF TO ONE) TABLET BY MOUTH AT BEDTIME AS NEEDED FOR SLEEP, Disp: 30 tablet, Rfl: 5 .  Turmeric 500 MG CAPS, Take 1 capsule by mouth 2 (two) times daily., Disp: , Rfl:  .  vitamin B-12 (CYANOCOBALAMIN) 500 MCG tablet, Take 500 mcg by mouth daily., Disp: , Rfl:  .  Zoster Vaccine Adjuvanted (SHINGRIX) injection, Shingrix (PF) 50 mcg/0.5 mL intramuscular suspension, kit, Disp: , Rfl:    Patient Care Team: Virginia Crews, MD as PCP - General (Family Medicine) Samara Deist, DPM as Referring Physician (Podiatry) Samara Deist, DPM as Referring Physician (Podiatry) Schnier, Dolores Lory, MD (Vascular Surgery)      Objective:   Vitals: BP 118/82 (BP Location: Left Arm, Patient Position: Sitting, Cuff Size: Large)   Pulse 71   Temp 98.2 F (36.8 C) (Oral)   Resp 16   Ht _0  (1.753 m)   Wt 233 lb (105.7 kg)   SpO2 98%   BMI 34.41 kg/m    Vitals:   11/03/17 0910  BP: 118/82  Pulse: 71  Resp: 16  Temp: 98.2 F (36.8 C)  TempSrc: Oral  SpO2: 98%  Weight: 233 lb (105.7 kg)  Height: _1  (1.753 m)     Physical Exam  Constitutional: She is oriented to person, place, and time. She appears well-developed and well-nourished. No distress.    HENT:  Head: Normocephalic and atraumatic.  Right Ear: External ear normal.  Left Ear: External ear normal.  Nose: Nose normal.  Mouth/Throat: Oropharynx is clear and moist.  Eyes: Pupils are equal, round, and reactive to light. Conjunctivae and EOM are normal. No scleral icterus.  Neck: Neck supple. No thyromegaly present.  Cardiovascular: Normal rate, regular rhythm, normal heart sounds and intact distal pulses.  No murmur heard. Pulmonary/Chest: Effort normal and breath sounds normal. No respiratory distress. She has no wheezes. She has no rales.  Abdominal: Soft. Bowel sounds are normal. She exhibits no distension. There is no tenderness. There is no rebound and  no guarding.  Musculoskeletal: She exhibits no edema.  Wrist brace in place on right wrist  Lymphadenopathy:    She has no cervical adenopathy.  Neurological: She is alert and oriented to person, place, and time.  Skin: Skin is warm and dry. Capillary refill takes less than 2 seconds. No rash noted.  Psychiatric: She has a normal mood and affect. Her behavior is normal.  Vitals reviewed.    Depression Screen PHQ 2/9 Scores 11/03/2017 04/30/2017 08/22/2013  PHQ - 2 Score 0 5 4  PHQ- 9 Score _0 Assessment & Plan:    Problem List Items Addressed This Visit      Cardiovascular and Mediastinum   HTN (hypertension) - Primary    Chronic, stable and well controlled Continue current medications Recent labs reviewed        Respiratory   Bronchial asthma    Chronic and stable and well-controlled Continue Advair and albuterol as needed        Endocrine   Hypothyroidism, postsurgical    Recent TSH within normal limits Continue levothyroxine at current dose Can continue Cytomel as well, but this is typically not as effective as Synthroid      Relevant Medications   liothyronine (CYTOMEL) 5 MCG tablet   Secondary hyperparathyroidism, non-renal ([place])    Patient was previously worked up for this and found  to have normal calcium and vitamin D levels It was suspected that this was related to her low calcium intake and she started calcium supplements twice daily She had a DEXA scan that was within normal limits in 07/2017        Musculoskeletal and Integument   Osteoarthritis    Followed by orthopedics at [place] post 1 knee replacement and bilateral hip replacement Also with DJD of lumbar spine Encouraged exercise      Relevant Medications   cyclobenzaprine (FLEXERIL) 10 MG tablet     Genitourinary   Horseshoe kidney    Followed by urology Monitor for symptoms of nephrolithiasis Monitor creatinine in setting of known CKD 2      CKD (chronic kidney disease) stage 2, GFR 60-89 ml/min    Monitor creatinine regularly Avoid nephrotoxic medications        Hematopoietic and Hemostatic   Lupus anticoagulant disorder ([place])    Record review reveals patient has antiphospholipid syndrome without diagnosis of SLE She underwent rheumatologic evaluation in 01/2014 and had negative a PLS antibodies, lupus anticoagulant negative, factor V Leiden negative, FIIDNA meg, ANA negative She also underwent lupus anticoagulant work-up by heme in 08/2016 that was negative Is recommended that she have anticoagulation perioperatively        Other   S/P gastric bypass    Doing well Labs were last checked within the last year Plan to monitor these annually      Depression    Chronic and stable Continue Celexa and trazodone Repeat PHQ 9 at next visit      Fibromyalgia    Chronic and stable Continue Celexa Encourage exercise      History of kidney stones    Followed by urology Complicated by horseshoe kidney  monitor for signs of recurrent stones      Iron deficiency anemia    Chronic and intermittent issue Patient has needed iron infusions in the past She is not taking iron supplement currently Last CBC showed increased hemoglobin from previously Plan to recheck at next visit        History  of DVT (deep vein thrombosis)    Patient has had hematologic and rheumatologic work-up and seems to have antiphospholipid syndrome Continue daily baby aspirin Anticoagulation perioperatively          Return in about 3 months (around 02/03/2018) for chronic disease f/u.   Approximately 60 minutes was spent in discussion of which greater than 50% was consultation.   The entirety of the information documented in the History of Present Illness, Review of Systems and Physical Exam were personally obtained by me. Portions of this information were initially documented by Raquel Sarna Ratchford, CMA and reviewed by me for thoroughness and accuracy.    Virginia Crews, MD, MPH [facility] 11/04/2017 2:10 PM

## 2017-11-04 ENCOUNTER — Encounter: Payer: Self-pay | Admitting: Family Medicine

## 2017-11-04 NOTE — Assessment & Plan Note (Signed)
Followed by orthopedics at [facility] [place] Status post 1 knee replacement and bilateral hip replacement Also with DJD of lumbar spine Encouraged exercise

## 2017-11-04 NOTE — Assessment & Plan Note (Signed)
Followed by urology Monitor for symptoms of nephrolithiasis Monitor creatinine in setting of known CKD 2

## 2017-11-04 NOTE — Assessment & Plan Note (Signed)
Followed by urology Complicated by horseshoe kidney  monitor for signs of recurrent stones

## 2017-11-04 NOTE — Assessment & Plan Note (Signed)
Doing well Labs were last checked within the last year Plan to monitor these annually

## 2017-11-04 NOTE — Assessment & Plan Note (Signed)
Chronic, stable and well controlled Continue current medications Recent labs reviewed

## 2017-11-04 NOTE — Assessment & Plan Note (Signed)
Chronic and stable Continue Celexa and trazodone Repeat PHQ 9 at next visit

## 2017-11-04 NOTE — Assessment & Plan Note (Signed)
Patient was previously worked up for this and found to have normal calcium and vitamin D levels It was suspected that this was related to her low calcium intake and she started calcium supplements twice daily She had a DEXA scan that was within normal limits in 07/2017

## 2017-11-04 NOTE — Assessment & Plan Note (Signed)
Patient has had hematologic and rheumatologic work-up and seems to have antiphospholipid syndrome Continue daily baby aspirin Anticoagulation perioperatively

## 2017-11-04 NOTE — Assessment & Plan Note (Signed)
Chronic and stable and well-controlled Continue Advair and albuterol as needed

## 2017-11-04 NOTE — Assessment & Plan Note (Signed)
Chronic and stable Continue Celexa Encourage exercise

## 2017-11-04 NOTE — Assessment & Plan Note (Signed)
Record review reveals patient has antiphospholipid syndrome without diagnosis of SLE She underwent rheumatologic evaluation in 01/2014 and had negative a PLS antibodies, lupus anticoagulant negative, factor V Leiden negative, FIIDNA meg, ANA negative She also underwent lupus anticoagulant work-up by heme in 08/2016 that was negative Is recommended that she have anticoagulation perioperatively

## 2017-11-04 NOTE — Assessment & Plan Note (Signed)
Monitor creatinine regularly Avoid nephrotoxic medications

## 2017-11-04 NOTE — Assessment & Plan Note (Signed)
Chronic and intermittent issue Patient has needed iron infusions in the past She is not taking iron supplement currently Last CBC showed increased hemoglobin from previously Plan to recheck at next visit

## 2017-11-04 NOTE — Assessment & Plan Note (Signed)
Recent TSH within normal limits Continue levothyroxine at current dose Can continue Cytomel as well, but this is typically not as effective as Synthroid

## 2017-11-08 ENCOUNTER — Other Ambulatory Visit: Payer: Self-pay | Admitting: Family Medicine

## 2017-11-17 ENCOUNTER — Other Ambulatory Visit: Payer: Self-pay

## 2017-11-17 NOTE — Telephone Encounter (Signed)
Pharmacy faxed a refill request of Soma.

## 2017-11-19 MED ORDER — CARISOPRODOL 350 MG PO TABS: 350.0000 mg | ORAL_TABLET | Freq: Every day | ORAL | 1 refills | Status: DC

## 2017-11-26 DIAGNOSIS — S62306D Unspecified fracture of fifth metacarpal bone, right hand, subsequent encounter for fracture with routine healing: Secondary | ICD-10-CM | POA: Diagnosis not present

## 2017-12-14 ENCOUNTER — Telehealth: Payer: Self-pay | Admitting: Cardiovascular Disease

## 2017-12-14 NOTE — Telephone Encounter (Signed)
Received records request [employer], forwarded to [facility] for processing.

## 2018-02-03 ENCOUNTER — Ambulatory Visit (INDEPENDENT_AMBULATORY_CARE_PROVIDER_SITE_OTHER): Payer: BLUE CROSS/BLUE SHIELD | Admitting: Family Medicine

## 2018-02-03 ENCOUNTER — Encounter: Payer: Self-pay | Admitting: Family Medicine

## 2018-02-03 VITALS — BP 116/72 | HR 60 | Temp 98.0°F | Resp 16 | Wt 238.0 lb

## 2018-02-03 DIAGNOSIS — D509 Iron deficiency anemia, unspecified: Secondary | ICD-10-CM

## 2018-02-03 DIAGNOSIS — E890 Postprocedural hypothyroidism: Secondary | ICD-10-CM | POA: Diagnosis not present

## 2018-02-03 DIAGNOSIS — N182 Chronic kidney disease, stage 2 (mild): Secondary | ICD-10-CM

## 2018-02-03 DIAGNOSIS — F3341 Major depressive disorder, recurrent, in partial remission: Secondary | ICD-10-CM

## 2018-02-03 DIAGNOSIS — Z23 Encounter for immunization: Secondary | ICD-10-CM | POA: Diagnosis not present

## 2018-02-03 MED ORDER — BUPROPION HCL ER (SR) 150 MG PO TB12: 150.0000 mg | ORAL_TABLET | Freq: Two times a day (BID) | ORAL | 3 refills | Status: DC

## 2018-02-03 NOTE — Progress Notes (Signed)
Patient: Claire Strong Female    DOB: [birth year]   57 y.o.   MRN: [id number] Visit Date: 02/03/2018  Today's Provider: Lavon Paganini, MD   I, Martha Clan, CMA, am acting as scribe for Lavon Paganini, MD.  Chief Complaint  Patient presents with  . Depression  . Chronic Kidney Disease  . Anemia   Subjective:    HPI     Depression, Follow-up  She  was last seen for this 3 months ago. Changes made at last visit include continuing Celexa.   She reports good compliance with treatment. She is not having side effects.   She reports good tolerance of treatment. Current symptoms include: difficulty concentrating, fatigue, feelings of worthlessness/guilt and impaired memory She feels she is Unchanged since last visit.  Depression screen [facility] 2/9 11/03/2017 04/30/2017 08/22/2013  Decreased Interest 0 3 2  Down, Depressed, Hopeless 0 2 2  PHQ - 2 Score 0 5 4  Altered sleeping 0 1 3  Tired, decreased energy '3 3 3  '$ Change in appetite '1 1 2  '$ Feeling bad or failure about yourself  '1 3 1  '$ Trouble concentrating '1 3 3  '$ Moving slowly or fidgety/restless '1 3 2  '$ Suicidal thoughts 0 0 0  PHQ-9 Score '7 19 18  '$ Difficult doing work/chores Somewhat difficult - -  Some recent data might be hidden    ------------------------------------------------------------------------  Follow up for CKD II  The patient was last seen for this 3 months ago. Changes made at last visit include advising pt to avoid nephrotoxic medications, and planning to monitor creatinine regularly.  ------------------------------------------------------------------------------------   Follow up for Iron Deficiency Anemia  The patient was last seen for this 3 months ago. Changes made at last visit include planning to check CBC at follow up..  Pt is not currently taking iron supplement due to constipation, but has needed transfusions in the  past.  ------------------------------------------------------------------------------------      Allergies  Allergen Reactions  . Sulfa Antibiotics Shortness Of Breath  . Cefuroxime Axetil Other (See Comments)    Bleeding ulcer  . Nutritional Supplements Swelling    Unsure which  . Other     All narcotics   . Oxycodone-Acetaminophen Nausea And Vomiting  . Betadine [Povidone Iodine] Rash     Current Outpatient Medications:  .  acetaminophen (TYLENOL) 500 MG tablet, Take 1,000 mg by mouth 2 (two) times daily. , Disp: , Rfl:  .  ACETAMINOPHEN-BUTALBITAL 50-325 MG TABS, Take 1 tablet by mouth at bedtime as needed (sleep). Reported on 09/03/2015 3 times a week, Disp: , Rfl:  .  albuterol (PROVENTIL HFA;VENTOLIN HFA) 108 (90 Base) MCG/ACT inhaler, Inhale 2 puffs into the lungs every 6 (six) hours as needed for wheezing or shortness of breath., Disp: 1 Inhaler, Rfl: 3 .  aspirin 81 MG tablet, Take 81 mg by mouth daily., Disp: , Rfl:  .  benazepril (LOTENSIN) 10 MG tablet, TAKE 1 TABLET BY MOUTH ONCE DAILY, Disp: 90 tablet, Rfl: 2 .  bisacodyl (DULCOLAX) 5 MG EC tablet, Take 5 mg by mouth daily as needed for moderate constipation., Disp: , Rfl:  .  calcium carbonate (CALCIUM 600) 600 MG TABS tablet, Take 1 tablet (600 mg total) 2 (two) times daily with a meal by mouth., Disp: 60 tablet, Rfl:  .  carisoprodol (SOMA) 350 MG tablet, Take 1 tablet (350 mg total) by mouth at bedtime., Disp: 90 tablet, Rfl: 1 .  cetirizine (ZYRTEC) 10 MG tablet,  Take 10 mg by mouth daily., Disp: , Rfl:  .  Cholecalciferol (VITAMIN D3) 1000 UNITS tablet, Take 2,000 Units by mouth daily. , Disp: , Rfl:  .  citalopram (CELEXA) 40 MG tablet, TAKE ONE TABLET BY MOUTH ONCE DAILY, Disp: 90 tablet, Rfl: 3 .  cyclobenzaprine (FLEXERIL) 10 MG tablet, cyclobenzaprine 10 mg tablet, Disp: , Rfl:  .  EPINEPHrine (EPI-PEN) 0.3 mg/0.3 mL DEVI, Inject 0.3 mLs (0.3 mg total) into the muscle once. (Patient taking differently:  Inject 0.3 mg into the muscle once. ), Disp: 1 Device, Rfl: 2 .  estradiol (ESTRACE) 2 MG tablet, Take 1 tablet by mouth daily. , Disp: , Rfl: 0 .  Fluticasone-Salmeterol (ADVAIR DISKUS) 100-50 MCG/DOSE AEPB, Inhale 1 puff into the lungs 2 (two) times daily., Disp: 60 each, Rfl: 11 .  furosemide (LASIX) 20 MG tablet, TAKE 1 TABLET BY MOUTH ONCE DAILY, Disp: 90 tablet, Rfl: 2 .  levothyroxine (SYNTHROID, LEVOTHROID) 150 MCG tablet, Take 150 mcg by mouth daily before breakfast., Disp: , Rfl:  .  liothyronine (CYTOMEL) 5 MCG tablet, liothyronine 5 mcg tablet, Disp: , Rfl:  .  metoprolol tartrate (LOPRESSOR) 25 MG tablet, TAKE 1 TABLET BY MOUTH TWICE DAILY, Disp: 180 tablet, Rfl: 3 .  montelukast (SINGULAIR) 10 MG tablet, Take 10 mg by mouth every morning. , Disp: , Rfl:  .  ondansetron (ZOFRAN) 4 MG tablet, Take 1 tablet (4 mg total) by mouth every 8 (eight) hours as needed for nausea or vomiting., Disp: 30 tablet, Rfl: 0 .  traZODone (DESYREL) 50 MG tablet, TAKE 1/2 TO 1 (ONE-HALF TO ONE) TABLET BY MOUTH AT BEDTIME AS NEEDED FOR SLEEP, Disp: 30 tablet, Rfl: 5 .  Turmeric 500 MG CAPS, Take 1 capsule by mouth 2 (two) times daily., Disp: , Rfl:  .  vitamin B-12 (CYANOCOBALAMIN) 500 MCG tablet, Take 500 mcg by mouth daily., Disp: , Rfl:  .  Zoster Vaccine Adjuvanted (SHINGRIX) injection, Shingrix (PF) 50 mcg/0.5 mL intramuscular suspension, kit, Disp: , Rfl:   Review of Systems  Constitutional: Positive for fatigue. Negative for activity change, appetite change, chills, diaphoresis, fever and unexpected weight change.  Respiratory: Negative for shortness of breath.   Cardiovascular: Negative for chest pain, palpitations and leg swelling.  Gastrointestinal: Positive for constipation.  Musculoskeletal: Positive for arthralgias and myalgias.  Hematological: Bruises/bleeds easily.  Psychiatric/Behavioral: Positive for confusion and decreased concentration. Negative for dysphoric mood, sleep disturbance  and suicidal ideas.    Social History   Tobacco Use  . Smoking status: Former Smoker    Types: Cigarettes    Last attempt to quit: 06/16/2004    Years since quitting: 13.6  . Smokeless tobacco: Never Used  . Tobacco comment: Socially-no longer  Substance Use Topics  . Alcohol use: Yes    Alcohol/week: 0.0 standard drinks    Comment: Occasionally-wine and beer   Objective:   BP 116/72 (BP Location: Left Arm, Patient Position: Sitting, Cuff Size: Large)   Pulse 60   Temp 98 F (36.7 C) (Oral)   Resp 16   Wt 238 lb (108 kg)   SpO2 99%   BMI 35.15 kg/m  Vitals:   02/03/18 0935  BP: 116/72  Pulse: 60  Resp: 16  Temp: 98 F (36.7 C)  TempSrc: Oral  SpO2: 99%  Weight: 238 lb (108 kg)     Physical Exam  Constitutional: She is oriented to person, place, and time. She appears well-developed and well-nourished. No distress.  HENT:  Head: Normocephalic  and atraumatic.  Eyes: Conjunctivae are normal. No scleral icterus.  Neck: Neck supple. No thyromegaly present.  Cardiovascular: Normal rate, regular rhythm, normal heart sounds and intact distal pulses.  No murmur heard. Pulmonary/Chest: Effort normal and breath sounds normal. No respiratory distress. She has no wheezes. She has no rales.  Musculoskeletal: She exhibits edema (trace bilaterally L>R).  Lymphadenopathy:    She has no cervical adenopathy.  Neurological: She is alert and oriented to person, place, and time.  Skin: Skin is warm and dry. Capillary refill takes less than 2 seconds. No rash noted.  Psychiatric: Her speech is normal and behavior is normal. Her mood appears not anxious. Her affect is blunt. Her affect is not inappropriate. She is not actively hallucinating. Cognition and memory are normal. She does not exhibit a depressed mood. She expresses no homicidal and no suicidal ideation. She expresses no suicidal plans and no homicidal plans.  Vitals reviewed.      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 Recheck CMP today Avoid nephrotoxic medications      Relevant Orders   Comprehensive metabolic panel     Other   Depression    Chronic and stable, but not quite to goal Continue Celexa and trazodone at current doses Trial of Wellbutrin for augmentation Discussed possible side effects including dry mouth and weight loss Follow-up in 3 months and repeat PHQ 9 at next visit      Relevant Medications   buPROPion (WELLBUTRIN SR) 150 MG 12 hr tablet   Iron deficiency anemia    Chronic and previously uncontrolled She has needed iron infusions in the past Not currently taking an iron supplement Recheck CBC and iron panel      Relevant Orders   CBC   Fe+TIBC+Fer    Other Visit Diagnoses    Need for immunization against influenza       Relevant Orders   Flu Vaccine QUAD 36+ mos IM (Completed)       Return in about 3 months (around 05/06/2018) for Chronic disease f/u.   The entirety of the information documented in the History of Present Illness, Review of Systems and Physical Exam were personally obtained by me. Portions of this information were initially documented by Raquel Sarna Ratchford, CMA and reviewed by me for thoroughness and accuracy.    Virginia Crews, MD, MPH [facility] 02/03/2018 10:48 AM

## 2018-02-03 NOTE — Assessment & Plan Note (Signed)
Recheck TSH today Continue Synthroid at current dose Can also continue Cytomel Will titrate Synthroid dose pending TSH results

## 2018-02-03 NOTE — Assessment & Plan Note (Signed)
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

## 2018-02-03 NOTE — Assessment & Plan Note (Signed)
Chronic and stable, but not quite to goal Continue Celexa and trazodone at current doses Trial of Wellbutrin for augmentation Discussed possible side effects including dry mouth and weight loss Follow-up in 3 months and repeat PHQ 9 at next visit

## 2018-02-03 NOTE — Patient Instructions (Signed)
Anemia Anemia is a condition in which you do not have enough red blood cells or hemoglobin. Hemoglobin is a substance in red blood cells that carries oxygen. When you do not have enough red blood cells or hemoglobin (are anemic), your body cannot get enough oxygen and your organs may not work properly. As a result, you may feel very tired or have other problems. What are the causes? Common causes of anemia include:  Excessive bleeding. Anemia can be caused by excessive bleeding inside or outside the body, including bleeding from the intestine or from periods in women.  Poor nutrition.  Long-lasting (chronic) kidney, thyroid, and liver disease.  Bone marrow disorders.  Cancer and treatments for cancer.  HIV (human immunodeficiency virus) and AIDS (acquired immunodeficiency syndrome).  Treatments for HIV and AIDS.  Spleen problems.  Blood disorders.  Infections, medicines, and autoimmune disorders that destroy red blood cells.  What are the signs or symptoms? Symptoms of this condition include:  Minor weakness.  Dizziness.  Headache.  Feeling heartbeats that are irregular or faster than normal (palpitations).  Shortness of breath, especially with exercise.  Paleness.  Cold sensitivity.  Indigestion.  Nausea.  Difficulty sleeping.  Difficulty concentrating.  Symptoms may occur suddenly or develop slowly. If your anemia is mild, you may not have symptoms. How is this diagnosed? This condition is diagnosed based on:  Blood tests.  Your medical history.  A physical exam.  Bone marrow biopsy.  Your health care provider may also check your stool (feces) for blood and may do additional testing to look for the cause of your bleeding. You may also have other tests, including:  Imaging tests, such as a CT scan or MRI.  Endoscopy.  Colonoscopy.  How is this treated? Treatment for this condition depends on the cause. If you continue to lose a lot of blood,  you may need to be treated at a hospital. Treatment may include:  Taking supplements of iron, vitamin B12, or folic acid.  Taking a hormone medicine (erythropoietin) that can help to stimulate red blood cell growth.  Having a blood transfusion. This may be needed if you lose a lot of blood.  Making changes to your diet.  Having surgery to remove your spleen.  Follow these instructions at home:  Take over-the-counter and prescription medicines only as told by your health care provider.  Take supplements only as told by your health care provider.  Follow any diet instructions that you were given.  Keep all follow-up visits as told by your health care provider. This is important. Contact a health care provider if:  You develop new bleeding anywhere in the body. Get help right away if:  You are very weak.  You are short of breath.  You have pain in your abdomen or chest.  You are dizzy or feel faint.  You have trouble concentrating.  You have bloody or black, tarry stools.  You vomit repeatedly or you vomit up blood. Summary  Anemia is a condition in which you do not have enough red blood cells or enough of a substance in your red blood cells that carries oxygen (hemoglobin).  Symptoms may occur suddenly or develop slowly.  If your anemia is mild, you may not have symptoms.  This condition is diagnosed with blood tests as well as a medical history and physical exam. Other tests may be needed.  Treatment for this condition depends on the cause of the anemia. This information is not intended to replace advice   given to you by your health care provider. Make sure you discuss any questions you have with your health care provider. Document Released: 07/10/2004 Document Revised: 07/04/2016 Document Reviewed: 07/04/2016 Elsevier Interactive Patient Education  [employer].

## 2018-02-03 NOTE — Assessment & Plan Note (Signed)
Chronic and previously uncontrolled She has needed iron infusions in the past Not currently taking an iron supplement Recheck CBC and iron panel

## 2018-02-04 ENCOUNTER — Telehealth: Payer: Self-pay

## 2018-02-04 LAB — COMPREHENSIVE METABOLIC PANEL
ALBUMIN: 3.8 g/dL (ref 3.5–5.5)
ALT: 20 IU/L (ref 0–32)
AST: 22 IU/L (ref 0–40)
Albumin/Globulin Ratio: 1.8 (ref 1.2–2.2)
Alkaline Phosphatase: 102 IU/L (ref 39–117)
BILIRUBIN TOTAL: 0.3 mg/dL (ref 0.0–1.2)
BUN / CREAT RATIO: 16 (ref 9–23)
BUN: 14 mg/dL (ref 6–24)
CHLORIDE: 101 mmol/L (ref 96–106)
CO2: 24 mmol/L (ref 20–29)
Calcium: 8.2 mg/dL — ABNORMAL LOW (ref 8.7–10.2)
Creatinine, Ser: 0.88 mg/dL (ref 0.57–1.00)
GFR calc non Af Amer: 74 mL/min/{1.73_m2} (ref >59)
GFR, EST AFRICAN AMERICAN: 85 mL/min/{1.73_m2} (ref >59)
Globulin, Total: 2.1 g/dL (ref 1.5–4.5)
Glucose: 82 mg/dL (ref 65–99)
Potassium: 4.9 mmol/L (ref 3.5–5.2)
Sodium: 140 mmol/L (ref 134–144)
TOTAL PROTEIN: 5.9 g/dL — AB (ref 6.0–8.5)

## 2018-02-04 LAB — CBC
Hematocrit: 39.4 % (ref 34.0–46.6)
Hemoglobin: 12.7 g/dL (ref 11.1–15.9)
MCH: 29.7 pg (ref 26.6–33.0)
MCHC: 32.2 g/dL (ref 31.5–35.7)
MCV: 92 fL (ref 79–97)
Platelets: 180 10*3/uL (ref 150–450)
RBC: 4.27 x10E6/uL (ref 3.77–5.28)
RDW: 12.7 % (ref 12.3–15.4)
WBC: 4.2 10*3/uL (ref 3.4–10.8)

## 2018-02-04 LAB — IRON,TIBC AND FERRITIN PANEL
Ferritin: 16 ng/mL (ref 15–150)
Iron Saturation: 15 % (ref 15–55)
Iron: 54 ug/dL (ref 27–159)
TIBC: 358 ug/dL (ref 250–450)
UIBC: 304 ug/dL (ref 131–425)

## 2018-02-04 LAB — TSH: TSH: 0.849 u[IU]/mL (ref 0.450–4.500)

## 2018-02-04 NOTE — Telephone Encounter (Signed)
-----   Message from [name], MD sent at 02/04/2018  9:34 AM EDT ----- Normal Thyroid function, Blood counts, iron panel, kidney function, liver function and electrolytes.  Calcium is slightly low.  Be sure you are taking your multivitamin with calcium daily.  [name], MD, MPH [facility] 02/04/2018 9:34 AM

## 2018-02-04 NOTE — Telephone Encounter (Signed)
Pt advised and agrees with results.

## 2018-02-28 NOTE — Progress Notes (Deleted)
Cardiology Office Note  Date:  02/28/2018   ID:  Claire Strong, DOB 1960/07/15, MRN [id number]  PCP:  Virginia Crews, MD   No chief complaint on file.   HPI:  57 year old woman with h/o  lupus anticoagulant with remote DVT,  HTN,  horseshoe kidney,  asthma  multinodular goiter s/p thyroidectomy, Obesity,   s/p roux en y gastric bypass,   post surgical hypertensive/palpitation episode associated with severe HA seen at [facility] ER, long history of palpitations, atrial tachycardia. prior Holter monitor showed atrial tachycardia.  who presents for routine evaluation of her palpitations.  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  report discussed with her in detail She does not want a cholesterol medication   chronic back discomfort. Out of work secondary to chronic pain  New hip Nov 06 2015, Bulging disk, nonsurgical situation  EKG on today's visit shows sinus bradycardia with rate 56 bpm, no significant ST or T-wave changes  She takes Lasix for leg edema History of weight loss after GI surgery  Prior total knee replacement on the left  Other past medical history  History of varicose veins.   History of brain surgery on the left.   Father with CAD and CABG at 32 yo, was smoker.  PMH:   has a past medical history of Anxiety, Basal cell carcinoma, Bleeding disorder ([place]), Bronchial asthma, Cancer ([place]), Cavovarus deformity of foot, Chronic venous insufficiency (2007), Depression, Diverticulosis, History of DVT (deep vein thrombosis), History of gastric ulcer (2009), History of  kidney stones, Horseshoe kidney, HTN (hypertension), Hypothyroidism, Kidney stones, Lupus anticoagulant disorder ([place]), Multinodular goiter, Nasal septal perforation, Osteoarthritis, Overweight, Post-surgical hypothyroidism, S/P gastric bypass (05/21/2005), Seasonal allergies, Sight deterioration, and Tachycardia.  PSH:    Past Surgical History:  Procedure Laterality Date  . ABDOMINAL HERNIA REPAIR    . ABDOMINAL HYSTERECTOMY    . ANKLE FUSION Left 05/2013  . APPENDECTOMY  1978  . BACK SURGERY  2009  . CESAREAN SECTION     X 2  . CHOLECYSTECTOMY  1998  . COLONOSCOPY  05/2012   hyperplastic polyps x2, mild diverticulosis Deatra Ina) rpt 10 yrs  . CYST EXCISION     back  and shoulder  . ESI Right 10/2015, 02/2016   C4/5 then C5/6 ESI (Chasnis)  . ESI Bilateral 06/2015, 08/2015, 03/2016, 09/2017   L4/5 transforaminal ESI (Chasnis)  . FL HIP INJECTION ([facility] HX) Left 11/2016   Dr Sharlet Salina  . FUSION OF TALONAVICULAR JOINT Left 09/12/2016   Procedure: FUSION OF TALONAVICULAR JOINT WITH TRINITY BONE GRAFT;  Surgeon: Samara Deist, DPM;  Location: [facility] ORS;  Service: Podiatry;  Laterality: Left;  Marland Kitchen GASTRIC BYPASS  05/21/05   Dr. Frutoso Chase, Roux-en-Y  . Greenfield filter removal  2015   removed 6 wks after surgery  . hammerhead toes Left   . HERNIA REPAIR     X 3  . IVC FILTER INSERTION N/A 09/09/2016   Procedure: IVC Filter Insertion;  Surgeon: Katha Cabal, MD;  Location: [place] [facility];  Service: Cardiovascular;  Laterality: N/A;  . IVC FILTER REMOVAL N/A 11/25/2016   Procedure: IVC Filter Removal;  Surgeon: Katha Cabal,  MD;  Location: [place] [facility];  Service: Cardiovascular;  Laterality: N/A;  . JOINT REPLACEMENT    . lyphoma excision  08/2015   arm  . NASAL SEPTUM SURGERY  1981   deviated septum, repaired at [facility]  . PARTIAL HYSTERECTOMY  1989   ovaries remain  . SKIN SURGERY     basel cell  . TONSILLECTOMY  1969  . TOTAL HIP ARTHROPLASTY Right 11/06/2015    Procedure: TOTAL HIP ARTHROPLASTY ANTERIOR APPROACH;  Surgeon: Hessie Knows, MD;  Location: [facility] ORS;  Service: Orthopedics;  Laterality: Right;  . TOTAL HIP ARTHROPLASTY Left 03/19/2017   Procedure: TOTAL HIP ARTHROPLASTY ANTERIOR APPROACH;  Surgeon: Hessie Knows, MD;  Location: [facility] ORS;  Service: Orthopedics;  Laterality: Left;  . TOTAL KNEE ARTHROPLASTY Left 08/2013  . TOTAL THYROIDECTOMY  09/2011   benign path (done for multinodular goiter concern for cancer)    Current Outpatient Medications  Medication Sig Dispense Refill  . acetaminophen (TYLENOL) 500 MG tablet Take 1,000 mg by mouth 2 (two) times daily.     . ACETAMINOPHEN-BUTALBITAL 50-325 MG TABS Take 1 tablet by mouth at bedtime as needed (sleep). Reported on 09/03/2015 3 times a week    . albuterol (PROVENTIL HFA;VENTOLIN HFA) 108 (90 Base) MCG/ACT inhaler Inhale 2 puffs into the lungs every 6 (six) hours as needed for wheezing or shortness of breath. 1 Inhaler 3  . aspirin 81 MG tablet Take 81 mg by mouth daily.    . benazepril (LOTENSIN) 10 MG tablet TAKE 1 TABLET BY MOUTH ONCE DAILY 90 tablet 2  . bisacodyl (DULCOLAX) 5 MG EC tablet Take 5 mg by mouth daily as needed for moderate constipation.    Marland Kitchen buPROPion (WELLBUTRIN SR) 150 MG 12 hr tablet Take 1 tablet (150 mg total) by mouth 2 (two) times daily. 60 tablet 3  . calcium carbonate (CALCIUM 600) 600 MG TABS tablet Take 1 tablet (600 mg total) 2 (two) times daily with a meal by mouth. 60 tablet   . carisoprodol (SOMA) 350 MG tablet Take 1 tablet (350 mg total) by mouth at bedtime. 90 tablet 1  . cetirizine (ZYRTEC) 10 MG tablet Take 10 mg by mouth daily.    . Cholecalciferol (VITAMIN D3) 1000 UNITS tablet Take 2,000 Units by mouth daily.     . citalopram (CELEXA) 40 MG tablet TAKE ONE TABLET BY MOUTH ONCE DAILY 90 tablet 3  . cyclobenzaprine (FLEXERIL) 10 MG tablet cyclobenzaprine 10 mg tablet    . EPINEPHrine (EPI-PEN) 0.3 mg/0.3 mL DEVI Inject 0.3 mLs (0.3 mg total) into the  muscle once. (Patient taking differently: Inject 0.3 mg into the muscle once. ) 1 Device 2  . estradiol (ESTRACE) 2 MG tablet Take 1 tablet by mouth daily.   0  . Fluticasone-Salmeterol (ADVAIR DISKUS) 100-50 MCG/DOSE AEPB Inhale 1 puff into the lungs 2 (two) times daily. 60 each 11  . furosemide (LASIX) 20 MG tablet TAKE 1 TABLET BY MOUTH ONCE DAILY 90 tablet 2  . levothyroxine (SYNTHROID, LEVOTHROID) 150 MCG tablet Take 150 mcg by mouth daily before breakfast.    . liothyronine (CYTOMEL) 5 MCG tablet liothyronine 5 mcg tablet    . metoprolol tartrate (LOPRESSOR) 25 MG tablet TAKE 1 TABLET BY MOUTH TWICE DAILY 180 tablet 3  . montelukast (SINGULAIR) 10 MG tablet Take 10 mg by mouth every morning.     . ondansetron (ZOFRAN) 4 MG tablet Take 1 tablet (4 mg total) by mouth every 8 (eight) hours as needed  for nausea or vomiting. 30 tablet 0  . traZODone (DESYREL) 50 MG tablet TAKE 1/2 TO 1 (ONE-HALF TO ONE) TABLET BY MOUTH AT BEDTIME AS NEEDED FOR SLEEP 30 tablet 5  . Turmeric 500 MG CAPS Take 1 capsule by mouth 2 (two) times daily.    . vitamin B-12 (CYANOCOBALAMIN) 500 MCG tablet Take 500 mcg by mouth daily.    Marland Kitchen Zoster Vaccine Adjuvanted [facility]) injection Shingrix (PF) 50 mcg/0.5 mL intramuscular suspension, kit     No current facility-administered medications for this visit.      Allergies:   Sulfa antibiotics; Cefuroxime axetil; Nutritional supplements; Other; Oxycodone-acetaminophen; and Betadine [povidone iodine]   Social History:  The patient  reports that she quit smoking about 13 years ago. Her smoking use included cigarettes. She has never used smokeless tobacco. She reports that she drinks alcohol. She reports that she does not use drugs.   Family History:   family history includes Alcohol abuse in her father; Alzheimer's disease in her brother and mother; Cancer in her father and maternal uncle; Coronary artery disease (age of onset: 45) in her father; Diabetes in her father,  mother, and paternal grandmother; Hyperlipidemia in her mother; Lung cancer in her brother.    Review of Systems: Review of Systems  Constitutional: Positive for malaise/fatigue.  Respiratory: Negative.   Cardiovascular: Positive for chest pain.  Gastrointestinal: Negative.   Musculoskeletal: Negative.   Neurological: Positive for headaches.  Psychiatric/Behavioral: Negative.   All other systems reviewed and are negative.    PHYSICAL EXAM: VS:  There were no vitals taken for this visit. , BMI There is no height or weight on file to calculate BMI. GEN: Well nourished, well developed, in no acute distress, obese  HEENT: normal  Neck: no JVD, carotid bruits, or masses Cardiac: RRR; no murmurs, rubs, or gallops,no edema ,  varicose veins bilaterally Respiratory:  clear to auscultation bilaterally, normal work of breathing GI: soft, nontender, nondistended, + BS MS: no deformity or atrophy  Skin: warm and dry, no rash Neuro:  Strength and sensation are intact Psych: euthymic mood, full affect    Recent Labs: 02/03/2018: ALT 20; BUN 14; Creatinine, Ser 0.88; Hemoglobin 12.7; Platelets 180; Potassium 4.9; Sodium 140; TSH 0.849    Lipid Panel Lab Results  Component Value Date   CHOL 172 04/23/2017   HDL 43.70 04/23/2017   LDLCALC 104 (H) 04/23/2017   TRIG 122.0 04/23/2017      Wt Readings from Last 3 Encounters:  02/03/18 238 lb (108 kg)  11/03/17 233 lb (105.7 kg)  06/04/17 230 lb (104.3 kg)       ASSESSMENT AND PLAN:   Atrial tachycardia ([place]) - Plan: EKG 12-Lead No significant recent sx, On metoprolol BID. Asymptomatic bradycardia  Essential hypertension Blood pressure is well controlled on today's visit. No changes made to the medications.  Localized edema Venous insuff, recommended compression hose She feels swelling is from needing another surgery  chest pain/headache Suspect secondary to stress, atypical in nature Low risk She does not want a  cholesterol medication for mild after seen in her aorta on CT scan   Total encounter time more than 25 minutes  Greater than 50% was spent in counseling and coordination of care with the patient   Disposition:   F/U  12 months as needed   No orders of the defined types were placed in this encounter.    Signed, Esmond Plants, M.D., Ph.D. 02/28/2018  [place] [facility] [place], [place]

## 2018-03-01 ENCOUNTER — Ambulatory Visit: Payer: BLUE CROSS/BLUE SHIELD | Admitting: Cardiovascular Disease

## 2018-03-01 NOTE — Progress Notes (Signed)
Cardiology Office Note  Date:  03/02/2018   ID:  MICHAL STRZELECKI, DOB Apr 03, 1961, MRN [id number]  PCP:  Virginia Crews, MD   Chief Complaint  Patient presents with  . other    12 mo follow up."Butterfly Flutter" comes and goes x 2 weeks. Medications verbally reviewed.     HPI:  57 year old woman with h/o  lupus anticoagulant with remote DVT,  HTN,  horseshoe kidney,  asthma  multinodular goiter s/p thyroidectomy, Obesity,   s/p roux en y gastric bypass,   post surgical hypertensive/palpitation episode associated with severe HA seen at [facility] ER, long history of palpitations, atrial tachycardia. prior Holter monitor showed atrial tachycardia.  who presents for routine evaluation of her palpitations.  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   chronic back discomfort. Out of work secondary to chronic pain  Hip surgery Nov 06 2015, Bulging disk, nonsurgical situation   History of varicose veins.   History of brain surgery on the left.   Father with CAD and CABG at 69 yo, was smoker.  PMH:   has a past medical history of Anxiety, Basal cell carcinoma, Bleeding disorder ([place]), Bronchial asthma, Cancer ([place]), Cavovarus deformity of foot, Chronic venous insufficiency (2007), Depression, Diverticulosis, History of DVT (deep vein thrombosis), History of gastric ulcer (2009), History of kidney stones, Horseshoe kidney, HTN  (hypertension), Hypothyroidism, Kidney stones, Lupus anticoagulant disorder ([place]), Multinodular goiter, Nasal septal perforation, Osteoarthritis, Overweight, Post-surgical hypothyroidism, S/P gastric bypass (05/21/2005), Seasonal allergies, Sight deterioration, and Tachycardia.  PSH:    Past Surgical History:  Procedure Laterality Date  . ABDOMINAL HERNIA REPAIR    . ABDOMINAL HYSTERECTOMY    . ANKLE FUSION Left 05/2013  . APPENDECTOMY  1978  . BACK SURGERY  2009  . CESAREAN SECTION     X 2  . CHOLECYSTECTOMY  1998  . COLONOSCOPY  05/2012   hyperplastic polyps x2, mild diverticulosis Deatra Ina) rpt 10 yrs  . CYST EXCISION     back  and shoulder  . ESI Right 10/2015, 02/2016   C4/5 then C5/6 ESI (Chasnis)  . ESI Bilateral 06/2015, 08/2015, 03/2016, 09/2017   L4/5 transforaminal ESI (Chasnis)  . FL HIP INJECTION ([facility] HX) Left 11/2016   Dr Sharlet Salina  . FUSION OF TALONAVICULAR JOINT Left 09/12/2016   Procedure: FUSION OF TALONAVICULAR JOINT WITH TRINITY BONE GRAFT;  Surgeon: Samara Deist, DPM;  Location: [facility] ORS;  Service: Podiatry;  Laterality: Left;  Marland Kitchen GASTRIC BYPASS  05/21/05   Dr. Frutoso Chase, Roux-en-Y  . Greenfield filter removal  2015   removed 6 wks after surgery  . hammerhead toes Left   . HERNIA REPAIR     X 3  . IVC FILTER INSERTION N/A 09/09/2016   Procedure: IVC Filter Insertion;  Surgeon: Katha Cabal, MD;  Location: [place] [facility];  Service: Cardiovascular;  Laterality: N/A;  . IVC FILTER REMOVAL N/A 11/25/2016   Procedure: IVC Filter Removal;  Surgeon: Katha Cabal, MD;  Location: [place] [facility];  Service: Cardiovascular;  Laterality: N/A;  . JOINT REPLACEMENT    . lyphoma excision  08/2015   arm  . NASAL SEPTUM SURGERY  1981   deviated septum, repaired at [facility]  . PARTIAL HYSTERECTOMY  1989   ovaries remain  . SKIN SURGERY     basel cell  . TONSILLECTOMY  1969  . TOTAL HIP ARTHROPLASTY Right 11/06/2015   Procedure: TOTAL HIP ARTHROPLASTY ANTERIOR  APPROACH;  Surgeon: Hessie Knows, MD;  Location: [facility] ORS;  Service: Orthopedics;  Laterality: Right;  . TOTAL HIP ARTHROPLASTY Left 03/19/2017   Procedure: TOTAL HIP ARTHROPLASTY ANTERIOR APPROACH;  Surgeon: Hessie Knows, MD;  Location: [facility] ORS;  Service: Orthopedics;  Laterality: Left;  . TOTAL KNEE ARTHROPLASTY Left 08/2013  . TOTAL THYROIDECTOMY  09/2011   benign path (done for multinodular goiter concern for cancer)    Current Outpatient Medications  Medication Sig Dispense Refill  . acetaminophen (TYLENOL) 500 MG tablet Take 1,000 mg by mouth 2 (two) times daily.     . ACETAMINOPHEN-BUTALBITAL 50-325 MG TABS Take 1 tablet by mouth at bedtime as needed (sleep). Reported on 09/03/2015 3 times a week    . albuterol (PROVENTIL HFA;VENTOLIN HFA) 108 (90 Base) MCG/ACT inhaler Inhale 2 puffs into the lungs every 6 (six) hours as needed for wheezing or shortness of breath. 1 Inhaler 3  . aspirin 81 MG tablet Take 81 mg by mouth daily.    . benazepril (LOTENSIN) 10 MG tablet TAKE 1 TABLET BY MOUTH ONCE DAILY 90 tablet 2  . bisacodyl (DULCOLAX) 5 MG EC tablet Take 5 mg by mouth daily as needed for moderate constipation.    Marland Kitchen buPROPion (WELLBUTRIN SR) 150 MG 12 hr tablet Take 1 tablet (150 mg total) by mouth 2 (two) times daily. 60 tablet 3  . calcium carbonate (CALCIUM 600) 600 MG TABS tablet Take 1 tablet (600 mg total) 2 (two) times daily with a meal by mouth. 60 tablet   . carisoprodol (SOMA) 350 MG tablet Take 1 tablet (350 mg total) by mouth at bedtime. 90 tablet 1  . cetirizine (ZYRTEC) 10 MG tablet Take 10 mg by mouth daily.    . Cholecalciferol (VITAMIN D3) 1000 UNITS tablet Take 2,000 Units by mouth daily.     . citalopram (CELEXA) 40 MG tablet TAKE ONE TABLET BY MOUTH ONCE DAILY 90 tablet 3  . cyclobenzaprine (FLEXERIL) 10 MG tablet cyclobenzaprine 10 mg tablet    . EPINEPHrine (EPI-PEN) 0.3 mg/0.3 mL DEVI Inject 0.3 mLs (0.3 mg total) into the muscle once. (Patient taking differently:  Inject 0.3 mg into the muscle once. ) 1 Device 2  . estradiol (ESTRACE) 2 MG tablet Take 1 tablet by mouth daily.   0  . Fluticasone-Salmeterol (ADVAIR DISKUS) 100-50 MCG/DOSE AEPB Inhale 1 puff into the lungs 2 (two) times daily. 60 each 11  . furosemide (LASIX) 20 MG tablet TAKE 1 TABLET BY MOUTH ONCE DAILY 90 tablet 2  . levothyroxine (SYNTHROID, LEVOTHROID) 150 MCG tablet Take 150 mcg by mouth daily before breakfast.    . liothyronine (CYTOMEL) 5 MCG tablet liothyronine 5 mcg tablet    . metoprolol tartrate (LOPRESSOR) 25 MG tablet TAKE 1 TABLET BY MOUTH TWICE DAILY 180 tablet 3  . montelukast (SINGULAIR) 10 MG tablet Take 10 mg by mouth every morning.     . ondansetron (ZOFRAN) 4 MG tablet Take 1 tablet (4 mg total) by mouth every 8 (eight) hours as needed for nausea  or vomiting. 30 tablet 0  . traZODone (DESYREL) 50 MG tablet TAKE 1/2 TO 1 (ONE-HALF TO ONE) TABLET BY MOUTH AT BEDTIME AS NEEDED FOR SLEEP 30 tablet 5  . Turmeric 500 MG CAPS Take 1 capsule by mouth 2 (two) times daily.    . vitamin B-12 (CYANOCOBALAMIN) 500 MCG tablet Take 500 mcg by mouth daily.    Marland Kitchen Zoster Vaccine Adjuvanted [facility]) injection Shingrix (PF) 50 mcg/0.5 mL intramuscular suspension, kit     No current facility-administered medications for this visit.      Allergies:   Sulfa antibiotics; Cefuroxime axetil; Nutritional supplements; Other; Oxycodone-acetaminophen; and Betadine [povidone iodine]   Social History:  The patient  reports that she quit smoking about 13 years ago. Her smoking use included cigarettes. She has never used smokeless tobacco. She reports that she drinks alcohol. She reports that she does not use drugs.   Family History:   family history includes Alcohol abuse in her father; Alzheimer's disease in her brother and mother; Cancer in her father and maternal uncle; Coronary artery disease (age of onset: 65) in her father; Diabetes in her father, mother, and paternal grandmother;  Hyperlipidemia in her mother; Lung cancer in her brother.    Review of Systems: Review of Systems  Constitutional: Positive for malaise/fatigue.  Respiratory: Negative.   Cardiovascular: Positive for chest pain.  Gastrointestinal: Negative.   Musculoskeletal: Negative.   Neurological: Positive for headaches.  Psychiatric/Behavioral: Negative.   All other systems reviewed and are negative.    PHYSICAL EXAM: VS:  BP 102/62 (BP Location: Left Arm, Patient Position: Sitting, Cuff Size: Large)   Pulse (!) 57   Ht '5\' 8"'$  (1.727 m)   Wt 237 lb 3.2 oz (107.6 kg)   BMI 36.07 kg/m  , BMI Body mass index is 36.07 kg/m. Constitutional:  oriented to person, place, and time. No distress. Obese HENT:  Head: Normocephalic and atraumatic.  Eyes:  no discharge. No scleral icterus.  Neck: Normal range of motion. Neck supple. No JVD present.  Cardiovascular: Normal rate, regular rhythm, normal heart sounds and intact distal pulses. Exam reveals no gallop and no friction rub. No edema No murmur heard. Pulmonary/Chest: Effort normal and breath sounds normal. No stridor. No respiratory distress.  no wheezes.  no rales.  no tenderness.  Abdominal: Soft.  no distension.  no tenderness.  Musculoskeletal: Normal range of motion.  no  tenderness or deformity.  Neurological:  normal muscle tone. Coordination normal. No atrophy Skin: Skin is warm and dry. No rash noted. not diaphoretic.  Psychiatric:  normal mood and affect. behavior is normal. Thought content normal.    Recent Labs: 02/03/2018: ALT 20; BUN 14; Creatinine, Ser 0.88; Hemoglobin 12.7; Platelets 180; Potassium 4.9; Sodium 140; TSH 0.849    Lipid Panel Lab Results  Component Value Date   CHOL 172 04/23/2017   HDL 43.70 04/23/2017   LDLCALC 104 (H) 04/23/2017   TRIG 122.0 04/23/2017      Wt Readings from Last 3 Encounters:  03/02/18 237 lb 3.2 oz (107.6 kg)  02/03/18 238 lb (108 kg)  11/03/17 233 lb (105.7 kg)      ASSESSMENT  AND PLAN:   Atrial tachycardia ([place]) - Plan: EKG 12-Lead On metoprolol BID. Asymptomatic bradycardia Reports having some fluttering, palpitations Zio Patch ordered at her request to identify her fluttering symptoms She will come in at the end of the week to have this placed  Essential hypertension Blood pressure is well controlled on today's visit. No  changes made to the medications. Stable  Localized edema Venous insuff, recommended compression hose  chest pain/headache No further symptoms of chest pain She does not want a cholesterol medication for mild after seen in her aorta on CT scan   Total encounter time more than 25 minutes  Greater than 50% was spent in counseling and coordination of care with the patient   Disposition:   F/U  12 months    Orders Placed This Encounter  Procedures  . EKG 12-Lead     Signed, Esmond Plants, M.D., Ph.D. 03/02/2018  [place], [place]

## 2018-03-02 ENCOUNTER — Encounter: Payer: Self-pay | Admitting: Cardiovascular Disease

## 2018-03-02 ENCOUNTER — Ambulatory Visit (INDEPENDENT_AMBULATORY_CARE_PROVIDER_SITE_OTHER): Payer: BLUE CROSS/BLUE SHIELD | Admitting: Cardiovascular Disease

## 2018-03-02 VITALS — BP 102/62 | HR 57 | Ht 68.0 in | Wt 237.2 lb

## 2018-03-02 DIAGNOSIS — I700 Atherosclerosis of aorta: Secondary | ICD-10-CM

## 2018-03-02 DIAGNOSIS — I10 Essential (primary) hypertension: Secondary | ICD-10-CM | POA: Diagnosis not present

## 2018-03-02 DIAGNOSIS — I872 Venous insufficiency (chronic) (peripheral): Secondary | ICD-10-CM

## 2018-03-02 DIAGNOSIS — Z9884 Bariatric surgery status: Secondary | ICD-10-CM

## 2018-03-02 DIAGNOSIS — D6862 Lupus anticoagulant syndrome: Secondary | ICD-10-CM

## 2018-03-02 DIAGNOSIS — I471 Supraventricular tachycardia: Secondary | ICD-10-CM | POA: Diagnosis not present

## 2018-03-02 NOTE — Patient Instructions (Signed)
Medication Instructions:   No medication changes made  Labwork:  No new labs needed  Testing/Procedures:  We will place a ZIO Patch monitor on Friday for palpitations/fluttering, hx of atrial tachycardia   Follow-Up: It was a pleasure seeing you in the office today. Please call us if you have new issues that need to be addressed before your next appt.  [phone]  Your physician wants you to follow-up in: 12 months.  You will receive a reminder letter in the mail two months in advance. If you don't receive a letter, please call our office to schedule the follow-up appointment.  If you need a refill on your cardiac medications before your next appointment, please call your pharmacy.  For educational health videos Log in to : [email or web address] Or : [email or web address], password : [email or web address]

## 2018-03-09 ENCOUNTER — Other Ambulatory Visit (INDEPENDENT_AMBULATORY_CARE_PROVIDER_SITE_OTHER): Payer: BLUE CROSS/BLUE SHIELD

## 2018-03-09 DIAGNOSIS — I471 Supraventricular tachycardia: Secondary | ICD-10-CM

## 2018-03-22 DIAGNOSIS — R000 Tachycardia, unspecified: Secondary | ICD-10-CM | POA: Diagnosis not present

## 2018-04-09 ENCOUNTER — Other Ambulatory Visit
Admission: RE | Admit: 2018-04-09 | Discharge: 2018-04-09 | Disposition: A | Discharge status: Home / Self Care | Payer: BLUE CROSS/BLUE SHIELD | Source: Ambulatory Visit | Attending: Otolaryngology | Admitting: Otolaryngology

## 2018-04-09 DIAGNOSIS — E039 Hypothyroidism, unspecified: Secondary | ICD-10-CM | POA: Diagnosis not present

## 2018-04-09 LAB — T4, FREE: FREE T4: 0.7 ng/dL — AB (ref 0.82–1.77)

## 2018-04-09 LAB — TSH: TSH: 6.463 u[IU]/mL — ABNORMAL HIGH (ref 0.350–4.500)

## 2018-04-10 LAB — T3, FREE: T3, Free: 2 pg/mL (ref 2.0–4.4)

## 2018-04-12 ENCOUNTER — Other Ambulatory Visit: Payer: Self-pay | Admitting: Family Medicine

## 2018-04-12 MED ORDER — FUROSEMIDE 20 MG PO TABS: 20.0000 mg | ORAL_TABLET | Freq: Every day | ORAL | 2 refills | Status: DC

## 2018-04-12 NOTE — Telephone Encounter (Signed)
[facility] faxed refill request for the following medications:  furosemide (LASIX) 20 MG tablet   Qty: 90  Please advise.

## 2018-04-16 DIAGNOSIS — E039 Hypothyroidism, unspecified: Secondary | ICD-10-CM | POA: Diagnosis not present

## 2018-04-16 DIAGNOSIS — J3489 Other specified disorders of nose and nasal sinuses: Secondary | ICD-10-CM | POA: Diagnosis not present

## 2018-05-06 ENCOUNTER — Encounter: Payer: Self-pay | Admitting: Family Medicine

## 2018-05-06 ENCOUNTER — Ambulatory Visit (INDEPENDENT_AMBULATORY_CARE_PROVIDER_SITE_OTHER): Payer: BLUE CROSS/BLUE SHIELD | Admitting: Family Medicine

## 2018-05-06 VITALS — BP 130/82 | HR 61 | Temp 98.8°F | Wt 237.4 lb

## 2018-05-06 DIAGNOSIS — I10 Essential (primary) hypertension: Secondary | ICD-10-CM

## 2018-05-06 DIAGNOSIS — N182 Chronic kidney disease, stage 2 (mild): Secondary | ICD-10-CM | POA: Diagnosis not present

## 2018-05-06 DIAGNOSIS — R1319 Other dysphagia: Secondary | ICD-10-CM

## 2018-05-06 DIAGNOSIS — R1310 Dysphagia, unspecified: Secondary | ICD-10-CM

## 2018-05-06 DIAGNOSIS — E890 Postprocedural hypothyroidism: Secondary | ICD-10-CM | POA: Diagnosis not present

## 2018-05-06 DIAGNOSIS — F331 Major depressive disorder, recurrent, moderate: Secondary | ICD-10-CM | POA: Diagnosis not present

## 2018-05-06 DIAGNOSIS — D509 Iron deficiency anemia, unspecified: Secondary | ICD-10-CM

## 2018-05-06 NOTE — Assessment & Plan Note (Signed)
Chronic and stable, but remains uncontrolled Continue Celexa, Wellbutrin, and trazodone at current doses Patient believes that external stress is the cause of her current decreased mood and does not believe that any medication will change this Discussed importance of therapy Follow-up at next visit Contracted for safety

## 2018-05-06 NOTE — Assessment & Plan Note (Signed)
Followed by ENT Last TSH slightly elevated and Synthroid dose was increased at that time We will continue Synthroid at current dose She states that Dr. Nelda Marseille will follow-up on her TSH

## 2018-05-06 NOTE — Assessment & Plan Note (Signed)
Chronic and well controlled She is not currently taking an iron supplement due to constipation Her last hemoglobin was normal

## 2018-05-06 NOTE — Patient Instructions (Signed)

## 2018-05-06 NOTE — Progress Notes (Signed)
Patient: Claire Strong Female    DOB: 1961-04-10   57 y.o.   MRN: [id number] Visit Date: 05/06/2018  Today's Provider: Lavon Paganini, MD   Chief Complaint  Patient presents with  . Anemia  . Depression  . Chronic Kidney Disease   Subjective:    HPI  Depression, Follow-up  Shewas last seen for this 3 monthsago. Changes made at last visit include none. Shereports (excellent)compliance with treatment. She (is not)having side effects  Shereports (good)tolerance of treatment. Current symptoms include:  Shefeels sheis starting to feel better since last visit.  Depression screen [facility] 2/9 05/06/2018 02/03/2018 11/03/2017 04/30/2017 08/22/2013  Decreased Interest 2 1 0 3 2  Down, Depressed, Hopeless 2 1 0 2 2  PHQ - 2 Score 4 2 0 5 4  Altered sleeping 1 0 0 1 3  Tired, decreased energy '3 3 3 3 3  '$ Change in appetite '3 2 1 1 2  '$ Feeling bad or failure about yourself  '3 1 1 3 1  '$ Trouble concentrating '1 2 1 3 3  '$ Moving slowly or fidgety/restless '1 3 1 3 2  '$ Suicidal thoughts 2 0 0 0 0  PHQ-9 Score '18 13 7 19 18  '$ Difficult doing work/chores Somewhat difficult Not difficult at all Somewhat difficult - -  Some recent data might be hidden       Follow up for CKD II  The patient was last seen for this 3 months ago.             Changes made at last visit include advising pt to avoid nephrotoxic medications, and recheck CMP today.  ------------------------------------------------------------------------------------   Follow up for Iron Deficiency Anemia  The patient was last seen for this 3 months ago. Changes made at last visit include none.Pt is not currently taking iron supplement due to constipation, but has needed transfusions in the past.   Hypothyroidism: Followed by Dr Dionicio Stall. TSH was high last month and Synthroid was increased to 175 mcg daily.  Dysphagia: Ongoing problem for several months.  She is discussed with Dr. Dionicio Stall reassured her that  this is not related to her thyroid as she is status post removal.  He advised referral to GI.  As she will likely need EGD with possible dilation.  She does not have any difficulty swallowing liquids.  She mostly has difficulty swallowing meat and feels like it stuck in her esophagus.  ------------------------------------------------------------------------------------   Allergies  Allergen Reactions  . Sulfa Antibiotics Shortness Of Breath  . Cefuroxime Axetil Other (See Comments)    Bleeding ulcer  . Nutritional Supplements Swelling    Unsure which  . Other     All narcotics   . Oxycodone-Acetaminophen Nausea And Vomiting  . Betadine [Povidone Iodine] Rash     Current Outpatient Medications:  .  acetaminophen (TYLENOL) 500 MG tablet, Take 1,000 mg by mouth 2 (two) times daily. , Disp: , Rfl:  .  ACETAMINOPHEN-BUTALBITAL 50-325 MG TABS, Take 1 tablet by mouth at bedtime as needed (sleep). Reported on 09/03/2015 3 times a week, Disp: , Rfl:  .  albuterol (PROVENTIL HFA;VENTOLIN HFA) 108 (90 Base) MCG/ACT inhaler, Inhale 2 puffs into the lungs every 6 (six) hours as needed for wheezing or shortness of breath., Disp: 1 Inhaler, Rfl: 3 .  aspirin 81 MG tablet, Take 81 mg by mouth daily., Disp: , Rfl:  .  benazepril (LOTENSIN) 10 MG tablet, TAKE 1 TABLET BY MOUTH ONCE DAILY, Disp: 90  tablet, Rfl: 2 .  bisacodyl (DULCOLAX) 5 MG EC tablet, Take 5 mg by mouth daily as needed for moderate constipation., Disp: , Rfl:  .  buPROPion (WELLBUTRIN SR) 150 MG 12 hr tablet, Take 1 tablet (150 mg total) by mouth 2 (two) times daily., Disp: 60 tablet, Rfl: 3 .  calcium carbonate (CALCIUM 600) 600 MG TABS tablet, Take 1 tablet (600 mg total) 2 (two) times daily with a meal by mouth., Disp: 60 tablet, Rfl:  .  carisoprodol (SOMA) 350 MG tablet, Take 1 tablet (350 mg total) by mouth at bedtime., Disp: 90 tablet, Rfl: 1 .  cetirizine (ZYRTEC) 10 MG tablet, Take 10 mg by mouth daily., Disp: , Rfl:  .   Cholecalciferol (VITAMIN D3) 1000 UNITS tablet, Take 2,000 Units by mouth daily. , Disp: , Rfl:  .  citalopram (CELEXA) 40 MG tablet, TAKE ONE TABLET BY MOUTH ONCE DAILY, Disp: 90 tablet, Rfl: 3 .  cyclobenzaprine (FLEXERIL) 10 MG tablet, cyclobenzaprine 10 mg tablet, Disp: , Rfl:  .  EPINEPHrine (EPI-PEN) 0.3 mg/0.3 mL DEVI, Inject 0.3 mLs (0.3 mg total) into the muscle once. (Patient taking differently: Inject 0.3 mg into the muscle once. ), Disp: 1 Device, Rfl: 2 .  estradiol (ESTRACE) 2 MG tablet, Take 1 tablet by mouth daily. , Disp: , Rfl: 0 .  Fluticasone-Salmeterol (ADVAIR DISKUS) 100-50 MCG/DOSE AEPB, Inhale 1 puff into the lungs 2 (two) times daily., Disp: 60 each, Rfl: 11 .  furosemide (LASIX) 20 MG tablet, Take 1 tablet (20 mg total) by mouth daily., Disp: 90 tablet, Rfl: 2 .  levothyroxine (SYNTHROID, LEVOTHROID) 150 MCG tablet, Take 175 mcg by mouth daily before breakfast., Disp: , Rfl:  .  liothyronine (CYTOMEL) 5 MCG tablet, liothyronine 5 mcg tablet, Disp: , Rfl:  .  metoprolol tartrate (LOPRESSOR) 25 MG tablet, TAKE 1 TABLET BY MOUTH TWICE DAILY, Disp: 180 tablet, Rfl: 3 .  montelukast (SINGULAIR) 10 MG tablet, Take 10 mg by mouth every morning. , Disp: , Rfl:  .  ondansetron (ZOFRAN) 4 MG tablet, Take 1 tablet (4 mg total) by mouth every 8 (eight) hours as needed for nausea or vomiting., Disp: 30 tablet, Rfl: 0 .  traZODone (DESYREL) 50 MG tablet, TAKE 1/2 TO 1 (ONE-HALF TO ONE) TABLET BY MOUTH AT BEDTIME AS NEEDED FOR SLEEP, Disp: 30 tablet, Rfl: 5 .  Turmeric 500 MG CAPS, Take 1 capsule by mouth 2 (two) times daily., Disp: , Rfl:  .  vitamin B-12 (CYANOCOBALAMIN) 500 MCG tablet, Take 500 mcg by mouth daily., Disp: , Rfl:  .  Zoster Vaccine Adjuvanted (SHINGRIX) injection, Shingrix (PF) 50 mcg/0.5 mL intramuscular suspension, kit, Disp: , Rfl:   Review of Systems  Constitutional: Positive for fatigue.  HENT: Negative.   Respiratory: Negative.   Gastrointestinal: Negative.     Genitourinary: Negative.   Neurological: Negative.     Social History   Tobacco Use  . Smoking status: Former Smoker    Types: Cigarettes    Last attempt to quit: 06/16/2004    Years since quitting: 13.8  . Smokeless tobacco: Never Used  . Tobacco comment: Socially-no longer  Substance Use Topics  . Alcohol use: Yes    Alcohol/week: 0.0 standard drinks    Comment: Occasionally-wine and beer   Objective:   BP 130/82 (BP Location: Right Arm, Patient Position: Sitting, Cuff Size: Normal)   Pulse 61   Temp 98.8 F (37.1 C) (Oral)   Wt 237 lb 6.4 oz (107.7 kg)  SpO2 99%   BMI 36.10 kg/m  Vitals:   05/06/18 0814  BP: 130/82  Pulse: 61  Temp: 98.8 F (37.1 C)  TempSrc: Oral  SpO2: 99%  Weight: 237 lb 6.4 oz (107.7 kg)     Physical Exam  Constitutional: She is oriented to person, place, and time. She appears well-developed and well-nourished. No distress.  HENT:  Head: Normocephalic and atraumatic.  Right Ear: External ear normal.  Left Ear: External ear normal.  Nose: Nose normal.  Mouth/Throat: Oropharynx is clear and moist.  Eyes: Pupils are equal, round, and reactive to light. Conjunctivae and EOM are normal. No scleral icterus.  Neck: Neck supple. No thyromegaly present.  Cardiovascular: Normal rate, regular rhythm, normal heart sounds and intact distal pulses.  No murmur heard. Pulmonary/Chest: Effort normal and breath sounds normal. No respiratory distress. She has no wheezes. She has no rales.  Musculoskeletal: She exhibits no edema or deformity.  Lymphadenopathy:    She has no cervical adenopathy.  Neurological: She is alert and oriented to person, place, and time.  Skin: Skin is warm and dry. Capillary refill takes less than 2 seconds. No rash noted.  Psychiatric: Her behavior is normal. Her affect is blunt. Her speech is tangential. She exhibits a depressed mood. She expresses no homicidal and no suicidal ideation. She expresses no suicidal plans and no  homicidal plans.  Vitals reviewed.       Assessment & Plan:   Problem List Items Addressed This Visit      Cardiovascular and Mediastinum   HTN (hypertension)    Chronic stable and well-controlled Continue current medications-benazepril, Lasix, metoprolol Recheck renal function panel Follow-up in 6 months      Relevant Orders   Renal Function Panel     Digestive   Esophageal dysphagia    Referral to GI for further evaluation and management      Relevant Orders   Ambulatory referral to Gastroenterology     Endocrine   Hypothyroidism, postsurgical    Followed by ENT Last TSH slightly elevated and Synthroid dose was increased at that time We will continue Synthroid at current dose She states that Dr. 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 Avoid nephrotoxic medications Creatinine was elevated at last check, so we will recheck renal function panel today      Relevant Orders   Renal Function Panel     Other   Depression - Primary    Chronic and stable, but remains uncontrolled Continue Celexa, Wellbutrin, and trazodone at current doses Patient believes that external stress is the cause of her current decreased mood and does not believe that any medication will change this Discussed importance of therapy Follow-up at next visit Contracted for safety      Iron deficiency anemia    Chronic and well controlled She is not currently taking an iron supplement due to constipation Her last hemoglobin was normal          Return in about 6 months (around 11/04/2018) for CPE.   The entirety of the information documented in the History of Present Illness, Review of Systems and Physical Exam were personally obtained by me. Portions of this information were initially documented by Hurman Horn, CMA and reviewed by  me for thoroughness and accuracy.    Virginia Crews, MD, MPH [facility] 05/06/2018 8:51 AM

## 2018-05-06 NOTE — Assessment & Plan Note (Signed)
Chronic stable and well-controlled Continue current medications-benazepril, Lasix, metoprolol Recheck renal function panel Follow-up in 6 months

## 2018-05-06 NOTE — Assessment & Plan Note (Signed)
Referral to GI for further evaluation and management

## 2018-05-06 NOTE — Assessment & Plan Note (Signed)
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

## 2018-05-07 LAB — RENAL FUNCTION PANEL
Albumin: 3.6 g/dL (ref 3.5–5.5)
BUN/Creatinine Ratio: 14 (ref 9–23)
BUN: 13 mg/dL (ref 6–24)
CALCIUM: 8.3 mg/dL — AB (ref 8.7–10.2)
CO2: 21 mmol/L (ref 20–29)
CREATININE: 0.96 mg/dL (ref 0.57–1.00)
Chloride: 102 mmol/L (ref 96–106)
GFR calc Af Amer: 76 mL/min/{1.73_m2} (ref >59)
GFR calc non Af Amer: 66 mL/min/{1.73_m2} (ref >59)
Glucose: 85 mg/dL (ref 65–99)
PHOSPHORUS: 3.3 mg/dL (ref 2.5–4.5)
Potassium: 4.8 mmol/L (ref 3.5–5.2)
SODIUM: 139 mmol/L (ref 134–144)

## 2018-05-25 ENCOUNTER — Ambulatory Visit (INDEPENDENT_AMBULATORY_CARE_PROVIDER_SITE_OTHER): Payer: BLUE CROSS/BLUE SHIELD | Admitting: Gastroenterology

## 2018-05-25 ENCOUNTER — Encounter: Payer: Self-pay | Admitting: Gastroenterology

## 2018-05-25 ENCOUNTER — Other Ambulatory Visit: Payer: Self-pay

## 2018-05-25 VITALS — BP 96/66 | HR 71 | Ht 68.0 in | Wt 235.4 lb

## 2018-05-25 DIAGNOSIS — R1310 Dysphagia, unspecified: Secondary | ICD-10-CM

## 2018-05-25 DIAGNOSIS — R1319 Other dysphagia: Secondary | ICD-10-CM

## 2018-05-25 DIAGNOSIS — D508 Other iron deficiency anemias: Secondary | ICD-10-CM | POA: Diagnosis not present

## 2018-05-25 NOTE — Progress Notes (Addendum)
Claire Strong  [place], [place] [address]  Main: [phone]  Fax: [phone]   Gastroenterology Consultation  Referring Provider:     Virginia Crews, MD Primary Care Physician:  Virginia Crews, MD Primary Gastroenterologist:  Dr. Vonda Antigua Reason for Consultation:     Dysphagia        HPI:    Chief Complaint  Patient presents with  . New Patient (Initial Visit)    referred by Dr. Brita Romp for esophageal dysphagia    Claire Strong is a 57 y.o. y/o female referred for consultation & management  by Dr. Brita Romp, Dionne Bucy, MD.  Patient with history of gastric bypass over 13 years ago.  Reports 37-monthhistory of dysphagia to solid food specifically meats.  No episodes of food impaction or having to bring food back up.  No altered bowel habits or blood in stool.  Good appetite.  No abdominal pain.  No family history of colon cancer.  Review of her records also shows the patient is iron deficient with a ferritin of 16 which is low normal.  Not on any iron replacement and she reports constipation with it.  Last colonoscopy December 2013 by Dr. KDeatra Inafor average risk screening.  2, 2 to 3 mm sigmoid colon polyps removed which were hyperplastic on pathology report.    There is a 2008 CT abdomen pelvis available under care everywhere which reports "non-obstructive bowel gas pattern with a moderate amount of retained stool in the distal colon.  For retained sits markers. 1 retained Sitzmarks in the ascending colon and 3 retained sitz markers projecting over the rectum".  2007 gastric emptying study reports "gastric emptying showing no clear abnormality.  There are no established values for rate of gastric emptying patient status post gastric bypass.  However clearing of half of the radiotracer activity by 3 minutes could represent rapid transit."  Past Medical History:  Diagnosis Date  . Anxiety   . Basal cell carcinoma    nose  . Bleeding disorder ([place]   . Bronchial asthma   . Cancer ([facility]    melanoma; right knee  . 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 HGigi Ginortho)  . Chronic venous insufficiency 2007   s/p vein stripping and laser ablation  . Depression   . Diverticulosis    mild by colonoscopy  . History of DVT (deep vein thrombosis)    DVTs after 1st pregnancy, not on AC 2/2 bleeding ulcer, greenfield filter in place  . History of gastric ulcer 2009  . History of kidney stones   . Horseshoe kidney    sole, R kidney damage  . HTN (hypertension)   . Hypothyroidism   . Kidney stones    s/p renal hematoma after lithotripsy on right  . Lupus anticoagulant disorder ([facility]   . Multinodular goiter    s/p thyroidectomy  . Nasal septal perforation    chronic, ENT rec avoid antihistamine, INS  . Osteoarthritis    ?FM by rheum  . Overweight   . Post-surgical hypothyroidism    for multinodular goiter  . 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 FFrutoso Chase . Seasonal allergies    allergy shots, singulair  . 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 Tachycardia     Past Surgical History:  Procedure Laterality Date  . ABDOMINAL HERNIA REPAIR    . ABDOMINAL HYSTERECTOMY    .  ANKLE FUSION Left 05/2013  . APPENDECTOMY  1978  . BACK SURGERY  2009  . CESAREAN SECTION     X 2  . CHOLECYSTECTOMY  1998  . COLONOSCOPY  05/2012   hyperplastic polyps x2, mild diverticulosis Deatra Ina) rpt 10 yrs  . CYST EXCISION     back  and shoulder  . ESI Right 10/2015, 02/2016   C4/5 then C5/6 ESI (Chasnis)  . ESI Bilateral 06/2015, 08/2015, 03/2016, 09/2017   L4/5 transforaminal ESI (Chasnis)  . FL HIP INJECTION ([facility] HX) Left 11/2016   Dr Sharlet Salina  . FUSION OF TALONAVICULAR JOINT Left 09/12/2016   Procedure: FUSION OF TALONAVICULAR JOINT WITH TRINITY BONE GRAFT;  Surgeon: Samara Deist, DPM;  Location: [facility] ORS;  Service: Podiatry;  Laterality: Left;  Marland Kitchen GASTRIC BYPASS  05/21/05   Dr.  Frutoso Chase, Roux-en-Y  . Greenfield filter removal  2015   removed 6 wks after surgery  . hammerhead toes Left   . HERNIA REPAIR     X 3  . IVC FILTER INSERTION N/A 09/09/2016   Procedure: IVC Filter Insertion;  Surgeon: Katha Cabal, MD;  Location: [facility];  Service: Cardiovascular;  Laterality: N/A;  . IVC FILTER REMOVAL N/A 11/25/2016   Procedure: IVC Filter Removal;  Surgeon: Katha Cabal, MD;  Location: [facility];  Service: Cardiovascular;  Laterality: N/A;  . JOINT REPLACEMENT    . lyphoma excision  08/2015   arm  . NASAL SEPTUM SURGERY  1981   deviated septum, repaired at [facility]  . PARTIAL HYSTERECTOMY  1989   ovaries remain  . SKIN SURGERY     basel cell  . TONSILLECTOMY  1969  . TOTAL HIP ARTHROPLASTY Right 11/06/2015   Procedure: TOTAL HIP ARTHROPLASTY ANTERIOR APPROACH;  Surgeon: Hessie Knows, MD;  Location: [facility] ORS;  Service: Orthopedics;  Laterality: Right;  . TOTAL HIP ARTHROPLASTY Left 03/19/2017   Procedure: TOTAL HIP ARTHROPLASTY ANTERIOR APPROACH;  Surgeon: Hessie Knows, MD;  Location: [facility] OR[facility];  Service: Orthopedics;  Laterality: Left;  . TOTAL KNEE ARTHROPLASTY Left 08/2013  . TOTAL THYROIDECTOMY  09/2011   benign path (done for multinodular goiter concern for cancer)    Prior to Admission medications   Medication Sig Start Date End Date Taking? Authorizing Provider  acetaminophen (TYLENOL) 500 MG tablet Take 1,000 mg by mouth 2 (two) times daily.     [provider]  ACETAMINOPHEN-BUTALBITAL 50-325 MG TABS Take 1 tablet by mouth at bedtime as needed (sleep). Reported on 09/03/2015 3 times a week    [provider]  albuterol (PROVENTIL HFA;VENTOLIN HFA) 108 (90 Base) MCG/ACT inhaler Inhale 2 puffs into the lungs every 6 (six) hours as needed for wheezing or shortness of breath. 06/01/17   Ria Bush, MD  aspirin 81 MG tablet Take 81 mg by mouth daily.    [provider]  benazepril (LOTENSIN) 10 MG  tablet TAKE 1 TABLET BY MOUTH ONCE DAILY 09/28/17   Ria Bush, MD  bisacodyl (DULCOLAX) 5 MG EC tablet Take 5 mg by mouth daily as needed for moderate constipation.    [provider]  buPROPion (WELLBUTRIN SR) 150 MG 12 hr tablet Take 1 tablet (150 mg total) by mouth 2 (two) times daily. 02/03/18   Virginia Crews, MD  calcium carbonate (CALCIUM 600) 600 MG TABS tablet Take 1 tablet (600 mg total) 2 (two) times daily with a meal by mouth. 05/02/17   Ria Bush, MD  carisoprodol (SOMA) 350 MG  tablet Take 1 tablet (350 mg total) by mouth at bedtime. 11/19/17   Virginia Crews, MD  cetirizine (ZYRTEC) 10 MG tablet Take 10 mg by mouth daily.    [provider]  Cholecalciferol (VITAMIN D3) 1000 UNITS tablet Take 2,000 Units by mouth daily.     [provider]  citalopram (CELEXA) 40 MG tablet TAKE ONE TABLET BY MOUTH ONCE DAILY 06/29/17   Ria Bush, MD  cyclobenzaprine (FLEXERIL) 10 MG tablet cyclobenzaprine 10 mg tablet 10/12/17   [provider]  EPINEPHrine (EPI-PEN) 0.3 mg/0.3 mL DEVI Inject 0.3 mLs (0.3 mg total) into the muscle once. Patient taking differently: Inject 0.3 mg into the muscle once.  12/19/10   Ria Bush, MD  estradiol (ESTRACE) 2 MG tablet Take 1 tablet by mouth daily.  04/24/17   [provider]  Fluticasone-Salmeterol (ADVAIR DISKUS) 100-50 MCG/DOSE AEPB Inhale 1 puff into the lungs 2 (two) times daily. 06/01/17   Ria Bush, MD  furosemide (LASIX) 20 MG tablet Take 1 tablet (20 mg total) by mouth daily. 04/12/18   Virginia Crews, MD  levothyroxine (SYNTHROID, LEVOTHROID) 150 MCG tablet Take 175 mcg by mouth daily before breakfast.    [provider]  liothyronine (CYTOMEL) 5 MCG tablet liothyronine 5 mcg tablet    [provider]  metoprolol tartrate (LOPRESSOR) 25 MG tablet TAKE 1 TABLET BY MOUTH TWICE DAILY 06/10/17   Ria Bush, MD  montelukast (SINGULAIR) 10 MG  tablet Take 10 mg by mouth every morning.     [provider]  ondansetron (ZOFRAN) 4 MG tablet Take 1 tablet (4 mg total) by mouth every 8 (eight) hours as needed for nausea or vomiting. 03/21/17   Reche Dixon, PA-C  traZODone (DESYREL) 50 MG tablet TAKE 1/2 TO 1 (ONE-HALF TO ONE) TABLET BY MOUTH AT BEDTIME AS NEEDED FOR SLEEP 11/10/17   Bacigalupo, Dionne Bucy, MD  Turmeric 500 MG CAPS Take 1 capsule by mouth 2 (two) times daily.    [provider]  vitamin B-12 (CYANOCOBALAMIN) 500 MCG tablet Take 500 mcg by mouth daily.    [provider]  Zoster Vaccine Adjuvanted [facility]) injection Shingrix (PF) 50 mcg/0.5 mL intramuscular suspension, kit    [provider]    Family History  Problem Relation Age of Onset  . Diabetes Mother        prediabetes  . Alzheimer's disease Mother   . Hyperlipidemia Mother   . Diabetes Father   . Coronary artery disease Father 76       CABG  . Alcohol abuse Father   . Cancer Father        lung  . Cancer Maternal Uncle        brain  . Diabetes Paternal Grandmother   . Lung cancer Brother   . Alzheimer's disease Brother   . Stroke Neg Hx   . Colon cancer Neg Hx   . Stomach cancer Neg Hx   . Kidney disease Neg Hx   . Breast cancer Neg Hx   . Kidney cancer Neg Hx   . Bladder Cancer Neg Hx   . Ovarian cancer Neg Hx      Social History   Tobacco Use  . Smoking status: Former Smoker    Types: Cigarettes    Last attempt to quit: 06/16/2004    Years since quitting: 13.9  . Smokeless tobacco: Never Used  . Tobacco comment: Socially-no longer  Substance Use Topics  . Alcohol use: Yes  Alcohol/week: 0.0 standard drinks    Comment: Occasionally-wine and beer  . Drug use: No    Allergies as of 05/25/2018 - Review Complete 05/06/2018  Allergen Reaction Noted  . Sulfa antibiotics Shortness Of Breath 12/19/2010  . Cefuroxime axetil Other (See Comments) 09/03/2015  . Nutritional supplements Swelling 12/19/2010  .  Other  03/19/2017  . Oxycodone-acetaminophen Nausea And Vomiting 09/03/2015  . Betadine [povidone iodine] Rash 03/08/2016    Review of Systems:    All systems reviewed and negative except where noted in HPI.   Physical Exam:   Vitals:   05/25/18 1316  BP: 96/66  Pulse: 71  Weight: 235 lb 6.4 oz (106.8 kg)  Height: _0  (1.727 m)    No LMP recorded. Patient has had a hysterectomy. Psych:  Alert and cooperative. Normal mood and affect. General:   Alert,  Well-developed, well-nourished, pleasant and cooperative in NAD Head:  Normocephalic and atraumatic. Eyes:  Sclera clear, no icterus.   Conjunctiva pink. Ears:  Normal auditory acuity. Nose:  No deformity, discharge, or lesions. Mouth:  No deformity or lesions,oropharynx pink & moist. Neck:  Supple; no masses or thyromegaly. Abdomen:  Normal bowel sounds.  No bruits.  Soft, non-tender and non-distended without masses, hepatosplenomegaly or hernias noted.  No guarding or rebound tenderness.    Msk:  Symmetrical without gross deformities. Good, equal movement & strength bilaterally. Pulses:  Normal pulses noted. Extremities:  No clubbing or edema.  No cyanosis. Neurologic:  Alert and oriented x3;  grossly normal neurologically. Skin:  Intact without significant lesions or rashes. No jaundice. Lymph Nodes:  No significant cervical adenopathy. Psych:  Alert and cooperative. Normal mood and affect.   Labs: CBC    Component Value Date/Time   WBC 4.2 02/03/2018 1033   WBC 4.4 04/23/2017 0843   RBC 4.27 02/03/2018 1033   RBC 4.16 04/23/2017 0843   HGB 12.7 02/03/2018 1033   HCT 39.4 02/03/2018 1033   PLT 180 02/03/2018 1033   MCV 92 02/03/2018 1033   MCV 88 08/10/2013 0900   MCH 29.7 02/03/2018 1033   MCH 26.5 03/21/2017 0419   MCHC 32.2 02/03/2018 1033   MCHC 31.6 04/23/2017 0843   RDW 12.7 02/03/2018 1033   RDW 13.6 08/10/2013 0900   LYMPHSABS 1.6 04/23/2017 0843   LYMPHSABS 1.5 05/23/2013 1053   MONOABS 0.3  04/23/2017 0843   MONOABS 0.3 05/23/2013 1053   EOSABS 0.1 04/23/2017 0843   EOSABS 0.1 05/23/2013 1053   BASOSABS 0.0 04/23/2017 0843   BASOSABS 0.0 05/23/2013 1053   CMP     Component Value Date/Time   NA 139 05/06/2018 0909   NA 132 (L) 08/27/2013 0616   K 4.8 05/06/2018 0909   K 4.5 08/27/2013 0616   CL 102 05/06/2018 0909   CL 102 08/27/2013 0616   CO2 21 05/06/2018 0909   CO2 27 08/27/2013 0616   GLUCOSE 85 05/06/2018 0909   GLUCOSE 98 04/23/2017 0843   GLUCOSE 107 (H) 08/27/2013 0616   BUN 13 05/06/2018 0909   BUN 12 08/27/2013 0616   CREATININE 0.96 05/06/2018 0909   CREATININE 0.91 08/27/2013 0616   CREATININE 1.02 09/20/2011   CALCIUM 8.3 (L) 05/06/2018 0909   CALCIUM 7.4 (L) 08/27/2013 0616   CALCIUM 7.7 09/20/2011   PROT 5.9 (L) 02/03/2018 1033   PROT 6.4 09/20/2011 1746   ALBUMIN 3.6 05/06/2018 0909   ALBUMIN 3.5 09/20/2011 1746   AST 22 02/03/2018 1033   AST 41 (H) 09/20/2011  1746   AST 41 09/20/2011   ALT 20 02/03/2018 1033   ALT 68 09/20/2011 1746   ALKPHOS 102 02/03/2018 1033   ALKPHOS 127 09/20/2011 1746   ALKPHOS 127 09/20/2011   BILITOT 0.3 02/03/2018 1033   BILITOT 0.4 09/20/2011 1746   BILITOT 0.4 09/20/2011   GFRNONAA 66 05/06/2018 0909   GFRNONAA >60 08/27/2013 0616   GFRAA 76 05/06/2018 0909   GFRAA >60 08/27/2013 0616    Imaging Studies: No results found.  Assessment and Plan:   Claire Strong is a 57 y.o. y/o female has been referred for dysphagia  EGD indicated for further evaluation of etiology of dysphagia Possible EOE versus strictures  Due to iron deficiency anemia, colonoscopy is also indicated for further evaluation Etiology of iron deficiency anemia could also be her gastric bypass anatomy  If EGD and colonoscopy are negative, she would likely not need a small bowel capsule given her gastric bypass anatomy being the likely etiology of her iron deficiency  Since she is unable to take oral iron due to constipation, I  have discussed hematology referral and patient is agreeable to be referred for evaluation of IV iron  I have discussed alternative options, risks & benefits,  which include, but are not limited to, bleeding, infection, perforation,respiratory complication & drug reaction.  The patient agrees with this plan & written consent will be obtained.       Dr Claire Antigua  Speech recognition software was used to dictate the above note.

## 2018-05-26 ENCOUNTER — Telehealth: Payer: Self-pay

## 2018-05-26 NOTE — Telephone Encounter (Signed)
Colonoscopy/EGD rescheduled from 06/24/18 to 07/01/18. Pt in agreement.

## 2018-06-01 ENCOUNTER — Inpatient Hospital Stay: Payer: BLUE CROSS/BLUE SHIELD | Attending: Oncology | Admitting: Oncology

## 2018-06-01 ENCOUNTER — Other Ambulatory Visit: Payer: Self-pay

## 2018-06-01 ENCOUNTER — Encounter: Payer: Self-pay | Admitting: Oncology

## 2018-06-01 ENCOUNTER — Inpatient Hospital Stay: Payer: BLUE CROSS/BLUE SHIELD

## 2018-06-01 VITALS — BP 121/86 | HR 63 | Temp 96.3°F | Resp 18 | Ht 68.0 in | Wt 235.6 lb

## 2018-06-01 DIAGNOSIS — Z87891 Personal history of nicotine dependence: Secondary | ICD-10-CM | POA: Diagnosis not present

## 2018-06-01 DIAGNOSIS — D649 Anemia, unspecified: Secondary | ICD-10-CM

## 2018-06-01 DIAGNOSIS — R5383 Other fatigue: Secondary | ICD-10-CM | POA: Diagnosis not present

## 2018-06-01 DIAGNOSIS — Z7982 Long term (current) use of aspirin: Secondary | ICD-10-CM | POA: Insufficient documentation

## 2018-06-01 DIAGNOSIS — D508 Other iron deficiency anemias: Secondary | ICD-10-CM

## 2018-06-01 DIAGNOSIS — Z79899 Other long term (current) drug therapy: Secondary | ICD-10-CM | POA: Diagnosis not present

## 2018-06-01 DIAGNOSIS — Z85820 Personal history of malignant melanoma of skin: Secondary | ICD-10-CM | POA: Insufficient documentation

## 2018-06-01 DIAGNOSIS — Z86718 Personal history of other venous thrombosis and embolism: Secondary | ICD-10-CM | POA: Insufficient documentation

## 2018-06-01 DIAGNOSIS — R1310 Dysphagia, unspecified: Secondary | ICD-10-CM | POA: Diagnosis not present

## 2018-06-01 DIAGNOSIS — Z9884 Bariatric surgery status: Secondary | ICD-10-CM

## 2018-06-01 LAB — IRON AND TIBC
Iron: 63 ug/dL (ref 28–170)
Saturation Ratios: 15 % (ref 10.4–31.8)
TIBC: 425 ug/dL (ref 250–450)
UIBC: 362 ug/dL

## 2018-06-01 LAB — CBC WITH DIFFERENTIAL/PLATELET
Abs Immature Granulocytes: 0.02 10*3/uL (ref 0.00–0.07)
BASOS PCT: 0 %
Basophils Absolute: 0 10*3/uL (ref 0.0–0.1)
EOS PCT: 2 %
Eosinophils Absolute: 0.1 10*3/uL (ref 0.0–0.5)
HCT: 38.6 % (ref 36.0–46.0)
Hemoglobin: 12.6 g/dL (ref 12.0–15.0)
Immature Granulocytes: 0 %
Lymphocytes Relative: 30 %
Lymphs Abs: 1.4 10*3/uL (ref 0.7–4.0)
MCH: 28.8 pg (ref 26.0–34.0)
MCHC: 32.6 g/dL (ref 30.0–36.0)
MCV: 88.3 fL (ref 80.0–100.0)
MONO ABS: 0.3 10*3/uL (ref 0.1–1.0)
Monocytes Relative: 6 %
Neutro Abs: 2.9 10*3/uL (ref 1.7–7.7)
Neutrophils Relative %: 62 %
PLATELETS: 188 10*3/uL (ref 150–400)
RBC: 4.37 MIL/uL (ref 3.87–5.11)
RDW: 12.4 % (ref 11.5–15.5)
WBC: 4.6 10*3/uL (ref 4.0–10.5)
nRBC: 0 % (ref 0.0–0.2)

## 2018-06-01 LAB — COMPREHENSIVE METABOLIC PANEL
ALT: 19 U/L (ref 0–44)
AST: 21 U/L (ref 15–41)
Albumin: 3.8 g/dL (ref 3.5–5.0)
Alkaline Phosphatase: 71 U/L (ref 38–126)
Anion gap: 6 (ref 5–15)
BUN: 14 mg/dL (ref 6–20)
CALCIUM: 8.5 mg/dL — AB (ref 8.9–10.3)
CO2: 26 mmol/L (ref 22–32)
Chloride: 106 mmol/L (ref 98–111)
Creatinine, Ser: 0.99 mg/dL (ref 0.44–1.00)
GFR calc Af Amer: >60 mL/min (ref >60)
GFR calc non Af Amer: >60 mL/min (ref >60)
Glucose, Bld: 93 mg/dL (ref 70–99)
Potassium: 3.9 mmol/L (ref 3.5–5.1)
Sodium: 138 mmol/L (ref 135–145)
Total Bilirubin: 0.5 mg/dL (ref 0.3–1.2)
Total Protein: 6.7 g/dL (ref 6.5–8.1)

## 2018-06-01 LAB — FERRITIN: Ferritin: 14 ng/mL (ref 11–307)

## 2018-06-01 LAB — FOLATE: Folate: 21 ng/mL (ref >5.9)

## 2018-06-01 NOTE — Progress Notes (Signed)
Patient here today as a new patient with anemia

## 2018-06-01 NOTE — Progress Notes (Signed)
Hematology/Oncology Consult note [facility] [phone] [phone]   Patient Care Team: Virginia Crews, MD as PCP - General (Family Medicine) Samara Deist, DPM as Referring Physician (Podiatry) Samara Deist, DPM as Referring Physician (Podiatry) Schnier, Dolores Lory, MD (Vascular Surgery)  REFERRING PROVIDER: Dr.Tahiliani CHIEF COMPLAINTS/REASON FOR VISIT:  Evaluation of iron deficiency anemia  HISTORY OF PRESENTING ILLNESS:  Claire Strong is a  57 y.o.  female with PMH listed below who was referred to me for evaluation of iron deficiency anemia Patient was recently referred to GI for evaluation of 3 months history of dysphagia with solid food.  Her previous labs showed low normal ferritin level at 15. She has pending work up of EGD and Colonoscopy. Referred to me for further evaluation of iron deficiency.  She has a history of gastric bypass in 2006.  Previous colonoscopy showed hyperplastic polyps.   Associated signs and symptoms: Patient reports fatigue.  Denies SOB with exertion.  Denies weight loss, easy bruising, hematochezia, hemoptysis, hematuria. Context:  History of iron deficiency: Yes, reports history of IV iron infusion.  Rectal bleeding: deneis Menstrual bleeding/ Vaginal bleeding : hysterotomy at age of 19  Hematemesis or hemoptysis : denies Last endoscopy:2006 Fatigue: Yes.  SOB: deneis   # Also history of DVT, after 1st pregnancy, history of positive lupus anticoagulant, per patient, repeat antibody panel was negative. Currently not on anticoagulation, on Aspirin.   History of basal cell carcinoma.    Review of Systems  Constitutional: Positive for fatigue. Negative for appetite change, chills and fever.  HENT:   Negative for hearing loss and voice change.   Eyes: Negative for eye problems.  Respiratory: Negative for chest tightness and cough.   Cardiovascular: Negative for chest pain.    Gastrointestinal: Negative for abdominal distention, abdominal pain and blood in stool.  Endocrine: Negative for hot flashes.  Genitourinary: Negative for difficulty urinating and frequency.   Musculoskeletal: Negative for arthralgias.  Skin: Negative for itching and rash.  Neurological: Negative for extremity weakness.  Hematological: Negative for adenopathy. Bruises/bleeds easily.  Psychiatric/Behavioral: Negative for confusion.    MEDICAL HISTORY:  Past Medical History:  Diagnosis Date  . Anxiety   . Basal cell carcinoma    nose  . Bleeding disorder ([place])   . Bronchial asthma   . Cancer ([place])    melanoma; right knee  . 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 Gigi Gin ortho)  . Chronic venous insufficiency 2007   s/p vein stripping and laser ablation  . Depression   . Diverticulosis    mild by colonoscopy  . History of DVT (deep vein thrombosis)    DVTs after 1st pregnancy, not on AC 2/2 bleeding ulcer, greenfield filter in place  . History of gastric ulcer 2009  . History of kidney stones   . Horseshoe kidney    sole, R kidney damage  . HTN (hypertension)   . Hypothyroidism   . Kidney stones    s/p renal hematoma after lithotripsy on right  . Lupus anticoagulant disorder ([place])   . Multinodular goiter    s/p thyroidectomy  . Nasal septal perforation    chronic, ENT rec avoid antihistamine, INS  . Osteoarthritis    ?FM by rheum  . Overweight   . Post-surgical hypothyroidism    for multinodular goiter  . 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 Frutoso Chase  . Seasonal allergies    allergy shots, singulair  .  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 Tachycardia     SURGICAL HISTORY: Past Surgical History:  Procedure Laterality Date  . ABDOMINAL HERNIA REPAIR    . ABDOMINAL HYSTERECTOMY    . ANKLE FUSION Left 05/2013  . APPENDECTOMY  1978  . BACK SURGERY  2009  . CESAREAN SECTION     X 2  . CHOLECYSTECTOMY  1998  . COLONOSCOPY   05/2012   hyperplastic polyps x2, mild diverticulosis Deatra Ina) rpt 10 yrs  . CYST EXCISION     back  and shoulder  . ESI Right 10/2015, 02/2016   C4/5 then C5/6 ESI ([facility])  . ESI Bilateral 06/2015, 08/2015, 03/2016, 09/2017   L4/5 transforaminal ESI (Chasnis)  . FL HIP INJECTION ([facility]) Left 11/2016   Dr Sharlet Salina  . FUSION OF TALONAVICULAR JOINT Left 09/12/2016   Procedure: FUSION OF TALONAVICULAR JOINT WITH TRINITY BONE GRAFT;  Surgeon: Samara Deist, DPM;  Location: [facility] ORS;  Service: Podiatry;  Laterality: Left;  Marland Kitchen GASTRIC BYPASS  05/21/05   Dr. Frutoso Chase, Roux-en-Y  . Greenfield filter removal  2015   removed 6 wks after surgery  . hammerhead toes Left   . HERNIA REPAIR     X 3  . IVC FILTER INSERTION N/A 09/09/2016   Procedure: IVC Filter Insertion;  Surgeon: Katha Cabal, MD;  Location: [facility];  Service: Cardiovascular;  Laterality: N/A;  . IVC FILTER REMOVAL N/A 11/25/2016   Procedure: IVC Filter Removal;  Surgeon: Katha Cabal, MD;  Location: [facility];  Service: Cardiovascular;  Laterality: N/A;  . JOINT REPLACEMENT    . lyphoma excision  08/2015   arm  . NASAL SEPTUM SURGERY  1981   deviated septum, [facility] at [facility]  . PARTIAL HYSTERECTOMY  1989   ovaries remain  . SKIN SURGERY     basel cell  . TONSILLECTOMY  1969  . TOTAL HIP ARTHROPLASTY Right 11/06/2015   Procedure: TOTAL HIP ARTHROPLASTY ANTERIOR APPROACH;  Surgeon: Hessie Knows, MD;  Location: [facility] ORS;  Service: Orthopedics;  Laterality: Right;  . TOTAL HIP ARTHROPLASTY Left 03/19/2017   Procedure: TOTAL HIP ARTHROPLASTY ANTERIOR APPROACH;  Surgeon: Hessie Knows, MD;  Location: [facility] OR[facility];  Service: Orthopedics;  Laterality: Left;  . TOTAL KNEE ARTHROPLASTY Left 08/2013  . TOTAL THYROIDECTOMY  09/2011   benign path (done for multinodular goiter concern for cancer)    SOCIAL HISTORY: Social History   Socioeconomic History  . Marital status: Married    Spouse name: Gwyndolyn Saxon  .  Number of children: 2  . Years of education: 67  . Highest education level: High school graduate  Occupational History  . Occupation: stay at home  Social Needs  . Financial resource strain: Not on file  . Food insecurity:    Worry: Not on file    Inability: Not on file  . Transportation needs:    Medical: Not on file    Non-medical: Not on file  Tobacco Use  . Smoking status: Former Smoker    Packs/day: 0.25    Types: Cigarettes    Last attempt to quit: 06/16/2004    Years since quitting: 13.9  . Smokeless tobacco: Never Used  . Tobacco comment: Socially-no longer  Substance and Sexual Activity  . Alcohol use: Yes    Alcohol/week: 1.0 standard drinks    Types: 1 Cans of beer per week    Comment: Occasionally-wine and beer  . Drug use: No  . Sexual  activity: Yes    Partners: Male    Birth control/protection: Surgical  Lifestyle  . Physical activity:    Days per week: Not on file    Minutes per session: Not on file  . Stress: Not on file  Relationships  . Social connections:    Talks on phone: Not on file    Gets together: Not on file    Attends religious service: Not on file    Active member of club or organization: Not on file    Attends meetings of clubs or organizations: Not on file    Relationship status: Not on file  . Intimate partner violence:    Fear of current or ex partner: Not on file    Emotionally abused: Not on file    Physically abused: Not on file    Forced sexual activity: Not on file  Other Topics Concern  . Not on file  Social History Narrative  . Not on file    FAMILY HISTORY: Family History  Problem Relation Age of Onset  . Diabetes Mother        prediabetes  . Alzheimer's disease Mother   . Hyperlipidemia Mother   . Diabetes Father   . Coronary artery disease Father 55       CABG  . Alcohol abuse Father   . Lung cancer Father   . Brain cancer Maternal Uncle   . Diabetes Paternal Grandmother   . Lung cancer Brother   .  Alzheimer's disease Brother   . Diabetes Maternal Grandmother   . Stroke Neg Hx   . Colon cancer Neg Hx   . Stomach cancer Neg Hx   . Kidney disease Neg Hx   . Breast cancer Neg Hx   . Kidney cancer Neg Hx   . Bladder Cancer Neg Hx   . Ovarian cancer Neg Hx     ALLERGIES:  is allergic to sulfa antibiotics; cefuroxime axetil; nutritional supplements; other; oxycodone-acetaminophen; and betadine [povidone iodine].  MEDICATIONS:  Current Outpatient Medications  Medication Sig Dispense Refill  . acetaminophen (TYLENOL) 500 MG tablet Take 1,000 mg by mouth 2 (two) times daily.     . ACETAMINOPHEN-BUTALBITAL 50-325 MG TABS Take 1 tablet by mouth at bedtime as needed (sleep). Reported on 09/03/2015 3 times a week    . albuterol (PROVENTIL HFA;VENTOLIN HFA) 108 (90 Base) MCG/ACT inhaler Inhale 2 puffs into the lungs every 6 (six) hours as needed for wheezing or shortness of breath. 1 Inhaler 3  . aspirin 81 MG tablet Take 81 mg by mouth daily.    . benazepril (LOTENSIN) 10 MG tablet TAKE 1 TABLET BY MOUTH ONCE DAILY 90 tablet 2  . bisacodyl (DULCOLAX) 5 MG EC tablet Take 5 mg by mouth daily as needed for moderate constipation.    Marland Kitchen buPROPion (WELLBUTRIN SR) 150 MG 12 hr tablet Take 1 tablet (150 mg total) by mouth 2 (two) times daily. 60 tablet 3  . calcium carbonate (CALCIUM 600) 600 MG TABS tablet Take 1 tablet (600 mg total) 2 (two) times daily with a meal by mouth. 60 tablet   . carisoprodol (SOMA) 350 MG tablet Take 1 tablet (350 mg total) by mouth at bedtime. 90 tablet 1  . cetirizine (ZYRTEC) 10 MG tablet Take 10 mg by mouth daily.    . Cholecalciferol (VITAMIN D3) 1000 UNITS tablet Take 2,000 Units by mouth daily.     . citalopram (CELEXA) 40 MG tablet TAKE ONE TABLET BY MOUTH ONCE DAILY 90  tablet 3  . cyclobenzaprine (FLEXERIL) 10 MG tablet cyclobenzaprine 10 mg tablet    . EPINEPHrine (EPI-PEN) 0.3 mg/0.3 mL DEVI Inject 0.3 mLs (0.3 mg total) into the muscle once. (Patient taking  differently: Inject 0.3 mg into the muscle once. ) 1 Device 2  . estradiol (ESTRACE) 2 MG tablet Take 1 tablet by mouth daily.   0  . Fluticasone-Salmeterol (ADVAIR DISKUS) 100-50 MCG/DOSE AEPB Inhale 1 puff into the lungs 2 (two) times daily. 60 each 11  . furosemide (LASIX) 20 MG tablet Take 1 tablet (20 mg total) by mouth daily. 90 tablet 2  . levothyroxine (SYNTHROID, LEVOTHROID) 150 MCG tablet Take 175 mcg by mouth daily before breakfast.    . liothyronine (CYTOMEL) 5 MCG tablet liothyronine 5 mcg tablet    . metoprolol tartrate (LOPRESSOR) 25 MG tablet TAKE 1 TABLET BY MOUTH TWICE DAILY 180 tablet 3  . montelukast (SINGULAIR) 10 MG tablet Take 10 mg by mouth every morning.     . ondansetron (ZOFRAN) 4 MG tablet Take 1 tablet (4 mg total) by mouth every 8 (eight) hours as needed for nausea or vomiting. 30 tablet 0  . traZODone (DESYREL) 50 MG tablet TAKE 1/2 TO 1 (ONE-HALF TO ONE) TABLET BY MOUTH AT BEDTIME AS NEEDED FOR SLEEP 30 tablet 5  . Turmeric 500 MG CAPS Take 1 capsule by mouth 2 (two) times daily.    . vitamin B-12 (CYANOCOBALAMIN) 500 MCG tablet Take 500 mcg by mouth daily.    Marland Kitchen Zoster Vaccine Adjuvanted [facility]) injection Shingrix (PF) 50 mcg/0.5 mL intramuscular suspension, kit     No current facility-administered medications for this visit.      PHYSICAL EXAMINATION: ECOG PERFORMANCE STATUS: 1 - Symptomatic but completely ambulatory Vitals:   06/01/18 1408  BP: 121/86  Pulse: 63  Resp: 18  Temp: (!) 96.3 F (35.7 C)   Filed Weights   06/01/18 1408  Weight: 235 lb 9 oz (106.9 kg)    Physical Exam Constitutional:      General: She is not in acute distress. HENT:     Head: Normocephalic and atraumatic.  Eyes:     General: No scleral icterus.    Pupils: Pupils are equal, round, and reactive to light.  Neck:     Musculoskeletal: Normal range of motion and neck supple.  Cardiovascular:     Rate and Rhythm: Normal rate and regular rhythm.     Heart sounds:  Normal heart sounds.  Pulmonary:     Effort: Pulmonary effort is normal. No respiratory distress.     Breath sounds: No wheezing.  Abdominal:     General: Bowel sounds are normal. There is no distension.     Palpations: Abdomen is soft. There is no mass.     Tenderness: There is no abdominal tenderness.  Musculoskeletal: Normal range of motion.        General: No deformity.  Skin:    General: Skin is warm and dry.     Findings: No erythema or rash.  Neurological:     Mental Status: She is alert and oriented to person, place, and time.     Cranial Nerves: No cranial nerve deficit.     Coordination: Coordination normal.  Psychiatric:        Behavior: Behavior normal.        Thought Content: Thought content normal.      LABORATORY DATA:  I have reviewed the data as listed Lab Results  Component Value Date  WBC 4.6 06/01/2018   HGB 12.6 06/01/2018   HCT 38.6 06/01/2018   MCV 88.3 06/01/2018   PLT 188 06/01/2018   Recent Labs    02/03/18 1033 05/06/18 0909 06/01/18 1511  NA 140 139 138  K 4.9 4.8 3.9  CL 101 102 106  CO2 '24 21 26  '$ GLUCOSE 82 85 93  BUN '14 13 14  '$ CREATININE 0.88 0.96 0.99  CALCIUM 8.2* 8.3* 8.5*  GFRNONAA 74 66 >60  GFRAA 85 76 >60  PROT 5.9*  --  6.7  ALBUMIN 3.8 3.6 3.8  AST 22  --  21  ALT 20  --  19  ALKPHOS 102  --  71  BILITOT 0.3  --  0.5   Iron/TIBC/Ferritin/ %Sat    Component Value Date/Time   IRON 63 06/01/2018 1511   IRON 54 02/03/2018 1033   TIBC 425 06/01/2018 1511   TIBC 358 02/03/2018 1033   FERRITIN 14 06/01/2018 1511   FERRITIN 16 02/03/2018 1033   IRONPCTSAT 15 06/01/2018 1511   IRONPCTSAT 15 02/03/2018 1033      ASSESSMENT & PLAN:  1. Other iron deficiency anemia   2. History of gastric bypass   3. Dysphagia, unspecified type   4. History of DVT (deep vein thrombosis)    Lab reviewed and dicussed with patient. Low normal ferritin. Patient has a history of gastric bypass, will benefit IV iron infusion  periodically.  Since most recent iron panel was from August 2019, will repeat cbc, iron panel prior to IV iron infusion.  Plan IV iron with Feraheme '510mg'$  weekly x 2. Allergy reactions/infusion reaction including anaphylactic reaction discussed with patient. Other side effects include but not limited to high blood pressure, skin rash, weight gain, leg swelling, etc. Patient voices understanding and willing to proceed.  # History of DVT,  09/03/2016 She had hypercoagulable work up which is negative. No lupus anticoagulant was detected.   # Dysphagia, follow up with GI. Plan EGD and colonoscopy Orders Placed This Encounter  Procedures  . CBC with Differential/Platelet    Standing Status:   Future    Number of Occurrences:   1    Standing Expiration Date:   06/02/2019  . Comprehensive metabolic panel    Standing Status:   Future    Number of Occurrences:   1    Standing Expiration Date:   06/02/2019  . Iron and TIBC    Standing Status:   Future    Number of Occurrences:   1    Standing Expiration Date:   06/02/2019  . Ferritin    Standing Status:   Future    Number of Occurrences:   1    Standing Expiration Date:   06/02/2019  . Folate    Standing Status:   Future    Number of Occurrences:   1    Standing Expiration Date:   06/02/2019  . Vitamin B12    Standing Status:   Future    Number of Occurrences:   1    Standing Expiration Date:   06/02/2019    All questions were answered. The patient knows to call the clinic with any problems questions or concerns.  Return of visit:6 months.  Thank you for this kind referral and the opportunity to participate in the care of this patient. A copy of today's note is routed to referring provider  Total face to face encounter time for this patient visit was 90mn. >50% of the time was  spent in counseling and coordination of care.   Cc Dr.Thalithia and Dr.Bacigalupo Earlie Server, MD, PhD Hematology Oncology [facility] Pager- [phone] 06/01/2018

## 2018-06-02 LAB — VITAMIN B12: Vitamin B-12: 1142 pg/mL — ABNORMAL HIGH (ref 180–914)

## 2018-06-03 ENCOUNTER — Other Ambulatory Visit: Payer: Self-pay | Admitting: Family Medicine

## 2018-06-14 ENCOUNTER — Inpatient Hospital Stay: Payer: BLUE CROSS/BLUE SHIELD

## 2018-06-14 VITALS — BP 104/70 | HR 57 | Temp 95.8°F | Resp 18

## 2018-06-14 DIAGNOSIS — Z9884 Bariatric surgery status: Secondary | ICD-10-CM | POA: Diagnosis not present

## 2018-06-14 DIAGNOSIS — R5383 Other fatigue: Secondary | ICD-10-CM | POA: Diagnosis not present

## 2018-06-14 DIAGNOSIS — D508 Other iron deficiency anemias: Secondary | ICD-10-CM | POA: Diagnosis not present

## 2018-06-14 DIAGNOSIS — Z7982 Long term (current) use of aspirin: Secondary | ICD-10-CM | POA: Diagnosis not present

## 2018-06-14 DIAGNOSIS — R1310 Dysphagia, unspecified: Secondary | ICD-10-CM | POA: Diagnosis not present

## 2018-06-14 DIAGNOSIS — Z87891 Personal history of nicotine dependence: Secondary | ICD-10-CM | POA: Diagnosis not present

## 2018-06-14 DIAGNOSIS — Z79899 Other long term (current) drug therapy: Secondary | ICD-10-CM | POA: Diagnosis not present

## 2018-06-14 DIAGNOSIS — Z86718 Personal history of other venous thrombosis and embolism: Secondary | ICD-10-CM | POA: Diagnosis not present

## 2018-06-14 DIAGNOSIS — Z85820 Personal history of malignant melanoma of skin: Secondary | ICD-10-CM | POA: Diagnosis not present

## 2018-06-14 MED ORDER — SODIUM CHLORIDE 0.9 % IV SOLN
510.0000 mg | Freq: Once | INTRAVENOUS | Status: AC
  Administered 2018-06-14: 510 mg via INTRAVENOUS
  Filled 2018-06-14: qty 17

## 2018-06-14 MED ORDER — SODIUM CHLORIDE 0.9 % IV SOLN
Freq: Once | INTRAVENOUS | Status: AC
  Administered 2018-06-14: 14:00:00 via INTRAVENOUS
  Filled 2018-06-14: qty 250

## 2018-06-21 ENCOUNTER — Inpatient Hospital Stay: Payer: BLUE CROSS/BLUE SHIELD | Attending: Oncology

## 2018-06-21 VITALS — BP 110/77 | HR 77 | Temp 98.0°F | Resp 20

## 2018-06-21 DIAGNOSIS — D508 Other iron deficiency anemias: Secondary | ICD-10-CM | POA: Diagnosis not present

## 2018-06-21 DIAGNOSIS — Z9884 Bariatric surgery status: Secondary | ICD-10-CM

## 2018-06-21 MED ORDER — SODIUM CHLORIDE 0.9 % IV SOLN
510.0000 mg | Freq: Once | INTRAVENOUS | Status: AC
  Administered 2018-06-21: 510 mg via INTRAVENOUS
  Filled 2018-06-21: qty 17

## 2018-06-21 MED ORDER — SODIUM CHLORIDE 0.9 % IV SOLN
Freq: Once | INTRAVENOUS | Status: AC
  Administered 2018-06-21: 14:00:00 via INTRAVENOUS
  Filled 2018-06-21: qty 250

## 2018-06-26 ENCOUNTER — Other Ambulatory Visit: Payer: Self-pay | Admitting: Family Medicine

## 2018-06-29 ENCOUNTER — Other Ambulatory Visit: Payer: Self-pay | Admitting: *Deleted

## 2018-06-29 MED ORDER — METOPROLOL TARTRATE 25 MG PO TABS: 25.0000 mg | ORAL_TABLET | Freq: Two times a day (BID) | ORAL | 3 refills | Status: DC

## 2018-07-01 ENCOUNTER — Other Ambulatory Visit: Payer: Self-pay | Admitting: Physician Assistant

## 2018-07-01 ENCOUNTER — Encounter: Payer: Self-pay | Admitting: Emergency Medicine

## 2018-07-01 ENCOUNTER — Ambulatory Visit: Payer: BLUE CROSS/BLUE SHIELD | Admitting: Anesthesiology

## 2018-07-01 ENCOUNTER — Ambulatory Visit
Admission: RE | Admit: 2018-07-01 | Discharge: 2018-07-01 | Disposition: A | Discharge status: Home / Self Care | Payer: BLUE CROSS/BLUE SHIELD | Attending: Gastroenterology | Admitting: Gastroenterology

## 2018-07-01 ENCOUNTER — Encounter
Admission: RE | Disposition: A | Discharge status: Home / Self Care | Payer: Self-pay | Source: Home / Self Care | Attending: Gastroenterology

## 2018-07-01 ENCOUNTER — Other Ambulatory Visit: Payer: Self-pay | Admitting: Family Medicine

## 2018-07-01 DIAGNOSIS — N189 Chronic kidney disease, unspecified: Secondary | ICD-10-CM | POA: Insufficient documentation

## 2018-07-01 DIAGNOSIS — E890 Postprocedural hypothyroidism: Secondary | ICD-10-CM | POA: Diagnosis not present

## 2018-07-01 DIAGNOSIS — Z96643 Presence of artificial hip joint, bilateral: Secondary | ICD-10-CM | POA: Insufficient documentation

## 2018-07-01 DIAGNOSIS — Z87442 Personal history of urinary calculi: Secondary | ICD-10-CM | POA: Diagnosis not present

## 2018-07-01 DIAGNOSIS — D120 Benign neoplasm of cecum: Secondary | ICD-10-CM | POA: Diagnosis not present

## 2018-07-01 DIAGNOSIS — D508 Other iron deficiency anemias: Secondary | ICD-10-CM

## 2018-07-01 DIAGNOSIS — K228 Other specified diseases of esophagus: Secondary | ICD-10-CM | POA: Insufficient documentation

## 2018-07-01 DIAGNOSIS — I129 Hypertensive chronic kidney disease with stage 1 through stage 4 chronic kidney disease, or unspecified chronic kidney disease: Secondary | ICD-10-CM | POA: Insufficient documentation

## 2018-07-01 DIAGNOSIS — K5730 Diverticulosis of large intestine without perforation or abscess without bleeding: Secondary | ICD-10-CM | POA: Diagnosis not present

## 2018-07-01 DIAGNOSIS — R1310 Dysphagia, unspecified: Secondary | ICD-10-CM | POA: Diagnosis not present

## 2018-07-01 DIAGNOSIS — Z7982 Long term (current) use of aspirin: Secondary | ICD-10-CM | POA: Insufficient documentation

## 2018-07-01 DIAGNOSIS — Z87891 Personal history of nicotine dependence: Secondary | ICD-10-CM | POA: Diagnosis not present

## 2018-07-01 DIAGNOSIS — Z79899 Other long term (current) drug therapy: Secondary | ICD-10-CM | POA: Diagnosis not present

## 2018-07-01 DIAGNOSIS — Z96652 Presence of left artificial knee joint: Secondary | ICD-10-CM | POA: Diagnosis not present

## 2018-07-01 DIAGNOSIS — Z79890 Hormone replacement therapy (postmenopausal): Secondary | ICD-10-CM | POA: Diagnosis not present

## 2018-07-01 DIAGNOSIS — J45909 Unspecified asthma, uncomplicated: Secondary | ICD-10-CM | POA: Insufficient documentation

## 2018-07-01 DIAGNOSIS — F419 Anxiety disorder, unspecified: Secondary | ICD-10-CM | POA: Insufficient documentation

## 2018-07-01 DIAGNOSIS — Z86718 Personal history of other venous thrombosis and embolism: Secondary | ICD-10-CM | POA: Insufficient documentation

## 2018-07-01 DIAGNOSIS — Z7951 Long term (current) use of inhaled steroids: Secondary | ICD-10-CM | POA: Insufficient documentation

## 2018-07-01 DIAGNOSIS — Z85820 Personal history of malignant melanoma of skin: Secondary | ICD-10-CM | POA: Diagnosis not present

## 2018-07-01 DIAGNOSIS — D122 Benign neoplasm of ascending colon: Secondary | ICD-10-CM

## 2018-07-01 DIAGNOSIS — K200 Eosinophilic esophagitis: Secondary | ICD-10-CM

## 2018-07-01 DIAGNOSIS — F329 Major depressive disorder, single episode, unspecified: Secondary | ICD-10-CM | POA: Diagnosis not present

## 2018-07-01 DIAGNOSIS — E663 Overweight: Secondary | ICD-10-CM | POA: Diagnosis not present

## 2018-07-01 DIAGNOSIS — D509 Iron deficiency anemia, unspecified: Secondary | ICD-10-CM | POA: Diagnosis not present

## 2018-07-01 DIAGNOSIS — R1319 Other dysphagia: Secondary | ICD-10-CM

## 2018-07-01 DIAGNOSIS — Z6833 Body mass index (BMI) 33.0-33.9, adult: Secondary | ICD-10-CM | POA: Diagnosis not present

## 2018-07-01 DIAGNOSIS — I739 Peripheral vascular disease, unspecified: Secondary | ICD-10-CM | POA: Insufficient documentation

## 2018-07-01 DIAGNOSIS — Z9884 Bariatric surgery status: Secondary | ICD-10-CM | POA: Insufficient documentation

## 2018-07-01 DIAGNOSIS — M797 Fibromyalgia: Secondary | ICD-10-CM | POA: Insufficient documentation

## 2018-07-01 HISTORY — PX: ESOPHAGOGASTRODUODENOSCOPY (EGD) WITH PROPOFOL: SHX5813

## 2018-07-01 HISTORY — PX: COLONOSCOPY WITH PROPOFOL: SHX5780

## 2018-07-01 SURGERY — COLONOSCOPY WITH PROPOFOL
Anesthesia: General

## 2018-07-01 MED ORDER — PROPOFOL 10 MG/ML IV BOLUS
INTRAVENOUS | Status: DC | PRN
  Administered 2018-07-01: 40 mg via INTRAVENOUS
  Administered 2018-07-01: 10 mg via INTRAVENOUS
  Administered 2018-07-01: 80 mg via INTRAVENOUS
  Administered 2018-07-01: 30 mg via INTRAVENOUS

## 2018-07-01 MED ORDER — PROPOFOL 500 MG/50ML IV EMUL
INTRAVENOUS | Status: AC
  Filled 2018-07-01: qty 50

## 2018-07-01 MED ORDER — ONDANSETRON HCL 4 MG/2ML IJ SOLN
INTRAMUSCULAR | Status: DC | PRN
  Administered 2018-07-01: 4 mg via INTRAVENOUS

## 2018-07-01 MED ORDER — EPHEDRINE SULFATE 50 MG/ML IJ SOLN
INTRAMUSCULAR | Status: DC | PRN
  Administered 2018-07-01: 10 mg via INTRAVENOUS

## 2018-07-01 MED ORDER — PROPOFOL 500 MG/50ML IV EMUL
INTRAVENOUS | Status: DC | PRN
  Administered 2018-07-01: 140 ug/kg/min via INTRAVENOUS

## 2018-07-01 MED ORDER — PROPOFOL 10 MG/ML IV BOLUS
INTRAVENOUS | Status: AC
  Filled 2018-07-01: qty 20

## 2018-07-01 MED ORDER — LIDOCAINE HCL (CARDIAC) PF 100 MG/5ML IV SOSY
PREFILLED_SYRINGE | INTRAVENOUS | Status: DC | PRN
  Administered 2018-07-01: 100 mg via INTRAVENOUS

## 2018-07-01 MED ORDER — SODIUM CHLORIDE 0.9 % IV SOLN
INTRAVENOUS | Status: DC
  Administered 2018-07-01: 1000 mL via INTRAVENOUS

## 2018-07-01 MED ORDER — PHENYLEPHRINE HCL 10 MG/ML IJ SOLN
INTRAMUSCULAR | Status: DC | PRN
  Administered 2018-07-01 (×4): 80 ug via INTRAVENOUS

## 2018-07-01 NOTE — Transfer of Care (Signed)
Immediate Anesthesia Transfer of Care Note  Patient: Claire Strong  Procedure(s) Performed: COLONOSCOPY WITH PROPOFOL (N/A ) ESOPHAGOGASTRODUODENOSCOPY (EGD) WITH PROPOFOL (N/A )  Patient Location: PACU  Anesthesia Type:General  Level of Consciousness: sedated  Airway & Oxygen Therapy: Patient Spontanous Breathing and Patient connected to nasal cannula oxygen  Post-op Assessment: Report given to RN  Post vital signs: Reviewed and stable  Last Vitals:  Vitals Value Taken Time  BP 102/63 07/01/2018  9:30 AM  Temp 36.3 C 07/01/2018  9:29 AM  Pulse 121 07/01/2018  9:33 AM  Resp 16 07/01/2018  9:34 AM  SpO2 99 % 07/01/2018  9:33 AM  Vitals shown include unvalidated device data.  Last Pain:  Vitals:   07/01/18 0929  TempSrc: Tympanic  PainSc:          Complications: No apparent anesthesia complications

## 2018-07-01 NOTE — H&P (Signed)
Claire Antigua, MD [address], [place], [place], [place], [address] [address], [place], [place], [place], [address] Phone: [phone]  Fax: [phone]  Primary Care Physician:  Virginia Crews, MD   Pre-Procedure History & Physical: HPI:  Claire Strong is a 58 y.o. female is here for a colonoscopy and EGD.   Past Medical History:  Diagnosis Date  . Anxiety   . Basal cell carcinoma    nose  . Bleeding disorder ([facility])   . Bronchial asthma   . Cancer ([place])    melanoma; right knee  . 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 Gigi Gin ortho)  . Chronic venous insufficiency 2007   s/p vein stripping and laser ablation  . Depression   . Diverticulosis    mild by colonoscopy  . History of DVT (deep vein thrombosis)    DVTs after 1st pregnancy, not on AC 2/2 bleeding ulcer, greenfield filter in place  . History of gastric ulcer 2009  . History of kidney stones   . Horseshoe kidney    sole, R kidney damage  . HTN (hypertension)   . Hypothyroidism   . Kidney stones    s/p renal hematoma after lithotripsy on right  . Lupus anticoagulant disorder ([place])   . Multinodular goiter    s/p thyroidectomy  . Nasal septal perforation    chronic, ENT rec avoid antihistamine, INS  . Osteoarthritis    ?FM by rheum  . Overweight   . Post-surgical hypothyroidism    for multinodular goiter  . 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 Frutoso Chase  . Seasonal allergies    allergy shots, singulair  . 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 Tachycardia     Past Surgical History:  Procedure Laterality Date  . ABDOMINAL HERNIA REPAIR    . ABDOMINAL HYSTERECTOMY    . ANKLE FUSION Left 05/2013  . APPENDECTOMY  1978  . BACK SURGERY  2009  . CESAREAN SECTION     X 2  . CHOLECYSTECTOMY  1998  . COLONOSCOPY  05/2012   hyperplastic polyps x2, mild diverticulosis Deatra Ina) rpt 10 yrs  . CYST EXCISION     back  and shoulder  . ESI Right 10/2015, 02/2016     C4/5 then C5/6 ESI ([facility])  . ESI Bilateral 06/2015, 08/2015, 03/2016, 09/2017   L4/5 transforaminal ESI ([facility])  . FL HIP INJECTION ([facility] HX) Left 11/2016   Dr Sharlet Salina  . FUSION OF TALONAVICULAR JOINT Left 09/12/2016   Procedure: FUSION OF TALONAVICULAR JOINT WITH TRINITY BONE GRAFT;  Surgeon: Samara Deist, DPM;  Location: [facility] ORS;  Service: Podiatry;  Laterality: Left;  Marland Kitchen GASTRIC BYPASS  05/21/05   Dr. Frutoso Chase, Roux-en-Y  . Greenfield filter removal  2015   removed 6 wks after surgery  . hammerhead toes Left   . HERNIA REPAIR     X 3  . IVC FILTER INSERTION N/A 09/09/2016   Procedure: IVC Filter Insertion;  Surgeon: Katha Cabal, MD;  Location: [facility];  Service: Cardiovascular;  Laterality: N/A;  . IVC FILTER REMOVAL N/A 11/25/2016   Procedure: IVC Filter Removal;  Surgeon: Katha Cabal, MD;  Location: [facility];  Service: Cardiovascular;  Laterality: N/A;  . JOINT REPLACEMENT    . lyphoma excision  08/2015   arm  . NASAL SEPTUM SURGERY  1981   deviated septum, repaired at [facility]  . PARTIAL HYSTERECTOMY  1989   ovaries remain  . SKIN SURGERY     basel cell  . TONSILLECTOMY  1969  . TOTAL HIP ARTHROPLASTY Right 11/06/2015   Procedure: TOTAL HIP ARTHROPLASTY ANTERIOR APPROACH;  Surgeon: Hessie Knows, MD;  Location: [facility] ORS;  Service: Orthopedics;  Laterality: Right;  . TOTAL HIP ARTHROPLASTY Left 03/19/2017   Procedure: TOTAL HIP ARTHROPLASTY ANTERIOR APPROACH;  Surgeon: Hessie Knows, MD;  Location: [facility] ORS;  Service: Orthopedics;  Laterality: Left;  . TOTAL KNEE ARTHROPLASTY Left 08/2013  . TOTAL THYROIDECTOMY  09/2011   benign path (done for multinodular goiter concern for cancer)    Prior to Admission medications   Medication Sig Start Date End Date Taking? Authorizing Provider  acetaminophen (TYLENOL) 500 MG tablet Take 1,000 mg by mouth 2 (two) times daily.    Yes [provider]  ACETAMINOPHEN-BUTALBITAL 50-325 MG TABS Take 1  tablet by mouth at bedtime as needed (sleep). Reported on 09/03/2015 3 times a week   Yes [provider]  albuterol (PROVENTIL HFA;VENTOLIN HFA) 108 (90 Base) MCG/ACT inhaler Inhale 2 puffs into the lungs every 6 (six) hours as needed for wheezing or shortness of breath. 06/01/17  Yes Ria Bush, MD  aspirin 81 MG tablet Take 81 mg by mouth daily.   Yes [provider]  benazepril (LOTENSIN) 10 MG tablet TAKE 1 TABLET BY MOUTH ONCE DAILY 09/28/17  Yes Ria Bush, MD  bisacodyl (DULCOLAX) 5 MG EC tablet Take 5 mg by mouth daily as needed for moderate constipation.   Yes [provider]  buPROPion (WELLBUTRIN SR) 150 MG 12 hr tablet Take 1 tablet (150 mg total) by mouth 2 (two) times daily. 02/03/18  Yes Bacigalupo, Dionne Bucy, MD  calcium carbonate (CALCIUM 600) 600 MG TABS tablet Take 1 tablet (600 mg total) 2 (two) times daily with a meal by mouth. 05/02/17  Yes Ria Bush, MD  carisoprodol (SOMA) 350 MG tablet Take 1 tablet (350 mg total) by mouth at bedtime. 11/19/17  Yes Bacigalupo, Dionne Bucy, MD  cetirizine (ZYRTEC) 10 MG tablet Take 10 mg by mouth daily.   Yes [provider]  Cholecalciferol (VITAMIN D3) 1000 UNITS tablet Take 2,000 Units by mouth daily.    Yes [provider]  citalopram (CELEXA) 40 MG tablet TAKE ONE TABLET BY MOUTH ONCE DAILY 06/29/17  Yes Ria Bush, MD  cyclobenzaprine (FLEXERIL) 10 MG tablet cyclobenzaprine 10 mg tablet 10/12/17  Yes [provider]  EPINEPHrine (EPI-PEN) 0.3 mg/0.3 mL DEVI Inject 0.3 mLs (0.3 mg total) into the muscle once. Patient taking differently: Inject 0.3 mg into the muscle once.  12/19/10  Yes Ria Bush, MD  estradiol (ESTRACE) 2 MG tablet Take 1 tablet by mouth daily.  04/24/17  Yes [provider]  Fluticasone-Salmeterol (ADVAIR DISKUS) 100-50 MCG/DOSE AEPB Inhale 1 puff into the lungs 2 (two) times daily. 06/01/17  Yes Ria Bush, MD  furosemide  (LASIX) 20 MG tablet Take 1 tablet (20 mg total) by mouth daily. 04/12/18  Yes Bacigalupo, Dionne Bucy, MD  levothyroxine (SYNTHROID, LEVOTHROID) 150 MCG tablet Take 175 mcg by mouth daily before breakfast.   Yes [provider]  liothyronine (CYTOMEL) 5 MCG tablet liothyronine 5 mcg tablet   Yes [provider]  metoprolol tartrate (LOPRESSOR) 25 MG tablet TAKE 1 TABLET BY MOUTH TWICE DAILY 06/29/18  Yes Bacigalupo, Dionne Bucy, MD  metoprolol tartrate (LOPRESSOR) 25 MG tablet Take 1 tablet (25 mg total) by mouth 2 (two) times daily. 06/29/18  Yes  Minna Merritts, MD  montelukast (SINGULAIR) 10 MG tablet Take 10 mg by mouth every morning.    Yes [provider]  ondansetron (ZOFRAN) 4 MG tablet Take 1 tablet (4 mg total) by mouth every 8 (eight) hours as needed for nausea or vomiting. 03/21/17  Yes Reche Dixon, PA-C  traZODone (DESYREL) 50 MG tablet TAKE 1/2 TO 1 (ONE-HALF TO ONE) TABLET BY MOUTH AT BEDTIME AS NEEDED FOR SLEEP 06/04/18  Yes Pollak, Adriana M, PA-C  Turmeric 500 MG CAPS Take 1 capsule by mouth 2 (two) times daily.   Yes [provider]  vitamin B-12 (CYANOCOBALAMIN) 500 MCG tablet Take 500 mcg by mouth daily.   Yes [provider]  Zoster Vaccine Adjuvanted [facility]) injection Shingrix (PF) 50 mcg/0.5 mL intramuscular suspension, kit    [provider]    Allergies as of 05/25/2018 - Review Complete 05/25/2018  Allergen Reaction Noted  . Sulfa antibiotics Shortness Of Breath 12/19/2010  . Cefuroxime axetil Other (See Comments) 09/03/2015  . Nutritional supplements Swelling 12/19/2010  . Other  03/19/2017  . Oxycodone-acetaminophen Nausea And Vomiting 09/03/2015  . Betadine [povidone iodine] Rash 03/08/2016    Family History  Problem Relation Age of Onset  . Diabetes Mother        prediabetes  . Alzheimer's disease Mother   . Hyperlipidemia Mother   . Diabetes Father   . Coronary artery disease Father 70       CABG  .  Alcohol abuse Father   . Lung cancer Father   . Brain cancer Maternal Uncle   . Diabetes Paternal Grandmother   . Lung cancer Brother   . Alzheimer's disease Brother   . Diabetes Maternal Grandmother   . Stroke Neg Hx   . Colon cancer Neg Hx   . Stomach cancer Neg Hx   . Kidney disease Neg Hx   . Breast cancer Neg Hx   . Kidney cancer Neg Hx   . Bladder Cancer Neg Hx   . Ovarian cancer Neg Hx     Social History   Socioeconomic History  . Marital status: Married    Spouse name: Gwyndolyn Saxon  . Number of children: 2  . Years of education: 14  . Highest education level: High school graduate  Occupational History  . Occupation: stay at home  Social Needs  . Financial resource strain: Not on file  . Food insecurity:    Worry: Not on file    Inability: Not on file  . Transportation needs:    Medical: Not on file    Non-medical: Not on file  Tobacco Use  . Smoking status: Former Smoker    Packs/day: 0.25    Types: Cigarettes    Last attempt to quit: 06/16/2004    Years since quitting: 14.0  . Smokeless tobacco: Never Used  . Tobacco comment: Socially-no longer  Substance and Sexual Activity  . Alcohol use: Yes    Alcohol/week: 1.0 standard drinks    Types: 1 Cans of beer per week    Comment: Occasionally-wine and beer  . Drug use: No  . Sexual activity: Yes    Partners: Male    Birth control/protection: Surgical  Lifestyle  . Physical activity:    Days per week: Not on file    Minutes per session: Not on file  . Stress: Not on file  Relationships  . Social connections:    Talks on phone: Not on file    Gets together: Not on file  Attends religious service: Not on file    Active member of club or organization: Not on file    Attends meetings of clubs or organizations: Not on file    Relationship status: Not on file  . Intimate partner violence:    Fear of current or ex partner: Not on file    Emotionally abused: Not on file    Physically abused: Not on file     Forced sexual activity: Not on file  Other Topics Concern  . Not on file  Social History Narrative  . Not on file    Review of Systems: See HPI, otherwise negative ROS  Physical Exam: BP 119/81   Pulse 66   Temp (!) 95.8 F (35.4 C) (Tympanic)   Resp 16   Ht _0  (1.727 m)   Wt 101.2 kg   SpO2 100%   BMI 33.91 kg/m  General:   Alert,  pleasant and cooperative in NAD Head:  Normocephalic and atraumatic. Neck:  Supple; no masses or thyromegaly. Lungs:  Clear throughout to auscultation, normal respiratory effort.    Heart:  +S1, +S2, Regular rate and rhythm, No edema. Abdomen:  Soft, nontender and nondistended. Normal bowel sounds, without guarding, and without rebound.   Neurologic:  Alert and  oriented x4;  grossly normal neurologically.  Impression/Plan: Claire Strong is here for a colonoscopy to be performed for iron deficiency anemia and EGD for dysphagia.  Risks, benefits, limitations, and alternatives regarding the procedures have been reviewed with the patient.  Questions have been answered.  All parties agreeable.   Virgel Manifold, MD  07/01/2018, 8:10 AM

## 2018-07-01 NOTE — Anesthesia Postprocedure Evaluation (Signed)
Anesthesia Post Note  Patient: SHERRICE CREEKMORE  Procedure(s) Performed: COLONOSCOPY WITH PROPOFOL (N/A ) ESOPHAGOGASTRODUODENOSCOPY (EGD) WITH PROPOFOL (N/A )  Patient location during evaluation: PACU Anesthesia Type: General Level of consciousness: awake and alert Pain management: pain level controlled Vital Signs Assessment: post-procedure vital signs reviewed and stable Respiratory status: spontaneous breathing, nonlabored ventilation, respiratory function stable and patient connected to nasal cannula oxygen Cardiovascular status: blood pressure returned to baseline and stable Postop Assessment: no apparent nausea or vomiting Anesthetic complications: no     Last Vitals:  Vitals:   07/01/18 0741 07/01/18 0929  BP: 119/81 102/63  Pulse: 66   Resp: 16   Temp: (!) 35.4 C (!) 36.3 C  SpO2: 100%     Last Pain:  Vitals:   07/01/18 0929  TempSrc: Tympanic  PainSc:                  Jovita Gamma

## 2018-07-01 NOTE — Anesthesia Post-op Follow-up Note (Signed)
Anesthesia QCDR form completed.        

## 2018-07-01 NOTE — Op Note (Signed)
[facility] Gastroenterology Patient Name: Claire Strong Procedure Date: 07/01/2018 7:19 AM MRN: [id number] Account #: [id number] Date of Birth: 05-Jul-1960 Admit Type: Outpatient Age: 58 Room: [facility] ENDO ROOM 4 Gender: Female Note Status: Finalized Procedure:            Upper GI endoscopy Indications:          Iron deficiency anemia, Dysphagia Providers:             B. Maximino Greenland MD, MD Referring MD:         Marzella Schlein. Bacigalupo (Referring MD) Medicines:            Monitored Anesthesia Care Complications:        No immediate complications. Procedure:            Pre-Anesthesia Assessment:                       - Prior to the procedure, a History and Physical was                        performed, and patient medications, allergies and                        sensitivities were reviewed. The patient's tolerance of                        previous anesthesia was reviewed.                       - The risks and benefits of the procedure and the                        sedation options and risks were discussed with the                        patient. All questions were answered and informed                        consent was obtained.                       - Patient identification and proposed procedure were                        verified prior to the procedure by the physician, the                        nurse, the anesthesiologist, the anesthetist and the                        technician. The procedure was verified in the procedure                        room.                       - ASA Grade Assessment: II - A patient with mild                        systemic disease.  After obtaining informed consent, the endoscope was                        passed under direct vision. Throughout the procedure,                        the patient's blood pressure, pulse, and oxygen                        saturations were monitored continuously. The Endoscope                         was introduced through the mouth, and advanced to the                        jejunum. The upper GI endoscopy was accomplished with                        ease. The patient tolerated the procedure well. Findings:      Mucosal changes including feline appearance were found in the entire       esophagus. Biopsies were obtained from the proximal and distal esophagus       with cold forceps for histology of suspected eosinophilic esophagitis.      There is no endoscopic evidence of inflammation, stenosis or stricture       in the entire esophagus.      The entire examined stomach was normal.      Evidence of a Roux-en-Y anastomosis was found in the stomach. This was       characterized by healthy appearing mucosa. Previous staple sites seen in       the stomach as well.      The examined jejunum was normal. Impression:           - Esophageal mucosal changes suggestive of eosinophilic                        esophagitis. Biopsied.                       - Normal stomach.                       - A Roux-en-Y anastomosis was found, characterized by                        healthy appearing mucosa.                       - Normal examined jejunum. Recommendation:       - Discharge patient to home (with escort).                       - Advance diet as tolerated.                       - Continue present medications.                       - Patient has a contact number available for                        emergencies. The signs and  symptoms of potential                        delayed complications were discussed with the patient.                        Return to normal activities tomorrow. Written discharge                        instructions were provided to the patient.                       - Discharge patient to home (with escort).                       - The findings and recommendations were discussed with                        the patient.                       - The  findings and recommendations were discussed with                        the patient's family.                       - Await pathology results. Procedure Code(s):    --- Professional ---                       [phone], Esophagogastroduodenoscopy, flexible, transoral;                        with biopsy, single or multiple Diagnosis Code(s):    --- Professional ---                       K22.8, Other specified diseases of esophagus                       Z98.84, Bariatric surgery status                       D50.9, Iron deficiency anemia, unspecified                       R13.10, Dysphagia, unspecified CPT copyright 2018 American Medical Association. All rights reserved. The codes documented in this report are preliminary and upon coder review may  be revised to meet current compliance requirements.  [name], MD Michel Bickers B. Maximino Greenland MD, MD 07/01/2018 9:33:54 AM This report has been signed electronically. Number of Addenda: 0 Note Initiated On: 07/01/2018 7:19 AM Estimated Blood Loss: Estimated blood loss: none.      [facility]

## 2018-07-01 NOTE — Anesthesia Preprocedure Evaluation (Addendum)
Anesthesia Evaluation  Patient identified by MRN, date of birth, ID band Patient awake    Reviewed: Allergy & Precautions, H&P , NPO status , Patient's Chart, lab work & pertinent test results  History of Anesthesia Complications (+) PONV and history of anesthetic complications  Airway Mallampati: III  TM Distance: >3 FB     Dental  (+) Chipped   Pulmonary asthma , former smoker,           Cardiovascular hypertension, + Peripheral Vascular Disease       Neuro/Psych  Headaches, PSYCHIATRIC DISORDERS Anxiety Depression    GI/Hepatic negative GI ROS, Neg liver ROS,   Endo/Other  Hypothyroidism   Renal/GU CRFRenal disease  negative genitourinary   Musculoskeletal  (+) Arthritis , Fibromyalgia -  Abdominal   Peds  Hematology  (+) Blood dyscrasia, anemia , Lupus anticoagulation H/o DVT   Anesthesia Other Findings Past Medical History: No date: Anxiety No date: Basal cell carcinoma     Comment:  nose No date: Bleeding disorder ([facility]) No date: Bronchial asthma No date: Cancer [facility])     Comment:  melanoma; right knee No date: Cavovarus deformity of foot     Comment:  bilateral, with L 5th bunionette (Dr. Trula Ore ortho) 2007: Chronic venous insufficiency     Comment:  s/p vein stripping and laser ablation No date: Depression No date: Diverticulosis     Comment:  mild by colonoscopy No date: History of DVT (deep vein thrombosis)     Comment:  DVTs after 1st pregnancy, not on AC 2/2 bleeding ulcer,               greenfield filter in place 2009: History of gastric ulcer No date: History of kidney stones No date: Horseshoe kidney     Comment:  sole, R kidney damage No date: HTN (hypertension) No date: Hypothyroidism No date: Kidney stones     Comment:  s/p renal hematoma after lithotripsy on right No date: Lupus anticoagulant disorder ([facility]) No date: Multinodular goiter     Comment:  s/p thyroidectomy No  date: Nasal septal perforation     Comment:  chronic, ENT rec avoid antihistamine, INS No date: Osteoarthritis     Comment:  ?FM by rheum No date: Overweight No date: Post-surgical hypothyroidism     Comment:  for multinodular goiter 05/21/2005: S/P gastric bypass     Comment:  Dr. Jacqulyn Ducking No date: Seasonal allergies     Comment:  allergy shots, singulair No date: Sight deterioration     Comment:  disc in back No date: Tachycardia  Past Surgical History: No date: ABDOMINAL HERNIA REPAIR No date: ABDOMINAL HYSTERECTOMY 05/2013: ANKLE FUSION; Left 1978: APPENDECTOMY 2009: BACK SURGERY No date: CESAREAN SECTION     Comment:  X 2 1998: CHOLECYSTECTOMY 05/2012: COLONOSCOPY     Comment:  hyperplastic polyps x2, mild diverticulosis Arlyce Dice) rpt              10 yrs No date: CYST EXCISION     Comment:  back  and shoulder 10/2015, 02/2016: ESI; Right     Comment:  C4/5 then C5/6 ESI ([facility]) 06/2015, 08/2015, 03/2016, 09/2017: ESI; Bilateral     Comment:  L4/5 transforaminal ESI ([facility]) 11/2016: FL HIP INJECTION ([facility] HX); Left     Comment:  Dr Yves Dill 09/12/2016: FUSION OF TALONAVICULAR JOINT; Left     Comment:  Procedure: FUSION OF TALONAVICULAR JOINT WITH TRINITY  BONE GRAFT;  Surgeon: Gwyneth Revels, DPM;  Location: [facility]              ORS;  Service: Podiatry;  Laterality: Left; 05/21/05: GASTRIC BYPASS     Comment:  Dr. Jacqulyn Ducking, Roux-en-Y 2015: Greenfield filter removal     Comment:  removed 6 wks after surgery No date: hammerhead toes; Left No date: HERNIA REPAIR     Comment:  X 3 09/09/2016: IVC FILTER INSERTION; N/A     Comment:  Procedure: IVC Filter Insertion;  Surgeon: Renford Dills, MD;  Location: [facility] INVASIVE CV LAB;  Service:               Cardiovascular;  Laterality: N/A; 11/25/2016: IVC FILTER REMOVAL; N/A     Comment:  Procedure: IVC Filter Removal;  Surgeon: Renford Dills, MD;  Location: [facility] INVASIVE CV LAB;   Service:              Cardiovascular;  Laterality: N/A; No date: JOINT REPLACEMENT 08/2015: lyphoma excision     Comment:  arm 1981: NASAL SEPTUM SURGERY     Comment:  deviated septum, repaired at [facility] 1989: PARTIAL HYSTERECTOMY     Comment:  ovaries remain No date: SKIN SURGERY     Comment:  basel cell 1969: TONSILLECTOMY 11/06/2015: TOTAL HIP ARTHROPLASTY; Right     Comment:  Procedure: TOTAL HIP ARTHROPLASTY ANTERIOR APPROACH;                Surgeon: Kennedy Bucker, MD;  Location: [facility] ORS;  Service:              Orthopedics;  Laterality: Right; 03/19/2017: TOTAL HIP ARTHROPLASTY; Left     Comment:  Procedure: TOTAL HIP ARTHROPLASTY ANTERIOR APPROACH;                Surgeon: Kennedy Bucker, MD;  Location: [facility] ORS;                Service: Orthopedics;  Laterality: Left; 08/2013: TOTAL KNEE ARTHROPLASTY; Left 09/2011: TOTAL THYROIDECTOMY     Comment:  benign path (done for multinodular goiter concern for               cancer)     Reproductive/Obstetrics negative OB ROS                           Anesthesia Physical Anesthesia Plan  ASA: III  Anesthesia Plan: General   Post-op Pain Management:    Induction:   PONV Risk Score and Plan: Propofol infusion, TIVA and Ondansetron  Airway Management Planned: Natural Airway and Nasal Cannula  Additional Equipment:   Intra-op Plan:   Post-operative Plan:   Informed Consent: I have reviewed the patients History and Physical, chart, labs and discussed the procedure including the risks, benefits and alternatives for the proposed anesthesia with the patient or authorized representative who has indicated his/her understanding and acceptance.     Dental Advisory Given  Plan Discussed with: Anesthesiologist  Anesthesia Plan Comments:        Anesthesia Quick Evaluation

## 2018-07-01 NOTE — Op Note (Signed)
[facility] Patient Name: Claire Strong Procedure Date: 07/01/2018 7:19 AM MRN: [id number] Account #: [id number] Date of Birth: Aug 23, 1960 Admit Type: Outpatient Age: 58 Room: [facility] ENDO ROOM 4 Gender: Female Note Status: Finalized Procedure:            Colonoscopy Indications:          Iron deficiency anemia Providers:            Kerith Sherley B. Maximino Greenland MD, MD Referring MD:         Marzella Schlein. Bacigalupo (Referring MD) Medicines:            Monitored Anesthesia Care Complications:        No immediate complications. Procedure:            Pre-Anesthesia Assessment:                       - ASA Grade Assessment: II - A patient with mild                        systemic disease.                       - Prior to the procedure, a History and Physical was                        performed, and patient medications, allergies and                        sensitivities were reviewed. The patient's tolerance of                        previous anesthesia was reviewed.                       - The risks and benefits of the procedure and the                        sedation options and risks were discussed with the                        patient. All questions were answered and informed                        consent was obtained.                       - Patient identification and proposed procedure were                        verified prior to the procedure by the physician, the                        nurse, the anesthesiologist, the anesthetist and the                        technician. The procedure was verified in the procedure                        room.                       After obtaining  informed consent, the colonoscope was                        passed under direct vision. Throughout the procedure,                        the patient's blood pressure, pulse, and oxygen                        saturations were monitored continuously. The   Colonoscope was introduced through the anus and                        advanced to the the cecum, identified by appendiceal                        orifice and ileocecal valve. The colonoscopy was                        performed with ease. The patient tolerated the                        procedure well. The quality of the bowel preparation                        was fair. Findings:      The perianal and digital rectal examinations were normal.      Three flat polyps were found in the ascending colon and cecum. The       polyps were 2 to 3 mm in size. These polyps were removed with a cold       biopsy forceps. Resection and retrieval were complete.      Multiple diverticula were found in the sigmoid colon.      The exam was otherwise without abnormality.      The rectum, sigmoid colon, descending colon, transverse colon, ascending       colon and cecum appeared normal.      The retroflexed view of the distal rectum and anal verge was normal and       showed no anal or rectal abnormalities. Impression:           - Preparation of the colon was fair.                       - Three 2 to 3 mm polyps in the ascending colon and in                        the cecum, removed with a cold biopsy forceps. Resected                        and retrieved.                       - Diverticulosis in the sigmoid colon.                       - The examination was otherwise normal.                       - The rectum, sigmoid colon, descending colon,  transverse colon, ascending colon and cecum are normal.                       - The distal rectum and anal verge are normal on                        retroflexion view. Recommendation:       - Discharge patient to home (with escort).                       - High fiber diet.                       - Advance diet as tolerated.                       - Continue present medications.                       - Await pathology results.                        - Repeat colonoscopy in 3 years, due to fair prep on                        today's exam.                       - The findings and recommendations were discussed with                        the patient.                       - The findings and recommendations were discussed with                        the patient's family.                       - Return to primary care physician as previously                        scheduled. Procedure Code(s):    --- Professional ---                       [phone], Colonoscopy, flexible; with biopsy, single or                        multiple Diagnosis Code(s):    --- Professional ---                       D12.2, Benign neoplasm of ascending colon                       D12.0, Benign neoplasm of cecum                       D50.9, Iron deficiency anemia, unspecified                       K57.30, Diverticulosis of large intestine without  perforation or abscess without bleeding CPT copyright 2018 American [employer] Association. All rights reserved. The codes documented in this report are preliminary and upon coder review may  be revised to meet current compliance requirements.  [name], MD Michel Bickers B. Maximino Greenland MD, MD 07/01/2018 9:28:05 AM This report has been signed electronically. Number of Addenda: 0 Note Initiated On: 07/01/2018 7:19 AM Scope Withdrawal Time: 0 hours 18 minutes 31 seconds  Total Procedure Duration: 0 hours 42 minutes 36 seconds  Estimated Blood Loss: Estimated blood loss: none.      [facility]

## 2018-07-05 LAB — SURGICAL PATHOLOGY

## 2018-07-08 ENCOUNTER — Other Ambulatory Visit: Payer: Self-pay | Admitting: Family Medicine

## 2018-07-08 MED ORDER — BUPROPION HCL ER (SR) 150 MG PO TB12: 150.0000 mg | ORAL_TABLET | Freq: Two times a day (BID) | ORAL | 3 refills | Status: DC

## 2018-07-08 NOTE — Telephone Encounter (Signed)
Pt requesting 90 day RX be sent into [facility] on [address]   Pt state she wants to refill before she goes on Medicare.   Pt can't remember what medication needs to be sent in.  Pt is calling back with names of medication.

## 2018-07-08 NOTE — Telephone Encounter (Signed)
Patient states she will call [facility] to find out the Celexa but she does need a refill on her Wellbutrin

## 2018-07-09 ENCOUNTER — Other Ambulatory Visit: Payer: Self-pay

## 2018-07-09 ENCOUNTER — Other Ambulatory Visit: Payer: Self-pay | Admitting: Family Medicine

## 2018-07-09 ENCOUNTER — Telehealth: Payer: Self-pay

## 2018-07-09 MED ORDER — PANTOPRAZOLE SODIUM 20 MG PO TBEC: 20.0000 mg | DELAYED_RELEASE_TABLET | Freq: Every day | ORAL | 0 refills | Status: DC

## 2018-07-09 NOTE — Telephone Encounter (Signed)
-----   Message from Pasty Spillers, MD sent at 07/05/2018 11:20 AM EST ----- Lanny Donoso please let patient know, her biopsies did not show eosinophilic esophagitis.  We can prescribe a trial of Protonix 20 mg 30 minutes before breakfast daily, for 30 days to see if it helps with her dysphagia.  Her colonoscopy showed one precancerous polyp.  Repeat colonoscopy in 3 years as recommended on the colonoscopy report.  Please set recall.  Clinic follow-up in 3 to 4 months to reassess symptoms.

## 2018-07-09 NOTE — Telephone Encounter (Signed)
[facility] faxed refill request for the following medications:  citalopram (CELEXA) 40 MG tablet  Qty: 90  Date written: 06/29/2017  Last fill date:  03/10/2018  Please advise.

## 2018-07-09 NOTE — Telephone Encounter (Signed)
Pt notified. She will try the Protonix and transferred to front desk to make her 3-4 month f/u appt., protonix sent to pharmacy.

## 2018-07-09 NOTE — Telephone Encounter (Signed)
Patient is requesting refill on Benazepril to sent to [facility] [address] KW

## 2018-07-12 MED ORDER — BENAZEPRIL HCL 10 MG PO TABS: 10.0000 mg | ORAL_TABLET | Freq: Every day | ORAL | 3 refills | Status: DC

## 2018-07-12 MED ORDER — CITALOPRAM HYDROBROMIDE 40 MG PO TABS: 40.0000 mg | ORAL_TABLET | Freq: Every day | ORAL | 3 refills | Status: DC

## 2018-07-19 ENCOUNTER — Other Ambulatory Visit: Payer: Self-pay | Admitting: Physical Medicine and Rehabilitation

## 2018-07-19 DIAGNOSIS — M5412 Radiculopathy, cervical region: Secondary | ICD-10-CM

## 2018-07-19 DIAGNOSIS — R200 Anesthesia of skin: Secondary | ICD-10-CM | POA: Diagnosis not present

## 2018-07-19 DIAGNOSIS — M5416 Radiculopathy, lumbar region: Secondary | ICD-10-CM | POA: Diagnosis not present

## 2018-07-19 DIAGNOSIS — M5136 Other intervertebral disc degeneration, lumbar region: Secondary | ICD-10-CM | POA: Diagnosis not present

## 2018-07-19 DIAGNOSIS — M5030 Other cervical disc degeneration, unspecified cervical region: Secondary | ICD-10-CM | POA: Diagnosis not present

## 2018-07-23 ENCOUNTER — Ambulatory Visit
Admission: RE | Admit: 2018-07-23 | Discharge: 2018-07-23 | Disposition: A | Discharge status: Home / Self Care | Payer: Medicare Other | Source: Ambulatory Visit | Attending: Physical Medicine and Rehabilitation | Admitting: Physical Medicine and Rehabilitation

## 2018-07-23 DIAGNOSIS — M47812 Spondylosis without myelopathy or radiculopathy, cervical region: Secondary | ICD-10-CM | POA: Diagnosis not present

## 2018-07-23 DIAGNOSIS — M5412 Radiculopathy, cervical region: Secondary | ICD-10-CM | POA: Insufficient documentation

## 2018-07-26 DIAGNOSIS — R200 Anesthesia of skin: Secondary | ICD-10-CM | POA: Diagnosis not present

## 2018-07-26 DIAGNOSIS — M542 Cervicalgia: Secondary | ICD-10-CM | POA: Diagnosis not present

## 2018-07-28 ENCOUNTER — Other Ambulatory Visit
Admission: RE | Admit: 2018-07-28 | Discharge: 2018-07-28 | Disposition: A | Discharge status: Home / Self Care | Payer: Medicare Other | Attending: Otolaryngology | Admitting: Otolaryngology

## 2018-07-28 DIAGNOSIS — E039 Hypothyroidism, unspecified: Secondary | ICD-10-CM | POA: Diagnosis not present

## 2018-07-28 LAB — T4, FREE: Free T4: 1.03 ng/dL (ref 0.82–1.77)

## 2018-07-28 LAB — TSH: TSH: 0.223 u[IU]/mL — ABNORMAL LOW (ref 0.350–4.500)

## 2018-07-29 LAB — T3, FREE: T3 FREE: 2.4 pg/mL (ref 2.0–4.4)

## 2018-08-06 DIAGNOSIS — K219 Gastro-esophageal reflux disease without esophagitis: Secondary | ICD-10-CM | POA: Diagnosis not present

## 2018-08-06 DIAGNOSIS — E039 Hypothyroidism, unspecified: Secondary | ICD-10-CM | POA: Diagnosis not present

## 2018-08-06 DIAGNOSIS — J3489 Other specified disorders of nose and nasal sinuses: Secondary | ICD-10-CM | POA: Diagnosis not present

## 2018-08-16 DIAGNOSIS — M5136 Other intervertebral disc degeneration, lumbar region: Secondary | ICD-10-CM | POA: Diagnosis not present

## 2018-08-16 DIAGNOSIS — M5416 Radiculopathy, lumbar region: Secondary | ICD-10-CM | POA: Diagnosis not present

## 2018-08-16 DIAGNOSIS — M62830 Muscle spasm of back: Secondary | ICD-10-CM | POA: Diagnosis not present

## 2018-08-16 DIAGNOSIS — M5030 Other cervical disc degeneration, unspecified cervical region: Secondary | ICD-10-CM | POA: Diagnosis not present

## 2018-08-16 DIAGNOSIS — M5412 Radiculopathy, cervical region: Secondary | ICD-10-CM | POA: Diagnosis not present

## 2018-08-18 ENCOUNTER — Other Ambulatory Visit: Payer: Self-pay | Admitting: Obstetrics and Gynecology

## 2018-08-18 DIAGNOSIS — Z1231 Encounter for screening mammogram for malignant neoplasm of breast: Secondary | ICD-10-CM

## 2018-09-07 DIAGNOSIS — M5481 Occipital neuralgia: Secondary | ICD-10-CM | POA: Diagnosis not present

## 2018-09-07 DIAGNOSIS — E559 Vitamin D deficiency, unspecified: Secondary | ICD-10-CM | POA: Diagnosis not present

## 2018-09-07 DIAGNOSIS — G5603 Carpal tunnel syndrome, bilateral upper limbs: Secondary | ICD-10-CM | POA: Diagnosis not present

## 2018-09-07 DIAGNOSIS — G43719 Chronic migraine without aura, intractable, without status migrainosus: Secondary | ICD-10-CM | POA: Diagnosis not present

## 2018-09-10 ENCOUNTER — Other Ambulatory Visit: Payer: Self-pay | Admitting: Neurology

## 2018-09-10 DIAGNOSIS — G43719 Chronic migraine without aura, intractable, without status migrainosus: Secondary | ICD-10-CM

## 2018-09-10 DIAGNOSIS — M5481 Occipital neuralgia: Secondary | ICD-10-CM

## 2018-09-14 ENCOUNTER — Encounter: Payer: Self-pay | Admitting: Gastroenterology

## 2018-09-15 ENCOUNTER — Encounter: Payer: Self-pay | Admitting: Gastroenterology

## 2018-09-15 ENCOUNTER — Ambulatory Visit (INDEPENDENT_AMBULATORY_CARE_PROVIDER_SITE_OTHER): Payer: Medicare Other | Admitting: Gastroenterology

## 2018-09-15 DIAGNOSIS — K219 Gastro-esophageal reflux disease without esophagitis: Secondary | ICD-10-CM | POA: Diagnosis not present

## 2018-09-15 MED ORDER — PANTOPRAZOLE SODIUM 20 MG PO TBEC: 20.0000 mg | DELAYED_RELEASE_TABLET | Freq: Every day | ORAL | 0 refills | Status: DC

## 2018-09-15 NOTE — Progress Notes (Signed)
Vonda Antigua, MD [address]  [place]  [place], [place] [address]  Main: [phone]  Fax: [phone]   Primary Care Physician: Virginia Crews, MD  Virtual Visit via Video Note  I connected with patient on 09/15/18 at  9:30 AM EDT by video (using [email or web address]) and verified that I am speaking with the correct person using two identifiers.   I discussed the limitations, risks, security and privacy concerns of performing an evaluation and management service by video and the availability of in person appointments. I also discussed with the patient that there may be a patient responsible charge related to this service. The patient expressed understanding and agreed to proceed.  Location of Patient: Home Location of Provider: Home Persons involved: Patient and provider only   History of Present Illness: CC: Dysphagia  HPI: LATOIA EYSTER is a 58 y.o. 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  Also has iron deficiency anemia.  No episodes of food impaction.  Patient underwent EGD and colonoscopy.  Colonoscopy showed a fair prep, with 3 subcentimeter polyps removed.  Diverticulosis reported.  Repeat recommended in 3 years due to fair prep.  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  Normal stomach.  Roux-en-Y anatomy with healthy-appearing mucosa.  Biopsies were not consistent with eosinophilic esophagitis.  Colon polyp showed tubular adenoma.  Patient was placed on Protonix once daily which completely resolved her dysphagia, therefore symptoms were likely related to reflux.  Patient has been out of her medication for the last week, and states has noticed some return of symptoms over the last couple days.  The patient denies abdominal or flank pain, anorexia, nausea or vomiting, change in bowel habits or black or bloody stools or weight loss.   Current Outpatient Medications   Medication Sig Dispense Refill  . acetaminophen (TYLENOL) 500 MG tablet Take 1,000 mg by mouth 2 (two) times daily.     . ACETAMINOPHEN-BUTALBITAL 50-325 MG TABS Take 1 tablet by mouth at bedtime as needed (sleep). Reported on 09/03/2015 3 times a week    . albuterol (PROVENTIL HFA;VENTOLIN HFA) 108 (90 Base) MCG/ACT inhaler Inhale 2 puffs into the lungs every 6 (six) hours as needed for wheezing or shortness of breath. 1 Inhaler 3  . aspirin 81 MG tablet Take 81 mg by mouth daily.    . benazepril (LOTENSIN) 10 MG tablet Take 1 tablet (10 mg total) by mouth daily. 90 tablet 3  . bisacodyl (DULCOLAX) 5 MG EC tablet Take 5 mg by mouth daily as needed for moderate constipation.    Marland Kitchen buPROPion (WELLBUTRIN SR) 150 MG 12 hr tablet Take 1 tablet (150 mg total) by mouth 2 (two) times daily. 180 tablet 3  . calcium carbonate (CALCIUM 600) 600 MG TABS tablet Take 1 tablet (600 mg total) 2 (two) times daily with a meal by mouth. 60 tablet   . carisoprodol (SOMA) 350 MG tablet Take 1 tablet (350 mg total) by mouth at bedtime. 90 tablet 1  . cetirizine (ZYRTEC) 10 MG tablet Take 10 mg by mouth daily.    . Cholecalciferol (VITAMIN D3) 1000 UNITS tablet Take 2,000 Units by mouth daily.     . citalopram (CELEXA) 40 MG tablet Take 1 tablet (40 mg total) by mouth daily. 90 tablet 3  . cyclobenzaprine (FLEXERIL) 10 MG tablet cyclobenzaprine 10 mg tablet    . EPINEPHrine (EPI-PEN) 0.3 mg/0.3 mL DEVI Inject 0.3 mLs (  0.3 mg total) into the muscle once. (Patient taking differently: Inject 0.3 mg into the muscle once. ) 1 Device 2  . estradiol (ESTRACE) 2 MG tablet Take 1 tablet by mouth daily.   0  . Fluticasone-Salmeterol (ADVAIR DISKUS) 100-50 MCG/DOSE AEPB Inhale 1 puff into the lungs 2 (two) times daily. 60 each 11  . furosemide (LASIX) 20 MG tablet Take 1 tablet (20 mg total) by mouth daily. 90 tablet 2  . levothyroxine (SYNTHROID, LEVOTHROID) 150 MCG tablet Take 175 mcg by mouth daily before breakfast.    .  liothyronine (CYTOMEL) 5 MCG tablet liothyronine 5 mcg tablet    . metoprolol tartrate (LOPRESSOR) 25 MG tablet TAKE 1 TABLET BY MOUTH TWICE DAILY 180 tablet 3  . metoprolol tartrate (LOPRESSOR) 25 MG tablet Take 1 tablet (25 mg total) by mouth 2 (two) times daily. 180 tablet 3  . montelukast (SINGULAIR) 10 MG tablet Take 10 mg by mouth every morning.     . ondansetron (ZOFRAN) 4 MG tablet Take 1 tablet (4 mg total) by mouth every 8 (eight) hours as needed for nausea or vomiting. 30 tablet 0  . pantoprazole (PROTONIX) 20 MG tablet Take 1 tablet (20 mg total) by mouth daily. Take 30 min. Before breakfast 90 tablet 0  . SUMAtriptan (IMITREX) 50 MG tablet Take 50 mg by mouth every 2 (two) hours as needed for migraine. May repeat in 2 hours if headache persists or recurs.    . traZODone (DESYREL) 50 MG tablet TAKE 1/2 TO 1 (ONE-HALF TO ONE) TABLET BY MOUTH AT BEDTIME AS NEEDED FOR SLEEP 30 tablet 5  . Turmeric 500 MG CAPS Take 1 capsule by mouth 2 (two) times daily.    . vitamin B-12 (CYANOCOBALAMIN) 500 MCG tablet Take 500 mcg by mouth daily.    Marland Kitchen Zoster Vaccine Adjuvanted [facility]) injection Shingrix (PF) 50 mcg/0.5 mL intramuscular suspension, kit     No current facility-administered medications for this visit.     Allergies as of 09/15/2018 - Review Complete 09/15/2018  Allergen Reaction Noted  . Sulfa antibiotics Shortness Of Breath 12/19/2010  . Sulfur Anaphylaxis 07/01/2018  . Cefuroxime axetil Other (See Comments) 09/03/2015  . Nutritional supplements Swelling 12/19/2010  . Other  03/19/2017  . Oxycodone-acetaminophen Nausea And Vomiting 09/03/2015  . Betadine [povidone iodine] Rash 03/08/2016    Review of Systems:    All systems reviewed and negative except where noted in HPI.   Observations/Objective:  Labs: CMP     Component Value Date/Time   NA 138 06/01/2018 1511   NA 139 05/06/2018 0909   NA 132 (L) 08/27/2013 0616   K 3.9 06/01/2018 1511   K 4.5 08/27/2013 0616    CL 106 06/01/2018 1511   CL 102 08/27/2013 0616   CO2 26 06/01/2018 1511   CO2 27 08/27/2013 0616   GLUCOSE 93 06/01/2018 1511   GLUCOSE 107 (H) 08/27/2013 0616   BUN 14 06/01/2018 1511   BUN 13 05/06/2018 0909   BUN 12 08/27/2013 0616   CREATININE 0.99 06/01/2018 1511   CREATININE 0.91 08/27/2013 0616   CREATININE 1.02 09/20/2011   CALCIUM 8.5 (L) 06/01/2018 1511   CALCIUM 7.4 (L) 08/27/2013 0616   CALCIUM 7.7 09/20/2011   PROT 6.7 06/01/2018 1511   PROT 5.9 (L) 02/03/2018 1033   PROT 6.4 09/20/2011 1746   ALBUMIN 3.8 06/01/2018 1511   ALBUMIN 3.6 05/06/2018 0909   ALBUMIN 3.5 09/20/2011 1746   AST 21 06/01/2018 1511   AST 41 (  H) 09/20/2011 1746   AST 41 09/20/2011   ALT 19 06/01/2018 1511   ALT 68 09/20/2011 1746   ALKPHOS 71 06/01/2018 1511   ALKPHOS 127 09/20/2011 1746   ALKPHOS 127 09/20/2011   BILITOT 0.5 06/01/2018 1511   BILITOT 0.3 02/03/2018 1033   BILITOT 0.4 09/20/2011 1746   BILITOT 0.4 09/20/2011   GFRNONAA >60 06/01/2018 1511   GFRNONAA >60 08/27/2013 0616   GFRAA >60 06/01/2018 1511   GFRAA >60 08/27/2013 0616   Lab Results  Component Value Date   WBC 4.6 06/01/2018   HGB 12.6 06/01/2018   HCT 38.6 06/01/2018   MCV 88.3 06/01/2018   PLT 188 06/01/2018    Imaging Studies: No results found.  Assessment and Plan:   ANUSHREE DORSI is a 58 y.o. y/o female with initial complaints of dysphagia not completely resolved on once daily PPI  Assessment and Plan: Due to patient's complete resolution of dysphagia on PPI and now return of symptoms since she has been out of her medication, we discussed risks and benefits of continuing the medication patient would like to have the medication refilled due to return of her symptoms and understands the risk of continuing the medication at this time. (Risks of PPI use were discussed with patient including bone loss, C. Diff diarrhea, pneumonia, infections, CKD, electrolyte abnormalities.  If clinically possible based  on symptoms, goal would be to maintain patient on the lowest dose possible, or discontinue the medication with institution of acid reflux lifestyle modifications over time. Pt. Verbalizes understanding and chooses to continue the medication.)  Patient educated extensively on acid reflux lifestyle modification, including buying a bed wedge, not eating 3 hrs before bedtime, diet modifications, and handout given for the same.    Follow Up Instructions: Follow-up clinic visit in 3 to 6 months   I discussed the assessment and treatment plan with the patient. The patient was provided an opportunity to ask questions and all were answered. The patient agreed with the plan and demonstrated an understanding of the instructions.   The patient was advised to call back or seek an in-person evaluation if the symptoms worsen or if the condition fails to improve as anticipated.  I provided 10 minutes of non-face-to-face time during this encounter.   Virgel Manifold, MD  Speech recognition software was used to dictate this note.

## 2018-09-29 ENCOUNTER — Ambulatory Visit: Payer: BLUE CROSS/BLUE SHIELD | Admitting: Gastroenterology

## 2018-10-08 ENCOUNTER — Other Ambulatory Visit: Payer: Self-pay

## 2018-10-08 ENCOUNTER — Ambulatory Visit
Admission: RE | Admit: 2018-10-08 | Discharge: 2018-10-08 | Disposition: A | Discharge status: Home / Self Care | Payer: Medicare Other | Source: Ambulatory Visit | Attending: Neurology | Admitting: Neurology

## 2018-10-08 DIAGNOSIS — G43719 Chronic migraine without aura, intractable, without status migrainosus: Secondary | ICD-10-CM | POA: Insufficient documentation

## 2018-10-08 DIAGNOSIS — M5481 Occipital neuralgia: Secondary | ICD-10-CM | POA: Diagnosis not present

## 2018-10-08 DIAGNOSIS — G43809 Other migraine, not intractable, without status migrainosus: Secondary | ICD-10-CM | POA: Diagnosis not present

## 2018-10-08 LAB — POCT I-STAT CREATININE: Creatinine, Ser: 0.9 mg/dL (ref 0.44–1.00)

## 2018-10-08 MED ORDER — GADOBUTROL 1 MMOL/ML IV SOLN
10.0000 mL | Freq: Once | INTRAVENOUS | Status: AC | PRN
  Administered 2018-10-08: 14:00:00 10 mL via INTRAVENOUS

## 2018-10-18 ENCOUNTER — Telehealth: Payer: Self-pay | Admitting: Gastroenterology

## 2018-10-18 NOTE — Telephone Encounter (Signed)
Pt states she would like a 3 month f/u. Currently she is having some issues with swallowing and I informed her we could schedule her for a telemed (video) visit sooner and she stated 3 months would be fine. Encouraged to contact office if appt is needed sooner.

## 2018-10-18 NOTE — Telephone Encounter (Signed)
Patient called & states she had a message in my chart to call & make an appointment. She is in recalls for an October appt. The note from 09-15-2018 states come in 3-6 months. Does she need a sooner appointment?

## 2018-10-26 DIAGNOSIS — L570 Actinic keratosis: Secondary | ICD-10-CM | POA: Diagnosis not present

## 2018-10-26 DIAGNOSIS — D485 Neoplasm of uncertain behavior of skin: Secondary | ICD-10-CM | POA: Diagnosis not present

## 2018-10-26 DIAGNOSIS — Z86018 Personal history of other benign neoplasm: Secondary | ICD-10-CM | POA: Diagnosis not present

## 2018-10-26 DIAGNOSIS — C44319 Basal cell carcinoma of skin of other parts of face: Secondary | ICD-10-CM | POA: Diagnosis not present

## 2018-10-26 DIAGNOSIS — Z872 Personal history of diseases of the skin and subcutaneous tissue: Secondary | ICD-10-CM | POA: Diagnosis not present

## 2018-10-26 DIAGNOSIS — Z859 Personal history of malignant neoplasm, unspecified: Secondary | ICD-10-CM | POA: Diagnosis not present

## 2018-10-26 DIAGNOSIS — Z85828 Personal history of other malignant neoplasm of skin: Secondary | ICD-10-CM | POA: Diagnosis not present

## 2018-10-26 DIAGNOSIS — L578 Other skin changes due to chronic exposure to nonionizing radiation: Secondary | ICD-10-CM | POA: Diagnosis not present

## 2018-11-04 ENCOUNTER — Other Ambulatory Visit
Admission: RE | Admit: 2018-11-04 | Discharge: 2018-11-04 | Disposition: A | Discharge status: Home / Self Care | Payer: Medicare Other | Attending: Otolaryngology | Admitting: Otolaryngology

## 2018-11-04 DIAGNOSIS — E039 Hypothyroidism, unspecified: Secondary | ICD-10-CM | POA: Diagnosis not present

## 2018-11-04 LAB — T4, FREE: Free T4: 1.04 ng/dL (ref 0.82–1.77)

## 2018-11-04 LAB — TSH: TSH: 0.615 u[IU]/mL (ref 0.350–4.500)

## 2018-11-05 ENCOUNTER — Encounter: Payer: BLUE CROSS/BLUE SHIELD | Admitting: Family Medicine

## 2018-11-06 LAB — T3, FREE: T3, Free: 2.3 pg/mL (ref 2.0–4.4)

## 2018-11-09 DIAGNOSIS — J3489 Other specified disorders of nose and nasal sinuses: Secondary | ICD-10-CM | POA: Diagnosis not present

## 2018-11-09 DIAGNOSIS — E890 Postprocedural hypothyroidism: Secondary | ICD-10-CM | POA: Diagnosis not present

## 2018-11-23 DIAGNOSIS — C44319 Basal cell carcinoma of skin of other parts of face: Secondary | ICD-10-CM | POA: Diagnosis not present

## 2018-11-23 DIAGNOSIS — C4491 Basal cell carcinoma of skin, unspecified: Secondary | ICD-10-CM | POA: Diagnosis not present

## 2018-11-23 DIAGNOSIS — D2239 Melanocytic nevi of other parts of face: Secondary | ICD-10-CM | POA: Diagnosis not present

## 2018-12-06 ENCOUNTER — Other Ambulatory Visit: Payer: Self-pay

## 2018-12-06 ENCOUNTER — Inpatient Hospital Stay: Payer: Medicare Other | Attending: Oncology

## 2018-12-06 DIAGNOSIS — Z9884 Bariatric surgery status: Secondary | ICD-10-CM | POA: Diagnosis not present

## 2018-12-06 DIAGNOSIS — D6862 Lupus anticoagulant syndrome: Secondary | ICD-10-CM | POA: Insufficient documentation

## 2018-12-06 DIAGNOSIS — Z87891 Personal history of nicotine dependence: Secondary | ICD-10-CM | POA: Diagnosis not present

## 2018-12-06 DIAGNOSIS — Z86718 Personal history of other venous thrombosis and embolism: Secondary | ICD-10-CM | POA: Diagnosis not present

## 2018-12-06 DIAGNOSIS — D508 Other iron deficiency anemias: Secondary | ICD-10-CM | POA: Diagnosis not present

## 2018-12-06 LAB — FERRITIN: Ferritin: 96 ng/mL (ref 11–307)

## 2018-12-06 LAB — CBC WITH DIFFERENTIAL/PLATELET
Abs Immature Granulocytes: 0.01 10*3/uL (ref 0.00–0.07)
Basophils Absolute: 0 10*3/uL (ref 0.0–0.1)
Basophils Relative: 1 %
Eosinophils Absolute: 0.1 10*3/uL (ref 0.0–0.5)
Eosinophils Relative: 1 %
HCT: 40.1 % (ref 36.0–46.0)
Hemoglobin: 13.8 g/dL (ref 12.0–15.0)
Immature Granulocytes: 0 %
Lymphocytes Relative: 32 %
Lymphs Abs: 1.7 10*3/uL (ref 0.7–4.0)
MCH: 31.9 pg (ref 26.0–34.0)
MCHC: 34.4 g/dL (ref 30.0–36.0)
MCV: 92.6 fL (ref 80.0–100.0)
Monocytes Absolute: 0.3 10*3/uL (ref 0.1–1.0)
Monocytes Relative: 6 %
Neutro Abs: 3.2 10*3/uL (ref 1.7–7.7)
Neutrophils Relative %: 60 %
Platelets: 194 10*3/uL (ref 150–400)
RBC: 4.33 MIL/uL (ref 3.87–5.11)
RDW: 11.2 % — ABNORMAL LOW (ref 11.5–15.5)
WBC: 5.4 10*3/uL (ref 4.0–10.5)
nRBC: 0 % (ref 0.0–0.2)

## 2018-12-06 LAB — IRON AND TIBC
Iron: 100 ug/dL (ref 28–170)
Saturation Ratios: 30 % (ref 10.4–31.8)
TIBC: 329 ug/dL (ref 250–450)
UIBC: 229 ug/dL

## 2018-12-06 LAB — FOLATE: Folate: 32 ng/mL (ref >5.9)

## 2018-12-06 LAB — VITAMIN B12: Vitamin B-12: 1029 pg/mL — ABNORMAL HIGH (ref 180–914)

## 2018-12-08 ENCOUNTER — Inpatient Hospital Stay: Payer: Medicare Other | Admitting: Oncology

## 2018-12-08 ENCOUNTER — Inpatient Hospital Stay: Payer: Medicare Other

## 2018-12-08 ENCOUNTER — Encounter: Payer: Self-pay | Admitting: Oncology

## 2018-12-08 ENCOUNTER — Other Ambulatory Visit: Payer: Self-pay

## 2018-12-08 VITALS — BP 110/78 | HR 61 | Temp 97.5°F | Wt 235.3 lb

## 2018-12-08 DIAGNOSIS — D508 Other iron deficiency anemias: Secondary | ICD-10-CM | POA: Diagnosis not present

## 2018-12-08 DIAGNOSIS — Z86718 Personal history of other venous thrombosis and embolism: Secondary | ICD-10-CM

## 2018-12-08 DIAGNOSIS — Z87891 Personal history of nicotine dependence: Secondary | ICD-10-CM

## 2018-12-08 DIAGNOSIS — Z9884 Bariatric surgery status: Secondary | ICD-10-CM

## 2018-12-08 DIAGNOSIS — D6862 Lupus anticoagulant syndrome: Secondary | ICD-10-CM

## 2018-12-08 NOTE — Progress Notes (Signed)
Patient here today for follow up.  Patient states no new concerns today  

## 2018-12-12 NOTE — Progress Notes (Signed)
Hematology/Oncology Consult note [facility] Telephone:([phone] [phone]   Patient Care Team: Virginia Crews, MD as PCP - General (Family Medicine) Samara Deist, DPM as Referring Physician (Podiatry) Samara Deist, DPM as Referring Physician (Podiatry) Schnier, Dolores Lory, MD (Vascular Surgery)  REFERRING PROVIDER: Dr.Tahiliani CHIEF COMPLAINTS/REASON FOR VISIT:  Evaluation of iron deficiency anemia  HISTORY OF PRESENTING ILLNESS:  Claire Strong is a  58 y.o.  female with PMH listed below who was referred to me for evaluation of iron deficiency anemia Patient was recently referred to GI for evaluation of 3 months history of dysphagia with solid food.  Her previous labs showed low normal ferritin level at 15.Marland Kitchen Referred to me for further evaluation of iron deficiency.  She has a history of gastric bypass in 2006.  Previous colonoscopy showed hyperplastic polyps.   Associated signs and symptoms: Patient reports fatigue.  Denies SOB with exertion.  Denies weight loss, easy bruising, hematochezia, hemoptysis, hematuria. Context: History of gastric bypass History of iron deficiency: Yes, reports history of IV iron infusion.  Rectal bleeding: deneis Menstrual bleeding/ Vaginal bleeding : hysterotomy at age of 39  Hematemesis or hemoptysis : denies Last endoscopy:2006 Fatigue: Yes.  SOB: deneis   # Also history of DVT, after 1st pregnancy, history of positive lupus anticoagulant, per patient, repeat antibody panel was negative. Currently not on anticoagulation, on Aspirin.   History of basal cell carcinoma.   INTERVAL HISTORY Claire Strong is a 58 y.o. female who has above history reviewed by me today presents for follow up visit for management of iron deficiency anemia,  Problems and complaints are listed below: Patient was last seen by me on 06/01/2018, she received IV Feraheme 510 mg weekly x2. During the interval patient has had  EGD and colonoscopy on 07/01/2018 EGD shows reflux esophagitis.  No atypia or malignancy. Colonoscopy showed tubular adenoma.  Negative for high-grade dysplasia or malignancy. Today she reports feeling pretty well. No new complaints.  Review of Systems  Constitutional: Negative for appetite change, chills, fatigue and fever.  HENT:   Negative for hearing loss and voice change.   Eyes: Negative for eye problems.  Respiratory: Negative for chest tightness and cough.   Cardiovascular: Negative for chest pain.  Gastrointestinal: Negative for abdominal distention, abdominal pain and blood in stool.  Endocrine: Negative for hot flashes.  Genitourinary: Negative for difficulty urinating and frequency.   Musculoskeletal: Negative for arthralgias.  Skin: Negative for itching and rash.  Neurological: Negative for extremity weakness.  Hematological: Negative for adenopathy. Does not bruise/bleed easily.  Psychiatric/Behavioral: Negative for confusion.    MEDICAL HISTORY:  Past Medical History:  Diagnosis Date  . Anxiety   . Basal cell carcinoma    nose  . Bleeding disorder ([place])   . Bronchial asthma   . Cancer ([place])    melanoma; right knee  . 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 Gigi Gin ortho)  . Chronic venous insufficiency 2007   s/p vein stripping and laser ablation  . Depression   . Diverticulosis    mild by colonoscopy  . History of DVT (deep vein thrombosis)    DVTs after 1st pregnancy, not on AC 2/2 bleeding ulcer, greenfield filter in place  . History of gastric ulcer 2009  . History of kidney stones   . Horseshoe kidney    sole, R kidney damage  . HTN (hypertension)   . Hypothyroidism   . Kidney stones  s/p renal hematoma after lithotripsy on right  . Lupus anticoagulant disorder ([place])   . Multinodular goiter    s/p thyroidectomy  . Nasal septal perforation    chronic, ENT rec avoid antihistamine, INS  . Osteoarthritis    ?FM by  rheum  . Overweight   . Post-surgical hypothyroidism    for multinodular goiter  . 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 Frutoso Chase  . Seasonal allergies    allergy shots, singulair  . 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 Tachycardia     SURGICAL HISTORY: Past Surgical History:  Procedure Laterality Date  . ABDOMINAL HERNIA REPAIR    . ABDOMINAL HYSTERECTOMY    . ANKLE FUSION Left 05/2013  . APPENDECTOMY  1978  . BACK SURGERY  2009  . CESAREAN SECTION     X 2  . CHOLECYSTECTOMY  1998  . COLONOSCOPY  05/2012   hyperplastic polyps x2, mild diverticulosis Deatra Ina) rpt 10 yrs  . COLONOSCOPY WITH PROPOFOL N/A 07/01/2018   Procedure: COLONOSCOPY WITH PROPOFOL;  Surgeon: Virgel Manifold, MD;  Location: [facility] ENDOSCOPY;  Service: Endoscopy;  Laterality: N/A;  . CYST EXCISION     back  and shoulder  . ESI Right 10/2015, 02/2016   C4/5 then C5/6 ESI (Chasnis)  . ESI Bilateral 06/2015, 08/2015, 03/2016, 09/2017   L4/5 transforaminal ESI (Chasnis)  . ESOPHAGOGASTRODUODENOSCOPY (EGD) WITH PROPOFOL N/A 07/01/2018   Procedure: ESOPHAGOGASTRODUODENOSCOPY (EGD) WITH PROPOFOL;  Surgeon: Virgel Manifold, MD;  Location: [facility] ENDOSCOPY;  Service: Endoscopy;  Laterality: N/A;  . FL HIP INJECTION ([place] HX) Left 11/2016   Dr Sharlet Salina  . FUSION OF TALONAVICULAR JOINT Left 09/12/2016   Procedure: FUSION OF TALONAVICULAR JOINT WITH TRINITY BONE GRAFT;  Surgeon: Samara Deist, DPM;  Location: [facility] ORS;  Service: Podiatry;  Laterality: Left;  Marland Kitchen GASTRIC BYPASS  05/21/05   Dr. Frutoso Chase, Roux-en-Y  . Greenfield filter removal  2015   removed 6 wks after surgery  . hammerhead toes Left   . HERNIA REPAIR     X 3  . IVC FILTER INSERTION N/A 09/09/2016   Procedure: IVC Filter Insertion;  Surgeon: Katha Cabal, MD;  Location: [place] [facility];  Service: Cardiovascular;  Laterality: N/A;  . IVC FILTER REMOVAL N/A 11/25/2016   Procedure: IVC Filter Removal;  Surgeon: Katha Cabal, MD;  Location:  [place] [facility];  Service: Cardiovascular;  Laterality: N/A;  . JOINT REPLACEMENT    . lyphoma excision  08/2015   arm  . NASAL SEPTUM SURGERY  1981   deviated septum, [facility]  . PARTIAL HYSTERECTOMY  1989   ovaries remain  . SKIN SURGERY     basel cell  . TONSILLECTOMY  1969  . TOTAL HIP ARTHROPLASTY Right 11/06/2015   Procedure: TOTAL HIP ARTHROPLASTY ANTERIOR APPROACH;  Surgeon: Hessie Knows, MD;  Location: [facility] ORS;  Service: Orthopedics;  Laterality: Right;  . TOTAL HIP ARTHROPLASTY Left 03/19/2017   Procedure: TOTAL HIP ARTHROPLASTY ANTERIOR APPROACH;  Surgeon: Hessie Knows, MD;  Location: [facility] ORS;  Service: Orthopedics;  Laterality: Left;  . TOTAL KNEE ARTHROPLASTY Left 08/2013  . TOTAL THYROIDECTOMY  09/2011   benign path (done for multinodular goiter concern for cancer)    SOCIAL HISTORY: Social History   Socioeconomic History  . Marital status: Married    Spouse name: Gwyndolyn Saxon  . Number of children: 2  . Years of education: 60  . Highest education level: High school graduate  Occupational  History  . Occupation: stay at home  Social Needs  . Financial resource strain: Not on file  . Food insecurity    Worry: Not on file    Inability: Not on file  . Transportation needs    Medical: Not on file    Non-medical: Not on file  Tobacco Use  . Smoking status: Former Smoker    Packs/day: 0.25    Types: Cigarettes    Quit date: 06/16/2004    Years since quitting: 14.4  . Smokeless tobacco: Never Used  . Tobacco comment: Socially-no longer  Substance and Sexual Activity  . Alcohol use: Yes    Alcohol/week: 1.0 standard drinks    Types: 1 Cans of beer per week    Comment: Occasionally-wine and beer  . Drug use: No  . Sexual activity: Yes    Partners: Male    Birth control/protection: Surgical  Lifestyle  . Physical activity    Days per week: Not on file    Minutes per session: Not on file  . Stress: Not on file  Relationships  . Social Product manager on phone: Not on file    Gets together: Not on file    Attends religious service: Not on file    Active member of club or organization: Not on file    Attends meetings of clubs or organizations: Not on file    Relationship status: Not on file  . Intimate partner violence    Fear of current or ex partner: Not on file    Emotionally abused: Not on file    Physically abused: Not on file    Forced sexual activity: Not on file  Other Topics Concern  . Not on file  Social History Narrative  . Not on file    FAMILY HISTORY: Family History  Problem Relation Age of Onset  . Diabetes Mother        prediabetes  . Alzheimer's disease Mother   . Hyperlipidemia Mother   . Diabetes Father   . Coronary artery disease Father 76       CABG  . Alcohol abuse Father   . Lung cancer Father   . Brain cancer Maternal Uncle   . Diabetes Paternal Grandmother   . Lung cancer Brother   . Alzheimer's disease Brother   . Diabetes Maternal Grandmother   . Stroke Neg Hx   . Colon cancer Neg Hx   . Stomach cancer Neg Hx   . Kidney disease Neg Hx   . Breast cancer Neg Hx   . Kidney cancer Neg Hx   . Bladder Cancer Neg Hx   . Ovarian cancer Neg Hx     ALLERGIES:  is allergic to sulfa antibiotics; sulfur; cefuroxime axetil; nutritional supplements; other; oxycodone-acetaminophen; and betadine [povidone iodine].  MEDICATIONS:  Current Outpatient Medications  Medication Sig Dispense Refill  . acetaminophen (TYLENOL) 500 MG tablet Take 1,000 mg by mouth 2 (two) times daily.     . ACETAMINOPHEN-BUTALBITAL 50-325 MG TABS Take 1 tablet by mouth at bedtime as needed (sleep). Reported on 09/03/2015 3 times a week    . albuterol (PROVENTIL HFA;VENTOLIN HFA) 108 (90 Base) MCG/ACT inhaler Inhale 2 puffs into the lungs every 6 (six) hours as needed for wheezing or shortness of breath. 1 Inhaler 3  . aspirin 81 MG tablet Take 81 mg by mouth daily.    . benazepril (LOTENSIN) 10 MG tablet Take 1 tablet (10  mg total) by mouth daily.  90 tablet 3  . bisacodyl (DULCOLAX) 5 MG EC tablet Take 5 mg by mouth daily as needed for moderate constipation.    Marland Kitchen buPROPion (WELLBUTRIN SR) 150 MG 12 hr tablet Take 1 tablet (150 mg total) by mouth 2 (two) times daily. 180 tablet 3  . calcium carbonate (CALCIUM 600) 600 MG TABS tablet Take 1 tablet (600 mg total) 2 (two) times daily with a meal by mouth. 60 tablet   . carisoprodol (SOMA) 350 MG tablet Take 1 tablet (350 mg total) by mouth at bedtime. 90 tablet 1  . cetirizine (ZYRTEC) 10 MG tablet Take 10 mg by mouth daily.    . Cholecalciferol (VITAMIN D3) 1000 UNITS tablet Take 2,000 Units by mouth daily.     . citalopram (CELEXA) 40 MG tablet Take 1 tablet (40 mg total) by mouth daily. 90 tablet 3  . cyclobenzaprine (FLEXERIL) 10 MG tablet cyclobenzaprine 10 mg tablet    . enoxaparin (LOVENOX) 150 MG/ML injection Inject 150 mg into the skin every other day.    . Enoxaparin Sodium (LOVENOX IJ) Inject 175 mg as directed every other day.    Marland Kitchen EPINEPHrine (EPI-PEN) 0.3 mg/0.3 mL DEVI Inject 0.3 mLs (0.3 mg total) into the muscle once. (Patient taking differently: Inject 0.3 mg into the muscle once. ) 1 Device 2  . estradiol (ESTRACE) 2 MG tablet Take 1 tablet by mouth daily.   0  . Fluticasone-Salmeterol (ADVAIR DISKUS) 100-50 MCG/DOSE AEPB Inhale 1 puff into the lungs 2 (two) times daily. 60 each 11  . furosemide (LASIX) 20 MG tablet Take 1 tablet (20 mg total) by mouth daily. 90 tablet 2  . levothyroxine (SYNTHROID, LEVOTHROID) 150 MCG tablet Take 175 mcg by mouth daily before breakfast.    . liothyronine (CYTOMEL) 5 MCG tablet liothyronine 5 mcg tablet    . metoprolol tartrate (LOPRESSOR) 25 MG tablet Take 1 tablet (25 mg total) by mouth 2 (two) times daily. 180 tablet 3  . montelukast (SINGULAIR) 10 MG tablet Take 10 mg by mouth every morning.     . ondansetron (ZOFRAN) 4 MG tablet Take 1 tablet (4 mg total) by mouth every 8 (eight) hours as needed for nausea or  vomiting. 30 tablet 0  . pantoprazole (PROTONIX) 20 MG tablet Take 1 tablet (20 mg total) by mouth daily. Take 30 min. Before breakfast 90 tablet 0  . SUMAtriptan (IMITREX) 50 MG tablet Take 50 mg by mouth every 2 (two) hours as needed for migraine. May repeat in 2 hours if headache persists or recurs.    . traZODone (DESYREL) 50 MG tablet TAKE 1/2 TO 1 (ONE-HALF TO ONE) TABLET BY MOUTH AT BEDTIME AS NEEDED FOR SLEEP 30 tablet 5  . Turmeric 500 MG CAPS Take 1 capsule by mouth 2 (two) times daily.    . vitamin B-12 (CYANOCOBALAMIN) 500 MCG tablet Take 500 mcg by mouth daily.    Marland Kitchen Zoster Vaccine Adjuvanted [facility]) injection Shingrix (PF) 50 mcg/0.5 mL intramuscular suspension, kit     No current facility-administered medications for this visit.      PHYSICAL EXAMINATION: ECOG PERFORMANCE STATUS: 1 - Symptomatic but completely ambulatory Vitals:   12/08/18 1330  BP: 110/78  Pulse: 61  Temp: (!) 97.5 F (36.4 C)   Filed Weights   12/08/18 1330  Weight: 235 lb 5 oz (106.7 kg)    Physical Exam Constitutional:      General: She is not in acute distress. HENT:     Head: Normocephalic and  atraumatic.  Eyes:     General: No scleral icterus.    Pupils: Pupils are equal, round, and reactive to light.  Neck:     Musculoskeletal: Normal range of motion and neck supple.  Cardiovascular:     Rate and Rhythm: Normal rate and regular rhythm.     Heart sounds: Normal heart sounds.  Pulmonary:     Effort: Pulmonary effort is normal. No respiratory distress.     Breath sounds: No wheezing.  Abdominal:     General: Bowel sounds are normal. There is no distension.     Palpations: Abdomen is soft. There is no mass.     Tenderness: There is no abdominal tenderness.  Musculoskeletal: Normal range of motion.        General: No deformity.  Skin:    General: Skin is warm and dry.     Findings: No erythema or rash.  Neurological:     Mental Status: She is alert and oriented to person, place,  and time.     Cranial Nerves: No cranial nerve deficit.     Coordination: Coordination normal.  Psychiatric:        Behavior: Behavior normal.        Thought Content: Thought content normal.      LABORATORY DATA:  I have reviewed the data as listed Lab Results  Component Value Date   WBC 5.4 12/06/2018   HGB 13.8 12/06/2018   HCT 40.1 12/06/2018   MCV 92.6 12/06/2018   PLT 194 12/06/2018   Recent Labs    02/03/18 1033 05/06/18 0909 06/01/18 1511 10/08/18 1255  NA 140 139 138  --   K 4.9 4.8 3.9  --   CL 101 102 106  --   CO2 _0 --   GLUCOSE 82 85 93  --   BUN _1 --   CREATININE 0.88 0.96 0.99 0.90  CALCIUM 8.2* 8.3* 8.5*  --   GFRNONAA 74 66 >60  --   GFRAA 85 76 >60  --   PROT 5.9*  --  6.7  --   ALBUMIN 3.8 3.6 3.8  --   AST 22  --  21  --   ALT 20  --  19  --   ALKPHOS 102  --  71  --   BILITOT 0.3  --  0.5  --    Iron/TIBC/Ferritin/ %Sat    Component Value Date/Time   IRON 100 12/06/2018 1135   IRON 54 02/03/2018 1033   TIBC 329 12/06/2018 1135   TIBC 358 02/03/2018 1033   FERRITIN 96 12/06/2018 1135   FERRITIN 16 02/03/2018 1033   IRONPCTSAT 30 12/06/2018 1135   IRONPCTSAT 15 02/03/2018 1033      ASSESSMENT & PLAN:  1. Other iron deficiency anemia   2. History of gastric bypass   Labs reviewed and discussed with patient. Hemoglobin is stable.  Iron panel shows ferritin of 96, TIBC 329, saturation 30. No need for IV Feraheme today.  Continue follow-up with gastroenterology for esophagitis.  Management.  Continue Protonix.  Orders Placed This Encounter  Procedures  . CBC with Differential/Platelet    Standing Status:   Future    Standing Expiration Date:   12/08/2019  . Ferritin    Standing Status:   Future    Standing Expiration Date:   12/08/2019  . Iron and TIBC    Standing Status:   Future    Standing Expiration Date:  12/08/2019    All questions were answered. The patient knows to call the clinic with any problems  questions or concerns.  Return of visit:6 months.  We spent sufficient time to discuss many aspect of care, questions were answered to patient's satisfaction. Total face to face encounter time for this patient visit was 15 min. >50% of the time was  spent in counseling and coordination of care.    Earlie Server, MD, PhD Hematology Oncology [facility] Pager- [phone] 12/12/2018

## 2018-12-14 ENCOUNTER — Ambulatory Visit: Payer: Medicare Other

## 2018-12-19 ENCOUNTER — Other Ambulatory Visit: Payer: Self-pay | Admitting: Gastroenterology

## 2018-12-23 ENCOUNTER — Other Ambulatory Visit: Payer: Self-pay

## 2018-12-23 ENCOUNTER — Ambulatory Visit
Admission: RE | Admit: 2018-12-23 | Discharge: 2018-12-23 | Disposition: A | Discharge status: Home / Self Care | Payer: Medicare Other | Source: Ambulatory Visit | Attending: Obstetrics and Gynecology | Admitting: Obstetrics and Gynecology

## 2018-12-23 DIAGNOSIS — Z1231 Encounter for screening mammogram for malignant neoplasm of breast: Secondary | ICD-10-CM | POA: Diagnosis not present

## 2018-12-29 DIAGNOSIS — H26051 Posterior subcapsular polar infantile and juvenile cataract, right eye: Secondary | ICD-10-CM | POA: Diagnosis not present

## 2019-01-09 ENCOUNTER — Other Ambulatory Visit: Payer: Self-pay | Admitting: Family Medicine

## 2019-01-13 DIAGNOSIS — M2011 Hallux valgus (acquired), right foot: Secondary | ICD-10-CM | POA: Diagnosis not present

## 2019-01-13 DIAGNOSIS — M79671 Pain in right foot: Secondary | ICD-10-CM | POA: Diagnosis not present

## 2019-01-13 DIAGNOSIS — D2372 Other benign neoplasm of skin of left lower limb, including hip: Secondary | ICD-10-CM | POA: Diagnosis not present

## 2019-01-13 DIAGNOSIS — M79672 Pain in left foot: Secondary | ICD-10-CM | POA: Diagnosis not present

## 2019-01-16 ENCOUNTER — Encounter: Payer: Self-pay | Admitting: Family Medicine

## 2019-01-17 ENCOUNTER — Encounter: Payer: Self-pay | Admitting: Family Medicine

## 2019-01-17 NOTE — Progress Notes (Deleted)
Patient: Claire Strong, Female    DOB: 1960/07/01, 58 y.o.   MRN: [id number] Visit Date: 01/17/2019  Today's Provider: Lavon Paganini, MD   No chief complaint on file.  Subjective:     First annual wellness visit Claire Strong is a 58 y.o. female who presents today for her First Annual Wellness Visit. She feels {DESC; WELL/FAIRLY WELL/POORLY:18703}. She reports exercising ***. She reports she is sleeping {DESC; WELL/FAIRLY WELL/POORLY:18703}.   Review of Systems  Constitutional: Negative.   HENT: Negative.   Eyes: Negative.   Respiratory: Negative.   Cardiovascular: Negative.   Gastrointestinal: Negative.   Endocrine: Negative.   Genitourinary: Negative.   Musculoskeletal: Negative.   Skin: Negative.   Allergic/Immunologic: Negative.   Neurological: Negative.   Hematological: Negative.   Psychiatric/Behavioral: Negative.     Social History   Socioeconomic History   Marital status: Married    Spouse name: Gwyndolyn Saxon   Number of children: 2   Years of education: 12   Highest education level: High school graduate  Occupational History   Occupation: stay at home  Odin resource strain: Not on file   Food insecurity    Worry: Not on file    Inability: Not on file   Transportation needs    Medical: Not on file    Non-medical: Not on file  Tobacco Use   Smoking status: Former Smoker    Packs/day: 0.25    Types: Cigarettes    Quit date: 06/16/2004    Years since quitting: 14.5   Smokeless tobacco: Never Used   Tobacco comment: Socially-no longer  Substance and Sexual Activity   Alcohol use: Yes    Alcohol/week: 1.0 standard drinks    Types: 1 Cans of beer per week    Comment: Occasionally-wine and beer   Drug use: No   Sexual activity: Yes    Partners: Male    Birth control/protection: Surgical  Lifestyle   Physical activity    Days per week: Not on file    Minutes per session: Not on file   Stress: Not on file    Relationships   Social connections    Talks on phone: Not on file    Gets together: Not on file    Attends religious service: Not on file    Active member of club or organization: Not on file    Attends meetings of clubs or organizations: Not on file    Relationship status: Not on file   Intimate partner violence    Fear of current or ex partner: Not on file    Emotionally abused: Not on file    Physically abused: Not on file    Forced sexual activity: Not on file  Other Topics Concern   Not on file  Social History Narrative   Not on file    Past Medical History:  Diagnosis Date   Anxiety    Basal cell carcinoma    nose   Bleeding disorder ([place])    Bronchial asthma    Cancer ([place])    melanoma; right knee   Cavovarus deformity of foot    bilateral, with L 5th bunionette (Dr. Gigi Gin ortho)   Chronic venous insufficiency 2007   s/p vein stripping and laser ablation   Depression    Diverticulosis    mild by colonoscopy   History of DVT (deep vein thrombosis)    DVTs after 1st pregnancy, not on AC 2/2  bleeding ulcer, greenfield filter in place   History of gastric ulcer 2009   History of kidney stones    Horseshoe kidney    sole, R kidney damage   HTN (hypertension)    Hypothyroidism    Kidney stones    s/p renal hematoma after lithotripsy on right   Lupus anticoagulant disorder (HCC)    Multinodular goiter    s/p thyroidectomy   Nasal septal perforation    chronic, ENT rec avoid antihistamine, INS   Osteoarthritis    ?FM by rheum   Overweight    Post-surgical hypothyroidism    for multinodular goiter   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 Frutoso Chase   Seasonal allergies    allergy shots, singulair   Sight deterioration    disc in back   Tachycardia     Patient Active Problem List   Diagnosis Date Noted   Bariatric surgery status    Eosinophilic esophagitis    Benign neoplasm of ascending colon    Benign neoplasm of  cecum    Diverticulosis of large intestine without diverticulitis    History of gastric bypass 06/01/2018   Esophageal dysphagia 05/06/2018   Secondary hyperparathyroidism, non-renal ([place]) 05/02/2017   CKD (chronic kidney disease) stage 2, GFR 60-89 ml/min 05/02/2017   Primary localized osteoarthritis of left hip 03/19/2017   History of DVT (deep vein thrombosis) 08/25/2016   PAD (peripheral artery disease) ([place]) 08/25/2016   Chronic venous insufficiency 08/25/2016   Iron deficiency anemia 03/25/2016   Primary osteoarthritis of right hip 11/06/2015   History of kidney stones 09/03/2015   Osteoarthritis    Fibromyalgia 02/14/2014   Atrial tachycardia ([place]) 01/25/2013   Hypothyroidism, postsurgical 02/25/2012   Edema 01/28/2012   Headache(784.0) 09/25/2011   Bronchial asthma    Healthcare maintenance 03/18/2011   Hot flashes 03/18/2011   Nasal septal perforation    Horseshoe kidney    HTN (hypertension)    Depression    Lupus anticoagulant disorder ([place])    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    Past Surgical History:  Procedure Laterality Date   ABDOMINAL HERNIA REPAIR     ABDOMINAL HYSTERECTOMY     ANKLE FUSION Left 05/2013   APPENDECTOMY  1978   BACK SURGERY  2009   CESAREAN SECTION     X 2   CHOLECYSTECTOMY  1998   COLONOSCOPY  05/2012   hyperplastic polyps x2, mild diverticulosis Deatra Ina) rpt 10 yrs   COLONOSCOPY WITH PROPOFOL N/A 07/01/2018   Procedure: COLONOSCOPY WITH PROPOFOL;  Surgeon: Virgel Manifold, MD;  Location: [facility] END[facility];  Service: Endoscopy;  Laterality: N/A;   CYST EXCISION     back  and shoulder   ESI Right 10/2015, 02/2016   C4/5 then C5/6 ESI ([place])   ESI Bilateral 06/2015, 08/2015, 03/2016, 09/2017   L4/5 transforaminal ESI ([place])   ESOPHAGOGASTRODUODENOSCOPY (EGD) WITH PROPOFOL N/A 07/01/2018   Procedure: ESOPHAGOGASTRODUODENOSCOPY (EGD) WITH PROPOFOL;  Surgeon: Virgel Manifold, MD;  Location: [facility]  ENDOSCOPY;  Service: Endoscopy;  Laterality: N/A;   FL HIP INJECTION ([place] HX) Left 11/2016   Dr Sharlet Salina   FUSION OF TALONAVICULAR JOINT Left 09/12/2016   Procedure: FUSION OF TALONAVICULAR JOINT WITH TRINITY BONE GRAFT;  Surgeon: Samara Deist, DPM;  Location: [facility] OR[facility];  Service: Podiatry;  Laterality: Left;   GASTRIC BYPASS  05/21/05   Dr. Frutoso Chase, [place]   Greenfield filter removal  2015   removed 6 wks after surgery   hammerhead toes Left    HERNIA  REPAIR     X 3   IVC FILTER INSERTION N/A 09/09/2016   Procedure: IVC Filter Insertion;  Surgeon: Katha Cabal, MD;  Location: [place] [facility];  Service: Cardiovascular;  Laterality: N/A;   IVC FILTER REMOVAL N/A 11/25/2016   Procedure: IVC Filter Removal;  Surgeon: Katha Cabal, MD;  Location: [place] [facility];  Service: Cardiovascular;  Laterality: N/A;   JOINT REPLACEMENT     lyphoma excision  08/2015   arm   NASAL SEPTUM SURGERY  1981   deviated septum, repaired at [place]   ovaries remain   SKIN SURGERY     basel cell   TONSILLECTOMY  1969   TOTAL HIP ARTHROPLASTY Right 11/06/2015   Procedure: TOTAL HIP ARTHROPLASTY ANTERIOR APPROACH;  Surgeon: Hessie Knows, MD;  Location: [facility];  Service: Orthopedics;  Laterality: Right;   TOTAL HIP ARTHROPLASTY Left 03/19/2017   Procedure: TOTAL HIP ARTHROPLASTY ANTERIOR APPROACH;  Surgeon: Hessie Knows, MD;  Location: [facility];  Service: Orthopedics;  Laterality: Left;   TOTAL KNEE ARTHROPLASTY Left 08/2013   TOTAL THYROIDECTOMY  09/2011   benign path (done for multinodular goiter concern for cancer)    Her family history includes Alcohol abuse in her father; Alzheimer's disease in her brother and mother; Brain cancer in her maternal uncle; Coronary artery disease (age of onset: 90) in her father; Diabetes in her father, maternal grandmother, mother, and paternal grandmother; Hyperlipidemia in her mother; Lung cancer in her  brother and father. There is no history of Stroke, Colon cancer, Stomach cancer, Kidney disease, Breast cancer, Kidney cancer, Bladder Cancer, or Ovarian cancer.@   Current Outpatient Medications:    acetaminophen (TYLENOL) 500 MG tablet, Take 1,000 mg by mouth 2 (two) times daily. , Disp: , Rfl:    ACETAMINOPHEN-BUTALBITAL 50-325 MG TABS, Take 1 tablet by mouth at bedtime as needed (sleep). Reported on 09/03/2015 3 times a week, Disp: , Rfl:    albuterol (PROVENTIL HFA;VENTOLIN HFA) 108 (90 Base) MCG/ACT inhaler, Inhale 2 puffs into the lungs every 6 (six) hours as needed for wheezing or shortness of breath., Disp: 1 Inhaler, Rfl: 3   aspirin 81 MG tablet, Take 81 mg by mouth daily., Disp: , Rfl:    benazepril (LOTENSIN) 10 MG tablet, Take 1 tablet (10 mg total) by mouth daily., Disp: 90 tablet, Rfl: 3   bisacodyl (DULCOLAX) 5 MG EC tablet, Take 5 mg by mouth daily as needed for moderate constipation., Disp: , Rfl:    buPROPion (WELLBUTRIN SR) 150 MG 12 hr tablet, Take 1 tablet (150 mg total) by mouth 2 (two) times daily., Disp: 180 tablet, Rfl: 3   calcium carbonate (CALCIUM 600) 600 MG TABS tablet, Take 1 tablet (600 mg total) 2 (two) times daily with a meal by mouth., Disp: 60 tablet, Rfl:    carisoprodol (SOMA) 350 MG tablet, Take 1 tablet (350 mg total) by mouth at bedtime., Disp: 90 tablet, Rfl: 1   cetirizine (ZYRTEC) 10 MG tablet, Take 10 mg by mouth daily., Disp: , Rfl:    Cholecalciferol (VITAMIN D3) 1000 UNITS tablet, Take 2,000 Units by mouth daily. , Disp: , Rfl:    citalopram (CELEXA) 40 MG tablet, Take 1 tablet (40 mg total) by mouth daily., Disp: 90 tablet, Rfl: 3   cyclobenzaprine (FLEXERIL) 10 MG tablet, cyclobenzaprine 10 mg tablet, Disp: , Rfl:    enoxaparin (LOVENOX) 150 MG/ML injection, Inject 150 mg into the skin every other day.,  Disp: , Rfl:    Enoxaparin Sodium (LOVENOX IJ), Inject 175 mg as directed every other day., Disp: , Rfl:    EPINEPHrine (EPI-PEN)  0.3 mg/0.3 mL DEVI, Inject 0.3 mLs (0.3 mg total) into the muscle once. (Patient taking differently: Inject 0.3 mg into the muscle once. ), Disp: 1 Device, Rfl: 2   estradiol (ESTRACE) 2 MG tablet, Take 1 tablet by mouth daily. , Disp: , Rfl: 0   Fluticasone-Salmeterol (ADVAIR DISKUS) 100-50 MCG/DOSE AEPB, Inhale 1 puff into the lungs 2 (two) times daily., Disp: 60 each, Rfl: 11   furosemide (LASIX) 20 MG tablet, Take 1 tablet (20 mg total) by mouth daily., Disp: 90 tablet, Rfl: 2   levothyroxine (SYNTHROID, LEVOTHROID) 150 MCG tablet, Take 175 mcg by mouth daily before breakfast., Disp: , Rfl:    liothyronine (CYTOMEL) 5 MCG tablet, liothyronine 5 mcg tablet, Disp: , Rfl:    metoprolol tartrate (LOPRESSOR) 25 MG tablet, Take 1 tablet (25 mg total) by mouth 2 (two) times daily., Disp: 180 tablet, Rfl: 3   montelukast (SINGULAIR) 10 MG tablet, Take 10 mg by mouth every morning. , Disp: , Rfl:    ondansetron (ZOFRAN) 4 MG tablet, Take 1 tablet (4 mg total) by mouth every 8 (eight) hours as needed for nausea or vomiting., Disp: 30 tablet, Rfl: 0   pantoprazole (PROTONIX) 20 MG tablet, TAKE 1 TABLET BY MOUTH ONCE DAILY (TAKE  30  MINUTES  BEFORE  BREAKFAST), Disp: 90 tablet, Rfl: 0   SUMAtriptan (IMITREX) 50 MG tablet, Take 50 mg by mouth every 2 (two) hours as needed for migraine. May repeat in 2 hours if headache persists or recurs., Disp: , Rfl:    traZODone (DESYREL) 50 MG tablet, TAKE 1/2 TO 1 (ONE-HALF TO ONE) TABLET BY MOUTH AT BEDTIME AS NEEDED FOR SLEEP, Disp: 30 tablet, Rfl: 0   Turmeric 500 MG CAPS, Take 1 capsule by mouth 2 (two) times daily., Disp: , Rfl:    vitamin B-12 (CYANOCOBALAMIN) 500 MCG tablet, Take 500 mcg by mouth daily., Disp: , Rfl:    Zoster Vaccine Adjuvanted ([place]) injection, Shingrix (PF) 50 mcg/0.5 mL intramuscular suspension, kit, Disp: , Rfl:    Patient Care Team: Virginia Crews, MD as PCP - General (Family Medicine) Samara Deist, DPM as  Referring Physician (Podiatry) Samara Deist, DPM as Referring Physician (Podiatry) Schnier, Dolores Lory, MD (Vascular Surgery)   Objective:    Vitals: There were no vitals taken for this visit.  Physical Exam  Activities of Daily Living No flowsheet data found.  Fall Risk Assessment Fall Risk  11/03/2017  Falls in the past year? Yes  Number falls in past yr: 2 or more  Injury with Fall? No  Follow up Falls evaluation completed     Depression Screen PHQ 2/9 Scores 05/06/2018 02/03/2018 11/03/2017 04/30/2017  PHQ - 2 Score 4 2 0 5  PHQ- 9 Score _0 No flowsheet data found.    Assessment & Plan:     Annual Wellness Visit  Reviewed patient's Family Medical History Reviewed and updated list of patient's medical providers Assessment of cognitive impairment was done Assessed patient's functional ability Established a written schedule for health screening [place] Completed and Reviewed  Exercise Activities and Dietary recommendations Goals   None     Immunization History  Administered Date(s) Administered   Influenza Split 03/18/2011, 03/31/2012   Influenza,inj,Quad PF,6+ Mos 03/18/2013, 04/04/2014, 04/30/2017, 02/03/2018   Tdap 12/19/2010  Health Maintenance  Topic Date Due   HIV Screening  07/31/1976   INFLUENZA VACCINE  01/15/2019   MAMMOGRAM  12/23/2019   TETANUS/TDAP  12/18/2020   COLONOSCOPY  07/01/2028   Hepatitis C Screening  Completed     Discussed health benefits of physical activity, and encouraged her to engage in regular exercise appropriate for her age and condition.    ------------------------------------------------------------------------------------------------------------  Lavon Paganini, MD  [place]

## 2019-01-19 ENCOUNTER — Ambulatory Visit (INDEPENDENT_AMBULATORY_CARE_PROVIDER_SITE_OTHER): Payer: Medicare Other | Admitting: Family Medicine

## 2019-01-19 ENCOUNTER — Encounter: Payer: Self-pay | Admitting: Family Medicine

## 2019-01-19 ENCOUNTER — Other Ambulatory Visit: Payer: Self-pay

## 2019-01-19 VITALS — BP 108/72 | HR 58 | Temp 98.5°F | Wt 240.8 lb

## 2019-01-19 DIAGNOSIS — Z9884 Bariatric surgery status: Secondary | ICD-10-CM | POA: Diagnosis not present

## 2019-01-19 DIAGNOSIS — Z0000 Encounter for general adult medical examination without abnormal findings: Secondary | ICD-10-CM

## 2019-01-19 DIAGNOSIS — E669 Obesity, unspecified: Secondary | ICD-10-CM | POA: Insufficient documentation

## 2019-01-19 NOTE — Patient Instructions (Signed)

## 2019-01-19 NOTE — Progress Notes (Signed)
Patient: Claire Strong, Female    DOB: [birth year], 58 y.o.   MRN: [id number] Visit Date: 01/19/2019  Today's Provider: Lavon Paganini, MD   Chief Complaint  Patient presents with  . Medicare Wellness   Subjective:    Initial preventative physical exam Claire Strong is a 58 y.o. female who presents today for her Initial Preventative Physical Exam. She feels fairly well. She reports exercising not regularly. She reports she is sleeping fairly well.   Review of Systems  Constitutional: Negative.   HENT: Negative.   Eyes: Negative.   Respiratory: Negative.   Cardiovascular: Negative.   Gastrointestinal: Negative.   Endocrine: Negative.   Genitourinary: Negative.   Musculoskeletal: Positive for arthralgias, back pain and joint swelling.  Skin: Negative.   Allergic/Immunologic: Positive for environmental allergies.  Neurological: Positive for headaches.  Hematological: Bruises/bleeds easily.  Psychiatric/Behavioral: Negative.     Social History   Socioeconomic History  . Marital status: Married    Spouse name: Gwyndolyn Saxon  . Number of children: 2  . Years of education: 70  . Highest education level: High school graduate  Occupational History  . Occupation: stay at home  Social Needs  . Financial resource strain: Not on file  . Food insecurity    Worry: Not on file    Inability: Not on file  . Transportation needs    Medical: Not on file    Non-medical: Not on file  Tobacco Use  . Smoking status: Former Smoker    Packs/day: 0.25    Types: Cigarettes    Quit date: 06/16/2004    Years since quitting: 14.6  . Smokeless tobacco: Never Used  . Tobacco comment: Socially-no longer  Substance and Sexual Activity  . Alcohol use: Yes    Alcohol/week: 1.0 standard drinks    Types: 1 Cans of beer per week    Comment: Occasionally-wine and beer  . Drug use: No  . Sexual activity: Yes    Partners: Male    Birth control/protection: Surgical  Lifestyle  . Physical  activity    Days per week: Not on file    Minutes per session: Not on file  . Stress: Not on file  Relationships  . Social Herbalist on phone: Not on file    Gets together: Not on file    Attends religious service: Not on file    Active member of club or organization: Not on file    Attends meetings of clubs or organizations: Not on file    Relationship status: Not on file  . Intimate partner violence    Fear of current or ex partner: Not on file    Emotionally abused: Not on file    Physically abused: Not on file    Forced sexual activity: Not on file  Other Topics Concern  . Not on file  Social History Narrative  . Not on file    Past Medical History:  Diagnosis Date  . Anxiety   . Basal cell carcinoma    nose  . Bleeding disorder ([place])   . Bronchial asthma   . Cancer ([employer])    melanoma; right knee  . 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 Gigi Gin ortho)  . Chronic venous insufficiency 2007   s/p vein stripping and laser ablation  . Depression   . Diverticulosis    mild by colonoscopy  . History of DVT (deep vein  thrombosis)    DVTs after 1st pregnancy, not on AC 2/2 bleeding ulcer, greenfield filter in place  . History of gastric ulcer 2009  . History of kidney stones   . Horseshoe kidney    sole, R kidney damage  . HTN (hypertension)   . Hypothyroidism   . Kidney stones    s/p renal hematoma after lithotripsy on right  . Lupus anticoagulant disorder ([place])   . Multinodular goiter    s/p thyroidectomy  . Nasal septal perforation    chronic, ENT rec avoid antihistamine, INS  . Osteoarthritis    ?FM by rheum  . Overweight   . Post-surgical hypothyroidism    for multinodular goiter  . 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 Frutoso Chase  . Seasonal allergies    allergy shots, singulair  . 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 Tachycardia      Patient Active Problem List   Diagnosis Date Noted  . Bariatric surgery  status   . Eosinophilic esophagitis   . Benign neoplasm of ascending colon   . Benign neoplasm of cecum   . Diverticulosis of large intestine without diverticulitis   . History of gastric bypass 06/01/2018  . Esophageal dysphagia 05/06/2018  . Secondary hyperparathyroidism, non-renal ([place]) 05/02/2017  . CKD (chronic kidney disease) stage 2, GFR 60-89 ml/min 05/02/2017  . Primary localized osteoarthritis of left hip 03/19/2017  . History of DVT (deep vein thrombosis) 08/25/2016  . PAD (peripheral artery disease) ([place]) 08/25/2016  . Chronic venous insufficiency 08/25/2016  . Iron deficiency anemia 03/25/2016  . Primary osteoarthritis of right hip 11/06/2015  . History of kidney stones 09/03/2015  . Osteoarthritis   . Fibromyalgia 02/14/2014  . Atrial tachycardia ([place]) 01/25/2013  . Hypothyroidism, postsurgical 02/25/2012  . Edema 01/28/2012  . Headache(784.0) 09/25/2011  . Bronchial asthma   . Healthcare maintenance 03/18/2011  . Hot flashes 03/18/2011  . Nasal septal perforation   . Horseshoe kidney   . HTN (hypertension)   . Depression   . Lupus anticoagulant disorder ([place])   . 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 ABDOMINAL HERNIA REPAIR    . ABDOMINAL HYSTERECTOMY    . ANKLE FUSION Left 05/2013  . APPENDECTOMY  1978  . BACK SURGERY  2009  . CESAREAN SECTION     X 2  . CHOLECYSTECTOMY  1998  . COLONOSCOPY  05/2012   hyperplastic polyps x2, mild diverticulosis Deatra Ina) rpt 10 yrs  . COLONOSCOPY WITH PROPOFOL N/A 07/01/2018   Procedure: COLONOSCOPY WITH PROPOFOL;  Surgeon: Virgel Manifold, MD;  Location: [facility];  Service: Endoscopy;  Laterality: N/A;  . CYST EXCISION     back  and shoulder  . ESI Right 10/2015, 02/2016   C4/5 then C5/6 ESI ([place])  . ESI Bilateral 06/2015, 08/2015, 03/2016, 09/2017   L4/5 transforaminal ESI ([place])  . ESOPHAGOGASTRODUODENOSCOPY (EGD) WITH PROPOFOL N/A 07/01/2018    Procedure: ESOPHAGOGASTRODUODENOSCOPY (EGD) WITH PROPOFOL;  Surgeon: Virgel Manifold, MD;  Location: [facility];  Service: Endoscopy;  Laterality: N/A;  . FL HIP INJECTION ([place] HX) Left 11/2016   Dr Sharlet Salina  . FUSION OF TALONAVICULAR JOINT Left 09/12/2016   Procedure: FUSION OF TALONAVICULAR JOINT WITH TRINITY BONE GRAFT;  Surgeon: Samara Deist, DPM;  Location: [facility] OR[facility];  Service: Podiatry;  Laterality: Left;  Marland Kitchen GASTRIC BYPASS  05/21/05   Dr. Frutoso Chase, Roux-en-Y  . Greenfield filter removal  2015   removed  6 wks after surgery  . hammerhead toes Left   . HERNIA REPAIR     X 3  . IVC FILTER INSERTION N/A 09/09/2016   Procedure: IVC Filter Insertion;  Surgeon: Katha Cabal, MD;  Location: [place] [facility];  Service: Cardiovascular;  Laterality: N/A;  . IVC FILTER REMOVAL N/A 11/25/2016   Procedure: IVC Filter Removal;  Surgeon: Katha Cabal, MD;  Location: [place] [facility];  Service: Cardiovascular;  Laterality: N/A;  . JOINT REPLACEMENT    . lyphoma excision  08/2015   arm  . NASAL SEPTUM SURGERY  1981   deviated septum, repaired at [facility]  . PARTIAL HYSTERECTOMY  1989   ovaries remain  . SKIN SURGERY     basel cell  . TONSILLECTOMY  1969  . TOTAL HIP ARTHROPLASTY Right 11/06/2015   Procedure: TOTAL HIP ARTHROPLASTY ANTERIOR APPROACH;  Surgeon: Hessie Knows, MD;  Location: [facility];  Service: Orthopedics;  Laterality: Right;  . TOTAL HIP ARTHROPLASTY Left 03/19/2017   Procedure: TOTAL HIP ARTHROPLASTY ANTERIOR APPROACH;  Surgeon: Hessie Knows, MD;  Location: [facility];  Service: Orthopedics;  Laterality: Left;  . TOTAL KNEE ARTHROPLASTY Left 08/2013  . TOTAL THYROIDECTOMY  09/2011   benign path (done for multinodular goiter concern for cancer)    Her family history includes Alcohol abuse in her father; Alzheimer's disease in her brother and mother; Brain cancer in her maternal uncle; Coronary artery disease (age of onset: 87) in her father; Diabetes in her father,  maternal grandmother, mother, and paternal grandmother; Hyperlipidemia in her mother; Lung cancer in her brother and father. There is no history of Stroke, Colon cancer, Stomach cancer, Kidney disease, Breast cancer, Kidney cancer, Bladder Cancer, or Ovarian cancer.   Current Outpatient Medications:  .  acetaminophen (TYLENOL) 500 MG tablet, Take 1,000 mg by mouth 2 (two) times daily. , Disp: , Rfl:  .  ACETAMINOPHEN-BUTALBITAL 50-325 MG TABS, Take 1 tablet by mouth at bedtime as needed (sleep). Reported on 09/03/2015 3 times a week, Disp: , Rfl:  .  albuterol (PROVENTIL HFA;VENTOLIN HFA) 108 (90 Base) MCG/ACT inhaler, Inhale 2 puffs into the lungs every 6 (six) hours as needed for wheezing or shortness of breath., Disp: 1 Inhaler, Rfl: 3 .  aspirin 81 MG tablet, Take 81 mg by mouth daily., Disp: , Rfl:  .  benazepril (LOTENSIN) 10 MG tablet, Take 1 tablet (10 mg total) by mouth daily., Disp: 90 tablet, Rfl: 3 .  bisacodyl (DULCOLAX) 5 MG EC tablet, Take 5 mg by mouth daily as needed for moderate constipation., Disp: , Rfl:  .  buPROPion (WELLBUTRIN SR) 150 MG 12 hr tablet, Take 1 tablet (150 mg total) by mouth 2 (two) times daily., Disp: 180 tablet, Rfl: 3 .  calcium carbonate (CALCIUM 600) 600 MG TABS tablet, Take 1 tablet (600 mg total) 2 (two) times daily with a meal by mouth., Disp: 60 tablet, Rfl:  .  carisoprodol (SOMA) 350 MG tablet, Take 1 tablet (350 mg total) by mouth at bedtime., Disp: 90 tablet, Rfl: 1 .  cetirizine (ZYRTEC) 10 MG tablet, Take 10 mg by mouth daily., Disp: , Rfl:  .  Cholecalciferol (VITAMIN D3) 1000 UNITS tablet, Take 2,000 Units by mouth daily. , Disp: , Rfl:  .  citalopram (CELEXA) 40 MG tablet, Take 1 tablet (40 mg total) by mouth daily., Disp: 90 tablet, Rfl: 3 .  cyclobenzaprine (FLEXERIL) 10 MG tablet, cyclobenzaprine 10 mg tablet, Disp: , Rfl:  .  enoxaparin (  LOVENOX) 150 MG/ML injection, Inject 150 mg into the skin every other day., Disp: , Rfl:  .  Enoxaparin  Sodium (LOVENOX IJ), Inject 175 mg as directed every other day., Disp: , Rfl:  .  EPINEPHrine (EPI-PEN) 0.3 mg/0.3 mL DEVI, Inject 0.3 mLs (0.3 mg total) into the muscle once. (Patient taking differently: Inject 0.3 mg into the muscle once. ), Disp: 1 Device, Rfl: 2 .  estradiol (ESTRACE) 2 MG tablet, Take 1 tablet by mouth daily. , Disp: , Rfl: 0 .  Fluticasone-Salmeterol (ADVAIR DISKUS) 100-50 MCG/DOSE AEPB, Inhale 1 puff into the lungs 2 (two) times daily., Disp: 60 each, Rfl: 11 .  furosemide (LASIX) 20 MG tablet, Take 1 tablet (20 mg total) by mouth daily., Disp: 90 tablet, Rfl: 2 .  levothyroxine (SYNTHROID, LEVOTHROID) 150 MCG tablet, Take 175 mcg by mouth daily before breakfast., Disp: , Rfl:  .  liothyronine (CYTOMEL) 5 MCG tablet, liothyronine 5 mcg tablet, Disp: , Rfl:  .  metoprolol tartrate (LOPRESSOR) 25 MG tablet, Take 1 tablet (25 mg total) by mouth 2 (two) times daily., Disp: 180 tablet, Rfl: 3 .  montelukast (SINGULAIR) 10 MG tablet, Take 10 mg by mouth every morning. , Disp: , Rfl:  .  ondansetron (ZOFRAN) 4 MG tablet, Take 1 tablet (4 mg total) by mouth every 8 (eight) hours as needed for nausea or vomiting., Disp: 30 tablet, Rfl: 0 .  pantoprazole (PROTONIX) 20 MG tablet, TAKE 1 TABLET BY MOUTH ONCE DAILY (TAKE  30  MINUTES  BEFORE  BREAKFAST), Disp: 90 tablet, Rfl: 0 .  SUMAtriptan (IMITREX) 50 MG tablet, Take 50 mg by mouth every 2 (two) hours as needed for migraine. May repeat in 2 hours if headache persists or recurs., Disp: , Rfl:  .  traZODone (DESYREL) 50 MG tablet, TAKE 1/2 TO 1 (ONE-HALF TO ONE) TABLET BY MOUTH AT BEDTIME AS NEEDED FOR SLEEP, Disp: 30 tablet, Rfl: 0 .  Turmeric 500 MG CAPS, Take 1 capsule by mouth 2 (two) times daily., Disp: , Rfl:  .  vitamin B-12 (CYANOCOBALAMIN) 500 MCG tablet, Take 500 mcg by mouth daily., Disp: , Rfl:  .  Zoster Vaccine Adjuvanted (SHINGRIX) injection, Shingrix (PF) 50 mcg/0.5 mL intramuscular suspension, kit, Disp: , Rfl:     Patient Care Team: Virginia Crews, MD as PCP - General (Family Medicine) Samara Deist, DPM as Referring Physician (Podiatry) Samara Deist, DPM as Referring Physician (Podiatry) Schnier, Dolores Lory, MD (Vascular Surgery)   Objective:    Vitals: BP 108/72 (BP Location: Left Arm, Patient Position: Sitting, Cuff Size: Large)   Pulse (!) 58   Temp 98.5 F (36.9 C) (Oral)   Wt 240 lb 12.8 oz (109.2 kg)   BMI 36.61 kg/m   Physical Exam Vitals signs reviewed.  Constitutional:      General: She is not in acute distress.    Appearance: Normal appearance. She is well-developed. She is not diaphoretic.  HENT:     Head: Normocephalic and atraumatic.     Right Ear: Tympanic membrane, ear canal and external ear normal.     Left Ear: Tympanic membrane, ear canal and external ear normal.  Eyes:     General: No scleral icterus.    Conjunctiva/sclera: Conjunctivae normal.     Pupils: Pupils are equal, round, and reactive to light.  Neck:     Musculoskeletal: Neck supple.     Thyroid: No thyromegaly.  Cardiovascular:     Rate and Rhythm: Normal rate and regular rhythm.  Pulses: Normal pulses.     Heart sounds: Normal heart sounds. No murmur.  Pulmonary:     Effort: Pulmonary effort is normal. No respiratory distress.     Breath sounds: Normal breath sounds. No wheezing or rales.  Abdominal:     General: There is no distension.     Palpations: Abdomen is soft.     Tenderness: There is no abdominal tenderness.  Musculoskeletal:        General: No deformity.     Right lower leg: No edema.     Left lower leg: No edema.  Lymphadenopathy:     Cervical: No cervical adenopathy.  Skin:    General: Skin is warm and dry.     Capillary Refill: Capillary refill takes less than 2 seconds.     Findings: No rash.  Neurological:     Mental Status: She is alert and oriented to person, place, and time. Mental status is at baseline.  Psychiatric:        Mood and Affect: Mood normal.         Behavior: Behavior normal.        Thought Content: Thought content normal.      No exam data present  Activities of Daily Living In your present state of health, do you have any difficulty performing the following activities: 01/19/2019  Hearing? N  Vision? N  Difficulty concentrating or making decisions? N  Walking or climbing stairs? N  Dressing or bathing? N  Doing errands, shopping? Y  Some recent data might be hidden    Fall Risk Assessment Fall Risk  01/19/2019 11/03/2017  Falls in the past year? 1 Yes  Number falls in past yr: 1 2 or more  Comment 5 per pt -  Injury with Fall? 0 No  Follow up - Falls evaluation completed     Depression Screen PHQ 2/9 Scores 01/19/2019 05/06/2018 02/03/2018 11/03/2017  PHQ - 2 Score '4 4 2 '$ 0  PHQ- 9 Score '12 18 13 7    '$ No flowsheet data found.  EKG: NSR    Assessment & Plan:     Initial Preventative Physical Exam  Reviewed patient's Family Medical History Reviewed and updated list of patient's medical providers Assessment of cognitive impairment was done Assessed patient's functional ability Established a written schedule for health screening [place] Completed and Reviewed  Exercise Activities and Dietary recommendations Goals   None     Immunization History  Administered Date(s) Administered  . Influenza Split 03/18/2011, 03/31/2012  . Influenza,inj,Quad PF,6+ Mos 03/18/2013, 04/04/2014, 04/30/2017, 02/03/2018  . Tdap 12/19/2010    Health Maintenance  Topic Date Due  . HIV Screening  07/31/1976  . INFLUENZA VACCINE  01/15/2019  . MAMMOGRAM  12/23/2019  . TETANUS/TDAP  12/18/2020  . COLONOSCOPY  07/01/2028  . Hepatitis C Screening  Completed     Discussed health benefits of physical activity, and encouraged her to engage in regular exercise appropriate for her age and condition.     ------------------------------------------------------------------------------------------------------------  Problem List Items Addressed This Visit      Other   History of gastric bypass   Morbid obesity ([place])    Other Visit Diagnoses    Welcome to Medicare preventive visit    -  Primary   Relevant Orders   EKG 12-Lead       Return in about 6 months (around 07/22/2019) for chronic disease f/u.   The entirety of the information documented in the History of Present  Illness, Review of Systems and Physical Exam were personally obtained by me. Portions of this information were initially documented by [facility], CMA and reviewed by me for thoroughness and accuracy.    Cannie Muckle, Dionne Bucy, MD MPH [facility]

## 2019-01-26 ENCOUNTER — Encounter: Payer: Self-pay | Admitting: Gastroenterology

## 2019-01-26 ENCOUNTER — Ambulatory Visit (INDEPENDENT_AMBULATORY_CARE_PROVIDER_SITE_OTHER): Payer: Medicare Other | Admitting: Gastroenterology

## 2019-01-26 ENCOUNTER — Other Ambulatory Visit: Payer: Self-pay

## 2019-01-26 VITALS — BP 115/77 | HR 66 | Temp 98.0°F | Ht 68.0 in | Wt 238.4 lb

## 2019-01-26 DIAGNOSIS — R1319 Other dysphagia: Secondary | ICD-10-CM

## 2019-01-26 DIAGNOSIS — R1310 Dysphagia, unspecified: Secondary | ICD-10-CM

## 2019-01-26 NOTE — Progress Notes (Signed)
Claire Antigua, MD [address]  [place]  [place], [place] [address]  Main: [phone]  Fax: [phone]   Primary Care Physician: Virginia Crews, MD   Chief Complaint  Patient presents with  . Gastroesophageal Reflux    Patient is currently taking Protonix '20mg'$  once a day. Patient states she stays away from fatty foods and has no issues     HPI: Claire Strong is a 58 y.o. female here for follow-up of dysphagia.  Reports dysphagia with steaks or meats.  No episodes of food impaction.  Attributes her symptoms to her thyroid surgery done 4 years ago.  States symptoms started since then.  Heartburn well controlled Protonix once daily.  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  Also has iron deficiency anemia.  No episodes of food impaction.  Patient underwent EGD and colonoscopy.  Colonoscopy showed a fair prep, with 3 subcentimeter polyps removed.  Diverticulosis reported.  Repeat recommended in 3 years due to fair prep.  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  Normal stomach.  Roux-en-Y anatomy with healthy-appearing mucosa.  Biopsies were not consistent with eosinophilic esophagitis.  Colon polyp showed tubular adenoma.     Current Outpatient Medications  Medication Sig Dispense Refill  . acetaminophen (TYLENOL) 500 MG tablet Take 1,000 mg by mouth 2 (two) times daily.     Marland Kitchen albuterol (PROVENTIL HFA;VENTOLIN HFA) 108 (90 Base) MCG/ACT inhaler Inhale 2 puffs into the lungs every 6 (six) hours as needed for wheezing or shortness of breath. 1 Inhaler 3  . aspirin 81 MG tablet Take 81 mg by mouth daily.    . benazepril (LOTENSIN) 10 MG tablet Take 1 tablet (10 mg total) by mouth daily. 90 tablet 3  . bisacodyl (DULCOLAX) 5 MG EC tablet Take 5 mg by mouth daily as needed for moderate constipation.    Marland Kitchen buPROPion (WELLBUTRIN SR) 150 MG 12 hr tablet Take 1 tablet (150 mg total) by mouth 2 (two) times daily.  180 tablet 3  . calcium carbonate (CALCIUM 600) 600 MG TABS tablet Take 1 tablet (600 mg total) 2 (two) times daily with a meal by mouth. 60 tablet   . cetirizine (ZYRTEC) 10 MG tablet Take 10 mg by mouth daily.    . Cholecalciferol (VITAMIN D3) 1000 UNITS tablet Take 2,000 Units by mouth daily.     . citalopram (CELEXA) 40 MG tablet Take 1 tablet (40 mg total) by mouth daily. 90 tablet 3  . cyclobenzaprine (FLEXERIL) 10 MG tablet cyclobenzaprine 10 mg tablet    . EPINEPHrine (EPI-PEN) 0.3 mg/0.3 mL DEVI Inject 0.3 mLs (0.3 mg total) into the muscle once. (Patient taking differently: Inject 0.3 mg into the muscle once. ) 1 Device 2  . estradiol (ESTRACE) 2 MG tablet Take 1 tablet by mouth daily.   0  . Fluticasone-Salmeterol (ADVAIR DISKUS) 100-50 MCG/DOSE AEPB Inhale 1 puff into the lungs 2 (two) times daily. 60 each 11  . furosemide (LASIX) 20 MG tablet Take 1 tablet (20 mg total) by mouth daily. 90 tablet 2  . levothyroxine (SYNTHROID, LEVOTHROID) 150 MCG tablet Take 175 mcg by mouth daily before breakfast.    . liothyronine (CYTOMEL) 5 MCG tablet liothyronine 5 mcg tablet    . metoprolol tartrate (LOPRESSOR) 25 MG tablet Take 1 tablet (25 mg total) by mouth 2 (two) times daily. 180 tablet 3  . montelukast (SINGULAIR) 10 MG tablet Take 10 mg by mouth every  morning.     . ondansetron (ZOFRAN) 4 MG tablet Take 1 tablet (4 mg total) by mouth every 8 (eight) hours as needed for nausea or vomiting. 30 tablet 0  . pantoprazole (PROTONIX) 20 MG tablet TAKE 1 TABLET BY MOUTH ONCE DAILY (TAKE  30  MINUTES  BEFORE  BREAKFAST) 90 tablet 0  . SUMAtriptan (IMITREX) 50 MG tablet Take 50 mg by mouth every 2 (two) hours as needed for migraine. May repeat in 2 hours if headache persists or recurs.    . traZODone (DESYREL) 50 MG tablet TAKE 1/2 TO 1 (ONE-HALF TO ONE) TABLET BY MOUTH AT BEDTIME AS NEEDED FOR SLEEP 30 tablet 0  . Turmeric 500 MG CAPS Take 1 capsule by mouth 2 (two) times daily.    . vitamin B-12  (CYANOCOBALAMIN) 500 MCG tablet Take 500 mcg by mouth daily.    . ACETAMINOPHEN-BUTALBITAL 50-325 MG TABS Take 1 tablet by mouth at bedtime as needed (sleep). Reported on 09/03/2015 3 times a week    . carisoprodol (SOMA) 350 MG tablet Take 1 tablet (350 mg total) by mouth at bedtime. (Patient not taking: Reported on 01/26/2019) 90 tablet 1  . Zoster Vaccine Adjuvanted [facility]) injection Shingrix (PF) 50 mcg/0.5 mL intramuscular suspension, kit     No current facility-administered medications for this visit.     Allergies as of 01/26/2019 - Review Complete 01/26/2019  Allergen Reaction Noted  . Sulfa antibiotics Shortness Of Breath 12/19/2010  . Sulfur Anaphylaxis 07/01/2018  . Cefuroxime axetil Other (See Comments) 09/03/2015  . Nutritional supplements Swelling 12/19/2010  . Other  03/19/2017  . Oxycodone-acetaminophen Nausea And Vomiting 09/03/2015  . Betadine [povidone iodine] Rash 03/08/2016    ROS:  General: Negative for anorexia, weight loss, fever, chills, fatigue, weakness. ENT: Negative for hoarseness, difficulty swallowing , nasal congestion. CV: Negative for chest pain, angina, palpitations, dyspnea on exertion, peripheral edema.  Respiratory: Negative for dyspnea at rest, dyspnea on exertion, cough, sputum, wheezing.  GI: See history of present illness. GU:  Negative for dysuria, hematuria, urinary incontinence, urinary frequency, nocturnal urination.  Endo: Negative for unusual weight change.    Physical Examination:   BP 115/77 (BP Location: Left Arm, Patient Position: Sitting, Cuff Size: Large)   Pulse 66   Temp 98 F (36.7 C)   Ht '5\' 8"'$  (1.727 m)   Wt 238 lb 6 oz (108.1 kg)   BMI 36.24 kg/m   General: Well-nourished, well-developed in no acute distress.  Eyes: No icterus. Conjunctivae pink. Mouth: Oropharyngeal mucosa moist and pink , no lesions erythema or exudate. Neck: Supple, Trachea midline Abdomen: Bowel sounds are normal, nontender, nondistended, no  hepatosplenomegaly or masses, no abdominal bruits or hernia , no rebound or guarding.   Extremities: No lower extremity edema. No clubbing or deformities. Neuro: Alert and oriented x 3.  Grossly intact. Skin: Warm and dry, no jaundice.   Psych: Alert and cooperative, normal mood and affect.   Labs: CMP     Component Value Date/Time   NA 138 06/01/2018 1511   NA 139 05/06/2018 0909   NA 132 (L) 08/27/2013 0616   K 3.9 06/01/2018 1511   K 4.5 08/27/2013 0616   CL 106 06/01/2018 1511   CL 102 08/27/2013 0616   CO2 26 06/01/2018 1511   CO2 27 08/27/2013 0616   GLUCOSE 93 06/01/2018 1511   GLUCOSE 107 (H) 08/27/2013 0616   BUN 14 06/01/2018 1511   BUN 13 05/06/2018 0909   BUN 12 08/27/2013  0616   CREATININE 0.90 10/08/2018 1255   CREATININE 0.91 08/27/2013 0616   CREATININE 1.02 09/20/2011   CALCIUM 8.5 (L) 06/01/2018 1511   CALCIUM 7.4 (L) 08/27/2013 0616   CALCIUM 7.7 09/20/2011   PROT 6.7 06/01/2018 1511   PROT 5.9 (L) 02/03/2018 1033   PROT 6.4 09/20/2011 1746   ALBUMIN 3.8 06/01/2018 1511   ALBUMIN 3.6 05/06/2018 0909   ALBUMIN 3.5 09/20/2011 1746   AST 21 06/01/2018 1511   AST 41 (H) 09/20/2011 1746   AST 41 09/20/2011   ALT 19 06/01/2018 1511   ALT 68 09/20/2011 1746   ALKPHOS 71 06/01/2018 1511   ALKPHOS 127 09/20/2011 1746   ALKPHOS 127 09/20/2011   BILITOT 0.5 06/01/2018 1511   BILITOT 0.3 02/03/2018 1033   BILITOT 0.4 09/20/2011 1746   BILITOT 0.4 09/20/2011   GFRNONAA >60 06/01/2018 1511   GFRNONAA >60 08/27/2013 0616   GFRAA >60 06/01/2018 1511   GFRAA >60 08/27/2013 0616   Lab Results  Component Value Date   WBC 5.4 12/06/2018   HGB 13.8 12/06/2018   HCT 40.1 12/06/2018   MCV 92.6 12/06/2018   PLT 194 12/06/2018    Imaging Studies: No results found.  Assessment and Plan:   Claire Strong is a 58 y.o. y/o female here for follow-up of dysphagia  EGD was unrevealing for cause of her symptoms Manometry discussed for further evaluation and  patient is agreeable  -Heartburn well-controlled with low-dose PPI once daily (Risks of PPI use were discussed with patient including bone loss, C. Diff diarrhea, pneumonia, infections, CKD, electrolyte abnormalities.  Pt. Verbalizes understanding and chooses to continue the medication.  Patient is on lowest dose possible PPI)  Follow-up in 3 to 6 months   Dr Claire Strong

## 2019-01-27 ENCOUNTER — Other Ambulatory Visit: Payer: Self-pay | Admitting: Family Medicine

## 2019-01-28 ENCOUNTER — Encounter: Payer: Self-pay | Admitting: Family Medicine

## 2019-02-01 ENCOUNTER — Ambulatory Visit (INDEPENDENT_AMBULATORY_CARE_PROVIDER_SITE_OTHER): Payer: Medicare Other | Admitting: Physician Assistant

## 2019-02-01 ENCOUNTER — Ambulatory Visit
Admission: RE | Admit: 2019-02-01 | Discharge: 2019-02-01 | Disposition: A | Discharge status: Home / Self Care | Payer: Medicare Other | Source: Ambulatory Visit | Attending: Physician Assistant | Admitting: Physician Assistant

## 2019-02-01 ENCOUNTER — Other Ambulatory Visit: Payer: Self-pay

## 2019-02-01 ENCOUNTER — Encounter: Payer: Self-pay | Admitting: Physician Assistant

## 2019-02-01 VITALS — BP 100/68 | HR 58 | Temp 96.8°F | Resp 16

## 2019-02-01 DIAGNOSIS — R0781 Pleurodynia: Secondary | ICD-10-CM

## 2019-02-01 DIAGNOSIS — M25551 Pain in right hip: Secondary | ICD-10-CM | POA: Diagnosis not present

## 2019-02-01 DIAGNOSIS — W19XXXA Unspecified fall, initial encounter: Secondary | ICD-10-CM

## 2019-02-01 DIAGNOSIS — R0789 Other chest pain: Secondary | ICD-10-CM | POA: Diagnosis not present

## 2019-02-01 DIAGNOSIS — I10 Essential (primary) hypertension: Secondary | ICD-10-CM | POA: Insufficient documentation

## 2019-02-01 DIAGNOSIS — S299XXA Unspecified injury of thorax, initial encounter: Secondary | ICD-10-CM | POA: Diagnosis not present

## 2019-02-01 DIAGNOSIS — J45909 Unspecified asthma, uncomplicated: Secondary | ICD-10-CM | POA: Insufficient documentation

## 2019-02-01 DIAGNOSIS — S79911A Unspecified injury of right hip, initial encounter: Secondary | ICD-10-CM | POA: Diagnosis not present

## 2019-02-01 DIAGNOSIS — Z87891 Personal history of nicotine dependence: Secondary | ICD-10-CM | POA: Diagnosis not present

## 2019-02-01 DIAGNOSIS — Z96641 Presence of right artificial hip joint: Secondary | ICD-10-CM | POA: Diagnosis not present

## 2019-02-01 DIAGNOSIS — R079 Chest pain, unspecified: Secondary | ICD-10-CM | POA: Diagnosis not present

## 2019-02-01 NOTE — Patient Instructions (Signed)

## 2019-02-01 NOTE — Progress Notes (Signed)
Patient: Claire Strong Female    DOB: [birth year]   58 y.o.   MRN: [id number] Visit Date: 02/01/2019  Today's Provider: Trinna Post, PA-C   Chief Complaint  Patient presents with  . Fall   Subjective:     HPI   Patient had a fall on 01/27/2019. Patient states she was walking through her house and tripped. Patient states she fell on left on her right side.  Patient is having pain on her right side when she breathes or moves the wrong way. Patient has been taking tylenol for the pain with mild relief. 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 She has a right knee bruise but no pain in her right knee. She does have leftover dilaudid from Dr. Rudene Christians which she reports is the only pain medication that doesn't make her sick.   Allergies  Allergen Reactions  . Sulfa Antibiotics Shortness Of Breath  . Sulfur Anaphylaxis  . Cefuroxime Axetil Other (See Comments)    Bleeding ulcer  . Nutritional Supplements Swelling    Unsure which  . Other     All narcotics   . Oxycodone-Acetaminophen Nausea And Vomiting  . Betadine [Povidone Iodine] Rash     Current Outpatient Medications:  .  acetaminophen (TYLENOL) 500 MG tablet, Take 1,000 mg by mouth 2 (two) times daily. , Disp: , Rfl:  .  ACETAMINOPHEN-BUTALBITAL 50-325 MG TABS, Take 1 tablet by mouth at bedtime as needed (sleep). Reported on 09/03/2015 3 times a week, Disp: , Rfl:  .  albuterol (PROVENTIL HFA;VENTOLIN HFA) 108 (90 Base) MCG/ACT inhaler, Inhale 2 puffs into the lungs every 6 (six) hours as needed for wheezing or shortness of breath., Disp: 1 Inhaler, Rfl: 3 .  aspirin 81 MG tablet, Take 81 mg by mouth daily., Disp: , Rfl:  .  benazepril (LOTENSIN) 10 MG tablet, Take 1 tablet (10 mg total) by mouth daily., Disp: 90 tablet, Rfl: 3 .  bisacodyl (DULCOLAX) 5 MG EC tablet, Take 5 mg by mouth daily as needed for moderate constipation., Disp: , Rfl:  .  buPROPion (WELLBUTRIN SR) 150 MG 12  hr tablet, Take 1 tablet (150 mg total) by mouth 2 (two) times daily., Disp: 180 tablet, Rfl: 3 .  calcium carbonate (CALCIUM 600) 600 MG TABS tablet, Take 1 tablet (600 mg total) 2 (two) times daily with a meal by mouth., Disp: 60 tablet, Rfl:  .  cetirizine (ZYRTEC) 10 MG tablet, Take 10 mg by mouth daily., Disp: , Rfl:  .  Cholecalciferol (VITAMIN D3) 1000 UNITS tablet, Take 2,000 Units by mouth daily. , Disp: , Rfl:  .  citalopram (CELEXA) 40 MG tablet, Take 1 tablet (40 mg total) by mouth daily., Disp: 90 tablet, Rfl: 3 .  cyclobenzaprine (FLEXERIL) 10 MG tablet, cyclobenzaprine 10 mg tablet, Disp: , Rfl:  .  EPINEPHrine (EPI-PEN) 0.3 mg/0.3 mL DEVI, Inject 0.3 mLs (0.3 mg total) into the muscle once. (Patient taking differently: Inject 0.3 mg into the muscle once. ), Disp: 1 Device, Rfl: 2 .  estradiol (ESTRACE) 2 MG tablet, Take 1 tablet by mouth daily. , Disp: , Rfl: 0 .  Fluticasone-Salmeterol (ADVAIR DISKUS) 100-50 MCG/DOSE AEPB, Inhale 1 puff into the lungs 2 (two) times daily., Disp: 60 each, Rfl: 11 .  furosemide (LASIX) 20 MG tablet, Take 1 tablet by mouth once daily, Disp: 90 tablet, Rfl: 0 .  levothyroxine (SYNTHROID, LEVOTHROID) 150 MCG tablet, Take 175  mcg by mouth daily before breakfast., Disp: , Rfl:  .  liothyronine (CYTOMEL) 5 MCG tablet, liothyronine 5 mcg tablet, Disp: , Rfl:  .  metoprolol tartrate (LOPRESSOR) 25 MG tablet, Take 1 tablet (25 mg total) by mouth 2 (two) times daily., Disp: 180 tablet, Rfl: 3 .  montelukast (SINGULAIR) 10 MG tablet, Take 10 mg by mouth every morning. , Disp: , Rfl:  .  ondansetron (ZOFRAN) 4 MG tablet, Take 1 tablet (4 mg total) by mouth every 8 (eight) hours as needed for nausea or vomiting., Disp: 30 tablet, Rfl: 0 .  pantoprazole (PROTONIX) 20 MG tablet, TAKE 1 TABLET BY MOUTH ONCE DAILY (TAKE  30  MINUTES  BEFORE  BREAKFAST), Disp: 90 tablet, Rfl: 0 .  SUMAtriptan (IMITREX) 50 MG tablet, Take 50 mg by mouth every 2 (two) hours as needed for  migraine. May repeat in 2 hours if headache persists or recurs., Disp: , Rfl:  .  traZODone (DESYREL) 50 MG tablet, TAKE 1/2 TO 1 (ONE-HALF TO ONE) TABLET BY MOUTH AT BEDTIME AS NEEDED FOR SLEEP, Disp: 30 tablet, Rfl: 0 .  Turmeric 500 MG CAPS, Take 1 capsule by mouth 2 (two) times daily., Disp: , Rfl:  .  vitamin B-12 (CYANOCOBALAMIN) 500 MCG tablet, Take 500 mcg by mouth daily., Disp: , Rfl:  .  carisoprodol (SOMA) 350 MG tablet, Take 1 tablet (350 mg total) by mouth at bedtime. (Patient not taking: Reported on 01/26/2019), Disp: 90 tablet, Rfl: 1 .  Zoster Vaccine Adjuvanted (SHINGRIX) injection, Shingrix (PF) 50 mcg/0.5 mL intramuscular suspension, kit, Disp: , Rfl:   Review of Systems  Constitutional: Negative for appetite change, chills, fatigue and fever.  Respiratory: Negative for chest tightness and shortness of breath.   Cardiovascular: Negative for chest pain and palpitations.  Gastrointestinal: Negative for abdominal pain, nausea and vomiting.  Neurological: Negative for dizziness and weakness.    Social History   Tobacco Use  . Smoking status: Former Smoker    Packs/day: 0.25    Types: Cigarettes    Quit date: 06/16/2004    Years since quitting: 14.6  . Smokeless tobacco: Never Used  . Tobacco comment: Socially-no longer  Substance Use Topics  . Alcohol use: Yes    Alcohol/week: 1.0 standard drinks    Types: 1 Cans of beer per week    Comment: Occasionally-wine and beer      Objective:   BP 100/68 (BP Location: Left Arm, Patient Position: Sitting, Cuff Size: Large)   Pulse (!) 58   Temp (!) 96.8 F (36 C) (Temporal)   Resp 16   SpO2 97%  Vitals:   02/01/19 0852  BP: 100/68  Pulse: (!) 58  Resp: 16  Temp: (!) 96.8 F (36 C)  TempSrc: Temporal  SpO2: 97%     Physical Exam Constitutional:      Appearance: Normal appearance.  Cardiovascular:     Rate and Rhythm: Normal rate and regular rhythm.     Heart sounds: Normal heart sounds.  Pulmonary:      Effort: Pulmonary effort is normal.     Breath sounds: Normal breath sounds.  Abdominal:    Musculoskeletal:       Legs:  Neurological:     Mental Status: She is alert.      No results found for any visits on 02/01/19.     Assessment & Plan    1. Fall, initial encounter  Xray as below. Counseled about pain management and it being important that  she takes full deep breaths. Lungs sound clear today.   - DG Ribs Unilateral Right; Future - DG Chest 2 View; Future - DG Hip Unilat W OR W/O Pelvis 2-3 Views Right; Future  2. Right hip pain  - DG Hip Unilat W OR W/O Pelvis 2-3 Views Right; Future  3. Rib pain on right side  - DG Ribs Unilateral Right; Future - DG Chest 2 View; Future  The entirety of the information documented in the History of Present Illness, Review of Systems and Physical Exam were personally obtained by me. Portions of this information were initially documented by April M. Sabra Heck, CMA and reviewed by me for thoroughness and accuracy.   F/u PRN    Trinna Post, PA-C  [facility]

## 2019-02-02 ENCOUNTER — Encounter: Payer: Self-pay | Admitting: Family Medicine

## 2019-02-02 ENCOUNTER — Ambulatory Visit (INDEPENDENT_AMBULATORY_CARE_PROVIDER_SITE_OTHER): Payer: Medicare Other | Admitting: Family Medicine

## 2019-02-02 VITALS — BP 134/72 | HR 85 | Temp 96.8°F | Wt 240.0 lb

## 2019-02-02 DIAGNOSIS — N182 Chronic kidney disease, stage 2 (mild): Secondary | ICD-10-CM | POA: Diagnosis not present

## 2019-02-02 DIAGNOSIS — E211 Secondary hyperparathyroidism, not elsewhere classified: Secondary | ICD-10-CM

## 2019-02-02 DIAGNOSIS — I10 Essential (primary) hypertension: Secondary | ICD-10-CM

## 2019-02-02 DIAGNOSIS — E890 Postprocedural hypothyroidism: Secondary | ICD-10-CM | POA: Diagnosis not present

## 2019-02-02 DIAGNOSIS — Z9884 Bariatric surgery status: Secondary | ICD-10-CM | POA: Diagnosis not present

## 2019-02-02 NOTE — Assessment & Plan Note (Signed)
Discussed importance of healthy weight management Discussed diet and exercise  

## 2019-02-02 NOTE — Progress Notes (Signed)
Patient: Claire Strong Female    DOB: [birth year]   58 y.o.   MRN: [id number] Visit Date: 02/02/2019  Today's Provider: Lavon Paganini, MD   Chief Complaint  Patient presents with  . Follow-up   Subjective:    I, Porsha McClurkin CMA, am acting as a scribe for Lavon Paganini, MD.   HPI Patient presents today for follow-up for blood work, fall & x-ray she had done on 02/01/2019. Patient states that her X-ray showed she had too much calcium. Patient states she is still having a little side pain.  Continues to struggle with weight loss  Followed with ENT for hypothyroidism and 2ndary hyperparathyroidism Taking Synthroid with good compliance  HTN: - Medications: metoprolol '25mg'$  BID, benazepril '10mg'$  daily - Compliance: good - Checking BP at home: no - Denies any SOB, CP, vision changes, LE edema, medication SEs, or symptoms of hypotension - Diet: low sodium - Exercise: intermittent walking   Allergies  Allergen Reactions  . Sulfa Antibiotics Shortness Of Breath  . Sulfur Anaphylaxis  . Cefuroxime Axetil Other (See Comments)    Bleeding ulcer  . Nutritional Supplements Swelling    Unsure which  . Other     All narcotics   . Oxycodone-Acetaminophen Nausea And Vomiting  . Betadine [Povidone Iodine] Rash     Current Outpatient Medications:  .  acetaminophen (TYLENOL) 500 MG tablet, Take 1,000 mg by mouth 2 (two) times daily. , Disp: , Rfl:  .  ACETAMINOPHEN-BUTALBITAL 50-325 MG TABS, Take 1 tablet by mouth at bedtime as needed (sleep). Reported on 09/03/2015 3 times a week, Disp: , Rfl:  .  albuterol (PROVENTIL HFA;VENTOLIN HFA) 108 (90 Base) MCG/ACT inhaler, Inhale 2 puffs into the lungs every 6 (six) hours as needed for wheezing or shortness of breath., Disp: 1 Inhaler, Rfl: 3 .  aspirin 81 MG tablet, Take 81 mg by mouth daily., Disp: , Rfl:  .  benazepril (LOTENSIN) 10 MG tablet, Take 1 tablet (10 mg total) by mouth daily., Disp: 90 tablet, Rfl: 3 .  bisacodyl  (DULCOLAX) 5 MG EC tablet, Take 5 mg by mouth daily as needed for moderate constipation., Disp: , Rfl:  .  buPROPion (WELLBUTRIN SR) 150 MG 12 hr tablet, Take 1 tablet (150 mg total) by mouth 2 (two) times daily., Disp: 180 tablet, Rfl: 3 .  calcium carbonate (CALCIUM 600) 600 MG TABS tablet, Take 1 tablet (600 mg total) 2 (two) times daily with a meal by mouth., Disp: 60 tablet, Rfl:  .  carisoprodol (SOMA) 350 MG tablet, Take 1 tablet (350 mg total) by mouth at bedtime., Disp: 90 tablet, Rfl: 1 .  cetirizine (ZYRTEC) 10 MG tablet, Take 10 mg by mouth daily., Disp: , Rfl:  .  Cholecalciferol (VITAMIN D3) 1000 UNITS tablet, Take 2,000 Units by mouth daily. , Disp: , Rfl:  .  citalopram (CELEXA) 40 MG tablet, Take 1 tablet (40 mg total) by mouth daily., Disp: 90 tablet, Rfl: 3 .  cyclobenzaprine (FLEXERIL) 10 MG tablet, cyclobenzaprine 10 mg tablet, Disp: , Rfl:  .  EPINEPHrine (EPI-PEN) 0.3 mg/0.3 mL DEVI, Inject 0.3 mLs (0.3 mg total) into the muscle once. (Patient taking differently: Inject 0.3 mg into the muscle once. ), Disp: 1 Device, Rfl: 2 .  estradiol (ESTRACE) 2 MG tablet, Take 1 tablet by mouth daily. , Disp: , Rfl: 0 .  Fluticasone-Salmeterol (ADVAIR DISKUS) 100-50 MCG/DOSE AEPB, Inhale 1 puff into the lungs 2 (two) times daily., Disp: 60  each, Rfl: 11 .  furosemide (LASIX) 20 MG tablet, Take 1 tablet by mouth once daily, Disp: 90 tablet, Rfl: 0 .  levothyroxine (SYNTHROID, LEVOTHROID) 150 MCG tablet, Take 175 mcg by mouth daily before breakfast., Disp: , Rfl:  .  liothyronine (CYTOMEL) 5 MCG tablet, liothyronine 5 mcg tablet, Disp: , Rfl:  .  metoprolol tartrate (LOPRESSOR) 25 MG tablet, Take 1 tablet (25 mg total) by mouth 2 (two) times daily., Disp: 180 tablet, Rfl: 3 .  montelukast (SINGULAIR) 10 MG tablet, Take 10 mg by mouth every morning. , Disp: , Rfl:  .  ondansetron (ZOFRAN) 4 MG tablet, Take 1 tablet (4 mg total) by mouth every 8 (eight) hours as needed for nausea or vomiting.,  Disp: 30 tablet, Rfl: 0 .  pantoprazole (PROTONIX) 20 MG tablet, TAKE 1 TABLET BY MOUTH ONCE DAILY (TAKE  30  MINUTES  BEFORE  BREAKFAST), Disp: 90 tablet, Rfl: 0 .  SUMAtriptan (IMITREX) 50 MG tablet, Take 50 mg by mouth every 2 (two) hours as needed for migraine. May repeat in 2 hours if headache persists or recurs., Disp: , Rfl:  .  traZODone (DESYREL) 50 MG tablet, TAKE 1/2 TO 1 (ONE-HALF TO ONE) TABLET BY MOUTH AT BEDTIME AS NEEDED FOR SLEEP, Disp: 30 tablet, Rfl: 0 .  Turmeric 500 MG CAPS, Take 1 capsule by mouth 2 (two) times daily., Disp: , Rfl:  .  vitamin B-12 (CYANOCOBALAMIN) 500 MCG tablet, Take 500 mcg by mouth daily., Disp: , Rfl:  .  Zoster Vaccine Adjuvanted (SHINGRIX) injection, Shingrix (PF) 50 mcg/0.5 mL intramuscular suspension, kit, Disp: , Rfl:   Review of Systems  Constitutional: Negative.   Respiratory: Negative.   Cardiovascular: Negative.   Genitourinary: Negative.   Musculoskeletal: Negative.   Neurological: Negative.     Social History   Tobacco Use  . Smoking status: Former Smoker    Packs/day: 0.25    Types: Cigarettes    Quit date: 06/16/2004    Years since quitting: 14.6  . Smokeless tobacco: Never Used  . Tobacco comment: Socially-no longer  Substance Use Topics  . Alcohol use: Yes    Alcohol/week: 1.0 standard drinks    Types: 1 Cans of beer per week    Comment: Occasionally-wine and beer      Objective:   BP 134/72 (BP Location: Left Arm, Patient Position: Sitting, Cuff Size: Normal)   Pulse 85   Temp (!) 96.8 F (36 C) (Temporal)   Wt 240 lb (108.9 kg)   SpO2 98%   BMI 36.49 kg/m  Vitals:   02/02/19 0844  BP: 134/72  Pulse: 85  Temp: (!) 96.8 F (36 C)  TempSrc: Temporal  SpO2: 98%  Weight: 240 lb (108.9 kg)     Physical Exam Vitals signs reviewed.  Constitutional:      General: She is not in acute distress.    Appearance: Normal appearance. She is well-developed. She is not diaphoretic.  HENT:     Head: Normocephalic and  atraumatic.  Eyes:     General: No scleral icterus.    Conjunctiva/sclera: Conjunctivae normal.  Neck:     Musculoskeletal: Neck supple.     Thyroid: No thyromegaly.  Cardiovascular:     Rate and Rhythm: Normal rate and regular rhythm.     Pulses: Normal pulses.     Heart sounds: Normal heart sounds. No murmur.  Pulmonary:     Effort: Pulmonary effort is normal. No respiratory distress.     Breath sounds:  Normal breath sounds. No wheezing, rhonchi or rales.  Musculoskeletal:     Right lower leg: No edema.     Left lower leg: No edema.  Lymphadenopathy:     Cervical: No cervical adenopathy.  Skin:    General: Skin is warm and dry.     Capillary Refill: Capillary refill takes less than 2 seconds.     Findings: No rash.  Neurological:     Mental Status: She is alert and oriented to person, place, and time. Mental status is at baseline.  Psychiatric:        Mood and Affect: Mood normal.        Behavior: Behavior normal.      No results found for any visits on 02/02/19.     Assessment & Plan   Problem List Items Addressed This Visit      Cardiovascular and Mediastinum   HTN (hypertension) - Primary    Well controlled Continue current medications Recheck metabolic panel F/u in 6 months       Relevant Orders   Lipid panel   Comprehensive metabolic panel     Endocrine   Hypothyroidism, postsurgical    Followed by ENT (Dr Ayesha Mohair) Last TSH wnl Continue Synthroid      Secondary hyperparathyroidism, non-renal ([place])    Patient was previously worked up for this and found to have normal calcium and vitamin D levels It was suspected that this was related to her low calcium intake and she started calcium supplements twice daily She had a DEXA scan that was within normal limits in 07/2017        Genitourinary   CKD (chronic kidney disease) stage 2, 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   The entirety of the information documented in the History of Present Illness, Review of Systems and Physical Exam were personally obtained by me. Portions of this information were initially documented by [facility] CMA and reviewed by me for thoroughness and accuracy.    Aryannah Mohon, ADionne Bucy MD MPH [facility]

## 2019-02-02 NOTE — Assessment & Plan Note (Signed)
Well controlled Continue current medications Recheck metabolic panel F/u in 6 months  

## 2019-02-02 NOTE — Assessment & Plan Note (Signed)
Patient was previously worked up for this and found to have normal calcium and vitamin D levels It was suspected that this was related to her low calcium intake and she started calcium supplements twice daily She had a DEXA scan that was within normal limits in 07/2017

## 2019-02-02 NOTE — Assessment & Plan Note (Signed)
Followed by ENT (Dr Delfin Edis) Last TSH wnl Continue Synthroid

## 2019-02-02 NOTE — Assessment & Plan Note (Signed)
Encouraged diet and exercise.  

## 2019-02-02 NOTE — Assessment & Plan Note (Signed)
2/2 congenital horseshoe kidney Avoid nephrotoxic meds Continue to monitor Cr q44m

## 2019-02-03 ENCOUNTER — Telehealth: Payer: Self-pay

## 2019-02-03 DIAGNOSIS — D2372 Other benign neoplasm of skin of left lower limb, including hip: Secondary | ICD-10-CM | POA: Diagnosis not present

## 2019-02-03 DIAGNOSIS — M79671 Pain in right foot: Secondary | ICD-10-CM | POA: Diagnosis not present

## 2019-02-03 DIAGNOSIS — M79672 Pain in left foot: Secondary | ICD-10-CM | POA: Diagnosis not present

## 2019-02-03 LAB — COMPREHENSIVE METABOLIC PANEL
ALT: 15 IU/L (ref 0–32)
AST: 18 IU/L (ref 0–40)
Albumin/Globulin Ratio: 2 (ref 1.2–2.2)
Albumin: 3.9 g/dL (ref 3.8–4.9)
Alkaline Phosphatase: 75 IU/L (ref 39–117)
BUN/Creatinine Ratio: 13 (ref 9–23)
BUN: 11 mg/dL (ref 6–24)
Bilirubin Total: 0.3 mg/dL (ref 0.0–1.2)
CO2: 23 mmol/L (ref 20–29)
Calcium: 8.3 mg/dL — ABNORMAL LOW (ref 8.7–10.2)
Chloride: 105 mmol/L (ref 96–106)
Creatinine, Ser: 0.86 mg/dL (ref 0.57–1.00)
GFR calc Af Amer: 87 mL/min/{1.73_m2} (ref >59)
GFR calc non Af Amer: 75 mL/min/{1.73_m2} (ref >59)
Globulin, Total: 2 g/dL (ref 1.5–4.5)
Glucose: 84 mg/dL (ref 65–99)
Potassium: 4.3 mmol/L (ref 3.5–5.2)
Sodium: 140 mmol/L (ref 134–144)
Total Protein: 5.9 g/dL — ABNORMAL LOW (ref 6.0–8.5)

## 2019-02-03 LAB — LIPID PANEL
Chol/HDL Ratio: 3.2 ratio (ref 0.0–4.4)
Cholesterol, Total: 162 mg/dL (ref 100–199)
HDL: 51 mg/dL (ref >39)
LDL Calculated: 77 mg/dL (ref 0–99)
Triglycerides: 171 mg/dL — ABNORMAL HIGH (ref 0–149)
VLDL Cholesterol Cal: 34 mg/dL (ref 5–40)

## 2019-02-03 NOTE — Telephone Encounter (Signed)
OK to increase to 1200mg  daily

## 2019-02-03 NOTE — Telephone Encounter (Signed)
Patient was advised. Patient states that she takes Calcium 600 MG daily.FYI

## 2019-02-03 NOTE — Telephone Encounter (Signed)
lmtcb

## 2019-02-03 NOTE — Telephone Encounter (Signed)
-----   Message from [name], MD sent at 02/03/2019 11:25 AM EDT ----- Normal labs, except calcium level is low. May want to start a supplement.

## 2019-02-04 NOTE — Telephone Encounter (Signed)
Patient advised as below.  

## 2019-02-06 ENCOUNTER — Other Ambulatory Visit: Payer: Self-pay | Admitting: Family Medicine

## 2019-02-10 ENCOUNTER — Telehealth: Payer: Self-pay

## 2019-02-10 NOTE — Telephone Encounter (Signed)
Patient is scheduled with [facility] for a esophageal manomety on 03/01/2019.

## 2019-02-22 ENCOUNTER — Ambulatory Visit: Payer: Medicare Other

## 2019-02-23 ENCOUNTER — Encounter: Payer: Self-pay | Admitting: Family Medicine

## 2019-02-26 DIAGNOSIS — Z01812 Encounter for preprocedural laboratory examination: Secondary | ICD-10-CM | POA: Diagnosis not present

## 2019-03-01 DIAGNOSIS — R1310 Dysphagia, unspecified: Secondary | ICD-10-CM | POA: Diagnosis not present

## 2019-03-01 DIAGNOSIS — K228 Other specified diseases of esophagus: Secondary | ICD-10-CM | POA: Diagnosis not present

## 2019-03-01 DIAGNOSIS — R1314 Dysphagia, pharyngoesophageal phase: Secondary | ICD-10-CM | POA: Diagnosis not present

## 2019-03-03 ENCOUNTER — Telehealth: Payer: Self-pay

## 2019-03-03 NOTE — Telephone Encounter (Signed)
Patient verbalized understanding made appointment for Monday

## 2019-03-03 NOTE — Telephone Encounter (Signed)
-----   Message from Pasty Spillers, MD sent at 03/03/2019  4:03 PM EDT ----- Please let the patient know her manometry study reported a hypotensive upper esophageal sphincter, but was otherwise normal.  This means that this study did not reveal any causes for her dysphagia.  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  However, I would like to see her in clinic to discuss any ongoing symptoms.

## 2019-03-04 ENCOUNTER — Other Ambulatory Visit: Payer: Self-pay

## 2019-03-07 ENCOUNTER — Other Ambulatory Visit: Payer: Self-pay

## 2019-03-07 ENCOUNTER — Encounter: Payer: Self-pay | Admitting: Gastroenterology

## 2019-03-07 ENCOUNTER — Ambulatory Visit (INDEPENDENT_AMBULATORY_CARE_PROVIDER_SITE_OTHER): Payer: Medicare Other | Admitting: Gastroenterology

## 2019-03-07 VITALS — BP 117/76 | HR 58 | Temp 98.2°F | Wt 246.2 lb

## 2019-03-07 DIAGNOSIS — R1310 Dysphagia, unspecified: Secondary | ICD-10-CM

## 2019-03-07 MED ORDER — PANTOPRAZOLE SODIUM 20 MG PO TBEC: 20.0000 mg | DELAYED_RELEASE_TABLET | Freq: Two times a day (BID) | ORAL | 0 refills | Status: DC

## 2019-03-07 NOTE — Progress Notes (Signed)
[name], MD [address]  [address]  [place], [place] [address]  Main: [phone]  Fax: [phone]   Primary Care Physician: [name], MD   Chief Complaint  Patient presents with  . Results    discusss test results     HPI: Claire Strong is a 58 y.o. female here for follow-up of dysphagia.  Manometry done at [facility] was normal except for a hypotensive UES.  Patient reports continued intermittent symptoms with foods like steak, feeling like they stop in her throat, but she is able to swallow them down.  No episodes of food impaction.  Attributes her symptoms done to thyroid surgery done 4 years ago.  States symptoms started after that.  No abdominal pain.  No weight loss.  Reports since starting Protonix her reflux symptoms are very well controlled and if she misses a dose she has significant symptoms.  However, it has not helped her with her intermittent dysphagia symptoms.  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 Also has iron deficiency anemia. No episodes of food impaction. Patient underwent EGD and colonoscopy. Colonoscopy showed a fair prep, with 3 subcentimeter polyps removed. Diverticulosis reported. Repeat recommended in 3 years due to fair prep. 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 Normal stomach. Roux-en-Y anatomy with healthy-appearing mucosa. Biopsies were not consistent with eosinophilic esophagitis. Colon polyp showed tubular adenoma.   Current Outpatient Medications  Medication Sig Dispense Refill  . acetaminophen (TYLENOL) 500 MG tablet Take 1,000 mg by mouth 2 (two) times daily.     . ACETAMINOPHEN-BUTALBITAL 50-325 MG TABS Take 1 tablet by mouth at bedtime as needed (sleep). Reported on 09/03/2015 3 times a week    . albuterol (PROVENTIL HFA;VENTOLIN HFA) 108 (90 Base) MCG/ACT inhaler Inhale 2 puffs into the lungs every 6 (six) hours as needed for wheezing or  shortness of breath. 1 Inhaler 3  . aspirin 81 MG tablet Take 81 mg by mouth daily.    . benazepril (LOTENSIN) 10 MG tablet Take 1 tablet (10 mg total) by mouth daily. 90 tablet 3  . bisacodyl (DULCOLAX) 5 MG EC tablet Take 5 mg by mouth daily as needed for moderate constipation.    Marland Kitchen buPROPion (WELLBUTRIN SR) 150 MG 12 hr tablet Take 1 tablet (150 mg total) by mouth 2 (two) times daily. 180 tablet 3  . calcium carbonate (CALCIUM 600) 600 MG TABS tablet Take 1 tablet (600 mg total) 2 (two) times daily with a meal by mouth. 60 tablet   . cetirizine (ZYRTEC) 10 MG tablet Take 10 mg by mouth daily.    . Cholecalciferol (VITAMIN D3) 1000 UNITS tablet Take 2,000 Units by mouth daily.     . citalopram (CELEXA) 40 MG tablet Take 1 tablet (40 mg total) by mouth daily. 90 tablet 3  . cyclobenzaprine (FLEXERIL) 10 MG tablet cyclobenzaprine 10 mg tablet    . EPINEPHrine (EPI-PEN) 0.3 mg/0.3 mL DEVI Inject 0.3 mLs (0.3 mg total) into the muscle once. (Patient taking differently: Inject 0.3 mg into the muscle once. ) 1 Device 2  . estradiol (ESTRACE) 2 MG tablet Take 1 tablet by mouth daily.   0  . Fluticasone-Salmeterol (ADVAIR DISKUS) 100-50 MCG/DOSE AEPB Inhale 1 puff into the lungs 2 (two) times daily. 60 each 11  . furosemide (LASIX) 20 MG tablet Take 1 tablet by mouth once daily 90 tablet 0  . levothyroxine (SYNTHROID, LEVOTHROID) 150 MCG tablet Take 175 mcg  by mouth daily before breakfast.    . liothyronine (CYTOMEL) 5 MCG tablet liothyronine 5 mcg tablet    . metoprolol tartrate (LOPRESSOR) 25 MG tablet Take 1 tablet (25 mg total) by mouth 2 (two) times daily. 180 tablet 3  . montelukast (SINGULAIR) 10 MG tablet Take 10 mg by mouth every morning.     . ondansetron (ZOFRAN) 4 MG tablet Take 1 tablet (4 mg total) by mouth every 8 (eight) hours as needed for nausea or vomiting. 30 tablet 0  . SUMAtriptan (IMITREX) 50 MG tablet Take 50 mg by mouth every 2 (two) hours as needed for migraine. May repeat in 2  hours if headache persists or recurs.    . traZODone (DESYREL) 50 MG tablet TAKE 1/2 TO 1 (ONE-HALF TO ONE) TABLET BY MOUTH AT BEDTIME AS NEEDED FOR SLEEP 90 tablet 1  . Turmeric 500 MG CAPS Take 1 capsule by mouth 2 (two) times daily.    . vitamin B-12 (CYANOCOBALAMIN) 500 MCG tablet Take 500 mcg by mouth daily.    . carisoprodol (SOMA) 350 MG tablet Take 1 tablet (350 mg total) by mouth at bedtime. (Patient not taking: Reported on 03/07/2019) 90 tablet 1  . pantoprazole (PROTONIX) 20 MG tablet Take 1 tablet (20 mg total) by mouth 2 (two) times daily. 60 tablet 0   No current facility-administered medications for this visit.     Allergies as of 03/07/2019 - Review Complete 03/07/2019  Allergen Reaction Noted  . Sulfa antibiotics Shortness Of Breath 12/19/2010  . Sulfur Anaphylaxis 07/01/2018  . Cefuroxime axetil Other (See Comments) 09/03/2015  . Nutritional supplements Swelling 12/19/2010  . Other  03/19/2017  . Oxycodone-acetaminophen Nausea And Vomiting 09/03/2015  . Betadine [povidone iodine] Rash 03/08/2016    ROS:  General: Negative for anorexia, weight loss, fever, chills, fatigue, weakness. ENT: Negative for hoarseness, difficulty swallowing , nasal congestion. CV: Negative for chest pain, angina, palpitations, dyspnea on exertion, peripheral edema.  Respiratory: Negative for dyspnea at rest, dyspnea on exertion, cough, sputum, wheezing.  GI: See history of present illness. GU:  Negative for dysuria, hematuria, urinary incontinence, urinary frequency, nocturnal urination.  Endo: Negative for unusual weight change.    Physical Examination:   BP 117/76 (BP Location: Left Arm, Patient Position: Sitting, Cuff Size: Normal)   Pulse (!) 58   Temp 98.2 F (36.8 C) (Oral)   Wt 246 lb 4 oz (111.7 kg)   BMI 37.44 kg/m   General: Well-nourished, well-developed in no acute distress.  Eyes: No icterus. Conjunctivae pink. Mouth: Oropharyngeal mucosa moist and pink , no lesions  erythema or exudate. Neck: Supple, Trachea midline Abdomen: Bowel sounds are normal, nontender, nondistended, no hepatosplenomegaly or masses, no abdominal bruits or hernia , no rebound or guarding.   Extremities: No lower extremity edema. No clubbing or deformities. Neuro: Alert and oriented x 3.  Grossly intact. Skin: Warm and dry, no jaundice.   Psych: Alert and cooperative, normal mood and affect.   Labs: CMP     Component Value Date/Time   NA 140 02/02/2019 0919   NA 132 (L) 08/27/2013 0616   K 4.3 02/02/2019 0919   K 4.5 08/27/2013 0616   CL 105 02/02/2019 0919   CL 102 08/27/2013 0616   CO2 23 02/02/2019 0919   CO2 27 08/27/2013 0616   GLUCOSE 84 02/02/2019 0919   GLUCOSE 93 06/01/2018 1511   GLUCOSE 107 (H) 08/27/2013 0616   BUN 11 02/02/2019 0919   BUN 12 08/27/2013 8085  CREATININE 0.86 02/02/2019 0919   CREATININE 0.91 08/27/2013 0616   CREATININE 1.02 09/20/2011   CALCIUM 8.3 (L) 02/02/2019 0919   CALCIUM 7.4 (L) 08/27/2013 0616   CALCIUM 7.7 09/20/2011   PROT 5.9 (L) 02/02/2019 0919   PROT 6.4 09/20/2011 1746   ALBUMIN 3.9 02/02/2019 0919   ALBUMIN 3.5 09/20/2011 1746   AST 18 02/02/2019 0919   AST 41 (H) 09/20/2011 1746   AST 41 09/20/2011   ALT 15 02/02/2019 0919   ALT 68 09/20/2011 1746   ALKPHOS 75 02/02/2019 0919   ALKPHOS 127 09/20/2011 1746   ALKPHOS 127 09/20/2011   BILITOT 0.3 02/02/2019 0919   BILITOT 0.4 09/20/2011 1746   BILITOT 0.4 09/20/2011   GFRNONAA 75 02/02/2019 0919   GFRNONAA >60 08/27/2013 0616   GFRAA 87 02/02/2019 0919   GFRAA >60 08/27/2013 0616   Lab Results  Component Value Date   WBC 5.4 12/06/2018   HGB 13.8 12/06/2018   HCT 40.1 12/06/2018   MCV 92.6 12/06/2018   PLT 194 12/06/2018    Imaging Studies: No results found.  Assessment and Plan:   YELITZA REACH is a 58 y.o. y/o female here for follow-up of dysphagia with manometry showing hypotensive UES  UES abnormalities are not well defined in the literature  and some studies have suggested this to be an incidental finding.  A recent study looked at UES abnormalities in the setting of achalasia and treatment response to achalasia.  Achalasia was not found in this patient.  I have discussed the results with her and that the finding may be nonspecific and not attributing to her symptoms per se.  However, I have discussed that we can refer her to a esophageal motility specialist at [facility] or [facility] to obtain their official opinion on this.  However, she is satisfied with her symptoms and is not having any weight loss and is able to eat foods that she likes without any episodes of food impactions and would not like a referral at this time.  If she changes her mind she will let us know.  We will attempt a trial of PPI twice daily to see if it helps with her symptoms, for 4 weeks  (Risks of PPI use were discussed with patient including bone loss, C. Diff diarrhea, pneumonia, infections, CKD, electrolyte abnormalities. Pt. Verbalizes understanding and chooses to continue the medication.)   Dr [name]

## 2019-03-10 ENCOUNTER — Encounter: Payer: Self-pay | Admitting: Student

## 2019-04-18 ENCOUNTER — Other Ambulatory Visit: Payer: Self-pay | Admitting: Family Medicine

## 2019-04-20 ENCOUNTER — Encounter: Payer: Self-pay | Admitting: Family Medicine

## 2019-04-20 ENCOUNTER — Other Ambulatory Visit: Payer: Self-pay | Admitting: Gastroenterology

## 2019-04-28 ENCOUNTER — Telehealth: Payer: Self-pay | Admitting: Family Medicine

## 2019-04-28 ENCOUNTER — Encounter: Payer: Self-pay | Admitting: Family Medicine

## 2019-04-29 ENCOUNTER — Other Ambulatory Visit
Admission: RE | Admit: 2019-04-29 | Discharge: 2019-04-29 | Disposition: A | Discharge status: Home / Self Care | Payer: Medicare Other | Attending: Otolaryngology | Admitting: Otolaryngology

## 2019-04-29 ENCOUNTER — Telehealth: Payer: Self-pay | Admitting: *Deleted

## 2019-04-29 ENCOUNTER — Encounter: Payer: Self-pay | Admitting: Family Medicine

## 2019-04-29 ENCOUNTER — Ambulatory Visit (INDEPENDENT_AMBULATORY_CARE_PROVIDER_SITE_OTHER): Payer: Medicare Other | Admitting: Family Medicine

## 2019-04-29 ENCOUNTER — Other Ambulatory Visit: Payer: Self-pay

## 2019-04-29 VITALS — Temp 97.7°F | Wt 236.0 lb

## 2019-04-29 DIAGNOSIS — R05 Cough: Secondary | ICD-10-CM

## 2019-04-29 DIAGNOSIS — R059 Cough, unspecified: Secondary | ICD-10-CM

## 2019-04-29 DIAGNOSIS — R062 Wheezing: Secondary | ICD-10-CM

## 2019-04-29 DIAGNOSIS — Z03818 Encounter for observation for suspected exposure to other biological agents ruled out: Secondary | ICD-10-CM | POA: Diagnosis not present

## 2019-04-29 DIAGNOSIS — E890 Postprocedural hypothyroidism: Secondary | ICD-10-CM | POA: Diagnosis not present

## 2019-04-29 DIAGNOSIS — J40 Bronchitis, not specified as acute or chronic: Secondary | ICD-10-CM | POA: Diagnosis not present

## 2019-04-29 LAB — T4, FREE: Free T4: 0.85 ng/dL (ref 0.61–1.12)

## 2019-04-29 LAB — TSH: TSH: 0.663 u[IU]/mL (ref 0.350–4.500)

## 2019-04-29 MED ORDER — PREDNISONE 20 MG PO TABS: 40.0000 mg | ORAL_TABLET | Freq: Every day | ORAL | 0 refills | Status: AC

## 2019-04-29 MED ORDER — FLUTICASONE-SALMETEROL 100-50 MCG/DOSE IN AEPB: 1.0000 | INHALATION_SPRAY | Freq: Two times a day (BID) | RESPIRATORY_TRACT | 11 refills | Status: DC

## 2019-04-29 NOTE — Patient Instructions (Signed)
This information is directly available on the CDC website: https://www.cdc.gov/coronavirus/2019-ncov/if-you-are-sick/steps-when-sick.html    Source:CDC Reference to specific commercial products, manufacturers, companies, or trademarks does not constitute its endorsement or recommendation by the U.S. Government, Department of Health and Human Services, or Centers for Disease Control and Prevention.  

## 2019-04-29 NOTE — Progress Notes (Deleted)
duplicate

## 2019-04-29 NOTE — Telephone Encounter (Signed)
Noted  

## 2019-04-29 NOTE — Progress Notes (Signed)
Patient: Claire Strong Female    DOB: [birth year]   58 y.o.   MRN: [id number] Visit Date: 04/29/2019  Today's Provider: Shirlee Latch, MD   No chief complaint on file.  Subjective:    Virtual Visit via Telephone Note  I connected with Claire Strong on 04/29/19 at  8:00 AM EST by telephone and verified that I am speaking with the correct person using two identifiers.   Patient location: home Provider location: [facility] Persons involved in the visit: patient, provider   I discussed the limitations, risks, security and privacy concerns of performing an evaluation and management service by telephone and the availability of in person appointments. I also discussed with the patient that there may be a patient responsible charge related to this service. The patient expressed understanding and agreed to proceed.   HPI Patient C/O cough x's 5 days. Patient reports wheezing and denies any shortness of breath. Patient reports post nasal drip all the time. Patient reports she usually gets bronchitis when her sinus start to drain.  She has not tried anything for her symptoms. She has run out of Advair.  Chest hurts from coughing and now hurts to take a deep breath. No fever.  Husband had URI symptoms earlier in the week and was tested for COVID (negative)  Allergies  Allergen Reactions  . Sulfa Antibiotics Shortness Of Breath  . Sulfur Anaphylaxis  . Cefuroxime Axetil Other (See Comments)    Bleeding ulcer  . Nutritional Supplements Swelling    Unsure which  . Other     All narcotics   . Oxycodone-Acetaminophen Nausea And Vomiting  . Betadine [Povidone Iodine] Rash     Current Outpatient Medications:  .  acetaminophen (TYLENOL) 500 MG tablet, Take 1,000 mg by mouth 2 (two) times daily. , Disp: , Rfl:  .  ACETAMINOPHEN-BUTALBITAL 50-325 MG TABS, Take 1 tablet by mouth at bedtime as needed (sleep). Reported on 09/03/2015 3 times a week, Disp: , Rfl:  .   albuterol (PROVENTIL HFA;VENTOLIN HFA) 108 (90 Base) MCG/ACT inhaler, Inhale 2 puffs into the lungs every 6 (six) hours as needed for wheezing or shortness of breath., Disp: 1 Inhaler, Rfl: 3 .  aspirin 81 MG tablet, Take 81 mg by mouth daily., Disp: , Rfl:  .  benazepril (LOTENSIN) 10 MG tablet, Take 1 tablet (10 mg total) by mouth daily., Disp: 90 tablet, Rfl: 3 .  bisacodyl (DULCOLAX) 5 MG EC tablet, Take 5 mg by mouth daily as needed for moderate constipation., Disp: , Rfl:  .  buPROPion (WELLBUTRIN SR) 150 MG 12 hr tablet, Take 1 tablet (150 mg total) by mouth 2 (two) times daily. (Patient taking differently: Take 150 mg by mouth every other day. ), Disp: 180 tablet, Rfl: 3 .  calcium carbonate (CALCIUM 600) 600 MG TABS tablet, Take 1 tablet (600 mg total) 2 (two) times daily with a meal by mouth., Disp: 60 tablet, Rfl:  .  cetirizine (ZYRTEC) 10 MG tablet, Take 10 mg by mouth daily., Disp: , Rfl:  .  Cholecalciferol (VITAMIN D3) 1000 UNITS tablet, Take 2,000 Units by mouth daily. , Disp: , Rfl:  .  citalopram (CELEXA) 40 MG tablet, Take 1 tablet (40 mg total) by mouth daily., Disp: 90 tablet, Rfl: 3 .  cyclobenzaprine (FLEXERIL) 10 MG tablet, cyclobenzaprine 10 mg tablet, Disp: , Rfl:  .  EPINEPHrine (EPI-PEN) 0.3 mg/0.3 mL DEVI, Inject 0.3 mLs (0.3 mg total) into the muscle  once. (Patient taking differently: Inject 0.3 mg into the muscle once. ), Disp: 1 Device, Rfl: 2 .  estradiol (ESTRACE) 2 MG tablet, Take 1 tablet by mouth daily. , Disp: , Rfl: 0 .  Fluticasone-Salmeterol (ADVAIR DISKUS) 100-50 MCG/DOSE AEPB, Inhale 1 puff into the lungs 2 (two) times daily., Disp: 60 each, Rfl: 11 .  furosemide (LASIX) 20 MG tablet, Take 1 tablet by mouth once daily, Disp: 90 tablet, Rfl: 0 .  levothyroxine (SYNTHROID, LEVOTHROID) 150 MCG tablet, Take 175 mcg by mouth daily before breakfast., Disp: , Rfl:  .  liothyronine (CYTOMEL) 5 MCG tablet, liothyronine 5 mcg tablet, Disp: , Rfl:  .  metoprolol  tartrate (LOPRESSOR) 25 MG tablet, Take 1 tablet (25 mg total) by mouth 2 (two) times daily., Disp: 180 tablet, Rfl: 3 .  montelukast (SINGULAIR) 10 MG tablet, Take 10 mg by mouth every morning. , Disp: , Rfl:  .  ondansetron (ZOFRAN) 4 MG tablet, Take 1 tablet (4 mg total) by mouth every 8 (eight) hours as needed for nausea or vomiting., Disp: 30 tablet, Rfl: 0 .  pantoprazole (PROTONIX) 20 MG tablet, Take 1 tablet by mouth twice daily, Disp: 60 tablet, Rfl: 0 .  SUMAtriptan (IMITREX) 50 MG tablet, Take 50 mg by mouth every 2 (two) hours as needed for migraine. May repeat in 2 hours if headache persists or recurs., Disp: , Rfl:  .  traZODone (DESYREL) 50 MG tablet, TAKE 1/2 TO 1 (ONE-HALF TO ONE) TABLET BY MOUTH AT BEDTIME AS NEEDED FOR SLEEP, Disp: 90 tablet, Rfl: 1 .  Turmeric 500 MG CAPS, Take 1 capsule by mouth 2 (two) times daily., Disp: , Rfl:  .  vitamin B-12 (CYANOCOBALAMIN) 500 MCG tablet, Take 500 mcg by mouth daily., Disp: , Rfl:   Review of Systems  Constitutional: Negative for chills and fever.  HENT: Positive for congestion and postnasal drip.   Respiratory: Positive for cough and wheezing.     Social History   Tobacco Use  . Smoking status: Former Smoker    Packs/day: 0.25    Types: Cigarettes    Quit date: 06/16/2004    Years since quitting: 14.8  . Smokeless tobacco: Never Used  . Tobacco comment: Socially-no longer  Substance Use Topics  . Alcohol use: Yes    Alcohol/week: 1.0 standard drinks    Types: 1 Cans of beer per week    Comment: Occasionally-wine and beer      Objective:   Temp 97.7 F (36.5 C) (Temporal)   Wt 236 lb (107 kg)   BMI 35.88 kg/m  Vitals:   04/29/19 0807  Temp: 97.7 F (36.5 C)  TempSrc: Temporal  Weight: 236 lb (107 kg)  Body mass index is 35.88 kg/m.   Physical Exam Speaking in full sentences in NAD, intermittent coughing  No results found for any visits on 04/29/19.     Assessment & Plan      I discussed the assessment  and treatment plan with the patient. The patient was provided an opportunity to ask questions and all were answered. The patient agreed with the plan and demonstrated an understanding of the instructions.   The patient was advised to call back or seek an in-person evaluation if the symptoms worsen or if the condition fails to improve as anticipated.  1. Cough 2. Wheeze -Patient sounds stable on the phone, but with husband with similar symptoms earlier this week and new onset cough and wheezing, concern for possible COVID-19 infection -Could also  have asthma exacerbation, so we will start 7-day prednisone burst -Resume Advair -We will send for outpatient COVID-19 testing -Discussed self-isolation for 10 days from the start of illness and until least 2 days fever free -Discussed natural course, symptomatic management, and return precautions -If not improving in the next few days, would obtain chest x-ray - Novel Coronavirus, NAA ([facility])   Meds ordered this encounter  Medications  . Fluticasone-Salmeterol (ADVAIR DISKUS) 100-50 MCG/DOSE AEPB    Sig: Inhale 1 puff into the lungs 2 (two) times daily.    Dispense:  60 each    Refill:  11  . predniSONE (DELTASONE) 20 MG tablet    Sig: Take 2 tablets (40 mg total) by mouth daily with breakfast for 7 days.    Dispense:  14 tablet    Refill:  0     Return if symptoms worsen or fail to improve.   The entirety of the information documented in the History of Present Illness, Review of Systems and Physical Exam were personally obtained by me. Portions of this information were initially documented by Rondel Baton, CMA and reviewed by me for thoroughness and accuracy.    Pharell Rolfson, Marzella Schlein, MD MPH [facility]

## 2019-04-29 NOTE — Telephone Encounter (Signed)
Patient called back to advise Dr. Leonard Schwartz she went to [facility] to be tested for coronavirus.

## 2019-04-30 ENCOUNTER — Encounter: Payer: Self-pay | Admitting: Family Medicine

## 2019-04-30 LAB — T3, FREE: T3, Free: 2.4 pg/mL (ref 2.0–4.4)

## 2019-05-02 ENCOUNTER — Ambulatory Visit: Payer: Self-pay | Admitting: Family Medicine

## 2019-05-05 DIAGNOSIS — E890 Postprocedural hypothyroidism: Secondary | ICD-10-CM | POA: Diagnosis not present

## 2019-05-19 ENCOUNTER — Other Ambulatory Visit: Payer: Self-pay | Admitting: Gastroenterology

## 2019-05-24 NOTE — Telephone Encounter (Signed)
Last office visit 03/07/2019 Dysphagia  Last refill 04/20/2019 0 refills  No appointment is scheduled

## 2019-06-06 ENCOUNTER — Other Ambulatory Visit: Payer: Self-pay

## 2019-06-06 ENCOUNTER — Inpatient Hospital Stay: Payer: Medicare Other | Attending: Oncology

## 2019-06-06 DIAGNOSIS — D508 Other iron deficiency anemias: Secondary | ICD-10-CM | POA: Diagnosis not present

## 2019-06-06 LAB — CBC WITH DIFFERENTIAL/PLATELET
Abs Immature Granulocytes: 0.02 10*3/uL (ref 0.00–0.07)
Basophils Absolute: 0 10*3/uL (ref 0.0–0.1)
Basophils Relative: 0 %
Eosinophils Absolute: 0.1 10*3/uL (ref 0.0–0.5)
Eosinophils Relative: 1 %
HCT: 41.5 % (ref 36.0–46.0)
Hemoglobin: 13.2 g/dL (ref 12.0–15.0)
Immature Granulocytes: 0 %
Lymphocytes Relative: 29 %
Lymphs Abs: 1.6 10*3/uL (ref 0.7–4.0)
MCH: 29.9 pg (ref 26.0–34.0)
MCHC: 31.8 g/dL (ref 30.0–36.0)
MCV: 94.1 fL (ref 80.0–100.0)
Monocytes Absolute: 0.3 10*3/uL (ref 0.1–1.0)
Monocytes Relative: 5 %
Neutro Abs: 3.6 10*3/uL (ref 1.7–7.7)
Neutrophils Relative %: 65 %
Platelets: 194 10*3/uL (ref 150–400)
RBC: 4.41 MIL/uL (ref 3.87–5.11)
RDW: 11.5 % (ref 11.5–15.5)
WBC: 5.6 10*3/uL (ref 4.0–10.5)
nRBC: 0 % (ref 0.0–0.2)

## 2019-06-06 LAB — IRON AND TIBC
Iron: 93 ug/dL (ref 28–170)
Saturation Ratios: 29 % (ref 10.4–31.8)
TIBC: 319 ug/dL (ref 250–450)
UIBC: 226 ug/dL

## 2019-06-06 LAB — FERRITIN: Ferritin: 63 ng/mL (ref 11–307)

## 2019-06-07 ENCOUNTER — Other Ambulatory Visit: Payer: Self-pay

## 2019-06-07 NOTE — Progress Notes (Signed)
Patient was pre screened for appointment. 3 weeks ago she tested positive for Covid she is now asymptomatic and is back at work. Care team notified.

## 2019-06-08 ENCOUNTER — Inpatient Hospital Stay: Payer: Medicare Other | Attending: Oncology | Admitting: Oncology

## 2019-06-08 ENCOUNTER — Other Ambulatory Visit: Payer: Self-pay

## 2019-06-08 ENCOUNTER — Encounter: Payer: Self-pay | Admitting: Oncology

## 2019-06-08 ENCOUNTER — Inpatient Hospital Stay: Payer: Medicare Other

## 2019-06-08 VITALS — BP 126/87 | HR 58 | Temp 96.9°F | Resp 18 | Wt 240.7 lb

## 2019-06-08 DIAGNOSIS — R5383 Other fatigue: Secondary | ICD-10-CM | POA: Diagnosis not present

## 2019-06-08 DIAGNOSIS — I10 Essential (primary) hypertension: Secondary | ICD-10-CM | POA: Diagnosis not present

## 2019-06-08 DIAGNOSIS — Z811 Family history of alcohol abuse and dependence: Secondary | ICD-10-CM | POA: Insufficient documentation

## 2019-06-08 DIAGNOSIS — Z8711 Personal history of peptic ulcer disease: Secondary | ICD-10-CM | POA: Insufficient documentation

## 2019-06-08 DIAGNOSIS — D508 Other iron deficiency anemias: Secondary | ICD-10-CM

## 2019-06-08 DIAGNOSIS — Z83438 Family history of other disorder of lipoprotein metabolism and other lipidemia: Secondary | ICD-10-CM | POA: Insufficient documentation

## 2019-06-08 DIAGNOSIS — D509 Iron deficiency anemia, unspecified: Secondary | ICD-10-CM | POA: Diagnosis not present

## 2019-06-08 DIAGNOSIS — Z8249 Family history of ischemic heart disease and other diseases of the circulatory system: Secondary | ICD-10-CM | POA: Diagnosis not present

## 2019-06-08 DIAGNOSIS — Z79899 Other long term (current) drug therapy: Secondary | ICD-10-CM | POA: Insufficient documentation

## 2019-06-08 DIAGNOSIS — Z87891 Personal history of nicotine dependence: Secondary | ICD-10-CM | POA: Diagnosis not present

## 2019-06-08 DIAGNOSIS — Z833 Family history of diabetes mellitus: Secondary | ICD-10-CM | POA: Diagnosis not present

## 2019-06-08 DIAGNOSIS — Z86718 Personal history of other venous thrombosis and embolism: Secondary | ICD-10-CM | POA: Diagnosis not present

## 2019-06-08 DIAGNOSIS — Z808 Family history of malignant neoplasm of other organs or systems: Secondary | ICD-10-CM | POA: Insufficient documentation

## 2019-06-08 DIAGNOSIS — E039 Hypothyroidism, unspecified: Secondary | ICD-10-CM | POA: Insufficient documentation

## 2019-06-08 DIAGNOSIS — Z87442 Personal history of urinary calculi: Secondary | ICD-10-CM | POA: Diagnosis not present

## 2019-06-08 DIAGNOSIS — Z9884 Bariatric surgery status: Secondary | ICD-10-CM | POA: Diagnosis not present

## 2019-06-08 DIAGNOSIS — Z800 Family history of malignant neoplasm of digestive organs: Secondary | ICD-10-CM | POA: Insufficient documentation

## 2019-06-08 DIAGNOSIS — Z7901 Long term (current) use of anticoagulants: Secondary | ICD-10-CM | POA: Diagnosis not present

## 2019-06-08 DIAGNOSIS — Z818 Family history of other mental and behavioral disorders: Secondary | ICD-10-CM | POA: Diagnosis not present

## 2019-06-08 NOTE — Progress Notes (Signed)
Patient here for follow up. States she feels weak and tired. Is concerned that her B12 is low and that she will need B12 injections.

## 2019-06-08 NOTE — Progress Notes (Signed)
Hematology/Oncology Consult note [facility] Telephone:([phone] [phone]   Patient Care Team: [name], MD as PCP - General (Family Medicine) Gwyneth Revels, DPM as Referring Physician (Podiatry) Gwyneth Revels, DPM as Referring Physician (Podiatry) Schnier, Latina Craver, MD (Vascular Surgery)  REFERRING PROVIDER: Dr.Tahiliani CHIEF COMPLAINTS/REASON FOR VISIT:  Evaluation of iron deficiency anemia  HISTORY OF PRESENTING ILLNESS:  Claire Strong is a  58 y.o.  female with PMH listed below who was referred to me for evaluation of iron deficiency anemia Patient was recently referred to GI for evaluation of 3 months history of dysphagia with solid food.  Her previous labs showed low normal ferritin level at 15.Marland Kitchen Referred to me for further evaluation of iron deficiency.  She has a history of gastric bypass in 2006.  Previous colonoscopy showed hyperplastic polyps.   Associated signs and symptoms: Patient reports fatigue.  Denies SOB with exertion.  Denies weight loss, easy bruising, hematochezia, hemoptysis, hematuria. Context: History of gastric bypass History of iron deficiency: Yes, reports history of IV iron infusion.  Rectal bleeding: deneis Menstrual bleeding/ Vaginal bleeding : hysterotomy at age of 58  Hematemesis or hemoptysis : denies Last endoscopy:2006 Fatigue: Yes.  SOB: deneis   # Also history of DVT, after 1st pregnancy, history of positive lupus anticoagulant, per patient, repeat antibody panel was negative. Currently not on anticoagulation, on Aspirin.   History of basal cell carcinoma.   # EGD and colonoscopy on 07/01/2018 EGD shows reflux esophagitis.  No atypia or malignancy. Colonoscopy showed tubular adenoma.  Negative for high-grade dysplasia or malignancy. INTERVAL HISTORY Claire Strong is a 58 y.o. female who has above history reviewed by me today presents for follow up visit for management of iron  deficiency anemia,  Problems and complaints are listed below: Patient has previously received IV Feraheme treatment. Today she reports feeling tired and fatigued.  Recent Covid 19 infection. Otherwise no new complaints.  Review of Systems  Constitutional: Positive for fatigue. Negative for appetite change, chills and fever.  HENT:   Negative for hearing loss and voice change.   Eyes: Negative for eye problems.  Respiratory: Negative for chest tightness and cough.   Cardiovascular: Negative for chest pain.  Gastrointestinal: Negative for abdominal distention, abdominal pain and blood in stool.  Endocrine: Negative for hot flashes.  Genitourinary: Negative for difficulty urinating and frequency.   Musculoskeletal: Negative for arthralgias.  Skin: Negative for itching and rash.  Neurological: Negative for extremity weakness.  Hematological: Negative for adenopathy. Does not bruise/bleed easily.  Psychiatric/Behavioral: Negative for confusion.    MEDICAL HISTORY:  Past Medical History:  Diagnosis Date  . Anxiety   . Basal cell carcinoma    nose  . Bleeding disorder (HCC)   . Bronchial asthma   . Cancer (HCC)    melanoma; right knee  . 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 Trula Ore ortho)  . Chronic venous insufficiency 2007   s/p vein stripping and laser ablation  . Depression   . Diverticulosis    mild by colonoscopy  . History of DVT (deep vein thrombosis)    DVTs after 1st pregnancy, not on AC 2/2 bleeding ulcer, greenfield filter in place  . History of gastric ulcer 2009  . History of kidney stones   . Horseshoe kidney    sole, R kidney damage  . HTN (hypertension)   . Hypothyroidism   . Kidney stones    s/p renal  hematoma after lithotripsy on right  . Lupus anticoagulant disorder ([place])   . Multinodular goiter    s/p thyroidectomy  . Nasal septal perforation    chronic, ENT rec avoid antihistamine, INS  . Osteoarthritis    ?FM by  rheum  . Overweight   . Post-surgical hypothyroidism    for multinodular goiter  . 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 Frutoso Chase  . Seasonal allergies    allergy shots, singulair  . 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 Tachycardia     SURGICAL HISTORY: Past Surgical History:  Procedure Laterality Date  . ABDOMINAL HERNIA REPAIR    . ABDOMINAL HYSTERECTOMY    . ANKLE FUSION Left 05/2013  . APPENDECTOMY  1978  . BACK SURGERY  2009  . CESAREAN SECTION     X 2  . CHOLECYSTECTOMY  1998  . COLONOSCOPY  05/2012   hyperplastic polyps x2, mild diverticulosis Deatra Ina) rpt 10 yrs  . COLONOSCOPY WITH PROPOFOL N/A 07/01/2018   Procedure: COLONOSCOPY WITH PROPOFOL;  Surgeon: Virgel Manifold, MD;  Location: [facility] ENDOSCOPY;  Service: Endoscopy;  Laterality: N/A;  . CYST EXCISION     back  and shoulder  . ESI Right 10/2015, 02/2016   C4/5 then C5/6 ESI (Chasnis)  . ESI Bilateral 06/2015, 08/2015, 03/2016, 09/2017   L4/5 transforaminal ESI (Chasnis)  . ESOPHAGOGASTRODUODENOSCOPY (EGD) WITH PROPOFOL N/A 07/01/2018   Procedure: ESOPHAGOGASTRODUODENOSCOPY (EGD) WITH PROPOFOL;  Surgeon: Virgel Manifold, MD;  Location: [facility] ENDOSCOPY;  Service: Endoscopy;  Laterality: N/A;  . FL HIP INJECTION ([place] HX) Left 11/2016   Dr Sharlet Salina  . FUSION OF TALONAVICULAR JOINT Left 09/12/2016   Procedure: FUSION OF TALONAVICULAR JOINT WITH TRINITY BONE GRAFT;  Surgeon: Samara Deist, DPM;  Location: [facility] ORS;  Service: Podiatry;  Laterality: Left;  Marland Kitchen GASTRIC BYPASS  05/21/05   Dr. Frutoso Chase, Roux-en-Y  . Greenfield filter removal  2015   removed 6 wks after surgery  . hammerhead toes Left   . HERNIA REPAIR     X 3  . IVC FILTER INSERTION N/A 09/09/2016   Procedure: IVC Filter Insertion;  Surgeon: Katha Cabal, MD;  Location: [place] [facility];  Service: Cardiovascular;  Laterality: N/A;  . IVC FILTER REMOVAL N/A 11/25/2016   Procedure: IVC Filter Removal;  Surgeon: Katha Cabal, MD;  Location:  [place] [facility];  Service: Cardiovascular;  Laterality: N/A;  . JOINT REPLACEMENT    . lyphoma excision  08/2015   arm  . NASAL SEPTUM SURGERY  1981   deviated septum, repaired at [facility]  . PARTIAL HYSTERECTOMY  1989   ovaries remain  . SKIN SURGERY     basel cell  . TONSILLECTOMY  1969  . TOTAL HIP ARTHROPLASTY Right 11/06/2015   Procedure: TOTAL HIP ARTHROPLASTY ANTERIOR APPROACH;  Surgeon: Hessie Knows, MD;  Location: [facility] ORS;  Service: Orthopedics;  Laterality: Right;  . TOTAL HIP ARTHROPLASTY Left 03/19/2017   Procedure: TOTAL HIP ARTHROPLASTY ANTERIOR APPROACH;  Surgeon: Hessie Knows, MD;  Location: [facility] OR[facility];  Service: Orthopedics;  Laterality: Left;  . TOTAL KNEE ARTHROPLASTY Left 08/2013  . TOTAL THYROIDECTOMY  09/2011   benign path (done for multinodular goiter concern for cancer)    SOCIAL HISTORY: Social History   Socioeconomic History  . Marital status: Married    Spouse name: Gwyndolyn Saxon  . Number of children: 2  . Years of education: 54  . Highest education level: High school graduate  Occupational History  .  Occupation: stay at home  Tobacco Use  . Smoking status: Former Smoker    Packs/day: 0.25    Types: Cigarettes    Quit date: 06/16/2004    Years since quitting: 14.9  . Smokeless tobacco: Never Used  . Tobacco comment: Socially-no longer  Substance and Sexual Activity  . Alcohol use: Yes    Alcohol/week: 1.0 standard drinks    Types: 1 Cans of beer per week    Comment: Occasionally-wine and beer  . Drug use: No  . Sexual activity: Yes    Partners: Male    Birth control/protection: Surgical  Other Topics Concern  . Not on file  Social History Narrative  . Not on file   Social Determinants of Health   Financial Resource Strain:   . Difficulty of Paying Living Expenses: Not on file  Food Insecurity:   . Worried About Programme researcher, broadcasting/film/video in the Last Year: Not on file  . Ran Out of Food in the Last Year: Not on file  Transportation Needs:   .  Lack of Transportation (Medical): Not on file  . Lack of Transportation (Non-Medical): Not on file  Physical Activity:   . Days of Exercise per Week: Not on file  . Minutes of Exercise per Session: Not on file  Stress:   . Feeling of Stress : Not on file  Social Connections:   . Frequency of Communication with Friends and Family: Not on file  . Frequency of Social Gatherings with Friends and Family: Not on file  . Attends Religious Services: Not on file  . Active Member of Clubs or Organizations: Not on file  . Attends Banker Meetings: Not on file  . Marital Status: Not on file  Intimate Partner Violence:   . Fear of Current or Ex-Partner: Not on file  . Emotionally Abused: Not on file  . Physically Abused: Not on file  . Sexually Abused: Not on file    FAMILY HISTORY: Family History  Problem Relation Age of Onset  . Diabetes Mother        prediabetes  . Alzheimer's disease Mother   . Hyperlipidemia Mother   . Diabetes Father   . Coronary artery disease Father 75       CABG  . Alcohol abuse Father   . Lung cancer Father   . Brain cancer Maternal Uncle   . Diabetes Paternal Grandmother   . Lung cancer Brother   . Alzheimer's disease Brother   . Diabetes Maternal Grandmother   . Stroke Neg Hx   . Colon cancer Neg Hx   . Stomach cancer Neg Hx   . Kidney disease Neg Hx   . Breast cancer Neg Hx   . Kidney cancer Neg Hx   . Bladder Cancer Neg Hx   . Ovarian cancer Neg Hx     ALLERGIES:  is allergic to sulfa antibiotics; sulfur; cefuroxime axetil; nutritional supplements; other; oxycodone-acetaminophen; and betadine [povidone iodine].  MEDICATIONS:  Current Outpatient Medications  Medication Sig Dispense Refill  . acetaminophen (TYLENOL) 500 MG tablet Take 1,000 mg by mouth 2 (two) times daily.     . ACETAMINOPHEN-BUTALBITAL 50-325 MG TABS Take 1 tablet by mouth at bedtime as needed (sleep). Reported on 09/03/2015 3 times a week    . albuterol (PROVENTIL  HFA;VENTOLIN HFA) 108 (90 Base) MCG/ACT inhaler Inhale 2 puffs into the lungs every 6 (six) hours as needed for wheezing or shortness of breath. 1 Inhaler 3  . aspirin  81 MG tablet Take 81 mg by mouth daily.    . benazepril (LOTENSIN) 10 MG tablet Take 1 tablet (10 mg total) by mouth daily. 90 tablet 3  . bisacodyl (DULCOLAX) 5 MG EC tablet Take 5 mg by mouth daily as needed for moderate constipation.    Marland Kitchen buPROPion (WELLBUTRIN SR) 150 MG 12 hr tablet Take 1 tablet (150 mg total) by mouth 2 (two) times daily. (Patient taking differently: Take 150 mg by mouth every other day. ) 180 tablet 3  . calcium carbonate (CALCIUM 600) 600 MG TABS tablet Take 1 tablet (600 mg total) 2 (two) times daily with a meal by mouth. 60 tablet   . cetirizine (ZYRTEC) 10 MG tablet Take 10 mg by mouth daily.    . Cholecalciferol (VITAMIN D3) 1000 UNITS tablet Take 2,000 Units by mouth daily.     . citalopram (CELEXA) 40 MG tablet Take 1 tablet (40 mg total) by mouth daily. 90 tablet 3  . cyclobenzaprine (FLEXERIL) 10 MG tablet cyclobenzaprine 10 mg tablet    . EPINEPHrine (EPI-PEN) 0.3 mg/0.3 mL DEVI Inject 0.3 mLs (0.3 mg total) into the muscle once. (Patient taking differently: Inject 0.3 mg into the muscle once. ) 1 Device 2  . estradiol (ESTRACE) 2 MG tablet Take 1 tablet by mouth daily.   0  . Fluticasone-Salmeterol (ADVAIR DISKUS) 100-50 MCG/DOSE AEPB Inhale 1 puff into the lungs 2 (two) times daily. 60 each 11  . furosemide (LASIX) 20 MG tablet Take 1 tablet by mouth once daily 90 tablet 0  . levothyroxine (SYNTHROID, LEVOTHROID) 150 MCG tablet Take 175 mcg by mouth daily before breakfast.    . liothyronine (CYTOMEL) 5 MCG tablet liothyronine 5 mcg tablet    . metoprolol tartrate (LOPRESSOR) 25 MG tablet Take 1 tablet (25 mg total) by mouth 2 (two) times daily. 180 tablet 3  . montelukast (SINGULAIR) 10 MG tablet Take 10 mg by mouth every morning.     . ondansetron (ZOFRAN) 4 MG tablet Take 1 tablet (4 mg total) by  mouth every 8 (eight) hours as needed for nausea or vomiting. 30 tablet 0  . pantoprazole (PROTONIX) 20 MG tablet Take 1 tablet by mouth twice daily 60 tablet 1  . SUMAtriptan (IMITREX) 50 MG tablet Take 50 mg by mouth every 2 (two) hours as needed for migraine. May repeat in 2 hours if headache persists or recurs.    . traZODone (DESYREL) 50 MG tablet TAKE 1/2 TO 1 (ONE-HALF TO ONE) TABLET BY MOUTH AT BEDTIME AS NEEDED FOR SLEEP 90 tablet 1  . Turmeric 500 MG CAPS Take 1 capsule by mouth 2 (two) times daily.    . vitamin B-12 (CYANOCOBALAMIN) 500 MCG tablet Take 500 mcg by mouth daily.    Marland Kitchen zinc gluconate 50 MG tablet Take 50 mg by mouth daily.     No current facility-administered medications for this visit.     PHYSICAL EXAMINATION: ECOG PERFORMANCE STATUS: 1 - Symptomatic but completely ambulatory Vitals:   06/08/19 1307  BP: 126/87  Pulse: (!) 58  Resp: 18  Temp: (!) 96.9 F (36.1 C)   Filed Weights   06/08/19 1307  Weight: 240 lb 11.2 oz (109.2 kg)    Physical Exam Constitutional:      General: She is not in acute distress. HENT:     Head: Normocephalic and atraumatic.  Eyes:     General: No scleral icterus.    Pupils: Pupils are equal, round, and reactive to light.  Cardiovascular:     Rate and Rhythm: Normal rate.     Heart sounds: Normal heart sounds.  Pulmonary:     Effort: Pulmonary effort is normal.  Abdominal:     General: There is no distension.  Musculoskeletal:        General: No deformity. Normal range of motion.     Cervical back: Normal range of motion and neck supple.  Skin:    Findings: No erythema or rash.  Neurological:     General: No focal deficit present.     Mental Status: She is alert and oriented to person, place, and time.  Psychiatric:        Mood and Affect: Mood normal.      LABORATORY DATA:  I have reviewed the data as listed Lab Results  Component Value Date   WBC 5.6 06/06/2019   HGB 13.2 06/06/2019   HCT 41.5 06/06/2019    MCV 94.1 06/06/2019   PLT 194 06/06/2019   Recent Labs    10/08/18 1255 02/02/19 0919  NA  --  140  K  --  4.3  CL  --  105  CO2  --  23  GLUCOSE  --  84  BUN  --  11  CREATININE 0.90 0.86  CALCIUM  --  8.3*  GFRNONAA  --  75  GFRAA  --  87  PROT  --  5.9*  ALBUMIN  --  3.9  AST  --  18  ALT  --  15  ALKPHOS  --  75  BILITOT  --  0.3   Iron/TIBC/Ferritin/ %Sat    Component Value Date/Time   IRON 93 06/06/2019 0940   IRON 54 02/03/2018 1033   TIBC 319 06/06/2019 0940   TIBC 358 02/03/2018 1033   FERRITIN 63 06/06/2019 0940   FERRITIN 16 02/03/2018 1033   IRONPCTSAT 29 06/06/2019 0940   IRONPCTSAT 15 02/03/2018 1033      ASSESSMENT & PLAN:  1. Other iron deficiency anemia   2. History of gastric bypass    Labs are reviewed and discussed with patient. Iron panel and hemoglobin both have been stable.  No need for iron iron at this point. Given her history of gastric bypass, will continue blood work/evaluation every 6 months.  Check vitamin B12 at the next level. Fatigue, worsened after recent COVID-19 infection.  Continue monitor.   Orders Placed This Encounter  Procedures  . CBC with Differential/Platelet    Standing Status:   Future    Standing Expiration Date:   06/07/2020  . Ferritin    Standing Status:   Future    Standing Expiration Date:   06/07/2020  . Iron and TIBC    Standing Status:   Future    Standing Expiration Date:   06/07/2020  . Vitamin B12    Standing Status:   Future    Standing Expiration Date:   06/07/2020    All questions were answered. The patient knows to call the clinic with any problems questions or concerns.  Return of visit:6 months.  We spent sufficient time to discuss many aspect of care, questions were answered to patient's satisfaction. Total face to face encounter time for this patient visit was 15 min. >50% of the time was  spent in counseling and coordination of care.    Rickard Patience, MD, PhD Hematology  Oncology [facility] Pager- [phone] 06/08/2019

## 2019-06-11 ENCOUNTER — Other Ambulatory Visit: Payer: Self-pay | Admitting: Family Medicine

## 2019-07-10 ENCOUNTER — Other Ambulatory Visit: Payer: Self-pay | Admitting: Cardiovascular Disease

## 2019-07-15 DIAGNOSIS — R350 Frequency of micturition: Secondary | ICD-10-CM | POA: Diagnosis not present

## 2019-07-15 DIAGNOSIS — D699 Hemorrhagic condition, unspecified: Secondary | ICD-10-CM | POA: Diagnosis not present

## 2019-07-22 ENCOUNTER — Encounter: Payer: Self-pay | Admitting: Family Medicine

## 2019-07-22 ENCOUNTER — Other Ambulatory Visit: Payer: Self-pay

## 2019-07-22 ENCOUNTER — Ambulatory Visit (INDEPENDENT_AMBULATORY_CARE_PROVIDER_SITE_OTHER): Payer: Medicare Other | Admitting: Family Medicine

## 2019-07-22 VITALS — BP 110/64 | HR 64 | Temp 96.9°F | Ht 68.0 in | Wt 245.0 lb

## 2019-07-22 DIAGNOSIS — E890 Postprocedural hypothyroidism: Secondary | ICD-10-CM

## 2019-07-22 DIAGNOSIS — I10 Essential (primary) hypertension: Secondary | ICD-10-CM | POA: Diagnosis not present

## 2019-07-22 DIAGNOSIS — F331 Major depressive disorder, recurrent, moderate: Secondary | ICD-10-CM | POA: Insufficient documentation

## 2019-07-22 DIAGNOSIS — F411 Generalized anxiety disorder: Secondary | ICD-10-CM | POA: Insufficient documentation

## 2019-07-22 DIAGNOSIS — E211 Secondary hyperparathyroidism, not elsewhere classified: Secondary | ICD-10-CM

## 2019-07-22 DIAGNOSIS — I700 Atherosclerosis of aorta: Secondary | ICD-10-CM | POA: Diagnosis not present

## 2019-07-22 DIAGNOSIS — I471 Supraventricular tachycardia: Secondary | ICD-10-CM | POA: Diagnosis not present

## 2019-07-22 MED ORDER — FUROSEMIDE 40 MG PO TABS: 40.0000 mg | ORAL_TABLET | Freq: Every day | ORAL | 3 refills | Status: DC

## 2019-07-22 NOTE — Assessment & Plan Note (Signed)
Discussed importance of healthy weight management Discussed diet and exercise  

## 2019-07-22 NOTE — Progress Notes (Signed)
Patient: Claire Strong Female    DOB: Sep 14, 1960   59 y.o.   MRN: [id number] Visit Date: 07/22/2019  Today's Provider: Shirlee Latch, MD   Chief Complaint  Patient presents with  . Hypertension  . Depression  . Anxiety   Subjective:     HPI    Hypertension, follow-up:  BP Readings from Last 3 Encounters:  07/22/19 110/64  06/08/19 126/87  03/07/19 117/76    She was last seen for hypertension 6 months ago.  Management since that visit includes Pt added an extra lasix a day secondary to leg swelling. She reports excellent compliance with treatment. She is having side effects.  She is exercising. She is adherent to low salt diet.   Outside blood pressures are running normal. She is experiencing lower extremity edema.  Patient denies chest pain, dyspnea, fatigue and paroxysmal nocturnal dyspnea.   Cardiovascular risk factors include hypertension and obesity (BMI >= 30 kg/m2).  Use of agents associated with hypertension: none.     Weight trend: increasing steadily Wt Readings from Last 3 Encounters:  07/22/19 245 lb (111.1 kg)  06/08/19 240 lb 11.2 oz (109.2 kg)  04/29/19 236 lb (107 kg)    Current diet: in general, a "healthy" diet    ------------------------------------------------------------------------  Feels like anxiety and depression are stable and slowly improving.  Feels like she needs to continue to accept her current position and not worry about it.  No medication side effects.  Taking with good compliance.  GAD 7 : Generalized Anxiety Score 07/22/2019  Nervous, Anxious, on Edge 1  Control/stop worrying 1  Worry too much - different things 0  Trouble relaxing 2  Restless 1  Easily annoyed or irritable 1  Afraid - awful might happen 0  Total GAD 7 Score 6  Anxiety Difficulty Somewhat difficult     Depression screen [facility] 2/9 07/22/2019 01/19/2019 05/06/2018 02/03/2018 11/03/2017  Decreased Interest 1 2 2 1  0  Down, Depressed, Hopeless 1 2 2 1   0  PHQ - 2 Score 2 4 4 2  0  Altered sleeping 0 1 1 0 0  Tired, decreased energy 2 1 3 3 3   Change in appetite 2 1 3 2 1   Feeling bad or failure about yourself  2 3 3 1 1   Trouble concentrating 2 2 1 2 1   Moving slowly or fidgety/restless 0 0 1 3 1   Suicidal thoughts 0 0 2 0 0  PHQ-9 Score 10 12 18 13 7   Difficult doing work/chores Not difficult at all Somewhat difficult Somewhat difficult Not difficult at all Somewhat difficult  Some recent data might be hidden      Allergies  Allergen Reactions  . Sulfa Antibiotics Shortness Of Breath  . Sulfur Anaphylaxis  . Cefuroxime Axetil Other (See Comments)    Bleeding ulcer  . Nutritional Supplements Swelling    Unsure which  . Other     All narcotics   . Oxycodone-Acetaminophen Nausea And Vomiting  . Betadine [Povidone Iodine] Rash     Current Outpatient Medications:  .  acetaminophen (TYLENOL) 500 MG tablet, Take 1,000 mg by mouth 2 (two) times daily. , Disp: , Rfl:  .  ACETAMINOPHEN-BUTALBITAL 50-325 MG TABS, Take 1 tablet by mouth at bedtime as needed (sleep). Reported on 09/03/2015 3 times a week, Disp: , Rfl:  .  albuterol (PROVENTIL HFA;VENTOLIN HFA) 108 (90 Base) MCG/ACT inhaler, Inhale 2 puffs into the lungs every 6 (six) hours as needed  for wheezing or shortness of breath., Disp: 1 Inhaler, Rfl: 3 .  aspirin 81 MG tablet, Take 81 mg by mouth daily., Disp: , Rfl:  .  benazepril (LOTENSIN) 10 MG tablet, Take 1 tablet by mouth once daily, Disp: 90 tablet, Rfl: 0 .  bisacodyl (DULCOLAX) 5 MG EC tablet, Take 5 mg by mouth daily as needed for moderate constipation., Disp: , Rfl:  .  calcium carbonate (CALCIUM 600) 600 MG TABS tablet, Take 1 tablet (600 mg total) 2 (two) times daily with a meal by mouth., Disp: 60 tablet, Rfl:  .  cetirizine (ZYRTEC) 10 MG tablet, Take 10 mg by mouth daily., Disp: , Rfl:  .  Cholecalciferol (VITAMIN D3) 1000 UNITS tablet, Take 2,000 Units by mouth daily. , Disp: , Rfl:  .  citalopram (CELEXA) 40 MG  tablet, Take 1 tablet (40 mg total) by mouth daily., Disp: 90 tablet, Rfl: 3 .  cyclobenzaprine (FLEXERIL) 10 MG tablet, cyclobenzaprine 10 mg tablet, Disp: , Rfl:  .  EPINEPHrine (EPI-PEN) 0.3 mg/0.3 mL DEVI, Inject 0.3 mLs (0.3 mg total) into the muscle once. (Patient taking differently: Inject 0.3 mg into the muscle once. ), Disp: 1 Device, Rfl: 2 .  estradiol (ESTRACE) 2 MG tablet, Take 1 tablet by mouth daily. , Disp: , Rfl: 0 .  Fluticasone-Salmeterol (ADVAIR DISKUS) 100-50 MCG/DOSE AEPB, Inhale 1 puff into the lungs 2 (two) times daily., Disp: 60 each, Rfl: 11 .  furosemide (LASIX) 40 MG tablet, Take 1 tablet (40 mg total) by mouth daily., Disp: 90 tablet, Rfl: 3 .  liothyronine (CYTOMEL) 5 MCG tablet, liothyronine 5 mcg tablet, Disp: , Rfl:  .  metoprolol tartrate (LOPRESSOR) 25 MG tablet, Take 1 tablet (25 mg total) by mouth 2 (two) times daily. Please call to schedule office visit for further refills. Thank you!, Disp: 60 tablet, Rfl: 0 .  montelukast (SINGULAIR) 10 MG tablet, Take 10 mg by mouth every morning. , Disp: , Rfl:  .  ondansetron (ZOFRAN) 4 MG tablet, Take 1 tablet (4 mg total) by mouth every 8 (eight) hours as needed for nausea or vomiting., Disp: 30 tablet, Rfl: 0 .  pantoprazole (PROTONIX) 20 MG tablet, Take 1 tablet by mouth twice daily, Disp: 60 tablet, Rfl: 1 .  solifenacin (VESICARE) 5 MG tablet, Take by mouth., Disp: , Rfl:  .  SUMAtriptan (IMITREX) 50 MG tablet, Take 50 mg by mouth every 2 (two) hours as needed for migraine. May repeat in 2 hours if headache persists or recurs., Disp: , Rfl:  .  traZODone (DESYREL) 50 MG tablet, TAKE 1/2 TO 1 (ONE-HALF TO ONE) TABLET BY MOUTH AT BEDTIME AS NEEDED FOR SLEEP, Disp: 90 tablet, Rfl: 1 .  Turmeric 500 MG CAPS, Take 1 capsule by mouth 2 (two) times daily., Disp: , Rfl:  .  vitamin B-12 (CYANOCOBALAMIN) 500 MCG tablet, Take 500 mcg by mouth daily., Disp: , Rfl:  .  zinc gluconate 50 MG tablet, Take 50 mg by mouth daily., Disp:  , Rfl:  .  buPROPion (WELLBUTRIN SR) 150 MG 12 hr tablet, Take 1 tablet (150 mg total) by mouth 2 (two) times daily. (Patient taking differently: Take 150 mg by mouth daily. ), Disp: 180 tablet, Rfl: 3 .  levothyroxine (SYNTHROID) 150 MCG tablet, Take by mouth., Disp: , Rfl:  .  levothyroxine (SYNTHROID) 175 MCG tablet, TAKE 1 TABLET BY MOUTH EVERY OTHER DAY (ALTERNATE WITH EVERY OTHER DAY), Disp: , Rfl:   Review of Systems  Constitutional:  Negative.   Respiratory: Negative.   Cardiovascular: Positive for leg swelling (Left worse than right.). Negative for chest pain and palpitations.  Gastrointestinal: Negative.   Musculoskeletal: Positive for back pain. Negative for arthralgias, gait problem, joint swelling, myalgias, neck pain and neck stiffness.  Neurological: Positive for headaches (History of Migraines). Negative for dizziness and light-headedness.    Social History   Tobacco Use  . Smoking status: Former Smoker    Packs/day: 0.25    Types: Cigarettes    Quit date: 06/16/2004    Years since quitting: 15.1  . Smokeless tobacco: Never Used  . Tobacco comment: Socially-no longer  Substance Use Topics  . Alcohol use: Yes    Alcohol/week: 1.0 standard drinks    Types: 1 Cans of beer per week    Comment: Occasionally-wine and beer      Objective:   BP 110/64 (BP Location: Right Arm, Patient Position: Sitting, Cuff Size: Large)   Pulse 64   Temp (!) 96.9 F (36.1 C) (Temporal)   Ht 5\' 8"  (1.727 m)   Wt 245 lb (111.1 kg)   SpO2 99%   BMI 37.25 kg/m  Vitals:   07/22/19 0845  BP: 110/64  Pulse: 64  Temp: (!) 96.9 F (36.1 C)  TempSrc: Temporal  SpO2: 99%  Weight: 245 lb (111.1 kg)  Height: 5\' 8"  (1.727 m)  Body mass index is 37.25 kg/m.   Physical Exam Vitals reviewed.  Constitutional:      General: She is not in acute distress.    Appearance: Normal appearance. She is well-developed. She is not diaphoretic.  HENT:     Head: Normocephalic and atraumatic.   Eyes:     General: No scleral icterus.    Conjunctiva/sclera: Conjunctivae normal.  Neck:     Thyroid: No thyromegaly.  Cardiovascular:     Rate and Rhythm: Normal rate and regular rhythm.     Pulses: Normal pulses.     Heart sounds: Normal heart sounds. No murmur.  Pulmonary:     Effort: Pulmonary effort is normal. No respiratory distress.     Breath sounds: Normal breath sounds. No wheezing, rhonchi or rales.  Abdominal:     General: There is no distension.     Palpations: Abdomen is soft.     Tenderness: There is no abdominal tenderness.  Musculoskeletal:     Cervical back: Neck supple.     Right lower leg: No edema.     Left lower leg: No edema.  Lymphadenopathy:     Cervical: No cervical adenopathy.  Skin:    General: Skin is warm and dry.     Capillary Refill: Capillary refill takes less than 2 seconds.     Findings: No rash.  Neurological:     Mental Status: She is alert and oriented to person, place, and time. Mental status is at baseline.  Psychiatric:        Mood and Affect: Mood normal.        Behavior: Behavior normal.      No results found for any visits on 07/22/19.     Assessment & Plan    Problem List Items Addressed This Visit      Cardiovascular and Mediastinum   HTN (hypertension) - Primary    Well controlled Continue current medications We will go ahead with increasing Lasix to 40 mg daily as this is what she has been taking for the last week and had improvement in lower extremity edema Recheck metabolic panel F/u  in 6 months       Relevant Medications   furosemide (LASIX) 40 MG tablet   Other Relevant Orders   Basic Metabolic Panel (BMET)   Atrial tachycardia (HCC)    Well-controlled Continue metoprolol at current dose      Relevant Medications   furosemide (LASIX) 40 MG tablet     Endocrine   Hypothyroidism, postsurgical    Followed by ENT Last TSH within normal limits Continue Synthroid at current dose      Relevant  Medications   levothyroxine (SYNTHROID) 175 MCG tablet   levothyroxine (SYNTHROID) 150 MCG tablet   Secondary hyperparathyroidism, non-renal (HCC)    Patient was previously worked up for this and found to have normal calcium and vitamin D levels It was suspected to be related to her low calcium intake and she started calcium supplements twice daily She had a bone density scan that was within normal limits in 07/2017        Other   Morbid obesity (HCC)    Discussed importance of healthy weight management Discussed diet and exercise      Moderate episode of recurrent major depressive disorder (HCC)    Chronic and stable Fairly well-controlled Continue Celexa, Wellbutrin, trazodone at this time She believes that external stress factors are contributing to her depression and anxiety symptoms, so is unlikely that changing medications at this time would improve things We have discussed the synergistic effects of therapy and medications previously Follow-up in 6 months      GAD (generalized anxiety disorder)    Chronic and stable Fairly well-controlled Continue Celexa, Wellbutrin, trazodone at this time She believes that external stress factors are contributing to her depression and anxiety symptoms, so is unlikely that changing medications at this time would improve things We have discussed the synergistic effects of therapy and medications previously Follow-up in 6 months       Other Visit Diagnoses    Aortic atherosclerosis (HCC)   (Chronic)     Relevant Medications   furosemide (LASIX) 40 MG tablet       Return in about 6 months (around 01/19/2020) for CPE/AWV.   The entirety of the information documented in the History of Present Illness, Review of Systems and Physical Exam were personally obtained by me. Portions of this information were initially documented by Kavin Leech, CMA and reviewed by me for thoroughness and accuracy.    Talesha Ellithorpe, Marzella Schlein, MD MPH [facility]

## 2019-07-22 NOTE — Assessment & Plan Note (Signed)
Chronic and stable Fairly well-controlled Continue Celexa, Wellbutrin, trazodone at this time She believes that external stress factors are contributing to her depression and anxiety symptoms, so is unlikely that changing medications at this time would improve things We have discussed the synergistic effects of therapy and medications previously Follow-up in 6 months

## 2019-07-22 NOTE — Assessment & Plan Note (Signed)
Followed by ENT Last TSH within normal limits Continue Synthroid at current dose

## 2019-07-22 NOTE — Assessment & Plan Note (Signed)
Well controlled Continue current medications We will go ahead with increasing Lasix to 40 mg daily as this is what she has been taking for the last week and had improvement in lower extremity edema Recheck metabolic panel F/u in 6 months

## 2019-07-22 NOTE — Assessment & Plan Note (Signed)
Patient was previously worked up for this and found to have normal calcium and vitamin D levels It was suspected to be related to her low calcium intake and she started calcium supplements twice daily She had a bone density scan that was within normal limits in 07/2017

## 2019-07-22 NOTE — Assessment & Plan Note (Signed)
Well-controlled Continue metoprolol at current dose

## 2019-07-23 LAB — BASIC METABOLIC PANEL
BUN/Creatinine Ratio: 15 (ref 9–23)
BUN: 13 mg/dL (ref 6–24)
CO2: 23 mmol/L (ref 20–29)
Calcium: 8.2 mg/dL — ABNORMAL LOW (ref 8.7–10.2)
Chloride: 102 mmol/L (ref 96–106)
Creatinine, Ser: 0.89 mg/dL (ref 0.57–1.00)
GFR calc Af Amer: 83 mL/min/{1.73_m2} (ref >59)
GFR calc non Af Amer: 72 mL/min/{1.73_m2} (ref >59)
Glucose: 78 mg/dL (ref 65–99)
Potassium: 4.5 mmol/L (ref 3.5–5.2)
Sodium: 137 mmol/L (ref 134–144)

## 2019-07-24 ENCOUNTER — Other Ambulatory Visit: Payer: Self-pay | Admitting: Family Medicine

## 2019-07-24 ENCOUNTER — Other Ambulatory Visit: Payer: Self-pay | Admitting: Gastroenterology

## 2019-07-24 NOTE — Telephone Encounter (Signed)
Requested Prescriptions  Pending Prescriptions Disp Refills  . traZODone (DESYREL) 50 MG tablet [Pharmacy Med Name: TRAZODONE 50MG       TAB] 90 tablet 0    Sig: TAKE 1/2 TO 1 (ONE-HALF TO ONE) TABLET BY MOUTH AT BEDTIME AS NEEDED FOR SLEEP     There is no refill protocol information for this order

## 2019-07-25 ENCOUNTER — Telehealth: Payer: Self-pay

## 2019-07-25 NOTE — Telephone Encounter (Signed)
Pt advised.   Thanks,   -Chaunda Vandergriff  

## 2019-07-25 NOTE — Telephone Encounter (Signed)
-----   Message from [name], MD sent at 07/25/2019  8:18 AM EST ----- Normal labs, except for stable chronic low calcium

## 2019-08-08 ENCOUNTER — Other Ambulatory Visit: Payer: Self-pay | Admitting: Cardiovascular Disease

## 2019-08-08 NOTE — Telephone Encounter (Signed)
Patient will need an appointment for further refills, Thanks !  Last seen in 2019.

## 2019-08-08 NOTE — Telephone Encounter (Signed)
LVM for patient to call and schedule

## 2019-08-08 NOTE — Progress Notes (Signed)
Office Visit    Patient Name: Claire Strong Date of Encounter: 08/10/2019  Primary Care Provider:  [name], MD Primary Cardiologist:  Julien Nordmann, MD Electrophysiologist:  None   Chief Complaint    SOPHEAP BASIC is a 59 y.o. female with a hx of HTN, atrial tachycardia, hypothyroidism, obesity, depression, GAD, aortic atherosclerosis presents today for follow up of HTN and atrial tachycardia.    Past Medical History    Past Medical History:  Diagnosis Date  . Anxiety   . Basal cell carcinoma    nose  . Bleeding disorder (HCC)   . Bronchial asthma   . Cancer (HCC)    melanoma; right knee  . 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 Trula Ore ortho)  . Chronic venous insufficiency 2007   s/p vein stripping and laser ablation  . Depression   . Diverticulosis    mild by colonoscopy  . History of DVT (deep vein thrombosis)    DVTs after 1st pregnancy, not on AC 2/2 bleeding ulcer, greenfield filter in place  . History of gastric ulcer 2009  . History of kidney stones   . Horseshoe kidney    sole, R kidney damage  . HTN (hypertension)   . Hypothyroidism   . Kidney stones    s/p renal hematoma after lithotripsy on right  . Lupus anticoagulant disorder (HCC)   . Multinodular goiter    s/p thyroidectomy  . Nasal septal perforation    chronic, ENT rec avoid antihistamine, INS  . Osteoarthritis    ?FM by rheum  . Overweight   . Post-surgical hypothyroidism    for multinodular goiter  . 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 Jacqulyn Ducking  . Seasonal allergies    allergy shots, singulair  . 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 Tachycardia    Past Surgical History:  Procedure Laterality Date  . ABDOMINAL HERNIA REPAIR    . ABDOMINAL HYSTERECTOMY    . ANKLE FUSION Left 05/2013  . APPENDECTOMY  1978  . BACK SURGERY  2009  . CESAREAN SECTION     X 2  . CHOLECYSTECTOMY  1998  . COLONOSCOPY  05/2012   hyperplastic polyps  x2, mild diverticulosis Arlyce Dice) rpt 10 yrs  . COLONOSCOPY WITH PROPOFOL N/A 07/01/2018   Procedure: COLONOSCOPY WITH PROPOFOL;  Surgeon: Pasty Spillers, MD;  Location: [facility];  Service: Endoscopy;  Laterality: N/A;  . CYST EXCISION     back  and shoulder  . ESI Right 10/2015, 02/2016   C4/5 then C5/6 ESI (Chasnis)  . ESI Bilateral 06/2015, 08/2015, 03/2016, 09/2017   L4/5 transforaminal ESI (Chasnis)  . ESOPHAGOGASTRODUODENOSCOPY (EGD) WITH PROPOFOL N/A 07/01/2018   Procedure: ESOPHAGOGASTRODUODENOSCOPY (EGD) WITH PROPOFOL;  Surgeon: Pasty Spillers, MD;  Location: [facility];  Service: Endoscopy;  Laterality: N/A;  . FL HIP INJECTION ([facility] HX) Left 11/2016   Dr Yves Dill  . FUSION OF TALONAVICULAR JOINT Left 09/12/2016   Procedure: FUSION OF TALONAVICULAR JOINT WITH TRINITY BONE GRAFT;  Surgeon: Gwyneth Revels, DPM;  Location: [facility];  Service: Podiatry;  Laterality: Left;  Marland Kitchen GASTRIC BYPASS  05/21/05   Dr. Jacqulyn Ducking, Roux-en-Y  . Greenfield filter removal  2015   removed 6 wks after surgery  . hammerhead toes Left   . HERNIA REPAIR     X 3  . IVC FILTER INSERTION N/A 09/09/2016   Procedure: IVC Filter Insertion;  Surgeon: Earl Lites  Eloise Levels, MD;  Location: [place] [facility];  Service: Cardiovascular;  Laterality: N/A;  . IVC FILTER REMOVAL N/A 11/25/2016   Procedure: IVC Filter Removal;  Surgeon: Katha Cabal, MD;  Location: [place] [facility];  Service: Cardiovascular;  Laterality: N/A;  . JOINT REPLACEMENT    . lyphoma excision  08/2015   arm  . NASAL SEPTUM SURGERY  1981   deviated septum, repaired at [facility]  . PARTIAL HYSTERECTOMY  1989   ovaries remain  . SKIN SURGERY     basel cell  . TONSILLECTOMY  1969  . TOTAL HIP ARTHROPLASTY Right 11/06/2015   Procedure: TOTAL HIP ARTHROPLASTY ANTERIOR APPROACH;  Surgeon: Hessie Knows, MD;  Location: [facility];  Service: Orthopedics;  Laterality: Right;  . TOTAL HIP ARTHROPLASTY Left 03/19/2017   Procedure: TOTAL HIP  ARTHROPLASTY ANTERIOR APPROACH;  Surgeon: Hessie Knows, MD;  Location: [facility];  Service: Orthopedics;  Laterality: Left;  . TOTAL KNEE ARTHROPLASTY Left 08/2013  . TOTAL THYROIDECTOMY  09/2011   benign path (done for multinodular goiter concern for cancer)    Allergies  Allergies  Allergen Reactions  . Sulfa Antibiotics Shortness Of Breath  . Sulfur Anaphylaxis  . Cefuroxime Axetil Other (See Comments)    Bleeding ulcer  . Nutritional Supplements Swelling    Unsure which  . Other     All narcotics   . Oxycodone-Acetaminophen Nausea And Vomiting  . Betadine [Povidone Iodine] Rash    History of Present Illness    Claire Strong is a 59 y.o. female with a hx of  HTN, atrial tachycardia, hypothyroidism, obesity, depression, GAD, aortic atherosclerosis. She was last seen 03/02/18 by Dr. Rockey Situ.  Previous Holter 2013 and ZIO 2019 with ectopy and short runs of atrial tachycardia. CT 08/2016 with mild atherosclerotic calcification of the abdominal aorta.   She reports feeling well. 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 She, her husband, and her granddaughter all had COVID in November. Tells me she notices some residual weakness.   No formal exercise routine. Exercise is limited by history of multiple orthopedic surgeries. She does try to monitor her diet.  No chest pain, pressure, tightness. No shortness of breath nor dyspnea with exertion. No orthopnea, PND.   Endorses lower extremity edema. She recently increased her Lasix to 40mg  and her primary care agreed for her to continue. She does elevate legs when sitting. Tells me she cannot tolerate compression stockings. Does try to avoid salt.   EKGs/Labs/Other Studies Reviewed:   The following studies were reviewed today:  ZIO 2019 Normal sinus rhythm Avg HR of 60 bpm.    16 Supraventricular Tachycardia/atrial tachycardia runs occurred, the run with the fastest interval lasting 17 beats with a max rate of 130  bpm, the longest lasting 13.6 secs with an avg rate of 102 bpm.    Isolated SVEs were rare (<1.0%), SVE Couplets were rare (<1.0%), and SVE Triplets were rare (<1.0%). Isolated VEs were rare (<1.0%), and no VE Couplets or VE Triplets were present. Ventricular Trigeminy was present.   Patient triggered events were not associated with significant arrhythmia   EKG:  EKG is ordered today.  The ekg ordered today demonstrates SB 58 bpm no acute ST/T wave changes.  Recent Labs: 02/02/2019: ALT 15 04/29/2019: TSH 0.663 06/06/2019: Hemoglobin 13.2; Platelets 194 07/22/2019: BUN 13; Creatinine, Ser 0.89; Potassium 4.5; Sodium 137  Recent Lipid Panel    Component Value Date/Time   CHOL 162 02/02/2019 0919  TRIG 171 (H) 02/02/2019 0919   HDL 51 02/02/2019 0919   CHOLHDL 3.2 02/02/2019 0919   CHOLHDL 4 04/23/2017 0843   VLDL 24.4 04/23/2017 0843   LDLCALC 77 02/02/2019 0919    Home Medications   Current Meds  Medication Sig  . acetaminophen (TYLENOL) 500 MG tablet Take 1,000 mg by mouth 2 (two) times daily.   . ACETAMINOPHEN-BUTALBITAL 50-325 MG TABS Take 1 tablet by mouth at bedtime as needed (sleep). Reported on 09/03/2015 3 times a week  . albuterol (PROVENTIL HFA;VENTOLIN HFA) 108 (90 Base) MCG/ACT inhaler Inhale 2 puffs into the lungs every 6 (six) hours as needed for wheezing or shortness of breath.  Marland Kitchen aspirin 81 MG tablet Take 81 mg by mouth daily.  . benazepril (LOTENSIN) 10 MG tablet Take 1 tablet by mouth once daily  . bisacodyl (DULCOLAX) 5 MG EC tablet Take 5 mg by mouth daily as needed for moderate constipation.  Marland Kitchen buPROPion (WELLBUTRIN SR) 150 MG 12 hr tablet Take 1 tablet (150 mg total) by mouth 2 (two) times daily. (Patient taking differently: Take 150 mg by mouth daily. )  . calcium carbonate (CALCIUM 600) 600 MG TABS tablet Take 1 tablet (600 mg total) 2 (two) times daily with a meal by mouth.  . cetirizine (ZYRTEC) 10 MG tablet Take 10 mg by mouth daily.  . Cholecalciferol  (VITAMIN D3) 1000 UNITS tablet Take 2,000 Units by mouth daily.   . citalopram (CELEXA) 40 MG tablet Take 1 tablet (40 mg total) by mouth daily.  . cyclobenzaprine (FLEXERIL) 10 MG tablet cyclobenzaprine 10 mg tablet  . EPINEPHrine (EPI-PEN) 0.3 mg/0.3 mL DEVI Inject 0.3 mLs (0.3 mg total) into the muscle once. (Patient taking differently: Inject 0.3 mg into the muscle once. )  . estradiol (ESTRACE) 2 MG tablet Take 1 tablet by mouth daily.   . Fluticasone-Salmeterol (ADVAIR DISKUS) 100-50 MCG/DOSE AEPB Inhale 1 puff into the lungs 2 (two) times daily.  . furosemide (LASIX) 40 MG tablet Take 1 tablet (40 mg total) by mouth daily.  Marland Kitchen levothyroxine (SYNTHROID) 150 MCG tablet Take by mouth.  . levothyroxine (SYNTHROID) 175 MCG tablet TAKE 1 TABLET BY MOUTH EVERY OTHER DAY (ALTERNATE WITH EVERY OTHER DAY)  . liothyronine (CYTOMEL) 5 MCG tablet liothyronine 5 mcg tablet  . metoprolol tartrate (LOPRESSOR) 25 MG tablet TAKE 1 TABLET BY MOUTH TWICE DAILY PLEASE  CALL  TO  SCHEDULE  OFFICE  VISIT  FOR  FURTHER  REFILLS  THANK  YOU  . montelukast (SINGULAIR) 10 MG tablet Take 10 mg by mouth every morning.   . ondansetron (ZOFRAN) 4 MG tablet Take 1 tablet (4 mg total) by mouth every 8 (eight) hours as needed for nausea or vomiting.  . pantoprazole (PROTONIX) 20 MG tablet Take 1 tablet by mouth twice daily  . solifenacin (VESICARE) 5 MG tablet Take by mouth.  . SUMAtriptan (IMITREX) 50 MG tablet Take 50 mg by mouth every 2 (two) hours as needed for migraine. May repeat in 2 hours if headache persists or recurs.  . traZODone (DESYREL) 50 MG tablet TAKE 1/2 TO 1 (ONE-HALF TO ONE) TABLET BY MOUTH AT BEDTIME AS NEEDED FOR SLEEP  . Turmeric 500 MG CAPS Take 1 capsule by mouth 2 (two) times daily.  . vitamin B-12 (CYANOCOBALAMIN) 500 MCG tablet Take 500 mcg by mouth daily.  Marland Kitchen zinc gluconate 50 MG tablet Take 50 mg by mouth daily.    Review of Systems    Review of Systems  Constitution: Negative for  chills, fever and malaise/fatigue.  Cardiovascular: Positive for palpitations. Negative for chest pain, dyspnea on exertion, leg swelling, near-syncope, orthopnea and syncope.  Respiratory: Negative for cough, shortness of breath and wheezing.   Gastrointestinal: Negative for nausea and vomiting.  Neurological: Negative for dizziness, light-headedness and weakness.   All other systems reviewed and are otherwise negative except as noted above.  Physical Exam    VS:  BP 110/60 (BP Location: Left Arm, Patient Position: Sitting, Cuff Size: Normal)   Pulse (!) 58   Ht 5\' 8"  (1.727 m)   Wt 243 lb (110.2 kg)   SpO2 97%   BMI 36.95 kg/m  , BMI Body mass index is 36.95 kg/m. GEN: Well nourished, overweight, well developed, in no acute distress. HEENT: normal. Neck: Supple, no JVD, carotid bruits, or masses. Cardiac: RRR, no murmurs, rubs, or gallops. No clubbing, cyanosis. Nonpitting bilateral LE edema.  Radials/DP/PT 2+ and equal bilaterally.  Respiratory:  Respirations regular and unlabored, clear to auscultation bilaterally. GI: Soft, nontender, nondistended, BS + x 4. MS: No deformity or atrophy. Skin: Warm and dry, no rash. Neuro:  Strength and sensation are intact. Psych: Normal affect.  Accessory Clinical Findings    ECG personally reviewed by me today - SB 58 bpm with no acute ST/T wave changes - no acute changes.  Assessment & Plan    1. HTN - BP well controlled. Continue present antihypertensive regimen of Benazepril 5mg  daily. No lightheadedness, dizziness, near syncope.   2. Palpitations/atrial tachycardia - Reports palpitations only if she misses a few days of Metoprolol. Symptoms are stable. A refill of Metoprolol Tartrate 25mg  twice daily was provided. Continue to avoid caffeine.   3. Aortic atherosclerosis - Continue Aspirin 81mg  daily. Has declined statin in the past. Lipid panel 02/02/19 with total 162, HDL 51, LDL 70, triglycerides 171.    4. Hypertriglyceridemia -  Lipid panel 02/02/19 with triglycerides 171. We discussed dietary changes including reducing carbohydrates and sugars.   5. Hypothyroidism - Follows with PCP. Thyroid panel 04/29/19 normal. Continue levothyroxine.   Disposition: Follow up in 1 year(s) with Dr. Rockey Situ or APP   Loel Dubonnet, NP 08/10/2019, 2:13 PM

## 2019-08-09 NOTE — Telephone Encounter (Signed)
Scheduled 2/24 with Shea Evans

## 2019-08-10 ENCOUNTER — Encounter: Payer: Self-pay | Admitting: Family

## 2019-08-10 ENCOUNTER — Ambulatory Visit (INDEPENDENT_AMBULATORY_CARE_PROVIDER_SITE_OTHER): Payer: Medicare Other | Admitting: Family

## 2019-08-10 ENCOUNTER — Other Ambulatory Visit: Payer: Self-pay

## 2019-08-10 VITALS — BP 110/60 | HR 58 | Ht 68.0 in | Wt 243.0 lb

## 2019-08-10 DIAGNOSIS — I471 Supraventricular tachycardia: Secondary | ICD-10-CM

## 2019-08-10 DIAGNOSIS — I700 Atherosclerosis of aorta: Secondary | ICD-10-CM | POA: Diagnosis not present

## 2019-08-10 DIAGNOSIS — R002 Palpitations: Secondary | ICD-10-CM | POA: Diagnosis not present

## 2019-08-10 DIAGNOSIS — I10 Essential (primary) hypertension: Secondary | ICD-10-CM | POA: Diagnosis not present

## 2019-08-10 DIAGNOSIS — E781 Pure hyperglyceridemia: Secondary | ICD-10-CM

## 2019-08-10 MED ORDER — METOPROLOL TARTRATE 25 MG PO TABS: ORAL_TABLET | ORAL | 3 refills | Status: DC

## 2019-08-10 NOTE — Patient Instructions (Addendum)
Medication Instructions:  No medication changes today.  Metoprolol refill sent today.  *If you need a refill on your cardiac medications before your next appointment, please call your pharmacy*  Lab Work: No lab work today.  If you have labs (blood work) drawn today and your tests are completely normal, you will receive your results only by: Marland Kitchen MyChart Message (if you have MyChart) OR . A paper copy in the mail If you have any lab test that is abnormal or we need to change your treatment, we will call you to review the results.  Testing/Procedures: You had an EKG today shows sinus bradycardia which is a good result.  Follow-Up: At [facility], [place], you and your health needs are our priority.  As part of our continuing mission to provide you with exceptional heart care, we have created designated Provider Care Teams.  These Care Teams include your primary Cardiologist (physician) and Advanced Practice Providers (APPs -  Physician Assistants and Nurse Practitioners) who all work together to provide you with the care you need, when you need it.  Your next appointment:   1 year(s)  The format for your next appointment:   In Person  Provider:    You may see Julien Nordmann, MD or one of the following Advanced Practice Providers on your designated Care Team:    Nicolasa Ducking, NP  Eula Listen, PA-C  Marisue Ivan, PA-C  Other Instructions  Keep up the good work staying active!  Let us know if you have more issues with your atrial tachycardia or palpitations.   For your lower extremity edema, would recommend keeping your legs elevated when sitting. You can try diabetic socks or compression stockings. Also recommend low sodium (salt) diet.

## 2019-08-14 ENCOUNTER — Other Ambulatory Visit: Payer: Self-pay | Admitting: Family Medicine

## 2019-08-15 NOTE — Telephone Encounter (Signed)
Requested Prescriptions  Pending Prescriptions Disp Refills  . citalopram (CELEXA) 40 MG tablet [Pharmacy Med Name: Citalopram Hydrobromide 40 MG Oral Tablet] 90 tablet 0    Sig: Take 1 tablet by mouth once daily     Psychiatry:  Antidepressants - SSRI Passed - 08/14/2019  8:14 PM      Passed - Completed PHQ-2 or PHQ-9 in the last 360 days.      Passed - Valid encounter within last 6 months    Recent Outpatient Visits          3 weeks ago Essential hypertension   [facility] [place], Marzella Schlein, MD   3 months ago Cough   [facility] [place], Marzella Schlein, MD   6 months ago Essential hypertension   [facility] [place], Marzella Schlein, MD   6 months ago Fall, initial encounter   [facility], Lavella Hammock, PA-C   6 months ago [employer] preventive visit   [facility], Marzella Schlein, MD

## 2019-08-18 ENCOUNTER — Encounter: Payer: Self-pay | Admitting: Family Medicine

## 2019-08-18 MED ORDER — FUROSEMIDE 20 MG PO TABS: 20.0000 mg | ORAL_TABLET | Freq: Two times a day (BID) | ORAL | 1 refills | Status: DC

## 2019-08-18 NOTE — Telephone Encounter (Signed)
OK to change the Rx of Lasix to reflect 20mg  x2 pills daily, instead of 40mg  pill

## 2019-09-11 ENCOUNTER — Other Ambulatory Visit: Payer: Self-pay | Admitting: Cardiovascular Disease

## 2019-09-11 DIAGNOSIS — I471 Supraventricular tachycardia: Secondary | ICD-10-CM

## 2019-10-02 ENCOUNTER — Other Ambulatory Visit: Payer: Self-pay | Admitting: Family Medicine

## 2019-10-02 NOTE — Telephone Encounter (Signed)
Requested Prescriptions  Pending Prescriptions Disp Refills  . benazepril (LOTENSIN) 10 MG tablet [Pharmacy Med Name: Benazepril HCl 10 MG Oral Tablet] 90 tablet 1    Sig: Take 1 tablet by mouth once daily     Cardiovascular:  ACE Inhibitors Passed - 10/02/2019  4:44 PM      Passed - Cr in normal range and within 180 days    Creatinine  Date Value Ref Range Status  08/27/2013 0.91 0.60 - 1.30 mg/dL Final   Creat  Date Value Ref Range Status  09/20/2011 1.02  Final   Creatinine, Ser  Date Value Ref Range Status  07/22/2019 0.89 0.57 - 1.00 mg/dL Final   Creatinine,U  Date Value Ref Range Status  09/17/2012 42.3 mg/dL Final         Passed - K in normal range and within 180 days    Potassium  Date Value Ref Range Status  07/22/2019 4.5 3.5 - 5.2 mmol/L Final  08/27/2013 4.5 3.5 - 5.1 mmol/L Final         Passed - Patient is not pregnant      Passed - Last BP in normal range    BP Readings from Last 1 Encounters:  08/10/19 110/60         Passed - Valid encounter within last 6 months    Recent Outpatient Visits          2 months ago Essential hypertension   [facility] [place], Marzella Schlein, MD   5 months ago Cough   [facility] [place], Marzella Schlein, MD   8 months ago Essential hypertension   [facility] [place] [facility] [place], Marzella Schlein, MD   8 months ago Fall, initial encounter   [employer], [place], PA-C   8 months ago [facility] [employer] preventive visit   [facility], Marzella Schlein, MD

## 2019-10-26 DIAGNOSIS — Z859 Personal history of malignant neoplasm, unspecified: Secondary | ICD-10-CM | POA: Diagnosis not present

## 2019-10-26 DIAGNOSIS — Z872 Personal history of diseases of the skin and subcutaneous tissue: Secondary | ICD-10-CM | POA: Diagnosis not present

## 2019-10-26 DIAGNOSIS — Z85828 Personal history of other malignant neoplasm of skin: Secondary | ICD-10-CM | POA: Diagnosis not present

## 2019-10-26 DIAGNOSIS — L578 Other skin changes due to chronic exposure to nonionizing radiation: Secondary | ICD-10-CM | POA: Diagnosis not present

## 2019-10-26 DIAGNOSIS — L570 Actinic keratosis: Secondary | ICD-10-CM | POA: Diagnosis not present

## 2019-10-26 DIAGNOSIS — Z86018 Personal history of other benign neoplasm: Secondary | ICD-10-CM | POA: Diagnosis not present

## 2019-10-27 ENCOUNTER — Encounter: Payer: Self-pay | Admitting: Family Medicine

## 2019-10-27 MED ORDER — FLUTICASONE-SALMETEROL 100-50 MCG/DOSE IN AEPB: 1.0000 | INHALATION_SPRAY | Freq: Two times a day (BID) | RESPIRATORY_TRACT | 5 refills | Status: DC

## 2019-10-31 ENCOUNTER — Telehealth (INDEPENDENT_AMBULATORY_CARE_PROVIDER_SITE_OTHER): Payer: Medicare Other | Admitting: Family Medicine

## 2019-10-31 ENCOUNTER — Telehealth: Payer: Self-pay | Admitting: Family Medicine

## 2019-10-31 ENCOUNTER — Encounter: Payer: Self-pay | Admitting: Family Medicine

## 2019-10-31 DIAGNOSIS — J4521 Mild intermittent asthma with (acute) exacerbation: Secondary | ICD-10-CM

## 2019-10-31 MED ORDER — PREDNISONE 10 MG PO TABS: ORAL_TABLET | ORAL | 0 refills | Status: DC

## 2019-10-31 NOTE — Telephone Encounter (Incomplete)
{  [facility] TELEPHONE NOTE:[phone]}  Patient waited for a virtual appointment, the phone rang but when she answered no one was on the phone.

## 2019-10-31 NOTE — Telephone Encounter (Signed)
Needs evaluation.  Can be in office if cough for >10 days and afebrile, otherwise virtual.

## 2019-10-31 NOTE — Progress Notes (Signed)
MyChart Video Visit    Virtual Visit via Video Note   This visit type was conducted due to national recommendations for restrictions regarding the COVID-19 Pandemic (e.g. social distancing) in an effort to limit this patient's exposure and mitigate transmission in our community. This patient is at least at moderate risk for complications without adequate follow up. This format is felt to be most appropriate for this patient at this time. Physical exam was limited by quality of the video and audio technology used for the visit.    Patient location: home Provider location: [facility] Persons involved in the visit: patient, provider  Interactive audio and video communications were attempted, although failed due to patient's inability to connect to video. Continued visit with audio only interaction with patient agreement.   Patient: Claire Strong   DOB: [birth year]   59 y.o. Female  MRN: [id number] Visit Date: 10/31/2019  Today's healthcare provider: Shirlee Latch, MD   Chief Complaint  Patient presents with  . Asthma  . Cough   Subjective    Cough This is a recurrent problem. The current episode started in the past 7 days. The problem has been gradually worsening. The cough is non-productive. Associated symptoms include shortness of breath and wheezing. Pertinent negatives include no ear pain, heartburn, hemoptysis, postnasal drip, rhinorrhea or sore throat. She has tried steroid inhaler for the symptoms. The treatment provided mild relief. Her past medical history is significant for asthma and environmental allergies.  Breathing Problem She complains of cough, difficulty breathing, shortness of breath and wheezing. There is no hemoptysis. Pertinent negatives include no ear pain, heartburn, postnasal drip, rhinorrhea, sneezing or sore throat. Her past medical history is significant for asthma.   x5 days Feels like previous episodes of bronchitis Albuterol  inhaler not helping Using Advair with good compliance  Non-productive cough, no fevers + SOB  Has had both COVID vaccines >1 month ago No known sick contacts  Patient Active Problem List   Diagnosis Date Noted  . Moderate episode of recurrent major depressive disorder (HCC) 07/22/2019  . GAD (generalized anxiety disorder) 07/22/2019  . Morbid obesity (HCC) 01/19/2019  . Eosinophilic esophagitis   . Benign neoplasm of ascending colon   . Benign neoplasm of cecum   . Diverticulosis of large intestine without diverticulitis   . History of gastric bypass 06/01/2018  . Esophageal dysphagia 05/06/2018  . Secondary hyperparathyroidism, non-renal (HCC) 05/02/2017  . CKD (chronic kidney disease) stage 2, GFR 60-89 ml/min 05/02/2017  . Primary localized osteoarthritis of left hip 03/19/2017  . History of DVT (deep vein thrombosis) 08/25/2016  . PAD (peripheral artery disease) (HCC) 08/25/2016  . Chronic venous insufficiency 08/25/2016  . Iron deficiency anemia 03/25/2016  . Primary osteoarthritis of right hip 11/06/2015  . History of kidney stones 09/03/2015  . Osteoarthritis   . Fibromyalgia 02/14/2014  . Atrial tachycardia (HCC) 01/25/2013  . Hypothyroidism, postsurgical 02/25/2012  . Edema 01/28/2012  . Headache(784.0) 09/25/2011  . Bronchial asthma   . Hot flashes 03/18/2011  . Nasal septal perforation   . Horseshoe kidney   . HTN (hypertension)   . Lupus anticoagulant disorder (HCC)   . 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   Past Medical History:  Diagnosis Date  . Anxiety   . Basal cell carcinoma    nose  . Bleeding disorder (HCC)   . Bronchial asthma   . Cancer (HCC)    melanoma; right knee  . Cavovarus deformity of foot    bilateral,  with L 5th bunionette (Dr. Gigi Gin ortho)  . Chronic venous insufficiency 2007   s/p vein stripping and laser ablation  . Depression   . Diverticulosis    mild by colonoscopy  . History of DVT (deep vein thrombosis)    DVTs after 1st  pregnancy, not on AC 2/2 bleeding ulcer, greenfield filter in place  . History of gastric ulcer 2009  . History of kidney stones   . Horseshoe kidney    sole, R kidney damage  . HTN (hypertension)   . Hypothyroidism   . Kidney stones    s/p renal hematoma after lithotripsy on right  . Lupus anticoagulant disorder ([place])   . Multinodular goiter    s/p thyroidectomy  . Nasal septal perforation    chronic, ENT rec avoid antihistamine, INS  . Osteoarthritis    ?FM by rheum  . Overweight   . Post-surgical hypothyroidism    for multinodular goiter  . 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 Frutoso Chase  . Seasonal allergies    allergy shots, singulair  . 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 Tachycardia    Social History   Tobacco Use  . Smoking status: Former Smoker    Packs/day: 0.25    Types: Cigarettes    Quit date: 06/16/2004    Years since quitting: 15.3  . Smokeless tobacco: Never Used  . Tobacco comment: Socially-no longer  Substance Use Topics  . Alcohol use: Yes    Alcohol/week: 1.0 standard drinks    Types: 1 Cans of beer per week    Comment: Occasionally-beer  . Drug use: No   Allergies  Allergen Reactions  . Sulfa Antibiotics Shortness Of Breath  . Sulfur Anaphylaxis  . Cefuroxime Axetil Other (See Comments)    Bleeding ulcer  . Nutritional Supplements Swelling    Unsure which  . Other     All narcotics   . Oxycodone-Acetaminophen Nausea And Vomiting  . Betadine [Povidone Iodine] Rash    Medications: Outpatient Medications Prior to Visit  Medication Sig  . acetaminophen (TYLENOL) 500 MG tablet Take 1,000 mg by mouth 2 (two) times daily.   . ACETAMINOPHEN-BUTALBITAL 50-325 MG TABS Take 1 tablet by mouth at bedtime as needed (sleep). Reported on 09/03/2015 3 times a week  . albuterol (PROVENTIL HFA;VENTOLIN HFA) 108 (90 Base) MCG/ACT inhaler Inhale 2 puffs into the lungs every 6 (six) hours as needed for wheezing or shortness of breath.  Marland Kitchen aspirin 81 MG tablet  Take 81 mg by mouth daily.  . benazepril (LOTENSIN) 10 MG tablet Take 1 tablet by mouth once daily  . bisacodyl (DULCOLAX) 5 MG EC tablet Take 5 mg by mouth daily as needed for moderate constipation.  Marland Kitchen buPROPion (WELLBUTRIN SR) 150 MG 12 hr tablet Take 1 tablet (150 mg total) by mouth 2 (two) times daily. (Patient taking differently: Take 150 mg by mouth daily. )  . calcium carbonate (CALCIUM 600) 600 MG TABS tablet Take 1 tablet (600 mg total) 2 (two) times daily with a meal by mouth.  . cetirizine (ZYRTEC) 10 MG tablet Take 10 mg by mouth daily.  . Cholecalciferol (VITAMIN D3) 1000 UNITS tablet Take 2,000 Units by mouth daily.   . citalopram (CELEXA) 40 MG tablet Take 1 tablet by mouth once daily  . cyclobenzaprine (FLEXERIL) 10 MG tablet cyclobenzaprine 10 mg tablet  . EPINEPHrine (EPI-PEN) 0.3 mg/0.3 mL DEVI Inject 0.3 mLs (0.3 mg total) into the muscle once. (Patient taking differently:  Inject 0.3 mg into the muscle once. )  . estradiol (ESTRACE) 2 MG tablet Take 1 tablet by mouth daily.   . Fluticasone-Salmeterol (ADVAIR DISKUS) 100-50 MCG/DOSE AEPB Inhale 1 puff into the lungs 2 (two) times daily.  . furosemide (LASIX) 20 MG tablet Take 1 tablet (20 mg total) by mouth 2 (two) times daily.  Marland Kitchen levothyroxine (SYNTHROID) 150 MCG tablet Take by mouth.  . levothyroxine (SYNTHROID) 175 MCG tablet TAKE 1 TABLET BY MOUTH EVERY OTHER DAY (ALTERNATE WITH EVERY OTHER DAY)  . liothyronine (CYTOMEL) 5 MCG tablet liothyronine 5 mcg tablet  . metoprolol tartrate (LOPRESSOR) 25 MG tablet TAKE 1 TABLET BY MOUTH TWICE DAILY.  . montelukast (SINGULAIR) 10 MG tablet Take 10 mg by mouth every morning.   . pantoprazole (PROTONIX) 20 MG tablet Take 1 tablet by mouth twice daily  . solifenacin (VESICARE) 5 MG tablet Take by mouth.  . SUMAtriptan (IMITREX) 50 MG tablet Take 50 mg by mouth every 2 (two) hours as needed for migraine. May repeat in 2 hours if headache persists or recurs.  . traZODone  (DESYREL) 50 MG tablet TAKE 1/2 TO 1 (ONE-HALF TO ONE) TABLET BY MOUTH AT BEDTIME AS NEEDED FOR SLEEP  . Turmeric 500 MG CAPS Take 1 capsule by mouth 2 (two) times daily.  . vitamin B-12 (CYANOCOBALAMIN) 500 MCG tablet Take 500 mcg by mouth daily.  Marland Kitchen zinc gluconate 50 MG tablet Take 50 mg by mouth daily.  . ondansetron (ZOFRAN) 4 MG tablet Take 1 tablet (4 mg total) by mouth every 8 (eight) hours as needed for nausea or vomiting.   No facility-administered medications prior to visit.    Review of Systems  Constitutional: Negative.   HENT: Positive for congestion. Negative for ear discharge, ear pain, postnasal drip, rhinorrhea, sinus pressure, sinus pain, sneezing, sore throat and voice change.   Eyes: Negative.   Respiratory: Positive for cough, shortness of breath and wheezing. Negative for apnea, hemoptysis, choking, chest tightness and stridor.   Gastrointestinal: Negative.  Negative for heartburn.  Allergic/Immunologic: Positive for environmental allergies.      Objective    There were no vitals taken for this visit.   Physical Exam  Speaking in full sentences in no apparent distress Intermittent coughing   Assessment & Plan     1. Mild intermittent asthma with acute exacerbation -Recurrent problem -Typically has well-controlled asthma, but seasonally can develop exacerbations -Increased coughing and wheezing c/w asthma exacerbation -Continue albuterol as needed -Continue Advair twice daily -Start prednisone burst and taper - if worsens consider CXR - return precautions discussed   Return if symptoms worsen or fail to improve.     I discussed the assessment and treatment plan with the patient. The patient was provided an opportunity to ask questions and all were answered. The patient agreed with the plan and demonstrated an understanding of the instructions.   The patient was advised to call back or seek an in-person evaluation if the symptoms worsen or if the  condition fails to improve as anticipated.   I, Shirlee Latch, MD, have reviewed all documentation for this visit. The documentation on 10/31/19 for the exam, diagnosis, procedures, and orders are all accurate and complete.   Ephriam Turman, Marzella Schlein, MD, MPH [facility]

## 2019-11-01 DIAGNOSIS — M79672 Pain in left foot: Secondary | ICD-10-CM | POA: Diagnosis not present

## 2019-11-01 DIAGNOSIS — M19072 Primary osteoarthritis, left ankle and foot: Secondary | ICD-10-CM | POA: Diagnosis not present

## 2019-11-01 DIAGNOSIS — D2372 Other benign neoplasm of skin of left lower limb, including hip: Secondary | ICD-10-CM | POA: Diagnosis not present

## 2019-11-10 ENCOUNTER — Other Ambulatory Visit
Admission: RE | Admit: 2019-11-10 | Discharge: 2019-11-10 | Disposition: A | Discharge status: Home / Self Care | Payer: Medicare Other | Attending: Otolaryngology | Admitting: Otolaryngology

## 2019-11-10 ENCOUNTER — Other Ambulatory Visit: Payer: Self-pay

## 2019-11-10 ENCOUNTER — Encounter: Payer: Self-pay | Admitting: Family Medicine

## 2019-11-10 DIAGNOSIS — E890 Postprocedural hypothyroidism: Secondary | ICD-10-CM | POA: Insufficient documentation

## 2019-11-10 LAB — T4, FREE: Free T4: 0.88 ng/dL (ref 0.61–1.12)

## 2019-11-10 LAB — TSH: TSH: 0.321 u[IU]/mL — ABNORMAL LOW (ref 0.350–4.500)

## 2019-11-11 LAB — T3, FREE: T3, Free: 2.6 pg/mL (ref 2.0–4.4)

## 2019-11-11 MED ORDER — PREDNISONE 10 MG PO TABS: ORAL_TABLET | ORAL | 0 refills | Status: DC

## 2019-11-11 NOTE — Telephone Encounter (Signed)
Please reorder prednisone course

## 2019-11-15 DIAGNOSIS — D2372 Other benign neoplasm of skin of left lower limb, including hip: Secondary | ICD-10-CM | POA: Diagnosis not present

## 2019-11-15 DIAGNOSIS — M2011 Hallux valgus (acquired), right foot: Secondary | ICD-10-CM | POA: Diagnosis not present

## 2019-11-15 DIAGNOSIS — M79672 Pain in left foot: Secondary | ICD-10-CM | POA: Diagnosis not present

## 2019-11-17 DIAGNOSIS — J301 Allergic rhinitis due to pollen: Secondary | ICD-10-CM | POA: Diagnosis not present

## 2019-11-17 DIAGNOSIS — E890 Postprocedural hypothyroidism: Secondary | ICD-10-CM | POA: Diagnosis not present

## 2019-11-19 ENCOUNTER — Other Ambulatory Visit: Payer: Self-pay | Admitting: Family Medicine

## 2019-11-19 NOTE — Telephone Encounter (Signed)
Requested Prescriptions  Pending Prescriptions Disp Refills   buPROPion (WELLBUTRIN SR) 150 MG 12 hr tablet [Pharmacy Med Name: buPROPion HCl ER (SR) 150 MG Oral Tablet Extended Release 12 Hour] 180 tablet 0    Sig: Take 1 tablet by mouth twice daily     Psychiatry: Antidepressants - bupropion Passed - 11/19/2019 10:00 AM      Passed - Completed PHQ-2 or PHQ-9 in the last 360 days.      Passed - Last BP in normal range    BP Readings from Last 1 Encounters:  08/10/19 110/60         Passed - Valid encounter within last 6 months    Recent Outpatient Visits          2 weeks ago Mild intermittent asthma with acute exacerbation   [facility] [place], Marzella Schlein, MD   4 months ago Essential hypertension   [facility] [place], Marzella Schlein, MD   6 months ago Cough   [facility] [place], Marzella Schlein, MD   9 months ago Essential hypertension   [facility] [name], MD   9 months ago Fall, initial encounter   [facility], Lavella Hammock, [place]

## 2019-11-20 ENCOUNTER — Encounter: Payer: Self-pay | Admitting: Family Medicine

## 2019-11-20 DIAGNOSIS — J4521 Mild intermittent asthma with (acute) exacerbation: Secondary | ICD-10-CM

## 2019-11-20 DIAGNOSIS — R062 Wheezing: Secondary | ICD-10-CM

## 2019-11-20 DIAGNOSIS — R059 Cough, unspecified: Secondary | ICD-10-CM

## 2019-11-21 NOTE — Telephone Encounter (Signed)
Please order chest Xray at [facility]. See visit from 5/17

## 2019-11-22 ENCOUNTER — Other Ambulatory Visit: Payer: Self-pay

## 2019-11-22 ENCOUNTER — Ambulatory Visit
Admission: RE | Admit: 2019-11-22 | Discharge: 2019-11-22 | Disposition: A | Discharge status: Home / Self Care | Payer: Medicare Other | Attending: Family Medicine | Admitting: Family Medicine

## 2019-11-22 ENCOUNTER — Ambulatory Visit
Admission: RE | Admit: 2019-11-22 | Discharge: 2019-11-22 | Disposition: A | Discharge status: Home / Self Care | Payer: Medicare Other | Source: Ambulatory Visit | Attending: Family Medicine | Admitting: Family Medicine

## 2019-11-22 DIAGNOSIS — R062 Wheezing: Secondary | ICD-10-CM | POA: Diagnosis not present

## 2019-11-22 DIAGNOSIS — J4521 Mild intermittent asthma with (acute) exacerbation: Secondary | ICD-10-CM | POA: Insufficient documentation

## 2019-11-22 DIAGNOSIS — R05 Cough: Secondary | ICD-10-CM | POA: Diagnosis not present

## 2019-11-23 ENCOUNTER — Telehealth: Payer: Self-pay

## 2019-11-23 ENCOUNTER — Encounter: Payer: Self-pay | Admitting: Family Medicine

## 2019-11-23 NOTE — Telephone Encounter (Signed)
Result Communications   Result Notes and Comments to Patient Comment seen by patient Claire Strong on 11/23/2019 9:26 AM EDT

## 2019-11-23 NOTE — Telephone Encounter (Signed)
-----   Message from [name], MD sent at 11/23/2019  9:24 AM EDT ----- Normal chest XRay

## 2019-11-26 ENCOUNTER — Other Ambulatory Visit: Payer: Self-pay | Admitting: Family Medicine

## 2019-11-26 NOTE — Telephone Encounter (Signed)
Requested Prescriptions  Pending Prescriptions Disp Refills   citalopram (CELEXA) 40 MG tablet [Pharmacy Med Name: Citalopram Hydrobromide 40 MG Oral Tablet] 90 tablet 1    Sig: Take 1 tablet by mouth once daily     Psychiatry:  Antidepressants - SSRI Passed - 11/26/2019 10:49 AM      Passed - Completed PHQ-2 or PHQ-9 in the last 360 days.      Passed - Valid encounter within last 6 months    Recent Outpatient Visits          3 weeks ago Mild intermittent asthma with acute exacerbation   [facility] [place], Marzella Schlein, MD   4 months ago Essential hypertension   [facility] [place], Marzella Schlein, MD   7 months ago Cough   [facility] [place], Marzella Schlein, MD   9 months ago Essential hypertension   [facility] [name], MD   9 months ago Fall, initial encounter   [facility], Lavella Hammock, [place]

## 2019-11-28 NOTE — Telephone Encounter (Signed)
OK to schedule patient.

## 2019-12-02 ENCOUNTER — Inpatient Hospital Stay: Payer: Medicare Other | Attending: Oncology

## 2019-12-02 ENCOUNTER — Other Ambulatory Visit: Payer: Self-pay

## 2019-12-02 DIAGNOSIS — R5383 Other fatigue: Secondary | ICD-10-CM | POA: Insufficient documentation

## 2019-12-02 DIAGNOSIS — Z801 Family history of malignant neoplasm of trachea, bronchus and lung: Secondary | ICD-10-CM | POA: Diagnosis not present

## 2019-12-02 DIAGNOSIS — E039 Hypothyroidism, unspecified: Secondary | ICD-10-CM | POA: Diagnosis not present

## 2019-12-02 DIAGNOSIS — Z86718 Personal history of other venous thrombosis and embolism: Secondary | ICD-10-CM | POA: Diagnosis not present

## 2019-12-02 DIAGNOSIS — Z9884 Bariatric surgery status: Secondary | ICD-10-CM | POA: Diagnosis not present

## 2019-12-02 DIAGNOSIS — R1310 Dysphagia, unspecified: Secondary | ICD-10-CM | POA: Diagnosis not present

## 2019-12-02 DIAGNOSIS — Z87891 Personal history of nicotine dependence: Secondary | ICD-10-CM | POA: Diagnosis not present

## 2019-12-02 DIAGNOSIS — D509 Iron deficiency anemia, unspecified: Secondary | ICD-10-CM | POA: Diagnosis not present

## 2019-12-02 DIAGNOSIS — Z818 Family history of other mental and behavioral disorders: Secondary | ICD-10-CM | POA: Insufficient documentation

## 2019-12-02 DIAGNOSIS — Z808 Family history of malignant neoplasm of other organs or systems: Secondary | ICD-10-CM | POA: Insufficient documentation

## 2019-12-02 DIAGNOSIS — Z811 Family history of alcohol abuse and dependence: Secondary | ICD-10-CM | POA: Diagnosis not present

## 2019-12-02 DIAGNOSIS — R5382 Chronic fatigue, unspecified: Secondary | ICD-10-CM | POA: Insufficient documentation

## 2019-12-02 DIAGNOSIS — Z7289 Other problems related to lifestyle: Secondary | ICD-10-CM | POA: Insufficient documentation

## 2019-12-02 DIAGNOSIS — Z8249 Family history of ischemic heart disease and other diseases of the circulatory system: Secondary | ICD-10-CM | POA: Diagnosis not present

## 2019-12-02 DIAGNOSIS — Z79899 Other long term (current) drug therapy: Secondary | ICD-10-CM | POA: Insufficient documentation

## 2019-12-02 DIAGNOSIS — I10 Essential (primary) hypertension: Secondary | ICD-10-CM | POA: Diagnosis not present

## 2019-12-02 DIAGNOSIS — Z83438 Family history of other disorder of lipoprotein metabolism and other lipidemia: Secondary | ICD-10-CM | POA: Insufficient documentation

## 2019-12-02 DIAGNOSIS — Z833 Family history of diabetes mellitus: Secondary | ICD-10-CM | POA: Diagnosis not present

## 2019-12-02 DIAGNOSIS — Z823 Family history of stroke: Secondary | ICD-10-CM | POA: Insufficient documentation

## 2019-12-02 DIAGNOSIS — D508 Other iron deficiency anemias: Secondary | ICD-10-CM

## 2019-12-02 LAB — CBC WITH DIFFERENTIAL/PLATELET
Abs Immature Granulocytes: 0.01 10*3/uL (ref 0.00–0.07)
Basophils Absolute: 0 10*3/uL (ref 0.0–0.1)
Basophils Relative: 0 %
Eosinophils Absolute: 0.1 10*3/uL (ref 0.0–0.5)
Eosinophils Relative: 2 %
HCT: 37.7 % (ref 36.0–46.0)
Hemoglobin: 12.9 g/dL (ref 12.0–15.0)
Immature Granulocytes: 0 %
Lymphocytes Relative: 36 %
Lymphs Abs: 1.9 10*3/uL (ref 0.7–4.0)
MCH: 31.1 pg (ref 26.0–34.0)
MCHC: 34.2 g/dL (ref 30.0–36.0)
MCV: 90.8 fL (ref 80.0–100.0)
Monocytes Absolute: 0.3 10*3/uL (ref 0.1–1.0)
Monocytes Relative: 6 %
Neutro Abs: 2.9 10*3/uL (ref 1.7–7.7)
Neutrophils Relative %: 56 %
Platelets: 183 10*3/uL (ref 150–400)
RBC: 4.15 MIL/uL (ref 3.87–5.11)
RDW: 12 % (ref 11.5–15.5)
WBC: 5.2 10*3/uL (ref 4.0–10.5)
nRBC: 0 % (ref 0.0–0.2)

## 2019-12-02 LAB — IRON AND TIBC
Iron: 89 ug/dL (ref 28–170)
Saturation Ratios: 24 % (ref 10.4–31.8)
TIBC: 377 ug/dL (ref 250–450)
UIBC: 288 ug/dL

## 2019-12-02 LAB — VITAMIN B12: Vitamin B-12: 1627 pg/mL — ABNORMAL HIGH (ref 180–914)

## 2019-12-02 LAB — FERRITIN: Ferritin: 51 ng/mL (ref 11–307)

## 2019-12-05 ENCOUNTER — Encounter: Payer: Self-pay | Admitting: Oncology

## 2019-12-05 ENCOUNTER — Other Ambulatory Visit: Payer: Self-pay

## 2019-12-05 ENCOUNTER — Inpatient Hospital Stay: Payer: Medicare Other | Admitting: Oncology

## 2019-12-05 VITALS — BP 103/79 | HR 56 | Temp 96.5°F | Resp 18 | Wt 230.8 lb

## 2019-12-05 DIAGNOSIS — Z83438 Family history of other disorder of lipoprotein metabolism and other lipidemia: Secondary | ICD-10-CM | POA: Diagnosis not present

## 2019-12-05 DIAGNOSIS — R1310 Dysphagia, unspecified: Secondary | ICD-10-CM | POA: Diagnosis not present

## 2019-12-05 DIAGNOSIS — R5383 Other fatigue: Secondary | ICD-10-CM | POA: Diagnosis not present

## 2019-12-05 DIAGNOSIS — Z823 Family history of stroke: Secondary | ICD-10-CM | POA: Diagnosis not present

## 2019-12-05 DIAGNOSIS — R5382 Chronic fatigue, unspecified: Secondary | ICD-10-CM | POA: Diagnosis not present

## 2019-12-05 DIAGNOSIS — Z801 Family history of malignant neoplasm of trachea, bronchus and lung: Secondary | ICD-10-CM | POA: Diagnosis not present

## 2019-12-05 DIAGNOSIS — Z87891 Personal history of nicotine dependence: Secondary | ICD-10-CM | POA: Diagnosis not present

## 2019-12-05 DIAGNOSIS — I10 Essential (primary) hypertension: Secondary | ICD-10-CM | POA: Diagnosis not present

## 2019-12-05 DIAGNOSIS — Z8249 Family history of ischemic heart disease and other diseases of the circulatory system: Secondary | ICD-10-CM | POA: Diagnosis not present

## 2019-12-05 DIAGNOSIS — Z9884 Bariatric surgery status: Secondary | ICD-10-CM | POA: Diagnosis not present

## 2019-12-05 DIAGNOSIS — Z7289 Other problems related to lifestyle: Secondary | ICD-10-CM | POA: Diagnosis not present

## 2019-12-05 DIAGNOSIS — E039 Hypothyroidism, unspecified: Secondary | ICD-10-CM | POA: Diagnosis not present

## 2019-12-05 DIAGNOSIS — Z833 Family history of diabetes mellitus: Secondary | ICD-10-CM | POA: Diagnosis not present

## 2019-12-05 DIAGNOSIS — Z86718 Personal history of other venous thrombosis and embolism: Secondary | ICD-10-CM | POA: Diagnosis not present

## 2019-12-05 DIAGNOSIS — D508 Other iron deficiency anemias: Secondary | ICD-10-CM

## 2019-12-05 DIAGNOSIS — Z79899 Other long term (current) drug therapy: Secondary | ICD-10-CM | POA: Diagnosis not present

## 2019-12-05 DIAGNOSIS — D509 Iron deficiency anemia, unspecified: Secondary | ICD-10-CM | POA: Diagnosis not present

## 2019-12-05 DIAGNOSIS — Z808 Family history of malignant neoplasm of other organs or systems: Secondary | ICD-10-CM | POA: Diagnosis not present

## 2019-12-05 MED ORDER — VITAMIN B-12 500 MCG PO TABS: 500.0000 ug | ORAL_TABLET | Freq: Every day | ORAL | 1 refills | Status: DC

## 2019-12-06 NOTE — Progress Notes (Signed)
Hematology/Oncology progress note [facility] [phone] [phone]   Patient Care Team: Virginia Crews, MD as PCP - General (Family Medicine) Minna Merritts, MD as PCP - Cardiology (Cardiology) Samara Deist, DPM as Referring Physician (Podiatry) Samara Deist, DPM as Referring Physician (Podiatry) Schnier, Dolores Lory, MD (Vascular Surgery)  REFERRING PROVIDER: Dr.Tahiliani CHIEF COMPLAINTS/REASON FOR VISIT:  Evaluation of iron deficiency anemia  HISTORY OF PRESENTING ILLNESS:  Claire Strong is a  59 y.o.  female with PMH listed below who was referred to me for evaluation of iron deficiency anemia Patient was recently referred to GI for evaluation of 3 months history of dysphagia with solid food.  Her previous labs showed low normal ferritin level at 15.Marland Kitchen Referred to me for further evaluation of iron deficiency.  She has a history of gastric bypass in 2006.  Previous colonoscopy showed hyperplastic polyps.   Associated signs and symptoms: Patient reports fatigue.  Denies SOB with exertion.  Denies weight loss, easy bruising, hematochezia, hemoptysis, hematuria. Context: History of gastric bypass History of iron deficiency: Yes, reports history of IV iron infusion.  Rectal bleeding: deneis Menstrual bleeding/ Vaginal bleeding : hysterotomy at age of 36  Hematemesis or hemoptysis : denies Last endoscopy:2006 Fatigue: Yes.  SOB: deneis   # Also history of DVT, after 1st pregnancy, history of positive lupus anticoagulant, per patient, repeat antibody panel was negative. Currently not on anticoagulation, on Aspirin.   History of basal cell carcinoma.   # EGD and colonoscopy on 07/01/2018 EGD shows reflux esophagitis.  No atypia or malignancy. Colonoscopy showed tubular adenoma.  Negative for high-grade dysplasia or malignancy. INTERVAL HISTORY Claire Strong is a 59 y.o. female who has above history reviewed by me today  presents for follow up visit for management of iron deficiency anemia,  Problems and complaints are listed below: Patient has previously received IV Feraheme treatment. Chronic fatigue.  Patient has hypothyroidism and per patient her levothyroxine dose was recently adjusted  Review of Systems  Constitutional: Positive for fatigue. Negative for appetite change, chills and fever.  HENT:   Negative for hearing loss and voice change.   Eyes: Negative for eye problems.  Respiratory: Negative for chest tightness and cough.   Cardiovascular: Negative for chest pain.  Gastrointestinal: Negative for abdominal distention, abdominal pain and blood in stool.  Endocrine: Negative for hot flashes.  Genitourinary: Negative for difficulty urinating and frequency.   Musculoskeletal: Negative for arthralgias.  Skin: Negative for itching and rash.  Neurological: Negative for extremity weakness.  Hematological: Negative for adenopathy. Does not bruise/bleed easily.  Psychiatric/Behavioral: Negative for confusion.    MEDICAL HISTORY:  Past Medical History:  Diagnosis Date  . Anxiety   . Basal cell carcinoma    nose  . Bleeding disorder ([place])   . Bronchial asthma   . Cancer ([place])    melanoma; right knee  . 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 Gigi Gin ortho)  . Chronic venous insufficiency 2007   s/p vein stripping and laser ablation  . Depression   . Diverticulosis    mild by colonoscopy  . History of DVT (deep vein thrombosis)    DVTs after 1st pregnancy, not on AC 2/2 bleeding ulcer, greenfield filter in place  . History of gastric ulcer 2009  . History of kidney stones   . Horseshoe kidney    sole, R kidney damage  . HTN (hypertension)   . Hypothyroidism   .  Kidney stones    s/p renal hematoma after lithotripsy on right  . Lupus anticoagulant disorder ([facility])   . Multinodular goiter    s/p thyroidectomy  . Nasal septal perforation    chronic, ENT rec  avoid antihistamine, INS  . Osteoarthritis    ?FM by rheum  . Overweight   . Post-surgical hypothyroidism    for multinodular goiter  . 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 Jacqulyn Ducking  . Seasonal allergies    allergy shots, singulair  . 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 Tachycardia     SURGICAL HISTORY: Past Surgical History:  Procedure Laterality Date  . ABDOMINAL HERNIA REPAIR    . ABDOMINAL HYSTERECTOMY    . ANKLE FUSION Left 05/2013  . APPENDECTOMY  1978  . BACK SURGERY  2009  . CESAREAN SECTION     X 2  . CHOLECYSTECTOMY  1998  . COLONOSCOPY  05/2012   hyperplastic polyps x2, mild diverticulosis Arlyce Dice) rpt 10 yrs  . COLONOSCOPY WITH PROPOFOL N/A 07/01/2018   Procedure: COLONOSCOPY WITH PROPOFOL;  Surgeon: Pasty Spillers, MD;  Location: [facility] ENDOSCOPY;  Service: Endoscopy;  Laterality: N/A;  . CYST EXCISION     back  and shoulder  . ESI Right 10/2015, 02/2016   C4/5 then C5/6 ESI ([place])  . ESI Bilateral 06/2015, 08/2015, 03/2016, 09/2017   L4/5 transforaminal ESI ([place])  . ESOPHAGOGASTRODUODENOSCOPY (EGD) WITH PROPOFOL N/A 07/01/2018   Procedure: ESOPHAGOGASTRODUODENOSCOPY (EGD) WITH PROPOFOL;  Surgeon: Pasty Spillers, MD;  Location: [facility] ENDOSCOPY;  Service: Endoscopy;  Laterality: N/A;  . FL HIP INJECTION ([facility] HX) Left 11/2016   Dr Yves Dill  . FUSION OF TALONAVICULAR JOINT Left 09/12/2016   Procedure: FUSION OF TALONAVICULAR JOINT WITH TRINITY BONE GRAFT;  Surgeon: Gwyneth Revels, DPM;  Location: [facility] ORS;  Service: Podiatry;  Laterality: Left;  Marland Kitchen GASTRIC BYPASS  05/21/05   Dr. Jacqulyn Ducking, Roux-en-Y  . Greenfield filter removal  2015   removed 6 wks after surgery  . hammerhead toes Left   . HERNIA REPAIR     X 3  . IVC FILTER INSERTION N/A 09/09/2016   Procedure: IVC Filter Insertion;  Surgeon: Renford Dills, MD;  Location: [facility] INVASIVE CV LAB;  Service: Cardiovascular;  Laterality: N/A;  . IVC FILTER REMOVAL N/A 11/25/2016   Procedure: IVC Filter  Removal;  Surgeon: Renford Dills, MD;  Location: [facility] INVASIVE CV LAB;  Service: Cardiovascular;  Laterality: N/A;  . JOINT REPLACEMENT    . lyphoma excision  08/2015   arm  . NASAL SEPTUM SURGERY  1981   deviated septum, repaired at [facility]  . PARTIAL HYSTERECTOMY  1989   ovaries remain  . SKIN SURGERY     basel cell  . TONSILLECTOMY  1969  . TOTAL HIP ARTHROPLASTY Right 11/06/2015   Procedure: TOTAL HIP ARTHROPLASTY ANTERIOR APPROACH;  Surgeon: Kennedy Bucker, MD;  Location: [facility] ORS;  Service: Orthopedics;  Laterality: Right;  . TOTAL HIP ARTHROPLASTY Left 03/19/2017   Procedure: TOTAL HIP ARTHROPLASTY ANTERIOR APPROACH;  Surgeon: Kennedy Bucker, MD;  Location: [facility] ORS;  Service: Orthopedics;  Laterality: Left;  . TOTAL KNEE ARTHROPLASTY Left 08/2013  . TOTAL THYROIDECTOMY  09/2011   benign path (done for multinodular goiter concern for cancer)    SOCIAL HISTORY: Social History   Socioeconomic History  . Marital status: Married    Spouse name: Chrissie Noa  . Number of children: 2  . Years of education: 66  . Highest education level:  High school graduate  Occupational History  . Occupation: stay at home  Tobacco Use  . Smoking status: Former Smoker    Packs/day: 0.25    Types: Cigarettes    Quit date: 06/16/2004    Years since quitting: 15.4  . Smokeless tobacco: Never Used  . Tobacco comment: Socially-no longer  Vaping Use  . Vaping Use: Never used  Substance and Sexual Activity  . Alcohol use: Yes    Alcohol/week: 1.0 standard drink    Types: 1 Cans of beer per week    Comment: Occasionally-beer  . Drug use: No  . Sexual activity: Yes    Partners: Male    Birth control/protection: Surgical  Other Topics Concern  . Not on file  Social History Narrative  . Not on file   Social Determinants of Health   Financial Resource Strain:   . Difficulty of Paying Living Expenses:   Food Insecurity:   . Worried About Programme researcher, broadcasting/film/video in the Last Year:   . Barista  in the Last Year:   Transportation Needs:   . Freight forwarder (Medical):   Marland Kitchen Lack of Transportation (Non-Medical):   Physical Activity:   . Days of Exercise per Week:   . Minutes of Exercise per Session:   Stress:   . Feeling of Stress :   Social Connections:   . Frequency of Communication with Friends and Family:   . Frequency of Social Gatherings with Friends and Family:   . Attends Religious Services:   . Active Member of Clubs or Organizations:   . Attends Banker Meetings:   Marland Kitchen Marital Status:   Intimate Partner Violence:   . Fear of Current or Ex-Partner:   . Emotionally Abused:   Marland Kitchen Physically Abused:   . Sexually Abused:     FAMILY HISTORY: Family History  Problem Relation Age of Onset  . Diabetes Mother        prediabetes  . Alzheimer's disease Mother   . Hyperlipidemia Mother   . Diabetes Father   . Coronary artery disease Father 5       CABG  . Alcohol abuse Father   . Lung cancer Father   . Brain cancer Maternal Uncle   . Diabetes Paternal Grandmother   . Lung cancer Brother   . Alzheimer's disease Brother   . Diabetes Maternal Grandmother   . Stroke Neg Hx   . Colon cancer Neg Hx   . Stomach cancer Neg Hx   . Kidney disease Neg Hx   . Breast cancer Neg Hx   . Kidney cancer Neg Hx   . Bladder Cancer Neg Hx   . Ovarian cancer Neg Hx     ALLERGIES:  is allergic to sulfa antibiotics, sulfur, cefuroxime axetil, nutritional supplements, other, oxycodone-acetaminophen, and betadine [povidone iodine].  MEDICATIONS:  Current Outpatient Medications  Medication Sig Dispense Refill  . acetaminophen (TYLENOL) 500 MG tablet Take 1,000 mg by mouth 2 (two) times daily.     Marland Kitchen albuterol (PROVENTIL HFA;VENTOLIN HFA) 108 (90 Base) MCG/ACT inhaler Inhale 2 puffs into the lungs every 6 (six) hours as needed for wheezing or shortness of breath. 1 Inhaler 3  . aspirin 81 MG tablet Take 81 mg by mouth daily.    . benazepril (LOTENSIN) 10 MG tablet Take  1 tablet by mouth once daily 90 tablet 1  . bisacodyl (DULCOLAX) 5 MG EC tablet Take 5 mg by mouth daily as needed for moderate  constipation.    Marland Kitchen buPROPion (WELLBUTRIN SR) 150 MG 12 hr tablet Take 1 tablet by mouth twice daily 180 tablet 0  . calcium carbonate (CALCIUM 600) 600 MG TABS tablet Take 1 tablet (600 mg total) 2 (two) times daily with a meal by mouth. 60 tablet   . cetirizine (ZYRTEC) 10 MG tablet Take 10 mg by mouth daily.    . Cholecalciferol (VITAMIN D3) 1000 UNITS tablet Take 2,000 Units by mouth daily.     . citalopram (CELEXA) 40 MG tablet Take 1 tablet by mouth once daily 90 tablet 1  . cyclobenzaprine (FLEXERIL) 10 MG tablet cyclobenzaprine 10 mg tablet    . EPINEPHrine (EPI-PEN) 0.3 mg/0.3 mL DEVI Inject 0.3 mLs (0.3 mg total) into the muscle once. (Patient taking differently: Inject 0.3 mg into the muscle once. ) 1 Device 2  . estradiol (ESTRACE) 2 MG tablet Take 1 tablet by mouth daily.   0  . Fluticasone-Salmeterol (ADVAIR DISKUS) 100-50 MCG/DOSE AEPB Inhale 1 puff into the lungs 2 (two) times daily. 60 each 5  . furosemide (LASIX) 20 MG tablet Take 1 tablet (20 mg total) by mouth 2 (two) times daily. 180 tablet 1  . levothyroxine (SYNTHROID) 150 MCG tablet Take by mouth.    . liothyronine (CYTOMEL) 5 MCG tablet liothyronine 5 mcg tablet    . metoprolol tartrate (LOPRESSOR) 25 MG tablet TAKE 1 TABLET BY MOUTH TWICE DAILY. 60 tablet 3  . montelukast (SINGULAIR) 10 MG tablet Take 10 mg by mouth every morning.     . ondansetron (ZOFRAN) 4 MG tablet Take 1 tablet (4 mg total) by mouth every 8 (eight) hours as needed for nausea or vomiting. 30 tablet 0  . pantoprazole (PROTONIX) 20 MG tablet Take 1 tablet by mouth twice daily 60 tablet 2  . solifenacin (VESICARE) 5 MG tablet Take by mouth.    . SUMAtriptan (IMITREX) 50 MG tablet Take 50 mg by mouth every 2 (two) hours as needed for migraine. May repeat in 2 hours if headache persists or recurs.    . traZODone (DESYREL) 50 MG  tablet TAKE 1/2 TO 1 (ONE-HALF TO ONE) TABLET BY MOUTH AT BEDTIME AS NEEDED FOR SLEEP 90 tablet 0  . vitamin B-12 (CYANOCOBALAMIN) 500 MCG tablet Take 1 tablet (500 mcg total) by mouth daily. 90 tablet 1  . zinc gluconate 50 MG tablet Take 50 mg by mouth daily.    . ACETAMINOPHEN-BUTALBITAL 50-325 MG TABS Take 1 tablet by mouth at bedtime as needed (sleep). Reported on 09/03/2015 3 times a week (Patient not taking: Reported on 12/05/2019)    . levothyroxine (SYNTHROID) 175 MCG tablet TAKE 1 TABLET BY MOUTH EVERY OTHER DAY (ALTERNATE WITH 150MCG EVERY OTHER DAY) (Patient not taking: Reported on 12/05/2019)    . predniSONE (DELTASONE) 10 MG tablet Take 60mg  PO daily x1d, then 50mg  daily x1d, then 40mg  daily x1d, then 30mg  daily x1d, then 20mg  daily x1d, then 10mg  daily x1d, then stop (Patient not taking: Reported on 12/05/2019) 21 tablet 0  . Turmeric 500 MG CAPS Take 1 capsule by mouth 2 (two) times daily. (Patient not taking: Reported on 12/05/2019)     No current facility-administered medications for this visit.     PHYSICAL EXAMINATION: ECOG PERFORMANCE STATUS: 1 - Symptomatic but completely ambulatory Vitals:   12/05/19 1358  BP: 103/79  Pulse: (!) 56  Resp: 18  Temp: (!) 96.5 F (35.8 C)   Filed Weights   12/05/19 1358  Weight: 230 lb 12.8  oz (104.7 kg)    Physical Exam Constitutional:      General: She is not in acute distress. HENT:     Head: Normocephalic and atraumatic.  Eyes:     General: No scleral icterus.    Pupils: Pupils are equal, round, and reactive to light.  Cardiovascular:     Rate and Rhythm: Normal rate and regular rhythm.     Heart sounds: Normal heart sounds.  Pulmonary:     Effort: Pulmonary effort is normal. No respiratory distress.     Breath sounds: No wheezing.  Abdominal:     General: Bowel sounds are normal. There is no distension.     Palpations: Abdomen is soft.  Musculoskeletal:        General: No deformity. Normal range of motion.      Cervical back: Normal range of motion and neck supple.  Skin:    General: Skin is warm and dry.     Findings: No erythema or rash.  Neurological:     General: No focal deficit present.     Mental Status: She is alert. Mental status is at baseline.     Cranial Nerves: No cranial nerve deficit.     Coordination: Coordination normal.      LABORATORY DATA:  I have reviewed the data as listed Lab Results  Component Value Date   WBC 5.2 12/02/2019   HGB 12.9 12/02/2019   HCT 37.7 12/02/2019   MCV 90.8 12/02/2019   PLT 183 12/02/2019   Recent Labs    02/02/19 0919 07/22/19 0922  NA 140 137  K 4.3 4.5  CL 105 102  CO2 23 23  GLUCOSE 84 78  BUN 11 13  CREATININE 0.86 0.89  CALCIUM 8.3* 8.2*  GFRNONAA 75 72  GFRAA 87 83  PROT 5.9*  --   ALBUMIN 3.9  --   AST 18  --   ALT 15  --   ALKPHOS 75  --   BILITOT 0.3  --    Iron/TIBC/Ferritin/ %Sat    Component Value Date/Time   IRON 89 12/02/2019 1321   IRON 54 02/03/2018 1033   TIBC 377 12/02/2019 1321   TIBC 358 02/03/2018 1033   FERRITIN 51 12/02/2019 1321   FERRITIN 16 02/03/2018 1033   IRONPCTSAT 24 12/02/2019 1321   IRONPCTSAT 15 02/03/2018 1033      ASSESSMENT & PLAN:  1. Other iron deficiency anemia   2. History of gastric bypass    #History of iron deficiency anemia, history of gastric bypass She seems to be doing very well from this aspect. Iron levels are reviewed and discussed with patient.  Iron store has been stable I will hold off additional Feraheme at this point.  Vitamin B12 level has been checked has been high.  She has been taking vitamin B12 1000 MCG daily.  I recommend patient to hold vitamin B12 supplementation for 1 to 2 weeks.  She may switch to a lower dose of vitamin B12 at 500 MCG daily for maintenance.  We will repeat vitamin B12 in the next visit.  Orders Placed This Encounter  Procedures  . CBC with Differential/Platelet    Standing Status:   Future    Standing Expiration Date:    12/04/2020  . Comprehensive metabolic panel    Standing Status:   Future    Standing Expiration Date:   12/04/2020  . Ferritin    Standing Status:   Future    Standing Expiration Date:  12/04/2020  . Iron and TIBC    Standing Status:   Future    Standing Expiration Date:   12/04/2020  . Vitamin B12    Standing Status:   Future    Standing Expiration Date:   12/04/2020    All questions were answered. The patient knows to call the clinic with any problems questions or concerns.  Return of visit:  6 months.  We spent sufficient time to discuss many aspect of care, questions were answered to patient's satisfaction.  Rickard Patience, MD, PhD Hematology Oncology [facility] Pager- [phone] 12/06/2019

## 2019-12-12 DIAGNOSIS — G43719 Chronic migraine without aura, intractable, without status migrainosus: Secondary | ICD-10-CM | POA: Diagnosis not present

## 2019-12-12 DIAGNOSIS — G5603 Carpal tunnel syndrome, bilateral upper limbs: Secondary | ICD-10-CM | POA: Diagnosis not present

## 2019-12-20 ENCOUNTER — Other Ambulatory Visit: Payer: Self-pay | Admitting: *Deleted

## 2019-12-20 DIAGNOSIS — I471 Supraventricular tachycardia: Secondary | ICD-10-CM

## 2019-12-20 MED ORDER — METOPROLOL TARTRATE 25 MG PO TABS: ORAL_TABLET | ORAL | 7 refills | Status: DC

## 2019-12-20 NOTE — Telephone Encounter (Signed)
Requested Prescriptions   Signed Prescriptions Disp Refills  . metoprolol tartrate (LOPRESSOR) 25 MG tablet 60 tablet 7    Sig: TAKE 1 TABLET BY MOUTH TWICE DAILY.    Authorizing Provider: Alver Sorrow    Ordering User: Kendrick Fries

## 2020-01-02 ENCOUNTER — Ambulatory Visit
Admission: RE | Admit: 2020-01-02 | Discharge: 2020-01-02 | Disposition: A | Discharge status: Home / Self Care | Payer: Medicare Other | Attending: Physician Assistant | Admitting: Physician Assistant

## 2020-01-02 ENCOUNTER — Other Ambulatory Visit: Payer: Self-pay

## 2020-01-02 ENCOUNTER — Ambulatory Visit
Admission: RE | Admit: 2020-01-02 | Discharge: 2020-01-02 | Disposition: A | Discharge status: Home / Self Care | Payer: Medicare Other | Source: Ambulatory Visit | Attending: Physician Assistant | Admitting: Physician Assistant

## 2020-01-02 ENCOUNTER — Telehealth (INDEPENDENT_AMBULATORY_CARE_PROVIDER_SITE_OTHER): Payer: Medicare Other | Admitting: Physician Assistant

## 2020-01-02 DIAGNOSIS — J4521 Mild intermittent asthma with (acute) exacerbation: Secondary | ICD-10-CM | POA: Insufficient documentation

## 2020-01-02 DIAGNOSIS — R05 Cough: Secondary | ICD-10-CM | POA: Diagnosis not present

## 2020-01-02 MED ORDER — ALBUTEROL SULFATE HFA 108 (90 BASE) MCG/ACT IN AERS: 2.0000 | INHALATION_SPRAY | Freq: Four times a day (QID) | RESPIRATORY_TRACT | 0 refills | Status: DC | PRN

## 2020-01-02 NOTE — Progress Notes (Signed)
MyChart Video Visit    Virtual Visit via Video Note   This visit type was conducted due to national recommendations for restrictions regarding the COVID-19 Pandemic (e.g. social distancing) in an effort to limit this patient's exposure and mitigate transmission in our community. This patient is at least at moderate risk for complications without adequate follow up. This format is felt to be most appropriate for this patient at this time. Physical exam was limited by quality of the video and audio technology used for the visit.   Patient location: Home  Provider location: Office    I discussed the limitations of evaluation and management by telemedicine and the availability of in person appointments. The patient expressed understanding and agreed to proceed.   Patient: Claire Strong   DOB: 19-Sep-1960   59 y.o. Female  MRN: [id number] Visit Date: 01/02/2020  Today's healthcare provider: Trey Sailors, PA-C   Chief Complaint  Patient presents with  . Cough  I,Margaretha Mahan M Alexzandria Massman,acting as a scribe for Trey Sailors, PA-C.,have documented all relevant documentation on the behalf of Trey Sailors, PA-C,as directed by  Trey Sailors, PA-C while in the presence of Trey Sailors, PA-C.  Subjective    Cough This is a recurrent problem. The current episode started 1 to 4 weeks ago. The problem has been unchanged. The cough is productive of sputum. Associated symptoms include wheezing. Pertinent negatives include no fever, headaches, nasal congestion, postnasal drip, rhinorrhea, sore throat or shortness of breath. Treatments tried: tylenol per pt. The treatment provided mild relief. Her past medical history is significant for bronchitis and pneumonia.    Patient with a history of asthma presenting with increasing SOB and cough. She was seen by video call in this clinic over a month ago. She had a CXR on 11/22/2019 and this was normal. She reports she is not feeling better.  She  received two rounds of prednisone. She reports SOB. She is out of her albuterol inhaler. She denies nasal congestion and sinusitis. Denies facial pain. Requesting antibiotic.     Medications: Outpatient Medications Prior to Visit  Medication Sig  . acetaminophen (TYLENOL) 500 MG tablet Take 1,000 mg by mouth 2 (two) times daily.   . ACETAMINOPHEN-BUTALBITAL 50-325 MG TABS Take 1 tablet by mouth at bedtime as needed (sleep). Reported on 09/03/2015 3 times a week (Patient not taking: Reported on 12/05/2019)  . albuterol (PROVENTIL HFA;VENTOLIN HFA) 108 (90 Base) MCG/ACT inhaler Inhale 2 puffs into the lungs every 6 (six) hours as needed for wheezing or shortness of breath.  Marland Kitchen aspirin 81 MG tablet Take 81 mg by mouth daily.  . benazepril (LOTENSIN) 10 MG tablet Take 1 tablet by mouth once daily  . bisacodyl (DULCOLAX) 5 MG EC tablet Take 5 mg by mouth daily as needed for moderate constipation.  Marland Kitchen buPROPion (WELLBUTRIN SR) 150 MG 12 hr tablet Take 1 tablet by mouth twice daily  . calcium carbonate (CALCIUM 600) 600 MG TABS tablet Take 1 tablet (600 mg total) 2 (two) times daily with a meal by mouth.  . cetirizine (ZYRTEC) 10 MG tablet Take 10 mg by mouth daily.  . Cholecalciferol (VITAMIN D3) 1000 UNITS tablet Take 2,000 Units by mouth daily.   . citalopram (CELEXA) 40 MG tablet Take 1 tablet by mouth once daily  . cyclobenzaprine (FLEXERIL) 10 MG tablet cyclobenzaprine 10 mg tablet  . EPINEPHrine (EPI-PEN) 0.3 mg/0.3 mL DEVI Inject 0.3 mLs (0.3 mg total) into the muscle once. (  Patient taking differently: Inject 0.3 mg into the muscle once. )  . estradiol (ESTRACE) 2 MG tablet Take 1 tablet by mouth daily.   . Fluticasone-Salmeterol (ADVAIR DISKUS) 100-50 MCG/DOSE AEPB Inhale 1 puff into the lungs 2 (two) times daily.  . furosemide (LASIX) 20 MG tablet Take 1 tablet (20 mg total) by mouth 2 (two) times daily.  Marland Kitchen levothyroxine (SYNTHROID) 150 MCG tablet Take by mouth.  . levothyroxine (SYNTHROID)  175 MCG tablet TAKE 1 TABLET BY MOUTH EVERY OTHER DAY (ALTERNATE WITH 150MCG EVERY OTHER DAY) (Patient not taking: Reported on 12/05/2019)  . liothyronine (CYTOMEL) 5 MCG tablet liothyronine 5 mcg tablet  . metoprolol tartrate (LOPRESSOR) 25 MG tablet TAKE 1 TABLET BY MOUTH TWICE DAILY.  . montelukast (SINGULAIR) 10 MG tablet Take 10 mg by mouth every morning.   . ondansetron (ZOFRAN) 4 MG tablet Take 1 tablet (4 mg total) by mouth every 8 (eight) hours as needed for nausea or vomiting.  . pantoprazole (PROTONIX) 20 MG tablet Take 1 tablet by mouth twice daily  . predniSONE (DELTASONE) 10 MG tablet Take 60mg  PO daily x1d, then 50mg  daily x1d, then 40mg  daily x1d, then 30mg  daily x1d, then 20mg  daily x1d, then 10mg  daily x1d, then stop (Patient not taking: Reported on 12/05/2019)  . solifenacin (VESICARE) 5 MG tablet Take by mouth.  . SUMAtriptan (IMITREX) 50 MG tablet Take 50 mg by mouth every 2 (two) hours as needed for migraine. May repeat in 2 hours if headache persists or recurs.  . traZODone (DESYREL) 50 MG tablet TAKE 1/2 TO 1 (ONE-HALF TO ONE) TABLET BY MOUTH AT BEDTIME AS NEEDED FOR SLEEP  . Turmeric 500 MG CAPS Take 1 capsule by mouth 2 (two) times daily. (Patient not taking: Reported on 12/05/2019)  . vitamin B-12 (CYANOCOBALAMIN) 500 MCG tablet Take 1 tablet (500 mcg total) by mouth daily.  Marland Kitchen zinc gluconate 50 MG tablet Take 50 mg by mouth daily.   No facility-administered medications prior to visit.    Review of Systems  Constitutional: Negative for fever.  HENT: Negative for postnasal drip, rhinorrhea and sore throat.   Respiratory: Positive for cough and wheezing. Negative for shortness of breath.   Neurological: Negative for headaches.      Objective    There were no vitals taken for this visit.   Physical Exam Constitutional:      Appearance: Normal appearance.  Pulmonary:     Effort: Pulmonary effort is normal. No respiratory distress.  Neurological:     Mental  Status: She is alert.  Psychiatric:        Mood and Affect: Mood normal.        Behavior: Behavior normal.        Assessment & Plan    1. Mild intermittent asthma with acute exacerbation  Follow up CXR is negative for pneumonia. Recommend scheduled use of albuterol as two rounds of prednisone have not been effective. Abx not indicated. Follow up if worsening.  - albuterol (VENTOLIN HFA) 108 (90 Base) MCG/ACT inhaler; Inhale 2 puffs into the lungs every 6 (six) hours as needed for wheezing or shortness of breath.  Dispense: 6.7 g; Refill: 0 - DG Chest 2 View; Future    No follow-ups on file.     I discussed the assessment and treatment plan with the patient. The patient was provided an opportunity to ask questions and all were answered. The patient agreed with the plan and demonstrated an understanding of the instructions.  The patient was advised to call back or seek an in-person evaluation if the symptoms worsen or if the condition fails to improve as anticipated.   ITrey Sailors, PA-C, have reviewed all documentation for this visit. The documentation on 01/04/20 for the exam, diagnosis, procedures, and orders are all accurate and complete.   Maryella Shivers [facility] [phone] (phone) [phone] (fax)  [facility]

## 2020-01-05 ENCOUNTER — Other Ambulatory Visit: Payer: Self-pay | Admitting: Family Medicine

## 2020-01-05 MED ORDER — BENAZEPRIL HCL 10 MG PO TABS: 10.0000 mg | ORAL_TABLET | Freq: Every day | ORAL | 0 refills | Status: DC

## 2020-01-05 NOTE — Telephone Encounter (Signed)
Please review. Thanks!  

## 2020-01-05 NOTE — Telephone Encounter (Signed)
[facility] faxed refill request for the following medications:  benazepril (LOTENSIN) 10 MG tablet  90 day supply  Last Rx: 10/02/2019 LOV: 01/02/2020 This is Dr.B's pt but pt last saw Adriana. Can Adriana review request due to Dr. Senaida Lange absence? Please advise. Thanks TNP

## 2020-01-10 ENCOUNTER — Telehealth: Payer: Self-pay | Admitting: Family Medicine

## 2020-01-10 NOTE — Telephone Encounter (Signed)
Copied from CRM [id number]. Topic: Medicare AWV >> Jan 10, 2020 11:37 AM Claudette Laws R wrote: Reason for CRM:   Need to rescheduled AWVI from Jan 20, 2020 @ 9:00 am to another day either in office or by phone due to [facility] not working on Friday's.

## 2020-01-16 NOTE — Progress Notes (Signed)
Annual Wellness Visit     Patient: Claire Strong, Female    DOB: 05/07/1961, 59 y.o.   MRN: [id number] Visit Date: 01/20/2020  Today's Provider: Trey Sailors, PA-C   Chief Complaint  Patient presents with  . Annual Exam  I,Porsha C McClurkin,acting as a scribe for Trey Sailors, PA-C.,have documented all relevant documentation on the behalf of Trey Sailors, PA-C,as directed by  Trey Sailors, PA-C while in the presence of Trey Sailors, PA-C.  Subjective    Claire Strong is a 59 y.o. female who presents today for her Annual Wellness Visit. She reports consuming a high protein diet. The patient does not participate in regular exercise at present. She generally feels well. She reports sleeping well. She does  have additional problems to discuss today.   HPI   Hypertension, follow-up  BP Readings from Last 3 Encounters:  01/23/20 105/70  01/20/20 118/68  12/05/19 103/79   Wt Readings from Last 3 Encounters:  01/23/20 234 lb 12.8 oz (106.5 kg)  01/20/20 231 lb 12.8 oz (105.1 kg)  12/05/19 230 lb 12.8 oz (104.7 kg)     She was last seen for hypertension 6 months ago.  BP at that visit was normtensive. Management since that visit includes continue lotensin 10 mg QD and metoprolol tartrate 25 mg BID. Lasix was increased to 20 mg BID.  She reports excellent compliance with treatment. She is not having side effects.  She is following a Regular diet. She is exercising. She does not smoke.  Use of agents associated with hypertension: none.   Outside blood pressures are normal. Symptoms: No chest pain No chest pressure  No palpitations No syncope  No dyspnea No orthopnea  No paroxysmal nocturnal dyspnea No lower extremity edema   Pertinent labs: Lab Results  Component Value Date   CHOL 168 01/23/2020   HDL 52 01/23/2020   LDLCALC 88 01/23/2020   TRIG 166 (H) 01/23/2020   CHOLHDL 3.2 01/23/2020   Lab Results  Component Value Date   NA 141  01/23/2020   K 4.6 01/23/2020   CREATININE 1.00 01/23/2020   GFRNONAA 62 01/23/2020   GFRAA 72 01/23/2020   GLUCOSE 73 01/23/2020     The 10-year ASCVD risk score Denman George DC Jr., et al., 2013) is: 2.2%   --------------------------------------------------------------------------------------------------- Hypothyroid, follow-up  Lab Results  Component Value Date   TSH 0.321 (L) 11/10/2019   TSH 0.663 04/29/2019   TSH 0.615 11/04/2018   FREET4 0.88 11/10/2019   FREET4 0.85 04/29/2019   Wt Readings from Last 3 Encounters:  01/23/20 234 lb 12.8 oz (106.5 kg)  01/20/20 231 lb 12.8 oz (105.1 kg)  12/05/19 230 lb 12.8 oz (104.7 kg)    She was last seen for hypothyroid 6 months ago.  Management since that visit includes continue synthroid 150 mcg daily. She reports excellent compliance with treatment. She is not having side effects.   Symptoms: No change in energy level No constipation  No diarrhea No heat / cold intolerance  No nervousness No palpitations  No weight changes    ----------------------------------------------------------------------------------------- Depression, Follow-up  She  was last seen for this 6 months ago. Changes made at last visit include continue celexa and wellbutrin.   She reports excellent compliance with treatment. She is not having side effects.   She reports excellent tolerance of treatment. Current symptoms include: depressed mood She feels she is Unchanged since last visit.  Depression screen [facility] 2/9 01/23/2020  01/20/2020 07/22/2019  Decreased Interest 2 1 1   Down, Depressed, Hopeless 0 1 1  PHQ - 2 Score 2 2 2   Altered sleeping 1 1 0  Tired, decreased energy 0 0 2  Change in appetite 1 3 2   Feeling bad or failure about yourself  1 1 2   Trouble concentrating 0 2 2  Moving slowly or fidgety/restless 0 1 0  Suicidal thoughts 0 0 0  PHQ-9 Score 5 10 10   Difficult doing work/chores Somewhat difficult Not difficult at all Not difficult at all   Some recent data might be hidden    ----------------------------------------------------------------------------------------- -----------------------------------------------------------------------------------------        Medications: Outpatient Medications Prior to Visit  Medication Sig  . acetaminophen (TYLENOL) 500 MG tablet Take 1,000 mg by mouth 2 (two) times daily.   . ACETAMINOPHEN-BUTALBITAL 50-325 MG TABS Take 1 tablet by mouth at bedtime as needed (sleep). Reported on 09/03/2015 3 times a week (Patient not taking: Reported on 12/05/2019)  . albuterol (VENTOLIN HFA) 108 (90 Base) MCG/ACT inhaler Inhale 2 puffs into the lungs every 6 (six) hours as needed for wheezing or shortness of breath.  Marland Kitchen aspirin 81 MG tablet Take 81 mg by mouth daily.  . benazepril (LOTENSIN) 10 MG tablet Take 1 tablet (10 mg total) by mouth daily.  . bisacodyl (DULCOLAX) 5 MG EC tablet Take 5 mg by mouth daily as needed for moderate constipation.  Marland Kitchen buPROPion (WELLBUTRIN SR) 150 MG 12 hr tablet Take 1 tablet by mouth twice daily  . calcium carbonate (CALCIUM 600) 600 MG TABS tablet Take 1 tablet (600 mg total) 2 (two) times daily with a meal by mouth.  . cetirizine (ZYRTEC) 10 MG tablet Take 10 mg by mouth daily.  . Cholecalciferol (VITAMIN D3) 1000 UNITS tablet Take 2,000 Units by mouth daily.   . citalopram (CELEXA) 40 MG tablet Take 1 tablet by mouth once daily  . cyclobenzaprine (FLEXERIL) 10 MG tablet cyclobenzaprine 10 mg tablet  . EPINEPHrine (EPI-PEN) 0.3 mg/0.3 mL DEVI Inject 0.3 mLs (0.3 mg total) into the muscle once. (Patient taking differently: Inject 0.3 mg into the muscle once. )  . estradiol (ESTRACE) 2 MG tablet Take 1 tablet by mouth daily.   . Fluticasone-Salmeterol (ADVAIR DISKUS) 100-50 MCG/DOSE AEPB Inhale 1 puff into the lungs 2 (two) times daily.  . furosemide (LASIX) 20 MG tablet Take 1 tablet (20 mg total) by mouth 2 (two) times daily.  Marland Kitchen levothyroxine (SYNTHROID) 150 MCG tablet  Take by mouth.  . levothyroxine (SYNTHROID) 175 MCG tablet TAKE 1 TABLET BY MOUTH EVERY OTHER DAY (ALTERNATE WITH 150MCG EVERY OTHER DAY) (Patient not taking: Reported on 12/05/2019)  . liothyronine (CYTOMEL) 5 MCG tablet liothyronine 5 mcg tablet  . metoprolol tartrate (LOPRESSOR) 25 MG tablet TAKE 1 TABLET BY MOUTH TWICE DAILY.  . montelukast (SINGULAIR) 10 MG tablet Take 10 mg by mouth every morning.   . ondansetron (ZOFRAN) 4 MG tablet Take 1 tablet (4 mg total) by mouth every 8 (eight) hours as needed for nausea or vomiting.  . pantoprazole (PROTONIX) 20 MG tablet Take 1 tablet by mouth twice daily  . predniSONE (DELTASONE) 10 MG tablet Take 60mg  PO daily x1d, then 50mg  daily x1d, then 40mg  daily x1d, then 30mg  daily x1d, then 20mg  daily x1d, then 10mg  daily x1d, then stop (Patient not taking: Reported on 12/05/2019)  . solifenacin (VESICARE) 5 MG tablet Take by mouth.  . SUMAtriptan (IMITREX) 50 MG tablet Take 50 mg by mouth every  2 (two) hours as needed for migraine. May repeat in 2 hours if headache persists or recurs.  . traZODone (DESYREL) 50 MG tablet TAKE 1/2 TO 1 (ONE-HALF TO ONE) TABLET BY MOUTH AT BEDTIME AS NEEDED FOR SLEEP  . Turmeric 500 MG CAPS Take 1 capsule by mouth 2 (two) times daily. (Patient not taking: Reported on 12/05/2019)  . vitamin B-12 (CYANOCOBALAMIN) 500 MCG tablet Take 1 tablet (500 mcg total) by mouth daily.  Marland Kitchen zinc gluconate 50 MG tablet Take 50 mg by mouth daily.   No facility-administered medications prior to visit.    Allergies  Allergen Reactions  . Sulfa Antibiotics Shortness Of Breath  . Sulfur Anaphylaxis  . Cefuroxime Axetil Other (See Comments)    Bleeding ulcer  . Nutritional Supplements Swelling    Unsure which  . Other     All narcotics   . Oxycodone-Acetaminophen Nausea And Vomiting  . Betadine [Povidone Iodine] Rash    Patient Care Team: [name], MD as PCP - General (Family Medicine) Antonieta Iba, MD as PCP -  Cardiology (Cardiology) Gwyneth Revels, DPM as Referring Physician (Podiatry) Gwyneth Revels, DPM as Referring Physician (Podiatry) Schnier, Latina Craver, MD (Vascular Surgery)  Review of Systems  Constitutional: Negative.   HENT: Negative.   Eyes: Negative.   Respiratory: Negative.   Cardiovascular: Negative.   Gastrointestinal: Negative.   Endocrine: Negative.   Genitourinary: Negative.   Musculoskeletal: Negative.   Skin: Negative.   Allergic/Immunologic: Negative.   Neurological: Negative.   Hematological: Negative.   Psychiatric/Behavioral: Negative.       Objective    Vitals: BP 118/68 (BP Location: Left Arm, Patient Position: Sitting, Cuff Size: Large)   Pulse 60   Temp 98.9 F (37.2 C) (Oral)   Ht 5\' 9"  (1.753 m)   Wt 231 lb 12.8 oz (105.1 kg)   SpO2 99%   BMI 34.23 kg/m    Physical Exam Constitutional:      Appearance: Normal appearance.  HENT:     Right Ear: Tympanic membrane, ear canal and external ear normal.     Left Ear: Tympanic membrane, ear canal and external ear normal.  Cardiovascular:     Rate and Rhythm: Normal rate and regular rhythm.     Pulses: Normal pulses.     Heart sounds: Normal heart sounds.  Pulmonary:     Effort: Pulmonary effort is normal.     Breath sounds: Normal breath sounds.  Abdominal:     General: Abdomen is flat. Bowel sounds are normal.     Palpations: Abdomen is soft.  Skin:    General: Skin is warm and dry.  Neurological:     General: No focal deficit present.     Mental Status: She is alert and oriented to person, place, and time.  Psychiatric:        Mood and Affect: Mood normal.        Behavior: Behavior normal.      Most recent functional status assessment: In your present state of health, do you have any difficulty performing the following activities: 01/20/2020  Hearing? N  Vision? N  Difficulty concentrating or making decisions? N  Walking or climbing stairs? N  Dressing or bathing? N  Doing errands,  shopping? N  Some recent data might be hidden   Most recent fall risk assessment: Fall Risk  01/20/2020  Falls in the past year? 1  Number falls in past yr: 1  Comment -  Injury with Fall? 0  Follow up -    Most recent depression screenings: PHQ 2/9 Scores 01/20/2020 07/22/2019  PHQ - 2 Score 2 2  PHQ- 9 Score 10 10   Most recent cognitive screening: No flowsheet data found. Most recent Audit-C alcohol use screening Alcohol Use Disorder Test (AUDIT) 01/20/2020  1. How often do you have a drink containing alcohol? 1  2. How many drinks containing alcohol do you have on a typical day when you are drinking? 0  3. How often do you have six or more drinks on one occasion? 0  AUDIT-C Score 1   A score of 3 or more in women, and 4 or more in men indicates increased risk for alcohol abuse, EXCEPT if all of the points are from question 1   No results found for any visits on 01/20/20.  Assessment & Plan    1. Annual physical exam  - Lipid panel - Comprehensive metabolic panel  2. Encounter for screening mammogram for malignant neoplasm of breast  - MM 3D SCREEN BREAST BILATERAL  3. Essential hypertension  Well controlled Continue current medications Recheck metabolic panel F/u in 6 months  - Comprehensive metabolic panel  4. Hypothyroidism, postsurgical  Check labs and adjust pending results.  - Lipid panel  5. Moderate episode of recurrent major depressive disorder (HCC)   Continue current medications.  6. GAD (generalized anxiety disorder)  Continue current medications.     Annual wellness visit done today including the all of the following: Reviewed patient's Family Medical History Reviewed and updated list of patient's medical providers Assessment of cognitive impairment was done Assessed patient's functional ability Established a written schedule for health screening services Health Risk Assessent Completed and Reviewed  Exercise Activities and Dietary  recommendations Goals   None     Immunization History  Administered Date(s) Administered  . Influenza Split 03/18/2011, 03/31/2012  . Influenza,inj,Quad PF,6+ Mos 03/18/2013, 04/04/2014, 04/30/2017, 02/03/2018, 03/10/2019  . Tdap 12/19/2010    Health Maintenance  Topic Date Due  . COVID-19 Vaccine (1) Never done  . HIV Screening  Never done  . MAMMOGRAM  12/23/2019  . INFLUENZA VACCINE  01/15/2020  . TETANUS/TDAP  12/18/2020  . COLONOSCOPY  07/01/2028  . Hepatitis C Screening  Completed     Discussed health benefits of physical activity, and encouraged her to engage in regular exercise appropriate for her age and condition.    ITrey Sailors, PA-C, have reviewed all documentation for this visit. The documentation on 02/14/20 for the exam, diagnosis, procedures, and orders are all accurate and complete.  The entirety of the information documented in the History of Present Illness, Review of Systems and Physical Exam were personally obtained by me. Portions of this information were initially documented by Angelica Ran, CMA and reviewed by me for thoroughness and accuracy.     Maryella Shivers  [facility] [phone] (phone) [phone] (fax)  [name]

## 2020-01-19 NOTE — Progress Notes (Signed)
.   Subjective:   Claire Strong is a 59 y.o. female who presents for Medicare Annual (Subsequent) preventive examination.  Review of Systems    N/A  Cardiac Risk Factors include: advanced age (>53men, >39 women);obesity (BMI >30kg/m2);hypertension     Objective:    Today's Vitals   01/23/20 1412  BP: 105/70  Pulse: (!) 59  Temp: 98.7 F (37.1 C)  TempSrc: Oral  SpO2: 97%  Weight: 234 lb 12.8 oz (106.5 kg)  Height: 5\' 8"  (1.727 m)  PainSc: 0-No pain   Body mass index is 35.7 kg/m.  Advanced Directives 01/23/2020 12/05/2019 06/07/2019 12/08/2018 07/01/2018 06/01/2018 03/19/2017  Does Patient Have a Medical Advance Directive? Yes Yes Yes Yes Yes Yes Yes  Type of 05/19/2017 of [place];Living will Living will;Healthcare Power of Attorney Living will Living will - Living will Living will  Does patient want to make changes to medical advance directive? - - No - Patient declined - - - No - Patient declined  Copy of Healthcare Power of Attorney in Chart? No - copy requested - No - copy requested - - - No - copy requested    Current Medications (verified) Outpatient Encounter Medications as of 01/23/2020  Medication Sig  . acetaminophen (TYLENOL) 500 MG tablet Take 1,000 mg by mouth 2 (two) times daily.   03/24/2020 albuterol (VENTOLIN HFA) 108 (90 Base) MCG/ACT inhaler Inhale 2 puffs into the lungs every 6 (six) hours as needed for wheezing or shortness of breath.  . Ascorbic Acid (VITAMIN C) 100 MG tablet Take by mouth daily. Unsure dose  . aspirin 81 MG tablet Take 81 mg by mouth daily.  . benazepril (LOTENSIN) 10 MG tablet Take 1 tablet (10 mg total) by mouth daily.  . bisacodyl (DULCOLAX) 5 MG EC tablet Take 5 mg by mouth daily as needed for moderate constipation.  Marland Kitchen buPROPion (WELLBUTRIN SR) 150 MG 12 hr tablet Take 1 tablet by mouth twice daily  . calcium carbonate (CALCIUM 600) 600 MG TABS tablet Take 1 tablet (600 mg total) 2 (two) times daily with a meal by  mouth.  . cetirizine (ZYRTEC) 10 MG tablet Take 10 mg by mouth daily.  . Cholecalciferol (VITAMIN D3) 1000 UNITS tablet Take 2,000 Units by mouth daily.   . citalopram (CELEXA) 40 MG tablet Take 1 tablet by mouth once daily  . cyclobenzaprine (FLEXERIL) 10 MG tablet Take 10 mg by mouth 3 (three) times daily as needed.   Marland Kitchen EPINEPHrine (EPI-PEN) 0.3 mg/0.3 mL DEVI Inject 0.3 mLs (0.3 mg total) into the muscle once. (Patient taking differently: Inject 0.3 mg into the muscle once. )  . estradiol (ESTRACE) 2 MG tablet Take 1 tablet by mouth daily.   . Fluticasone-Salmeterol (ADVAIR DISKUS) 100-50 MCG/DOSE AEPB Inhale 1 puff into the lungs 2 (two) times daily.  . furosemide (LASIX) 20 MG tablet Take 1 tablet (20 mg total) by mouth 2 (two) times daily.  Marland Kitchen levothyroxine (SYNTHROID) 150 MCG tablet Take 150 mcg by mouth daily before breakfast.   . liothyronine (CYTOMEL) 5 MCG tablet Take 5 mcg by mouth daily.   . metoprolol tartrate (LOPRESSOR) 25 MG tablet TAKE 1 TABLET BY MOUTH TWICE DAILY.  . montelukast (SINGULAIR) 10 MG tablet Take 10 mg by mouth every morning.   . ondansetron (ZOFRAN) 4 MG tablet Take 1 tablet (4 mg total) by mouth every 8 (eight) hours as needed for nausea or vomiting.  . pantoprazole (PROTONIX) 20 MG tablet Take 1 tablet  by mouth twice daily  . solifenacin (VESICARE) 5 MG tablet Take 5 mg by mouth daily.   . SUMAtriptan (IMITREX) 50 MG tablet Take 50 mg by mouth every 2 (two) hours as needed for migraine. May repeat in 2 hours if headache persists or recurs.  . traZODone (DESYREL) 50 MG tablet TAKE 1/2 TO 1 (ONE-HALF TO ONE) TABLET BY MOUTH AT BEDTIME AS NEEDED FOR SLEEP  . vitamin B-12 (CYANOCOBALAMIN) 500 MCG tablet Take 1 tablet (500 mcg total) by mouth daily.  Marland Kitchen zinc gluconate 50 MG tablet Take 50 mg by mouth daily.  . ACETAMINOPHEN-BUTALBITAL 50-325 MG TABS Take 1 tablet by mouth at bedtime as needed (sleep). Reported on 09/03/2015 3 times a week (Patient not taking: Reported  on 01/23/2020)  . levothyroxine (SYNTHROID) 175 MCG tablet TAKE 1 TABLET BY MOUTH EVERY OTHER DAY (ALTERNATE WITH 150MCG EVERY OTHER DAY) (Patient not taking: Reported on 12/05/2019)  . predniSONE (DELTASONE) 10 MG tablet Take 60mg  PO daily x1d, then 50mg  daily x1d, then 40mg  daily x1d, then 30mg  daily x1d, then 20mg  daily x1d, then 10mg  daily x1d, then stop (Patient not taking: Reported on 12/05/2019)  . Turmeric 500 MG CAPS Take 1 capsule by mouth 2 (two) times daily. (Patient not taking: Reported on 12/05/2019)   No facility-administered encounter medications on file as of 01/23/2020.    Allergies (verified) Sulfa antibiotics, Sulfur, Cefuroxime axetil, Nutritional supplements, Other, Oxycodone-acetaminophen, and Betadine [povidone iodine]   History: Past Medical History:  Diagnosis Date  . Anxiety   . Basal cell carcinoma    nose  . Bleeding disorder ([place])   . Bronchial asthma   . Cancer ([place])    melanoma; right knee  . 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 Gigi Gin ortho)  . Chronic venous insufficiency 2007   s/p vein stripping and laser ablation  . Depression   . Diverticulosis    mild by colonoscopy  . History of DVT (deep vein thrombosis)    DVTs after 1st pregnancy, not on AC 2/2 bleeding ulcer, greenfield filter in place  . History of gastric ulcer 2009  . History of kidney stones   . Horseshoe kidney    sole, R kidney damage  . HTN (hypertension)   . Hypothyroidism   . Kidney stones    s/p renal hematoma after lithotripsy on right  . Lupus anticoagulant disorder ([place])   . Multinodular goiter    s/p thyroidectomy  . Nasal septal perforation    chronic, ENT rec avoid antihistamine, INS  . Osteoarthritis    ?FM by rheum  . Overweight   . Post-surgical hypothyroidism    for multinodular goiter  . 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 Frutoso Chase  . Seasonal allergies    allergy shots, singulair  . 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  Tachycardia    Past Surgical History:  Procedure Laterality Date  . ABDOMINAL HERNIA REPAIR    . ABDOMINAL HYSTERECTOMY    . ANKLE FUSION Left 05/2013  . APPENDECTOMY  1978  . BACK SURGERY  2009  . CESAREAN SECTION     X 2  . CHOLECYSTECTOMY  1998  . COLONOSCOPY  05/2012   hyperplastic polyps x2, mild diverticulosis Deatra Ina) rpt 10 yrs  . COLONOSCOPY WITH PROPOFOL N/A 07/01/2018   Procedure: COLONOSCOPY WITH PROPOFOL;  Surgeon: Virgel Manifold, MD;  Location: [facility] ENDOSCOPY;  Service: Endoscopy;  Laterality: N/A;  . CYST EXCISION  back  and shoulder  . ESI Right 10/2015, 02/2016   C4/5 then C5/6 ESI ([place])  . ESI Bilateral 06/2015, 08/2015, 03/2016, 09/2017   L4/5 transforaminal ESI ([place])  . ESOPHAGOGASTRODUODENOSCOPY (EGD) WITH PROPOFOL N/A 07/01/2018   Procedure: ESOPHAGOGASTRODUODENOSCOPY (EGD) WITH PROPOFOL;  Surgeon: Pasty Spillers, MD;  Location: [facility] ENDOSCOPY;  Service: Endoscopy;  Laterality: N/A;  . FL HIP INJECTION ([facility] HX) Left 11/2016   Dr Yves Dill  . FUSION OF TALONAVICULAR JOINT Left 09/12/2016   Procedure: FUSION OF TALONAVICULAR JOINT WITH TRINITY BONE GRAFT;  Surgeon: Gwyneth Revels, DPM;  Location: [facility] ORS;  Service: Podiatry;  Laterality: Left;  Marland Kitchen GASTRIC BYPASS  05/21/05   Dr. Jacqulyn Ducking, Roux-en-Y  . Greenfield filter removal  2015   removed 6 wks after surgery  . hammerhead toes Left   . HERNIA REPAIR     X 3  . IVC FILTER INSERTION N/A 09/09/2016   Procedure: IVC Filter Insertion;  Surgeon: Renford Dills, MD;  Location: [facility] INVASIVE CV LAB;  Service: Cardiovascular;  Laterality: N/A;  . IVC FILTER REMOVAL N/A 11/25/2016   Procedure: IVC Filter Removal;  Surgeon: Renford Dills, MD;  Location: [facility]VASIVE CV LAB;  Service: Cardiovascular;  Laterality: N/A;  . JOINT REPLACEMENT    . lyphoma excision  08/2015   arm  . NASAL SEPTUM SURGERY  1981   deviated septum, [facility] at [facility]  . PARTIAL HYSTERECTOMY  1989   ovaries remain  .  SKIN SURGERY     basel cell  . TONSILLECTOMY  1969  . TOTAL HIP ARTHROPLASTY Right 11/06/2015   Procedure: TOTAL HIP ARTHROPLASTY ANTERIOR APPROACH;  Surgeon: Kennedy Bucker, MD;  Location: [facility] ORS;  Service: Orthopedics;  Laterality: Right;  . TOTAL HIP ARTHROPLASTY Left 03/19/2017   Procedure: TOTAL HIP ARTHROPLASTY ANTERIOR APPROACH;  Surgeon: Kennedy Bucker, MD;  Location: [facility] ORS;  Service: Orthopedics;  Laterality: Left;  . TOTAL KNEE ARTHROPLASTY Left 08/2013  . TOTAL THYROIDECTOMY  09/2011   benign path (done for multinodular goiter concern for cancer)   Family History  Problem Relation Age of Onset  . Diabetes Mother        prediabetes  . Alzheimer's disease Mother   . Hyperlipidemia Mother   . Diabetes Father   . Coronary artery disease Father 2       CABG  . Alcohol abuse Father   . Lung cancer Father   . Brain cancer Maternal Uncle   . Diabetes Paternal Grandmother   . Lung cancer Brother   . Alzheimer's disease Brother   . Diabetes Maternal Grandmother   . Stroke Neg Hx   . Colon cancer Neg Hx   . Stomach cancer Neg Hx   . Kidney disease Neg Hx   . Breast cancer Neg Hx   . Kidney cancer Neg Hx   . Bladder Cancer Neg Hx   . Ovarian cancer Neg Hx    Social History   Socioeconomic History  . Marital status: Married    Spouse name: Chrissie Noa  . Number of children: 2  . Years of education: 24  . Highest education level: High school graduate  Occupational History  . Occupation: stay at home  Tobacco Use  . Smoking status: Former Smoker    Packs/day: 0.25    Types: Cigarettes    Quit date: 06/16/2004    Years since quitting: 15.6  . Smokeless tobacco: Never Used  . Tobacco comment: Socially-no longer  Vaping Use  . Vaping Use:  Never used  Substance and Sexual Activity  . Alcohol use: Yes    Alcohol/week: 0.0 standard drinks    Comment: Occasionally-beer or bloody mary monthly  . Drug use: No  . Sexual activity: Yes    Partners: Male    Birth  control/protection: Surgical  Other Topics Concern  . Not on file  Social History Narrative  . Not on file   Social Determinants of Health   Financial Resource Strain: Low Risk   . Difficulty of Paying Living Expenses: Not hard at all  Food Insecurity: No Food Insecurity  . Worried About Programme researcher, broadcasting/film/video in the Last Year: Never true  . Ran Out of Food in the Last Year: Never true  Transportation Needs: No Transportation Needs  . Lack of Transportation (Medical): No  . Lack of Transportation (Non-Medical): No  Physical Activity: Inactive  . Days of Exercise per Week: 0 days  . Minutes of Exercise per Session: 0 min  Stress: Stress Concern Present  . Feeling of Stress : To some extent  Social Connections: Moderately Isolated  . Frequency of Communication with Friends and Family: More than three times a week  . Frequency of Social Gatherings with Friends and Family: More than three times a week  . Attends Religious Services: Never  . Active Member of Clubs or Organizations: No  . Attends Banker Meetings: Never  . Marital Status: Married    Tobacco Counseling Counseling given: Not Answered Comment: Socially-no longer   Clinical Intake:  Pre-visit preparation completed: Yes  Pain : No/denies pain Pain Score: 0-No pain     Nutritional Status: BMI > 30  Obese Nutritional Risks: None Diabetes: No  How often do you need to have someone help you when you read instructions, pamphlets, or other written materials from your doctor or pharmacy?: 1 - Never  Diabetic? No  Interpreter Needed?: No  Information entered by :: [facility], LPN   Activities of Daily Living In your present state of health, do you have any difficulty performing the following activities: 01/23/2020 01/20/2020  Hearing? N N  Vision? N N  Comment Wears eye glasses. -  Difficulty concentrating or making decisions? N N  Walking or climbing stairs? Y N  Comment Due to knee pains. -    Dressing or bathing? N N  Doing errands, shopping? N N  Preparing Food and eating ? N -  Using the Toilet? N -  In the past six months, have you accidently leaked urine? N -  Do you have problems with loss of bowel control? N -  Managing your Medications? N -  Managing your Finances? N -  Housekeeping or managing your Housekeeping? N -  Some recent data might be hidden    Patient Care Team: Bacigalupo, Marzella Schlein, MD as PCP - General (Family Medicine) Mariah Milling Tollie Pizza, MD as PCP - Cardiology (Cardiology) Gwyneth Revels, DPM as Referring Physician (Podiatry) Schnier, Latina Craver, MD (Vascular Surgery) Kennedy Bucker, MD as Consulting Physician (Orthopedic Surgery)  Indicate any recent Medical Services you may have received from other than [facility] providers in the past year (date may be approximate).     Assessment:   This is a routine wellness examination for [place].  Hearing/Vision screen No exam data present  Dietary issues and exercise activities discussed: Current Exercise Habits: The patient does not participate in regular exercise at present, Exercise limited by: orthopedic condition(s)  Goals    . LIFESTYLE - DECREASE FALLS RISK  RRecommend to remove any items from the home that may cause slips or trips.      Depression Screen PHQ 2/9 Scores 01/23/2020 01/20/2020 07/22/2019 01/19/2019 05/06/2018 02/03/2018 11/03/2017  PHQ - 2 Score 2 2 2 4 4 2  0  PHQ- 9 Score 5 10 10 12 18 13 7     Fall Risk Fall Risk  01/23/2020 01/20/2020 01/19/2019 11/03/2017  Falls in the past year? 1 1 1  Yes  Number falls in past yr: 1 1 1 2  or more  Comment - - 5 per pt -  Injury with Fall? 0 0 0 No  Risk for fall due to : Impaired mobility - - -  Follow up - - - Falls evaluation completed    Any stairs in or around the home? Yes  If so, are there any without handrails? No  Home free of loose throw rugs in walkways, pet beds, electrical cords, etc? Yes  Adequate lighting in your home to reduce risk of  falls? Yes   ASSISTIVE DEVICES UTILIZED TO PREVENT FALLS:  Life alert? No  Use of a cane, walker or w/c? No  Grab bars in the bathroom? Yes  Shower chair or bench in shower? Yes Elevated toilet seat or a handicapped toilet? Yes   TIMED UP AND GO:  Was the test performed? Yes .  Length of time to ambulate 10 feet: 10 sec.   Gait slow and steady without use of assistive device  Cognitive Function: Declined today.         Immunizations Immunization History  Administered Date(s) Administered  . Influenza Split 03/18/2011, 03/31/2012  . Influenza,inj,Quad PF,6+ Mos 03/18/2013, 04/04/2014, 04/30/2017, 02/03/2018, 03/10/2019  . Moderna SARS-COVID-2 Vaccination 09/04/2019, 09/27/2019  . Tdap 12/19/2010    TDAP status: Up to date Flu Vaccine status: Up to date Covid-19 vaccine status: Completed vaccines  Qualifies for Shingles Vaccine? Yes  Zostavax completed No   Shingrix Completed?: No.    Education has been provided regarding the importance of this vaccine. Patient has been advised to call insurance company to determine out of pocket expense if they have not yet received this vaccine. Advised may also receive vaccine at local pharmacy or Health Dept. Verbalized acceptance and understanding.  Screening Tests Health Maintenance  Topic Date Due  . HIV Screening  Never done  . MAMMOGRAM  12/23/2019  . INFLUENZA VACCINE  01/15/2020  . TETANUS/TDAP  12/18/2020  . COLONOSCOPY  07/01/2028  . COVID-19 Vaccine  Completed  . Hepatitis C Screening  Completed    Health Maintenance  Health Maintenance Due  Topic Date Due  . HIV Screening  Never done  . MAMMOGRAM  12/23/2019  . INFLUENZA VACCINE  01/15/2020    Colorectal cancer screening: Completed 07/01/18. Repeat every 10 years Mammogram status: Ordered and scheduled 02/07/20. Pt provided with contact info and advised to call to schedule appt.   Lung Cancer Screening: (Low Dose CT Chest recommended if Age 33-80 years, 30  pack-year currently smoking OR have quit w/in 15years.) does not qualify.   Additional Screening:  Hepatitis C Screening: Up to date  Vision Screening: Recommended annual ophthalmology exams for early detection of glaucoma and other disorders of the eye. Is the patient up to date with their annual eye exam?  Yes  Who is the provider or what is the name of the office in which the patient attends annual eye exams? [facility] in [place] If pt is not established with a provider, would they like to  be referred to a provider to establish care? No .   Dental Screening: Recommended annual dental exams for proper oral hygiene  Community Resource Referral / Chronic Care Management: CRR required this visit?  No   CCM required this visit?  No      Plan:     I have personally reviewed and noted the following in the patient's chart:   . Medical and social history . Use of alcohol, tobacco or illicit drugs  . Current medications and supplements . Functional ability and status . Nutritional status . Physical activity . Advanced directives . List of other physicians . Hospitalizations, surgeries, and ER visits in previous 12 months . Vitals . Screenings to include cognitive, depression, and falls . Referrals and appointments  In addition, I have reviewed and discussed with patient certain preventive protocols, quality metrics, and best practice recommendations. A written personalized care plan for preventive services as well as general preventive health recommendations were provided to patient.     Shyheem Whitham [place], [place]   01/23/107   Nurse Notes: Mammogram is scheduled for 02/07/20.

## 2020-01-20 ENCOUNTER — Ambulatory Visit: Payer: Medicare Other | Admitting: Family Medicine

## 2020-01-20 ENCOUNTER — Other Ambulatory Visit: Payer: Self-pay

## 2020-01-20 ENCOUNTER — Ambulatory Visit (INDEPENDENT_AMBULATORY_CARE_PROVIDER_SITE_OTHER): Payer: Medicare Other | Admitting: Physician Assistant

## 2020-01-20 ENCOUNTER — Ambulatory Visit: Payer: Medicare Other

## 2020-01-20 ENCOUNTER — Encounter: Payer: Self-pay | Admitting: Physician Assistant

## 2020-01-20 VITALS — BP 118/68 | HR 60 | Temp 98.9°F | Ht 69.0 in | Wt 231.8 lb

## 2020-01-20 DIAGNOSIS — Z1231 Encounter for screening mammogram for malignant neoplasm of breast: Secondary | ICD-10-CM | POA: Diagnosis not present

## 2020-01-20 DIAGNOSIS — F331 Major depressive disorder, recurrent, moderate: Secondary | ICD-10-CM | POA: Diagnosis not present

## 2020-01-20 DIAGNOSIS — Z0000 Encounter for general adult medical examination without abnormal findings: Secondary | ICD-10-CM

## 2020-01-20 DIAGNOSIS — I10 Essential (primary) hypertension: Secondary | ICD-10-CM

## 2020-01-20 DIAGNOSIS — F411 Generalized anxiety disorder: Secondary | ICD-10-CM | POA: Diagnosis not present

## 2020-01-20 DIAGNOSIS — E890 Postprocedural hypothyroidism: Secondary | ICD-10-CM

## 2020-01-20 NOTE — Patient Instructions (Signed)

## 2020-01-23 ENCOUNTER — Ambulatory Visit (INDEPENDENT_AMBULATORY_CARE_PROVIDER_SITE_OTHER): Payer: Medicare Other

## 2020-01-23 ENCOUNTER — Other Ambulatory Visit: Payer: Self-pay

## 2020-01-23 VITALS — BP 105/70 | HR 59 | Temp 98.7°F | Ht 68.0 in | Wt 234.8 lb

## 2020-01-23 DIAGNOSIS — Z0000 Encounter for general adult medical examination without abnormal findings: Secondary | ICD-10-CM | POA: Diagnosis not present

## 2020-01-23 DIAGNOSIS — I10 Essential (primary) hypertension: Secondary | ICD-10-CM | POA: Diagnosis not present

## 2020-01-23 DIAGNOSIS — E890 Postprocedural hypothyroidism: Secondary | ICD-10-CM | POA: Diagnosis not present

## 2020-01-23 NOTE — Patient Instructions (Signed)
Claire Strong , Thank you for taking time to come for your Medicare Wellness Visit. I appreciate your ongoing commitment to your health goals. Please review the following plan we discussed and let me know if I can assist you in the future.   Screening recommendations/referrals: Colonoscopy: Up to date, due 06/2028 Mammogram: Currently due, scheduled for 02/07/20 Recommended yearly ophthalmology/optometry visit for glaucoma screening and checkup Recommended yearly dental visit for hygiene and checkup  Vaccinations: Influenza vaccine: Done 03/10/19 Tdap vaccine: Up to date, due 09/2020    Advanced directives: Please bring a copy of your POA (Power of [place]) and/or Living Will to your next appointment.   Conditions/risks identified: Fall risk prevention discussed today.   Next appointment: 07/23/20 @ 8:00 AM with Dr Brita Romp   Preventive Care 40-64 Years, Female Preventive care refers to lifestyle choices and visits with your health care provider that can promote health and wellness. What does preventive care include?  A yearly physical exam. This is also called an annual well check.  Dental exams once or twice a year.  Routine eye exams. Ask your health care provider how often you should have your eyes checked.  Personal lifestyle choices, including:  Daily care of your teeth and gums.  Regular physical activity.  Eating a healthy diet.  Avoiding tobacco and drug use.  Limiting alcohol use.  Practicing safe sex.  Taking low-dose aspirin daily starting at age 52.  Taking vitamin and mineral supplements as recommended by your health care provider. What happens during an annual well check? The services and screenings done by your health care provider during your annual well check will depend on your age, overall health, lifestyle risk factors, and family history of disease. Counseling  Your health care provider may ask you questions about your:  Alcohol use.  Tobacco  use.  Drug use.  Emotional well-being.  Home and relationship well-being.  Sexual activity.  Eating habits.  Work and work Statistician.  Method of birth control.  Menstrual cycle.  Pregnancy history. Screening  You may have the following tests or measurements:  Height, weight, and BMI.  Blood pressure.  Lipid and cholesterol levels. These may be checked every 5 years, or more frequently if you are over 19 years old.  Skin check.  Lung cancer screening. You may have this screening every year starting at age 37 if you have a 30-pack-year history of smoking and currently smoke or have quit within the past 15 years.  Fecal occult blood test (FOBT) of the stool. You may have this test every year starting at age 69.  Flexible sigmoidoscopy or colonoscopy. You may have a sigmoidoscopy every 5 years or a colonoscopy every 10 years starting at age 21.  Hepatitis C blood test.  Hepatitis B blood test.  Sexually transmitted disease (STD) testing.  Diabetes screening. This is done by checking your blood sugar (glucose) after you have not eaten for a while (fasting). You may have this done every 1-3 years.  Mammogram. This may be done every 1-2 years. Talk to your health care provider about when you should start having regular mammograms. This may depend on whether you have a family history of breast cancer.  BRCA-related cancer screening. This may be done if you have a family history of breast, ovarian, tubal, or peritoneal cancers.  Pelvic exam and Pap test. This may be done every 3 years starting at age 37. Starting at age 58, this may be done every 5 years if you have  a Pap test in combination with an HPV test.  Bone density scan. This is done to screen for osteoporosis. You may have this scan if you are at high risk for osteoporosis. Discuss your test results, treatment options, and if necessary, the need for more tests with your health care provider. Vaccines  Your  health care provider may recommend certain vaccines, such as:  Influenza vaccine. This is recommended every year.  Tetanus, diphtheria, and acellular pertussis (Tdap, Td) vaccine. You may need a Td booster every 10 years.  Zoster vaccine. You may need this after age 42.  Pneumococcal 13-valent conjugate (PCV13) vaccine. You may need this if you have certain conditions and were not previously vaccinated.  Pneumococcal polysaccharide (PPSV23) vaccine. You may need one or two doses if you smoke cigarettes or if you have certain conditions. Talk to your health care provider about which screenings and vaccines you need and how often you need them. This information is not intended to replace advice given to you by your health care provider. Make sure you discuss any questions you have with your health care provider. Document Released: 06/29/2015 Document Revised: 02/20/2016 Document Reviewed: 04/03/2015 Elsevier Interactive Patient Education  2017 [employer] Prevention in the Home Falls can cause injuries. They can happen to people of all ages. There are many things you can do to make your home safe and to help prevent falls. What can I do on the outside of my home?  Regularly fix the edges of walkways and driveways and fix any cracks.  Remove anything that might make you trip as you walk through a door, such as a raised step or threshold.  Trim any bushes or trees on the path to your home.  Use bright outdoor lighting.  Clear any walking paths of anything that might make someone trip, such as rocks or tools.  Regularly check to see if handrails are loose or broken. Make sure that both sides of any steps have handrails.  Any raised decks and porches should have guardrails on the edges.  Have any leaves, snow, or ice cleared regularly.  Use sand or salt on walking paths during winter.  Clean up any spills in your garage right away. This includes oil or grease  spills. What can I do in the bathroom?  Use night lights.  Install grab bars by the toilet and in the tub and shower. Do not use towel bars as grab bars.  Use non-skid mats or decals in the tub or shower.  If you need to sit down in the shower, use a plastic, non-slip stool.  Keep the floor dry. Clean up any water that spills on the floor as soon as it happens.  Remove soap buildup in the tub or shower regularly.  Attach bath mats securely with double-sided non-slip rug tape.  Do not have throw rugs and other things on the floor that can make you trip. What can I do in the bedroom?  Use night lights.  Make sure that you have a light by your bed that is easy to reach.  Do not use any sheets or blankets that are too big for your bed. They should not hang down onto the floor.  Have a firm chair that has side arms. You can use this for support while you get dressed.  Do not have throw rugs and other things on the floor that can make you trip. What can I do in the kitchen?  Clean up any spills right away.  Avoid walking on wet floors.  Keep items that you use a lot in easy-to-reach places.  If you need to reach something above you, use a strong step stool that has a grab bar.  Keep electrical cords out of the way.  Do not use floor polish or wax that makes floors slippery. If you must use wax, use non-skid floor wax.  Do not have throw rugs and other things on the floor that can make you trip. What can I do with my stairs?  Do not leave any items on the stairs.  Make sure that there are handrails on both sides of the stairs and use them. Fix handrails that are broken or loose. Make sure that handrails are as long as the stairways.  Check any carpeting to make sure that it is firmly attached to the stairs. Fix any carpet that is loose or worn.  Avoid having throw rugs at the top or bottom of the stairs. If you do have throw rugs, attach them to the floor with carpet  tape.  Make sure that you have a light switch at the top of the stairs and the bottom of the stairs. If you do not have them, ask someone to add them for you. What else can I do to help prevent falls?  Wear shoes that:  Do not have high heels.  Have rubber bottoms.  Are comfortable and fit you well.  Are closed at the toe. Do not wear sandals.  If you use a stepladder:  Make sure that it is fully opened. Do not climb a closed stepladder.  Make sure that both sides of the stepladder are locked into place.  Ask someone to hold it for you, if possible.  Clearly mark and make sure that you can see:  Any grab bars or handrails.  First and last steps.  Where the edge of each step is.  Use tools that help you move around (mobility aids) if they are needed. These include:  Canes.  Walkers.  Scooters.  Crutches.  Turn on the lights when you go into a dark area. Replace any light bulbs as soon as they burn out.  Set up your furniture so you have a clear path. Avoid moving your furniture around.  If any of your floors are uneven, fix them.  If there are any pets around you, be aware of where they are.  Review your medicines with your doctor. Some medicines can make you feel dizzy. This can increase your chance of falling. Ask your doctor what other things that you can do to help prevent falls. This information is not intended to replace advice given to you by your health care provider. Make sure you discuss any questions you have with your health care provider. Document Released: 03/29/2009 Document Revised: 11/08/2015 Document Reviewed: 07/07/2014 Elsevier Interactive Patient Education  2017 [employer].

## 2020-01-24 LAB — COMPREHENSIVE METABOLIC PANEL
ALT: 18 IU/L (ref 0–32)
AST: 21 IU/L (ref 0–40)
Albumin/Globulin Ratio: 1.9 (ref 1.2–2.2)
Albumin: 3.8 g/dL (ref 3.8–4.9)
Alkaline Phosphatase: 102 IU/L (ref 48–121)
BUN/Creatinine Ratio: 14 (ref 9–23)
BUN: 14 mg/dL (ref 6–24)
Bilirubin Total: 0.4 mg/dL (ref 0.0–1.2)
CO2: 24 mmol/L (ref 20–29)
Calcium: 8.2 mg/dL — ABNORMAL LOW (ref 8.7–10.2)
Chloride: 103 mmol/L (ref 96–106)
Creatinine, Ser: 1 mg/dL (ref 0.57–1.00)
GFR calc Af Amer: 72 mL/min/{1.73_m2} (ref >59)
GFR calc non Af Amer: 62 mL/min/{1.73_m2} (ref >59)
Globulin, Total: 2 g/dL (ref 1.5–4.5)
Glucose: 73 mg/dL (ref 65–99)
Potassium: 4.6 mmol/L (ref 3.5–5.2)
Sodium: 141 mmol/L (ref 134–144)
Total Protein: 5.8 g/dL — ABNORMAL LOW (ref 6.0–8.5)

## 2020-01-24 LAB — LIPID PANEL
Chol/HDL Ratio: 3.2 ratio (ref 0.0–4.4)
Cholesterol, Total: 168 mg/dL (ref 100–199)
HDL: 52 mg/dL (ref >39)
LDL Chol Calc (NIH): 88 mg/dL (ref 0–99)
Triglycerides: 166 mg/dL — ABNORMAL HIGH (ref 0–149)
VLDL Cholesterol Cal: 28 mg/dL (ref 5–40)

## 2020-02-07 ENCOUNTER — Ambulatory Visit
Admission: RE | Admit: 2020-02-07 | Discharge: 2020-02-07 | Disposition: A | Discharge status: Home / Self Care | Payer: Medicare Other | Source: Ambulatory Visit | Attending: Physician Assistant | Admitting: Physician Assistant

## 2020-02-07 ENCOUNTER — Other Ambulatory Visit: Payer: Self-pay

## 2020-02-07 DIAGNOSIS — Z1231 Encounter for screening mammogram for malignant neoplasm of breast: Secondary | ICD-10-CM | POA: Insufficient documentation

## 2020-02-09 DIAGNOSIS — M79672 Pain in left foot: Secondary | ICD-10-CM | POA: Diagnosis not present

## 2020-02-09 DIAGNOSIS — D2372 Other benign neoplasm of skin of left lower limb, including hip: Secondary | ICD-10-CM | POA: Diagnosis not present

## 2020-04-19 ENCOUNTER — Other Ambulatory Visit: Payer: Self-pay | Admitting: Family Medicine

## 2020-04-19 DIAGNOSIS — M79672 Pain in left foot: Secondary | ICD-10-CM | POA: Diagnosis not present

## 2020-04-19 DIAGNOSIS — M2042 Other hammer toe(s) (acquired), left foot: Secondary | ICD-10-CM | POA: Diagnosis not present

## 2020-04-19 DIAGNOSIS — D2372 Other benign neoplasm of skin of left lower limb, including hip: Secondary | ICD-10-CM | POA: Diagnosis not present

## 2020-04-24 ENCOUNTER — Other Ambulatory Visit
Admission: RE | Admit: 2020-04-24 | Discharge: 2020-04-24 | Disposition: A | Discharge status: Home / Self Care | Payer: Medicare Other | Attending: Otolaryngology | Admitting: Otolaryngology

## 2020-04-24 ENCOUNTER — Telehealth: Payer: Self-pay

## 2020-04-24 NOTE — Telephone Encounter (Signed)
LMTCB 04/24/2020.  PEC Please advise pt when she calls back.   Thanks,   -Mickel Baas

## 2020-04-24 NOTE — Telephone Encounter (Signed)
Please review if pt ok to have booster. If so, will route to scheduling. Thanks!

## 2020-04-24 NOTE — Telephone Encounter (Signed)
Copied from [place] [id number]. Topic: General - Other >> Apr 24, 2020  3:47 PM Hinda Lenis D wrote: PT receive Levan Hurst 09/26/19 she wants to schedule her booster and Flu vaccine at the same time / please advise

## 2020-04-24 NOTE — Telephone Encounter (Signed)
She does qualify for booster vaccine. Can have at same time as flu vaccine. However, we only have Pfizer vaccine.  Typically recommend staying with the brand that you started with, but can mix/match if desired.  Would be able to get Moderna booster at pharmacy if she prefers.

## 2020-04-25 NOTE — Telephone Encounter (Signed)
Call goes directly to voice mail. LMTCB. Also sent a detailed mychart message to patient.

## 2020-04-26 ENCOUNTER — Other Ambulatory Visit
Admission: RE | Admit: 2020-04-26 | Discharge: 2020-04-26 | Disposition: A | Discharge status: Home / Self Care | Payer: Medicare Other | Attending: Otolaryngology | Admitting: Otolaryngology

## 2020-04-26 DIAGNOSIS — E890 Postprocedural hypothyroidism: Secondary | ICD-10-CM | POA: Diagnosis not present

## 2020-04-26 LAB — T4, FREE: Free T4: 0.86 ng/dL (ref 0.61–1.12)

## 2020-04-26 LAB — TSH: TSH: 0.882 u[IU]/mL (ref 0.350–4.500)

## 2020-04-27 LAB — T3, FREE: T3, Free: 2.6 pg/mL (ref 2.0–4.4)

## 2020-04-30 ENCOUNTER — Other Ambulatory Visit: Payer: Self-pay

## 2020-04-30 ENCOUNTER — Ambulatory Visit (INDEPENDENT_AMBULATORY_CARE_PROVIDER_SITE_OTHER): Payer: Medicare Other | Admitting: Family Medicine

## 2020-04-30 ENCOUNTER — Encounter: Payer: Self-pay | Admitting: Family Medicine

## 2020-04-30 DIAGNOSIS — Z23 Encounter for immunization: Secondary | ICD-10-CM

## 2020-04-30 NOTE — Progress Notes (Signed)
Patient here for flu vaccination only.  I did not examine the patient.  I did review his medical history, medications, and allergies and vaccine consent form.  CMA gave vaccination. Patient tolerated well.  Virginia Crews, MD, MPH [facility] 04/30/2020 11:39 AM

## 2020-05-03 DIAGNOSIS — M2042 Other hammer toe(s) (acquired), left foot: Secondary | ICD-10-CM | POA: Diagnosis not present

## 2020-05-21 DIAGNOSIS — R8290 Unspecified abnormal findings in urine: Secondary | ICD-10-CM | POA: Diagnosis not present

## 2020-05-21 DIAGNOSIS — M545 Low back pain, unspecified: Secondary | ICD-10-CM | POA: Diagnosis not present

## 2020-05-21 DIAGNOSIS — M47816 Spondylosis without myelopathy or radiculopathy, lumbar region: Secondary | ICD-10-CM | POA: Diagnosis not present

## 2020-05-21 DIAGNOSIS — I739 Peripheral vascular disease, unspecified: Secondary | ICD-10-CM | POA: Diagnosis not present

## 2020-05-21 DIAGNOSIS — R300 Dysuria: Secondary | ICD-10-CM | POA: Diagnosis not present

## 2020-05-24 ENCOUNTER — Other Ambulatory Visit: Payer: Self-pay | Admitting: Family Medicine

## 2020-05-24 DIAGNOSIS — M47816 Spondylosis without myelopathy or radiculopathy, lumbar region: Secondary | ICD-10-CM | POA: Diagnosis not present

## 2020-05-31 DIAGNOSIS — J301 Allergic rhinitis due to pollen: Secondary | ICD-10-CM | POA: Diagnosis not present

## 2020-05-31 DIAGNOSIS — M953 Acquired deformity of neck: Secondary | ICD-10-CM | POA: Diagnosis not present

## 2020-05-31 DIAGNOSIS — E890 Postprocedural hypothyroidism: Secondary | ICD-10-CM | POA: Diagnosis not present

## 2020-06-01 ENCOUNTER — Other Ambulatory Visit: Payer: Medicare Other

## 2020-06-04 ENCOUNTER — Ambulatory Visit: Payer: Medicare Other | Admitting: Oncology

## 2020-06-04 ENCOUNTER — Ambulatory Visit: Payer: Medicare Other

## 2020-06-06 ENCOUNTER — Telehealth: Payer: Self-pay

## 2020-06-06 NOTE — Telephone Encounter (Signed)
Copied from [place] [id number]. Topic: General - Other >> Jun 06, 2020  4:28 PM Yvette Rack wrote: Reason for CRM: Pt stated she received a message to call the office. Pt requests call back

## 2020-06-18 ENCOUNTER — Inpatient Hospital Stay: Payer: Medicare Other | Attending: Oncology

## 2020-06-18 DIAGNOSIS — R5383 Other fatigue: Secondary | ICD-10-CM | POA: Diagnosis not present

## 2020-06-18 DIAGNOSIS — Z87891 Personal history of nicotine dependence: Secondary | ICD-10-CM | POA: Insufficient documentation

## 2020-06-18 DIAGNOSIS — Z818 Family history of other mental and behavioral disorders: Secondary | ICD-10-CM | POA: Diagnosis not present

## 2020-06-18 DIAGNOSIS — E039 Hypothyroidism, unspecified: Secondary | ICD-10-CM | POA: Diagnosis not present

## 2020-06-18 DIAGNOSIS — Z79890 Hormone replacement therapy (postmenopausal): Secondary | ICD-10-CM | POA: Diagnosis not present

## 2020-06-18 DIAGNOSIS — Z811 Family history of alcohol abuse and dependence: Secondary | ICD-10-CM | POA: Diagnosis not present

## 2020-06-18 DIAGNOSIS — Z801 Family history of malignant neoplasm of trachea, bronchus and lung: Secondary | ICD-10-CM | POA: Insufficient documentation

## 2020-06-18 DIAGNOSIS — R1310 Dysphagia, unspecified: Secondary | ICD-10-CM | POA: Diagnosis not present

## 2020-06-18 DIAGNOSIS — Z833 Family history of diabetes mellitus: Secondary | ICD-10-CM | POA: Insufficient documentation

## 2020-06-18 DIAGNOSIS — Z808 Family history of malignant neoplasm of other organs or systems: Secondary | ICD-10-CM | POA: Diagnosis not present

## 2020-06-18 DIAGNOSIS — Z9884 Bariatric surgery status: Secondary | ICD-10-CM | POA: Insufficient documentation

## 2020-06-18 DIAGNOSIS — D509 Iron deficiency anemia, unspecified: Secondary | ICD-10-CM | POA: Insufficient documentation

## 2020-06-18 DIAGNOSIS — Z8249 Family history of ischemic heart disease and other diseases of the circulatory system: Secondary | ICD-10-CM | POA: Diagnosis not present

## 2020-06-18 DIAGNOSIS — Z79899 Other long term (current) drug therapy: Secondary | ICD-10-CM | POA: Diagnosis not present

## 2020-06-18 DIAGNOSIS — E876 Hypokalemia: Secondary | ICD-10-CM | POA: Diagnosis not present

## 2020-06-18 DIAGNOSIS — Z8349 Family history of other endocrine, nutritional and metabolic diseases: Secondary | ICD-10-CM | POA: Diagnosis not present

## 2020-06-18 DIAGNOSIS — I10 Essential (primary) hypertension: Secondary | ICD-10-CM | POA: Insufficient documentation

## 2020-06-18 DIAGNOSIS — D508 Other iron deficiency anemias: Secondary | ICD-10-CM

## 2020-06-18 LAB — CBC WITH DIFFERENTIAL/PLATELET
Abs Immature Granulocytes: 0.02 10*3/uL (ref 0.00–0.07)
Basophils Absolute: 0 10*3/uL (ref 0.0–0.1)
Basophils Relative: 0 %
Eosinophils Absolute: 0.1 10*3/uL (ref 0.0–0.5)
Eosinophils Relative: 2 %
HCT: 35.8 % — ABNORMAL LOW (ref 36.0–46.0)
Hemoglobin: 12.3 g/dL (ref 12.0–15.0)
Immature Granulocytes: 0 %
Lymphocytes Relative: 41 %
Lymphs Abs: 2.1 10*3/uL (ref 0.7–4.0)
MCH: 30.8 pg (ref 26.0–34.0)
MCHC: 34.4 g/dL (ref 30.0–36.0)
MCV: 89.5 fL (ref 80.0–100.0)
Monocytes Absolute: 0.3 10*3/uL (ref 0.1–1.0)
Monocytes Relative: 6 %
Neutro Abs: 2.5 10*3/uL (ref 1.7–7.7)
Neutrophils Relative %: 51 %
Platelets: 197 10*3/uL (ref 150–400)
RBC: 4 MIL/uL (ref 3.87–5.11)
RDW: 12.2 % (ref 11.5–15.5)
WBC: 5 10*3/uL (ref 4.0–10.5)
nRBC: 0 % (ref 0.0–0.2)

## 2020-06-18 LAB — COMPREHENSIVE METABOLIC PANEL
ALT: 21 U/L (ref 0–44)
AST: 24 U/L (ref 15–41)
Albumin: 3.5 g/dL (ref 3.5–5.0)
Alkaline Phosphatase: 111 U/L (ref 38–126)
Anion gap: 10 (ref 5–15)
BUN: 20 mg/dL (ref 6–20)
CO2: 24 mmol/L (ref 22–32)
Calcium: 7.7 mg/dL — ABNORMAL LOW (ref 8.9–10.3)
Chloride: 102 mmol/L (ref 98–111)
Creatinine, Ser: 0.91 mg/dL (ref 0.44–1.00)
GFR, Estimated: >60 mL/min (ref >60)
Glucose, Bld: 88 mg/dL (ref 70–99)
Potassium: 3.3 mmol/L — ABNORMAL LOW (ref 3.5–5.1)
Sodium: 136 mmol/L (ref 135–145)
Total Bilirubin: 0.5 mg/dL (ref 0.3–1.2)
Total Protein: 6 g/dL — ABNORMAL LOW (ref 6.5–8.1)

## 2020-06-18 LAB — FERRITIN: Ferritin: 38 ng/mL (ref 11–307)

## 2020-06-18 LAB — VITAMIN B12: Vitamin B-12: 2161 pg/mL — ABNORMAL HIGH (ref 180–914)

## 2020-06-18 LAB — IRON AND TIBC
Iron: 36 ug/dL (ref 28–170)
Saturation Ratios: 10 % — ABNORMAL LOW (ref 10.4–31.8)
TIBC: 347 ug/dL (ref 250–450)
UIBC: 311 ug/dL

## 2020-06-19 ENCOUNTER — Encounter: Payer: Self-pay | Admitting: Oncology

## 2020-06-19 ENCOUNTER — Inpatient Hospital Stay: Payer: Medicare Other

## 2020-06-19 ENCOUNTER — Inpatient Hospital Stay (HOSPITAL_BASED_OUTPATIENT_CLINIC_OR_DEPARTMENT_OTHER): Payer: Medicare Other | Admitting: Oncology

## 2020-06-19 ENCOUNTER — Other Ambulatory Visit: Payer: Self-pay

## 2020-06-19 VITALS — BP 96/60 | HR 56 | Temp 98.1°F | Resp 18 | Wt 232.0 lb

## 2020-06-19 DIAGNOSIS — Z87891 Personal history of nicotine dependence: Secondary | ICD-10-CM | POA: Diagnosis not present

## 2020-06-19 DIAGNOSIS — E039 Hypothyroidism, unspecified: Secondary | ICD-10-CM | POA: Diagnosis not present

## 2020-06-19 DIAGNOSIS — R1310 Dysphagia, unspecified: Secondary | ICD-10-CM | POA: Diagnosis not present

## 2020-06-19 DIAGNOSIS — I10 Essential (primary) hypertension: Secondary | ICD-10-CM | POA: Diagnosis not present

## 2020-06-19 DIAGNOSIS — D508 Other iron deficiency anemias: Secondary | ICD-10-CM

## 2020-06-19 DIAGNOSIS — Z801 Family history of malignant neoplasm of trachea, bronchus and lung: Secondary | ICD-10-CM | POA: Diagnosis not present

## 2020-06-19 DIAGNOSIS — D509 Iron deficiency anemia, unspecified: Secondary | ICD-10-CM | POA: Diagnosis not present

## 2020-06-19 DIAGNOSIS — E876 Hypokalemia: Secondary | ICD-10-CM | POA: Diagnosis not present

## 2020-06-19 DIAGNOSIS — R5383 Other fatigue: Secondary | ICD-10-CM | POA: Diagnosis not present

## 2020-06-19 DIAGNOSIS — Z8249 Family history of ischemic heart disease and other diseases of the circulatory system: Secondary | ICD-10-CM | POA: Diagnosis not present

## 2020-06-19 DIAGNOSIS — Z808 Family history of malignant neoplasm of other organs or systems: Secondary | ICD-10-CM | POA: Diagnosis not present

## 2020-06-19 DIAGNOSIS — Z79899 Other long term (current) drug therapy: Secondary | ICD-10-CM | POA: Diagnosis not present

## 2020-06-19 DIAGNOSIS — Z9884 Bariatric surgery status: Secondary | ICD-10-CM | POA: Diagnosis not present

## 2020-06-19 DIAGNOSIS — Z8349 Family history of other endocrine, nutritional and metabolic diseases: Secondary | ICD-10-CM | POA: Diagnosis not present

## 2020-06-19 DIAGNOSIS — Z833 Family history of diabetes mellitus: Secondary | ICD-10-CM | POA: Diagnosis not present

## 2020-06-19 MED ORDER — SODIUM CHLORIDE 0.9 % IV SOLN
INTRAVENOUS | Status: DC
  Filled 2020-06-19: qty 250

## 2020-06-19 MED ORDER — SODIUM CHLORIDE 0.9 % IV SOLN
510.0000 mg | Freq: Once | INTRAVENOUS | Status: AC
  Administered 2020-06-19: 510 mg via INTRAVENOUS
  Filled 2020-06-19: qty 17

## 2020-06-19 NOTE — Progress Notes (Signed)
Hematology/Oncology progress note [facility] [phone] [phone]   Patient Care Team: Virginia Crews, MD as PCP - General (Family Medicine) Minna Merritts, MD as PCP - Cardiology (Cardiology) Samara Deist, DPM as Referring Physician (Podiatry) Schnier, Dolores Lory, MD (Vascular Surgery) Hessie Knows, MD as Consulting Physician (Orthopedic Surgery) Earlie Server, MD as Consulting Physician (Hematology and Oncology)  REFERRING PROVIDER: Dr.Tahiliani CHIEF COMPLAINTS/REASON FOR VISIT:  Evaluation of iron deficiency anemia  HISTORY OF PRESENTING ILLNESS:  Claire Strong is a  60 y.o.  female with PMH listed below who was referred to me for evaluation of iron deficiency anemia Patient was recently referred to GI for evaluation of 3 months history of dysphagia with solid food.  Her previous labs showed low normal ferritin level at 15.Marland Kitchen Referred to me for further evaluation of iron deficiency.  She has a history of gastric bypass in 2006.  Previous colonoscopy showed hyperplastic polyps.   Associated signs and symptoms: Patient reports fatigue.  Denies SOB with exertion.  Denies weight loss, easy bruising, hematochezia, hemoptysis, hematuria. Context: History of gastric bypass History of iron deficiency: Yes, reports history of IV iron infusion.  Rectal bleeding: deneis Menstrual bleeding/ Vaginal bleeding : hysterotomy at age of 29  Hematemesis or hemoptysis : denies Last endoscopy:2006 Fatigue: Yes.  SOB: deneis   # Also history of DVT, after 1st pregnancy, history of positive lupus anticoagulant, per patient, repeat antibody panel was negative. Currently not on anticoagulation, on Aspirin.   History of basal cell carcinoma.   # EGD and colonoscopy on 07/01/2018 EGD shows reflux esophagitis.  No atypia or malignancy. Colonoscopy showed tubular adenoma.  Negative for high-grade dysplasia or malignancy. INTERVAL HISTORY Claire Strong is a 60 y.o. female who has above history reviewed by me today presents for follow up visit for management of iron deficiency anemia,  Problems and complaints are listed below: Patient reports feeling more fatigued lately. Patient has hypothyroidism and is on Synthroid 150 MCG daily no other new complaints.  Review of Systems  Constitutional: Positive for fatigue. Negative for appetite change, chills and fever.  HENT:   Negative for hearing loss and voice change.   Eyes: Negative for eye problems.  Respiratory: Negative for chest tightness and cough.   Cardiovascular: Negative for chest pain.  Gastrointestinal: Negative for abdominal distention, abdominal pain and blood in stool.  Endocrine: Negative for hot flashes.  Genitourinary: Negative for difficulty urinating and frequency.   Musculoskeletal: Negative for arthralgias.  Skin: Negative for itching and rash.  Neurological: Negative for extremity weakness.  Hematological: Negative for adenopathy. Does not bruise/bleed easily.  Psychiatric/Behavioral: Negative for confusion.    MEDICAL HISTORY:  Past Medical History:  Diagnosis Date  . Anxiety   . Basal cell carcinoma    nose  . Bleeding disorder ([place])   . Bronchial asthma   . Cancer ([place])    melanoma; right knee  . 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 Gigi Gin ortho)  . Chronic venous insufficiency 2007   s/p vein stripping and laser ablation  . Depression   . Diverticulosis    mild by colonoscopy  . History of DVT (deep vein thrombosis)    DVTs after 1st pregnancy, not on AC 2/2 bleeding ulcer, greenfield filter in place  . History of gastric ulcer 2009  . History of kidney stones   . Horseshoe kidney    sole, R kidney damage  .  HTN (hypertension)   . Hypothyroidism   . Kidney stones    s/p renal hematoma after lithotripsy on right  . Lupus anticoagulant disorder ([place])   . Multinodular goiter    s/p thyroidectomy  .  Nasal septal perforation    chronic, ENT rec avoid antihistamine, INS  . Osteoarthritis    ?FM by rheum  . Overweight   . Post-surgical hypothyroidism    for multinodular goiter  . 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 Frutoso Chase  . Seasonal allergies    allergy shots, singulair  . 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 Tachycardia     SURGICAL HISTORY: Past Surgical History:  Procedure Laterality Date  . ABDOMINAL HERNIA REPAIR    . ABDOMINAL HYSTERECTOMY    . ANKLE FUSION Left 05/2013  . APPENDECTOMY  1978  . BACK SURGERY  2009  . CESAREAN SECTION     X 2  . CHOLECYSTECTOMY  1998  . COLONOSCOPY  05/2012   hyperplastic polyps x2, mild diverticulosis Deatra Ina) rpt 10 yrs  . COLONOSCOPY WITH PROPOFOL N/A 07/01/2018   Procedure: COLONOSCOPY WITH PROPOFOL;  Surgeon: Virgel Manifold, MD;  Location: [facility] END[facility];  Service: Endoscopy;  Laterality: N/A;  . CYST EXCISION     back  and shoulder  . ESI Right 10/2015, 02/2016   C4/5 then C5/6 ESI ([place])  . ESI Bilateral 06/2015, 08/2015, 03/2016, 09/2017   L4/5 transforaminal ESI (Chasnis)  . ESOPHAGOGASTRODUODENOSCOPY (EGD) WITH PROPOFOL N/A 07/01/2018   Procedure: ESOPHAGOGASTRODUODENOSCOPY (EGD) WITH PROPOFOL;  Surgeon: Virgel Manifold, MD;  Location: [facility] END[facility];  Service: Endoscopy;  Laterality: N/A;  . FL HIP INJECTION ([place] HX) Left 11/2016   Dr Sharlet Salina  . FUSION OF TALONAVICULAR JOINT Left 09/12/2016   Procedure: FUSION OF TALONAVICULAR JOINT WITH TRINITY BONE GRAFT;  Surgeon: Samara Deist, DPM;  Location: [facility] OR[facility];  Service: Podiatry;  Laterality: Left;  Marland Kitchen GASTRIC BYPASS  05/21/05   Dr. Frutoso Chase, Roux-en-Y  . Greenfield filter removal  2015   removed 6 wks after surgery  . hammerhead toes Left   . HERNIA REPAIR     X 3  . IVC FILTER INSERTION N/A 09/09/2016   Procedure: IVC Filter Insertion;  Surgeon: Katha Cabal, MD;  Location: [facility];  Service: Cardiovascular;  Laterality: N/A;  . IVC FILTER  REMOVAL N/A 11/25/2016   Procedure: IVC Filter Removal;  Surgeon: Katha Cabal, MD;  Location: [facility];  Service: Cardiovascular;  Laterality: N/A;  . JOINT REPLACEMENT    . lyphoma excision  08/2015   arm  . NASAL SEPTUM SURGERY  1981   deviated septum, repaired at [facility]  . PARTIAL HYSTERECTOMY  1989   ovaries remain  . SKIN SURGERY     basel cell  . TONSILLECTOMY  1969  . TOTAL HIP ARTHROPLASTY Right 11/06/2015   Procedure: TOTAL HIP ARTHROPLASTY ANTERIOR APPROACH;  Surgeon: Hessie Knows, MD;  Location: [facility] OR[facility];  Service: Orthopedics;  Laterality: Right;  . TOTAL HIP ARTHROPLASTY Left 03/19/2017   Procedure: TOTAL HIP ARTHROPLASTY ANTERIOR APPROACH;  Surgeon: Hessie Knows, MD;  Location: [facility];  Service: Orthopedics;  Laterality: Left;  . TOTAL KNEE ARTHROPLASTY Left 08/2013  . TOTAL THYROIDECTOMY  09/2011   benign path (done for multinodular goiter concern for cancer)    SOCIAL HISTORY: Social History   Socioeconomic History  . Marital status: Married    Spouse name: Gwyndolyn Saxon  . Number of children: 2  .  Years of education: 32  . Highest education level: High school graduate  Occupational History  . Occupation: stay at home  Tobacco Use  . Smoking status: Former Smoker    Packs/day: 0.25    Types: Cigarettes    Quit date: 06/16/2004    Years since quitting: 16.0  . Smokeless tobacco: Never Used  . Tobacco comment: Socially-no longer  Vaping Use  . Vaping Use: Never used  Substance and Sexual Activity  . Alcohol use: Yes    Alcohol/week: 0.0 standard drinks    Comment: Occasionally-beer or bloody mary monthly  . Drug use: No  . Sexual activity: Yes    Partners: Male    Birth control/protection: Surgical  Other Topics Concern  . Not on file  Social History Narrative  . Not on file   Social Determinants of Health   Financial Resource Strain: Low Risk   . Difficulty of Paying Living Expenses: Not hard at all  Food Insecurity: No Food Insecurity   . Worried About Charity fundraiser in the Last Year: Never true  . Ran Out of Food in the Last Year: Never true  Transportation Needs: No Transportation Needs  . Lack of Transportation (Medical): No  . Lack of Transportation (Non-Medical): No  Physical Activity: Inactive  . Days of Exercise per Week: 0 days  . Minutes of Exercise per Session: 0 min  Stress: Stress Concern Present  . Feeling of Stress : To some extent  Social Connections: Moderately Isolated  . Frequency of Communication with Friends and Family: More than three times a week  . Frequency of Social Gatherings with Friends and Family: More than three times a week  . Attends Religious Services: Never  . Active Member of Clubs or Organizations: No  . Attends Archivist Meetings: Never  . Marital Status: Married  Human resources officer Violence: Not At Risk  . Fear of Current or Ex-Partner: No  . Emotionally Abused: No  . Physically Abused: No  . Sexually Abused: No    FAMILY HISTORY: Family History  Problem Relation Age of Onset  . Diabetes Mother        prediabetes  . Alzheimer's disease Mother   . Hyperlipidemia Mother   . Diabetes Father   . Coronary artery disease Father 41       CABG  . Alcohol abuse Father   . Lung cancer Father   . Brain cancer Maternal Uncle   . Diabetes Paternal Grandmother   . Lung cancer Brother   . Alzheimer's disease Brother   . Diabetes Maternal Grandmother   . Stroke Neg Hx   . Colon cancer Neg Hx   . Stomach cancer Neg Hx   . Kidney disease Neg Hx   . Breast cancer Neg Hx   . Kidney cancer Neg Hx   . Bladder Cancer Neg Hx   . Ovarian cancer Neg Hx     ALLERGIES:  is allergic to sulfa antibiotics, sulfur, cefuroxime axetil, nutritional supplements, other, oxycodone-acetaminophen, and betadine [povidone iodine].  MEDICATIONS:  Current Outpatient Medications  Medication Sig Dispense Refill  . acetaminophen (TYLENOL) 500 MG tablet Take 1,000 mg by mouth 2 (two)  times daily.    Marland Kitchen albuterol (VENTOLIN HFA) 108 (90 Base) MCG/ACT inhaler Inhale 2 puffs into the lungs every 6 (six) hours as needed for wheezing or shortness of breath. 6.7 g 0  . Ascorbic Acid (VITAMIN C) 100 MG tablet Take by mouth daily. Unsure dose    .  aspirin 81 MG tablet Take 81 mg by mouth daily.    . benazepril (LOTENSIN) 10 MG tablet Take 1 tablet by mouth once daily 90 tablet 0  . bisacodyl (DULCOLAX) 5 MG EC tablet Take 5 mg by mouth daily as needed for moderate constipation.    Marland Kitchen buPROPion (WELLBUTRIN SR) 150 MG 12 hr tablet Take 1 tablet by mouth twice daily 180 tablet 0  . calcium carbonate (CALCIUM 600) 600 MG TABS tablet Take 1 tablet (600 mg total) 2 (two) times daily with a meal by mouth. 60 tablet   . cetirizine (ZYRTEC) 10 MG tablet Take 10 mg by mouth daily.    . Cholecalciferol (VITAMIN D3) 1000 UNITS tablet Take 2,000 Units by mouth daily.    . citalopram (CELEXA) 40 MG tablet Take 1 tablet by mouth once daily 90 tablet 0  . cyclobenzaprine (FLEXERIL) 10 MG tablet Take 10 mg by mouth 3 (three) times daily as needed.     Marland Kitchen EPINEPHrine (EPI-PEN) 0.3 mg/0.3 mL DEVI Inject 0.3 mLs (0.3 mg total) into the muscle once. (Patient taking differently: Inject 0.3 mg into the muscle once.) 1 Device 2  . estradiol (ESTRACE) 2 MG tablet Take 1 tablet by mouth daily.   0  . Fluticasone-Salmeterol (ADVAIR DISKUS) 100-50 MCG/DOSE AEPB Inhale 1 puff into the lungs 2 (two) times daily. 60 each 5  . furosemide (LASIX) 20 MG tablet Take 1 tablet (20 mg total) by mouth 2 (two) times daily. 180 tablet 1  . levothyroxine (SYNTHROID) 150 MCG tablet Take 150 mcg by mouth daily before breakfast.     . liothyronine (CYTOMEL) 5 MCG tablet Take 5 mcg by mouth daily.     . metoprolol tartrate (LOPRESSOR) 25 MG tablet TAKE 1 TABLET BY MOUTH TWICE DAILY. 60 tablet 7  . montelukast (SINGULAIR) 10 MG tablet Take 10 mg by mouth every morning.    . ondansetron (ZOFRAN) 4 MG tablet Take 1 tablet (4 mg total)  by mouth every 8 (eight) hours as needed for nausea or vomiting. 30 tablet 0  . pantoprazole (PROTONIX) 20 MG tablet Take 1 tablet by mouth twice daily 60 tablet 2  . solifenacin (VESICARE) 5 MG tablet Take 5 mg by mouth daily.     . SUMAtriptan (IMITREX) 50 MG tablet Take 50 mg by mouth every 2 (two) hours as needed for migraine. May repeat in 2 hours if headache persists or recurs.    . traZODone (DESYREL) 50 MG tablet TAKE 1/2 TO 1 (ONE-HALF TO ONE) TABLET BY MOUTH AT BEDTIME AS NEEDED FOR SLEEP 90 tablet 0  . vitamin B-12 (CYANOCOBALAMIN) 500 MCG tablet Take 1 tablet (500 mcg total) by mouth daily. 90 tablet 1  . zinc gluconate 50 MG tablet Take 50 mg by mouth daily.    . ACETAMINOPHEN-BUTALBITAL 50-325 MG TABS Take 1 tablet by mouth at bedtime as needed (sleep). Reported on 09/03/2015 3 times a week (Patient not taking: No sig reported)    . levothyroxine (SYNTHROID) 175 MCG tablet TAKE 1 TABLET BY MOUTH EVERY OTHER DAY (ALTERNATE WITH 150MCG EVERY OTHER DAY) (Patient not taking: Reported on 12/05/2019)    . predniSONE (DELTASONE) 10 MG tablet Take 60mg  PO daily x1d, then 50mg  daily x1d, then 40mg  daily x1d, then 30mg  daily x1d, then 20mg  daily x1d, then 10mg  daily x1d, then stop (Patient not taking: Reported on 12/05/2019) 21 tablet 0  . Turmeric 500 MG CAPS Take 1 capsule by mouth 2 (two) times daily. (Patient not  taking: No sig reported)     No current facility-administered medications for this visit.     PHYSICAL EXAMINATION: ECOG PERFORMANCE STATUS: 1 - Symptomatic but completely ambulatory Vitals:   06/19/20 1309  BP: 96/60  Pulse: (!) 56  Resp: 18  Temp: 98.1 F (36.7 C)   Filed Weights   06/19/20 1309  Weight: 232 lb (105.2 kg)    Physical Exam Constitutional:      General: She is not in acute distress. HENT:     Head: Normocephalic and atraumatic.  Eyes:     General: No scleral icterus.    Pupils: Pupils are equal, round, and reactive to light.  Cardiovascular:      Rate and Rhythm: Normal rate and regular rhythm.     Heart sounds: Normal heart sounds.  Pulmonary:     Effort: Pulmonary effort is normal. No respiratory distress.     Breath sounds: No wheezing.  Abdominal:     General: Bowel sounds are normal. There is no distension.     Palpations: Abdomen is soft.  Musculoskeletal:        General: No deformity. Normal range of motion.     Cervical back: Normal range of motion and neck supple.  Skin:    General: Skin is warm and dry.     Findings: No erythema or rash.  Neurological:     General: No focal deficit present.     Mental Status: She is alert. Mental status is at baseline.     Cranial Nerves: No cranial nerve deficit.     Coordination: Coordination normal.      LABORATORY DATA:  I have reviewed the data as listed Lab Results  Component Value Date   WBC 5.0 06/18/2020   HGB 12.3 06/18/2020   HCT 35.8 (L) 06/18/2020   MCV 89.5 06/18/2020   PLT 197 06/18/2020   Recent Labs    07/22/19 0922 01/23/20 0828 06/18/20 1254  NA 137 141 136  K 4.5 4.6 3.3*  CL 102 103 102  CO2 23 24 24   GLUCOSE 78 73 88  BUN 13 14 20   CREATININE 0.89 1.00 0.91  CALCIUM 8.2* 8.2* 7.7*  GFRNONAA 72 62 >60  GFRAA 83 72  --   PROT  --  5.8* 6.0*  ALBUMIN  --  3.8 3.5  AST  --  21 24  ALT  --  18 21  ALKPHOS  --  102 111  BILITOT  --  0.4 0.5   Iron/TIBC/Ferritin/ %Sat    Component Value Date/Time   IRON 36 06/18/2020 1254   IRON 54 02/03/2018 1033   TIBC 347 06/18/2020 1254   TIBC 358 02/03/2018 1033   FERRITIN 38 06/18/2020 1254   FERRITIN 16 02/03/2018 1033   IRONPCTSAT 10 (L) 06/18/2020 1254   IRONPCTSAT 15 02/03/2018 1033      ASSESSMENT & PLAN:  1. Other iron deficiency anemia   2. History of gastric bypass   3. Hypokalemia   4. Hypocalcemia    #History of iron deficiency anemia, history of gastric bypass Labs are reviewed and discussed with patient. Hemoglobin is stable and normal. Iron saturation has decreased to 10  and ferritin has decreased to 38. Recommend patient to proceed with 1 dose of Feraheme today.  Continue follow-up every 6 months for further evaluation of need of additional IV iron treatments.  #Vitamin B12 level is adequate.  I recommend patient to stop vitamin B12 supplementation for 3 months.  Resume weekly  vitamin B12 1000 MCG.  I will repeat B12 in the next visit.  #Hypocalcemia Calcium level is at 7.7, she is taking calcium 1000 mg daily.  Recommend patient to increase calcium to 1500 mg daily.  Check vitamin D level Repeat BMP in 4 weeks  #Hypokalemia, potassium level is 3.3.  Recommend patient to increase intake of potassium rich food. #On hormone replacement therapy-she has no family history of breast cancer.  She reports to be up-to-date for mammogram screening.  Orders Placed This Encounter  Procedures  . Basic metabolic panel    Standing Status:   Future    Standing Expiration Date:   06/19/2021  . Comprehensive metabolic panel    Standing Status:   Future    Standing Expiration Date:   06/19/2021  . CBC with Differential/Platelet    Standing Status:   Future    Standing Expiration Date:   06/19/2021  . Ferritin    Standing Status:   Future    Standing Expiration Date:   06/19/2021  . Iron and TIBC    Standing Status:   Future    Standing Expiration Date:   06/19/2021  . Vitamin B12    Standing Status:   Future    Standing Expiration Date:   06/19/2021  . Vitamin D 25 hydroxy    Standing Status:   Future    Standing Expiration Date:   06/19/2021    All questions were answered. The patient knows to call the clinic with any problems questions or concerns.  Return of visit:  6 months.  We spent sufficient time to discuss many aspect of care, questions were answered to patient's satisfaction.  Earlie Server, MD, PhD Hematology Oncology [facility] Pager- [phone] 06/19/2020

## 2020-06-19 NOTE — Progress Notes (Signed)
Patient reports her energy level is not improving.

## 2020-06-25 ENCOUNTER — Other Ambulatory Visit: Payer: Self-pay | Admitting: Family

## 2020-06-25 DIAGNOSIS — I471 Supraventricular tachycardia: Secondary | ICD-10-CM

## 2020-06-25 NOTE — Telephone Encounter (Signed)
Attempted to schedule.  LMOV to call office.  ° °

## 2020-06-25 NOTE — Telephone Encounter (Signed)
Scheduled

## 2020-06-25 NOTE — Telephone Encounter (Signed)
Please schedule F/U appointment. Thank you!

## 2020-06-25 NOTE — Telephone Encounter (Signed)
Rx request sent to pharmacy.  

## 2020-07-15 ENCOUNTER — Other Ambulatory Visit: Payer: Self-pay | Admitting: Family Medicine

## 2020-07-17 ENCOUNTER — Inpatient Hospital Stay: Payer: Medicare Other | Attending: Oncology

## 2020-07-17 DIAGNOSIS — D508 Other iron deficiency anemias: Secondary | ICD-10-CM | POA: Insufficient documentation

## 2020-07-17 LAB — BASIC METABOLIC PANEL
Anion gap: 9 (ref 5–15)
BUN: 22 mg/dL — ABNORMAL HIGH (ref 6–20)
CO2: 26 mmol/L (ref 22–32)
Calcium: 8.1 mg/dL — ABNORMAL LOW (ref 8.9–10.3)
Chloride: 100 mmol/L (ref 98–111)
Creatinine, Ser: 0.95 mg/dL (ref 0.44–1.00)
GFR, Estimated: >60 mL/min (ref >60)
Glucose, Bld: 88 mg/dL (ref 70–99)
Potassium: 3.2 mmol/L — ABNORMAL LOW (ref 3.5–5.1)
Sodium: 135 mmol/L (ref 135–145)

## 2020-07-17 LAB — VITAMIN D 25 HYDROXY (VIT D DEFICIENCY, FRACTURES): Vit D, 25-Hydroxy: 64.01 ng/mL (ref 30–100)

## 2020-07-18 ENCOUNTER — Other Ambulatory Visit: Payer: Self-pay | Admitting: Family Medicine

## 2020-07-18 ENCOUNTER — Other Ambulatory Visit: Payer: Self-pay | Admitting: Family

## 2020-07-18 DIAGNOSIS — I471 Supraventricular tachycardia: Secondary | ICD-10-CM

## 2020-07-19 DIAGNOSIS — M953 Acquired deformity of neck: Secondary | ICD-10-CM | POA: Diagnosis not present

## 2020-07-23 ENCOUNTER — Ambulatory Visit: Payer: Medicare Other | Admitting: Family Medicine

## 2020-07-24 ENCOUNTER — Other Ambulatory Visit: Payer: Self-pay | Admitting: Family Medicine

## 2020-07-24 ENCOUNTER — Encounter: Payer: Self-pay | Admitting: Otolaryngology

## 2020-07-31 ENCOUNTER — Other Ambulatory Visit: Payer: Self-pay

## 2020-07-31 ENCOUNTER — Other Ambulatory Visit
Admission: RE | Admit: 2020-07-31 | Discharge: 2020-07-31 | Disposition: A | Discharge status: Home / Self Care | Payer: Medicare Other | Source: Ambulatory Visit | Attending: Otolaryngology | Admitting: Otolaryngology

## 2020-07-31 DIAGNOSIS — Z01812 Encounter for preprocedural laboratory examination: Secondary | ICD-10-CM | POA: Diagnosis not present

## 2020-07-31 DIAGNOSIS — Z20822 Contact with and (suspected) exposure to covid-19: Secondary | ICD-10-CM | POA: Diagnosis not present

## 2020-07-31 LAB — SARS CORONAVIRUS 2 (TAT 6-24 HRS): SARS Coronavirus 2: NEGATIVE

## 2020-08-02 ENCOUNTER — Encounter
Admission: RE | Disposition: A | Discharge status: Home / Self Care | Payer: Self-pay | Source: Home / Self Care | Attending: Otolaryngology

## 2020-08-02 ENCOUNTER — Other Ambulatory Visit: Payer: Self-pay

## 2020-08-02 ENCOUNTER — Ambulatory Visit
Admission: RE | Admit: 2020-08-02 | Discharge: 2020-08-02 | Disposition: A | Discharge status: Home / Self Care | Payer: Medicare Other | Attending: Otolaryngology | Admitting: Otolaryngology

## 2020-08-02 ENCOUNTER — Ambulatory Visit: Payer: Medicare Other | Admitting: Anesthesiology

## 2020-08-02 ENCOUNTER — Encounter: Payer: Self-pay | Admitting: Otolaryngology

## 2020-08-02 DIAGNOSIS — Z9884 Bariatric surgery status: Secondary | ICD-10-CM | POA: Insufficient documentation

## 2020-08-02 DIAGNOSIS — Z428 Encounter for other plastic and reconstructive surgery following medical procedure or healed injury: Secondary | ICD-10-CM | POA: Diagnosis not present

## 2020-08-02 DIAGNOSIS — E890 Postprocedural hypothyroidism: Secondary | ICD-10-CM | POA: Insufficient documentation

## 2020-08-02 DIAGNOSIS — Z7982 Long term (current) use of aspirin: Secondary | ICD-10-CM | POA: Diagnosis not present

## 2020-08-02 DIAGNOSIS — L905 Scar conditions and fibrosis of skin: Secondary | ICD-10-CM | POA: Insufficient documentation

## 2020-08-02 DIAGNOSIS — R590 Localized enlarged lymph nodes: Secondary | ICD-10-CM | POA: Diagnosis not present

## 2020-08-02 DIAGNOSIS — L910 Hypertrophic scar: Secondary | ICD-10-CM | POA: Diagnosis not present

## 2020-08-02 DIAGNOSIS — Z87891 Personal history of nicotine dependence: Secondary | ICD-10-CM | POA: Diagnosis not present

## 2020-08-02 DIAGNOSIS — Z79899 Other long term (current) drug therapy: Secondary | ICD-10-CM | POA: Diagnosis not present

## 2020-08-02 DIAGNOSIS — M953 Acquired deformity of neck: Secondary | ICD-10-CM | POA: Diagnosis not present

## 2020-08-02 DIAGNOSIS — Z96652 Presence of left artificial knee joint: Secondary | ICD-10-CM | POA: Diagnosis not present

## 2020-08-02 DIAGNOSIS — Z90710 Acquired absence of both cervix and uterus: Secondary | ICD-10-CM | POA: Insufficient documentation

## 2020-08-02 DIAGNOSIS — Z96649 Presence of unspecified artificial hip joint: Secondary | ICD-10-CM | POA: Insufficient documentation

## 2020-08-02 DIAGNOSIS — Z882 Allergy status to sulfonamides status: Secondary | ICD-10-CM | POA: Insufficient documentation

## 2020-08-02 HISTORY — DX: Sleep apnea, unspecified: G47.30

## 2020-08-02 HISTORY — DX: Nausea with vomiting, unspecified: R11.2

## 2020-08-02 HISTORY — DX: Headache, unspecified: R51.9

## 2020-08-02 HISTORY — DX: Personal history of other diseases of the musculoskeletal system and connective tissue: Z87.39

## 2020-08-02 HISTORY — DX: Other complications of anesthesia, initial encounter: T88.59XA

## 2020-08-02 HISTORY — DX: Other specified postprocedural states: Z98.890

## 2020-08-02 HISTORY — PX: SCAR REVISION: SHX5285

## 2020-08-02 SURGERY — REVISION, SCAR
Anesthesia: General | Site: Neck

## 2020-08-02 MED ORDER — MIDAZOLAM HCL 5 MG/5ML IJ SOLN
INTRAMUSCULAR | Status: DC | PRN
  Administered 2020-08-02: 2 mg via INTRAVENOUS

## 2020-08-02 MED ORDER — LIDOCAINE HCL (PF) 1 % IJ SOLN
INTRAMUSCULAR | Status: DC | PRN
  Administered 2020-08-02: 10 mL

## 2020-08-02 MED ORDER — FENTANYL CITRATE (PF) 100 MCG/2ML IJ SOLN
INTRAMUSCULAR | Status: DC | PRN
  Administered 2020-08-02 (×2): 50 ug via INTRAVENOUS

## 2020-08-02 MED ORDER — PROPOFOL 10 MG/ML IV BOLUS
INTRAVENOUS | Status: DC | PRN
  Administered 2020-08-02: 130 mg via INTRAVENOUS

## 2020-08-02 MED ORDER — HYDROCODONE-ACETAMINOPHEN 5-325 MG PO TABS: 1.0000 | ORAL_TABLET | Freq: Four times a day (QID) | ORAL | 0 refills | Status: AC | PRN

## 2020-08-02 MED ORDER — BACITRACIN 500 UNIT/GM EX OINT
TOPICAL_OINTMENT | CUTANEOUS | Status: DC | PRN
  Administered 2020-08-02: 1 via TOPICAL

## 2020-08-02 MED ORDER — ONDANSETRON HCL 4 MG/2ML IJ SOLN
INTRAMUSCULAR | Status: DC | PRN
  Administered 2020-08-02: 4 mg via INTRAVENOUS

## 2020-08-02 MED ORDER — FENTANYL CITRATE (PF) 100 MCG/2ML IJ SOLN: 25.0000 ug | INTRAMUSCULAR | Status: DC | PRN

## 2020-08-02 MED ORDER — SCOPOLAMINE 1 MG/3DAYS TD PT72
1.0000 | MEDICATED_PATCH | Freq: Once | TRANSDERMAL | Status: DC
  Administered 2020-08-02: 1.5 mg via TRANSDERMAL

## 2020-08-02 MED ORDER — DEXAMETHASONE SODIUM PHOSPHATE 4 MG/ML IJ SOLN
INTRAMUSCULAR | Status: DC | PRN
  Administered 2020-08-02: 8 mg via INTRAVENOUS

## 2020-08-02 MED ORDER — LIDOCAINE HCL (CARDIAC) PF 100 MG/5ML IV SOSY
PREFILLED_SYRINGE | INTRAVENOUS | Status: DC | PRN
  Administered 2020-08-02: 50 mg via INTRATRACHEAL

## 2020-08-02 MED ORDER — GLYCOPYRROLATE 0.2 MG/ML IJ SOLN
INTRAMUSCULAR | Status: DC | PRN
  Administered 2020-08-02: .1 mg via INTRAVENOUS

## 2020-08-02 MED ORDER — MEPERIDINE HCL 25 MG/ML IJ SOLN: 6.2500 mg | INTRAMUSCULAR | Status: DC | PRN

## 2020-08-02 MED ORDER — PROMETHAZINE HCL 25 MG/ML IJ SOLN: 6.2500 mg | INTRAMUSCULAR | Status: DC | PRN

## 2020-08-02 MED ORDER — LACTATED RINGERS IV SOLN: INTRAVENOUS | Status: DC

## 2020-08-02 MED ORDER — EPHEDRINE SULFATE 50 MG/ML IJ SOLN
INTRAMUSCULAR | Status: DC | PRN
  Administered 2020-08-02: 5 mg via INTRAVENOUS
  Administered 2020-08-02: 10 mg via INTRAVENOUS

## 2020-08-02 SURGICAL SUPPLY — 23 items
BLADE BOVIE TIP EXT 4 (BLADE) ×1 IMPLANT
DRAPE HEAD BAR (DRAPES) ×2 IMPLANT
DRSG TEGADERM 4X4.75 (GAUZE/BANDAGES/DRESSINGS) ×2 IMPLANT
DRSG TELFA 4X3 1S NADH ST (GAUZE/BANDAGES/DRESSINGS) ×1 IMPLANT
ELECT REM PT RETURN 9FT ADLT (ELECTROSURGICAL) ×2
ELECTRODE REM PT RTRN 9FT ADLT (ELECTROSURGICAL) ×1 IMPLANT
GAUZE 4X4 16PLY RFD (DISPOSABLE) ×1 IMPLANT
GLOVE PI ULTRA LF STRL 7.5 (GLOVE) ×2 IMPLANT
GLOVE PI ULTRA NON LATEX 7.5 (GLOVE) ×2
GOWN STRL REUS W/ TWL LRG LVL3 (GOWN DISPOSABLE) ×1 IMPLANT
GOWN STRL REUS W/TWL LRG LVL3 (GOWN DISPOSABLE) ×2
KIT TURNOVER KIT A (KITS) ×2 IMPLANT
NDL HYPO 25GX1X1/2 BEV (NEEDLE) ×1 IMPLANT
NEEDLE HYPO 25GX1X1/2 BEV (NEEDLE) ×2 IMPLANT
NS IRRIG 500ML POUR BTL (IV SOLUTION) ×2 IMPLANT
PACK ENT CUSTOM (PACKS) ×2 IMPLANT
PENCIL SMOKE EVACUATOR (MISCELLANEOUS) ×2 IMPLANT
SLEEVE SUCTION 125 (MISCELLANEOUS) ×1 IMPLANT
STRAP BODY AND KNEE 60X3 (MISCELLANEOUS) ×2 IMPLANT
SUT PROLENE 6 0 P 1 18 (SUTURE) ×1 IMPLANT
SUT VIC AB 4-0 RB1 27 (SUTURE) ×8
SUT VIC AB 4-0 RB1 27X BRD (SUTURE) ×1 IMPLANT
SYR 10ML LL (SYRINGE) ×3 IMPLANT

## 2020-08-02 NOTE — Discharge Instructions (Signed)
General Anesthesia, Adult, Care After This sheet gives you information about how to care for yourself after your procedure. Your health care provider may also give you more specific instructions. If you have problems or questions, contact your health care provider. What can I expect after the procedure? After the procedure, the following side effects are common:  Pain or discomfort at the IV site.  Nausea.  Vomiting.  Sore throat.  Trouble concentrating.  Feeling cold or chills.  Feeling weak or tired.  Sleepiness and fatigue.  Soreness and body aches. These side effects can affect parts of the body that were not involved in surgery. Follow these instructions at home: For the time period you were told by your health care provider:  Rest.  Do not participate in activities where you could fall or become injured.  Do not drive or use machinery.  Do not drink alcohol.  Do not take sleeping pills or medicines that cause drowsiness.  Do not make important decisions or sign legal documents.  Do not take care of children on your own.   Eating and drinking  Follow any instructions from your health care provider about eating or drinking restrictions.  When you feel hungry, start by eating small amounts of foods that are soft and easy to digest (bland), such as toast. Gradually return to your regular diet.  Drink enough fluid to keep your urine pale yellow.  If you vomit, rehydrate by drinking water, juice, or clear broth. General instructions  If you have sleep apnea, surgery and certain medicines can increase your risk for breathing problems. Follow instructions from your health care provider about wearing your sleep device: ? Anytime you are sleeping, including during daytime naps. ? While taking prescription pain medicines, sleeping medicines, or medicines that make you drowsy.  Have a responsible adult stay with you for the time you are told. It is important to have  someone help care for you until you are awake and alert.  Return to your normal activities as told by your health care provider. Ask your health care provider what activities are safe for you.  Take over-the-counter and prescription medicines only as told by your health care provider.  If you smoke, do not smoke without supervision.  Keep all follow-up visits as told by your health care provider. This is important. Contact a health care provider if:  You have nausea or vomiting that does not get better with medicine.  You cannot eat or drink without vomiting.  You have pain that does not get better with medicine.  You are unable to pass urine.  You develop a skin rash.  You have a fever.  You have redness around your IV site that gets worse. Get help right away if:  You have difficulty breathing.  You have chest pain.  You have blood in your urine or stool, or you vomit blood. Summary  After the procedure, it is common to have a sore throat or nausea. It is also common to feel tired.  Have a responsible adult stay with you for the time you are told. It is important to have someone help care for you until you are awake and alert.  When you feel hungry, start by eating small amounts of foods that are soft and easy to digest (bland), such as toast. Gradually return to your regular diet.  Drink enough fluid to keep your urine pale yellow.  Return to your normal activities as told by your health care provider.   Ask your health care provider what activities are safe for you. This information is not intended to replace advice given to you by your health care provider. Make sure you discuss any questions you have with your health care provider. Document Revised: 02/16/2020 Document Reviewed: 09/15/2019 Elsevier Patient Education  2021 [place].   Scopolamine skin patches Remove in 72 hrs. Wash hands immediately after removal. What is this medicine? SCOPOLAMINE (skoe POL  a meen) is used to prevent nausea and vomiting caused by motion sickness, anesthesia and surgery. This medicine may be used for other purposes; ask your health care provider or pharmacist if you have questions. COMMON BRAND NAME(S): Transderm Scop What should I tell my health care provider before I take this medicine? They need to know if you have any of these conditions:  are scheduled to have a gastric secretion test  glaucoma  heart disease  kidney disease  liver disease  lung or breathing disease, like asthma  mental illness  prostate disease  seizures  stomach or intestine problems  trouble passing urine  an unusual or allergic reaction to scopolamine, atropine, other medicines, foods, dyes, or preservatives  pregnant or trying to get pregnant  breast-feeding How should I use this medicine? This medicine is for external use only. Follow the directions on the prescription label. Wear only 1 patch at a time. Choose an area behind the ear, that is clean, dry, hairless and free from any cuts or irritation. Wipe the area with a clean dry tissue. Peel off the plastic backing of the skin patch, trying not to touch the adhesive side with your hands. Do not cut the patches. Firmly apply to the area you have chosen, with the metallic side of the patch to the skin and the tan-colored side showing. Once firmly in place, wash your hands well with soap and water. Do not get this medicine into your eyes. After removing the patch, wash your hands and the area behind your ear thoroughly with soap and water. The patch will still contain some medicine after use. To avoid accidental contact or ingestion by children or pets, fold the used patch in half with the sticky side together and throw away in the trash out of the reach of children and pets. If you need to use a second patch after you remove the first, place it behind the other ear. A special MedGuide will be given to you by the pharmacist  with each prescription and refill. Be sure to read this information carefully each time. Talk to your pediatrician regarding the use of this medicine in children. Special care may be needed. Overdosage: If you think you have taken too much of this medicine contact a poison control center or emergency room at once. NOTE: This medicine is only for you. Do not share this medicine with others. What if I miss a dose? This does not apply. This medicine is not for regular use. What may interact with this medicine?  alcohol  antihistamines for allergy cough and cold  atropine  certain medicines for anxiety or sleep  certain medicines for bladder problems like oxybutynin, tolterodine  certain medicines for depression like amitriptyline, fluoxetine, sertraline  certain medicines for stomach problems like dicyclomine, hyoscyamine  certain medicines for Parkinson's disease like benztropine, trihexyphenidyl  certain medicines for seizures like phenobarbital, primidone  general anesthetics like halothane, isoflurane, methoxyflurane, propofol  ipratropium  local anesthetics like lidocaine, pramoxine, tetracaine  medicines that relax muscles for surgery  phenothiazines like chlorpromazine,  mesoridazine, prochlorperazine, thioridazine  narcotic medicines for pain  other belladonna alkaloids This list may not describe all possible interactions. Give your health care provider a list of all the medicines, herbs, non-prescription drugs, or dietary supplements you use. Also tell them if you smoke, drink alcohol, or use illegal drugs. Some items may interact with your medicine. What should I watch for while using this medicine? Limit contact with water while swimming and bathing because the patch may fall off. If the patch falls off, throw it away and put a new one behind the other ear. You may get drowsy or dizzy. Do not drive, use machinery, or do anything that needs mental alertness until you  know how this medicine affects you. Do not stand or sit up quickly, especially if you are an older patient. This reduces the risk of dizzy or fainting spells. Alcohol may interfere with the effect of this medicine. Avoid alcoholic drinks. Your mouth may get dry. Chewing sugarless gum or sucking hard candy, and drinking plenty of water may help. Contact your healthcare professional if the problem does not go away or is severe. This medicine may cause dry eyes and blurred vision. If you wear contact lenses, you may feel some discomfort. Lubricating drops may help. See your healthcare professional if the problem does not go away or is severe. If you are going to need surgery, an MRI, CT scan, or other procedure, tell your healthcare professional that you are using this medicine. You may need to remove the patch before the procedure. What side effects may I notice from receiving this medicine? Side effects that you should report to your doctor or health care professional as soon as possible:  allergic reactions like skin rash, itching or hives; swelling of the face, lips, or tongue  blurred vision  changes in vision  confusion  dizziness  eye pain  fast, irregular heartbeat  hallucinations, loss of contact with reality  nausea, vomiting  pain or trouble passing urine  restlessness  seizures  skin irritation  stomach pain Side effects that usually do not require medical attention (report to your doctor or health care professional if they continue or are bothersome):  drowsiness  dry mouth  headache  sore throat This list may not describe all possible side effects. Call your doctor for medical advice about side effects. You may report side effects to FDA at 1-800-FDA-1088. Where should I keep my medicine? Keep out of the reach of children. Store at room temperature between 20 and 25 degrees C (68 and 77 degrees F). Keep this medicine in the foil package until ready to use.  Throw away any unused medicine after the expiration date. NOTE: This sheet is a summary. It may not cover all possible information. If you have questions about this medicine, talk to your doctor, pharmacist, or health care provider.  2021 Elsevier/Gold Standard (2017-08-21 16:14:46)

## 2020-08-02 NOTE — Op Note (Signed)
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  This was making the anterior neck buckle and pull every time the patient swallowed in her larynx elevated.  The scar was excised from the anterior neck and the skin muscle flap was elevated superiorly and inferiorly to free up the skin and platysma.  The scarring in the supraclavicular notch area overlying the strap muscles was excised.  The skin flaps were then advanced and closed in multiple layers   Procedure: Patient was brought to the operating room and placed in supine position.  Shoulder roll was placed.  She was given general anesthesia by laryngeal mask anesthesia.  Once patient was asleep her neck scar was marked out with a marking pen for removal of the scar on the skin as it was hypertrophic.  Once the neck was marked out the anterior skin was infiltrated with 10 mL of 1% Xylocaine with epi 1: 100,000.  She was then prepped and draped in a sterile fashion.  The previous thyroidectomy scar was then excised with an ellipse of skin around the full width of the neck.  This extended for approximately 14 cm.  This was carried through skin and subcu tissue and carried through the platysma muscle as well.  A small amount of platysma was removed along with some of the underlying fatty tissue.  The platysma and skin had been scarred down directly to the midline tissues and this was all freed up.  A skin muscle flap was elevated superiorly and inferiorly  to allow for advancement of this upon closure.  Bleeding was controlled with electrocautery.  There is a lot of scar tissue that was in the suprasternal notch area and overlying the larynx and cricoid area.  Much of the scar tissue was freed up and removed.  This got down to the strap muscles they were divided in the midline.  I did not elevate underneath these 2 prevent any injury to the recurrent laryngeal nerve.  The scar tissue was trimmed overlying some of the sternocleidomastoid muscle as well being careful not to expose the muscle and leave the fascia.  This now created a cleaner anterior neck and the skin flaps were free enough to advance for closure.  Wound was irrigated copiously with saline and there was no further bleeding noted.  The wound was dry and no drain was needed.  The superior flap was advanced inferiorly and the inferior flap was brought forward and the platysma layer was closed with interrupted 4-0 Vicryl sutures along the length of the entire wound.  The fatty tissue was then trimmed slightly to help make a smooth closure.  4-0 Vicryls were then used for closure of the dermis with interrupted sutures.  The skin edges were then held in apposition with a running locking 6-0 Prolene suture.  The wound was then covered with a small amount of bacitracin followed by Telfa and a Tegaderm dressing.  The patient tolerated the procedure well.  She was awakened  and taken to the recovery room in satisfactory condition.  There were no operative complications.  Dispo:   To PACU to be discharged home  Plan: To follow-up in the office in 1 week for suture removal.  She is given Norco for pain use at home.  She will rest with her head elevated some.  She can start with a soft and liquid diet and advance as tolerated.  Claire Strong  08/02/2020 11:01 AM

## 2020-08-02 NOTE — Anesthesia Preprocedure Evaluation (Signed)
Anesthesia Evaluation  Patient identified by MRN, date of birth, ID band Patient awake    Reviewed: Allergy & Precautions, H&P , NPO status , Patient's Chart, lab work & pertinent test results, reviewed documented beta blocker date and time   History of Anesthesia Complications (+) PONV  Airway Mallampati: II  TM Distance: >3 FB Neck ROM: full    Dental no notable dental hx.    Pulmonary neg pulmonary ROS, former smoker,    Pulmonary exam normal breath sounds clear to auscultation       Cardiovascular Exercise Tolerance: Good hypertension, negative cardio ROS   Rhythm:regular Rate:Normal     Neuro/Psych negative neurological ROS  negative psych ROS   GI/Hepatic negative GI ROS, Neg liver ROS,   Endo/Other  negative endocrine ROS  Renal/GU negative Renal ROS  negative genitourinary   Musculoskeletal   Abdominal   Peds  Hematology negative hematology ROS (+)   Anesthesia Other Findings   Reproductive/Obstetrics negative OB ROS                             Anesthesia Physical Anesthesia Plan  ASA: II  Anesthesia Plan: General   Post-op Pain Management:    Induction:   PONV Risk Score and Plan: 4 or greater and Treatment may vary due to age or medical condition, Scopolamine patch - Pre-op, Dexamethasone, Ondansetron, Midazolam and Diphenhydramine  Airway Management Planned:   Additional Equipment:   Intra-op Plan:   Post-operative Plan:   Informed Consent: I have reviewed the patients History and Physical, chart, labs and discussed the procedure including the risks, benefits and alternatives for the proposed anesthesia with the patient or authorized representative who has indicated his/her understanding and acceptance.     Dental Advisory Given  Plan Discussed with: CRNA  Anesthesia Plan Comments:         Anesthesia Quick Evaluation

## 2020-08-02 NOTE — Anesthesia Postprocedure Evaluation (Signed)
Anesthesia Post Note  Patient: Claire Strong  Procedure(s) Performed: ADVANCEMENT FLAP SCAR REVISION (N/A Neck)     Patient location during evaluation: PACU Anesthesia Type: General Level of consciousness: awake and alert Pain management: pain level controlled Vital Signs Assessment: post-procedure vital signs reviewed and stable Respiratory status: spontaneous breathing, nonlabored ventilation, respiratory function stable and patient connected to nasal cannula oxygen Cardiovascular status: blood pressure returned to baseline and stable Postop Assessment: no apparent nausea or vomiting Anesthetic complications: no   No complications documented.  Aileene Lanum, Glade Stanford

## 2020-08-02 NOTE — H&P (Signed)
H&P has been reviewed and patient reevaluated, no changes necessary. To be downloaded later.  

## 2020-08-02 NOTE — Transfer of Care (Signed)
Immediate Anesthesia Transfer of Care Note  Patient: Claire Strong  Procedure(s) Performed: ADVANCEMENT FLAP SCAR REVISION (N/A Neck)  Patient Location: PACU  Anesthesia Type: General  Level of Consciousness: awake, alert  and patient cooperative  Airway and Oxygen Therapy: Patient Spontanous Breathing and Patient connected to supplemental oxygen  Post-op Assessment: Post-op Vital signs reviewed, Patient's Cardiovascular Status Stable, Respiratory Function Stable, Patent Airway and No signs of Nausea or vomiting  Post-op Vital Signs: Reviewed and stable  Complications: No complications documented.

## 2020-08-02 NOTE — Anesthesia Procedure Notes (Signed)
Procedure Name: LMA Insertion Date/Time: 08/02/2020 9:32 AM Performed by: Mayme Genta, CRNA Pre-anesthesia Checklist: Patient identified, Emergency Drugs available, Suction available, Timeout performed and Patient being monitored Patient Re-evaluated:Patient Re-evaluated prior to induction Oxygen Delivery Method: Circle system utilized Preoxygenation: Pre-oxygenation with 100% oxygen Induction Type: IV induction LMA: LMA inserted LMA Size: 4.0 Number of attempts: 1 Placement Confirmation: positive ETCO2 and breath sounds checked- equal and bilateral Tube secured with: Tape

## 2020-08-03 ENCOUNTER — Encounter: Payer: Self-pay | Admitting: Otolaryngology

## 2020-08-13 NOTE — Progress Notes (Signed)
Cardiology Office Note  Date:  08/14/2020   ID:  Claire Strong, DOB [birth year], MRN [id number]  PCP:  Virginia Crews, MD   Chief Complaint  Patient presents with  . Other    12 month f/u no complaints today. Meds reviewed verbally with pt.    HPI:  61 year-old woman with h/o  lupus anticoagulant with remote DVT,  HTN,  horseshoe kidney,  asthma  multinodular goiter s/p thyroidectomy, Obesity,   s/p roux en y gastric bypass,   post surgical hypertensive/palpitation episode associated with severe HA seen at [facility] ER, long history of palpitations, atrial tachycardia. prior Holter monitor showed atrial tachycardia.  who presents for routine evaluation of her palpitations, atrial tachycardia.  Drinking 3 cans/shakes of ensure for weight loss Tuna salad and green beans Under a 1K calories to lose weight 248 now 222, wants to get to 180  No regular exercise program   EKG personally reviewed by myself on todays visit Shows normal sinus rhythm rate 53 bpm no significant ST or T wave changes  Other past medical history reviewed Previous Holter from 2013 reviewed with her showing ectopy and short runs of atrial tachycardia  CT 08/2016 Mild atherosclerotic calcifications of the abdominal aorta.   chronic back discomfort. Out of work secondary to chronic pain  Hip surgery Nov 06 2015, Bulging disk, nonsurgical situation   History of varicose veins.   History of brain surgery on the left.   Father with CAD and CABG at 25 yo, was smoker.  PMH:   has a past medical history of Anxiety, Basal cell carcinoma, Bleeding disorder ([place]), Bronchial asthma, Cancer ([place]), Cavovarus deformity of foot, Chronic venous insufficiency (9622), Complication of anesthesia, Depression, Diverticulosis, Headache, History of DVT (deep vein thrombosis), History of gastric ulcer (2009), History of kidney stones, Horseshoe kidney, HTN (hypertension), degenerative disc disease, Hypothyroidism, Kidney  stones, Lupus anticoagulant disorder ([place]), Multinodular goiter, Nasal septal perforation, Osteoarthritis, Overweight, PONV (postoperative nausea and vomiting), Post-surgical hypothyroidism, S/P gastric bypass (05/21/2005), Seasonal allergies, Sight deterioration, Sleep apnea, and Tachycardia.  PSH:    Past Surgical History:  Procedure Laterality Date  . ABDOMINAL HERNIA REPAIR    . ABDOMINAL HYSTERECTOMY    . ANKLE FUSION Left 05/2013  . APPENDECTOMY  1978  . BACK SURGERY  2009  . CESAREAN SECTION     X 2  . CHOLECYSTECTOMY  1998  . COLONOSCOPY  05/2012   hyperplastic polyps x2, mild diverticulosis Deatra Ina) rpt 10 yrs  . COLONOSCOPY WITH PROPOFOL N/A 07/01/2018   Procedure: COLONOSCOPY WITH PROPOFOL;  Surgeon: Virgel Manifold, MD;  Location: [facility];  Service: Endoscopy;  Laterality: N/A;  . CYST EXCISION     back  and shoulder  . ESI Right 10/2015, 02/2016   C4/5 then C5/6 ESI ([place])  . ESI Bilateral 06/2015, 08/2015, 03/2016, 09/2017   L4/5 transforaminal ESI ([place])  . ESOPHAGOGASTRODUODENOSCOPY (EGD) WITH PROPOFOL N/A 07/01/2018   Procedure: ESOPHAGOGASTRODUODENOSCOPY (EGD) WITH PROPOFOL;  Surgeon: Virgel Manifold, MD;  Location: [facility];  Service: Endoscopy;  Laterality: N/A;  . FL HIP INJECTION ([place] HX) Left 11/2016   Dr Sharlet Salina  . FUSION OF TALONAVICULAR JOINT Left 09/12/2016   Procedure: FUSION OF TALONAVICULAR JOINT WITH TRINITY BONE GRAFT;  Surgeon: Samara Deist, DPM;  Location: [facility] ORS;  Service: Podiatry;  Laterality: Left;  Marland Kitchen GASTRIC BYPASS  05/21/05   Dr. Frutoso Chase, Roux-en-Y  . [place] filter removal  2015   removed 6 wks after surgery  . hammerhead  toes Left   . HERNIA REPAIR     X 3  . IVC FILTER INSERTION N/A 09/09/2016   Procedure: IVC Filter Insertion;  Surgeon: Katha Cabal, MD;  Location: [place] [facility];  Service: Cardiovascular;  Laterality: N/A;  . IVC FILTER REMOVAL N/A 11/25/2016   Procedure: IVC Filter Removal;   Surgeon: Katha Cabal, MD;  Location: [place] [facility];  Service: Cardiovascular;  Laterality: N/A;  . JOINT REPLACEMENT    . lyphoma excision  08/2015   arm  . NASAL SEPTUM SURGERY  1981   deviated septum, repaired at [facility]  . PARTIAL HYSTERECTOMY  1989   ovaries remain  . SCAR REVISION N/A 08/02/2020   Procedure: ADVANCEMENT FLAP SCAR REVISION;  Surgeon: Margaretha Sheffield, MD;  Location: [place];  Service: ENT;  Laterality: N/A;  . SKIN SURGERY     basel cell  . TONSILLECTOMY  1969  . TOTAL HIP ARTHROPLASTY Right 11/06/2015   Procedure: TOTAL HIP ARTHROPLASTY ANTERIOR APPROACH;  Surgeon: Hessie Knows, MD;  Location: [facility] ORS;  Service: Orthopedics;  Laterality: Right;  . TOTAL HIP ARTHROPLASTY Left 03/19/2017   Procedure: TOTAL HIP ARTHROPLASTY ANTERIOR APPROACH;  Surgeon: Hessie Knows, MD;  Location: [facility] OR[facility];  Service: Orthopedics;  Laterality: Left;  . TOTAL KNEE ARTHROPLASTY Left 08/2013  . TOTAL THYROIDECTOMY  09/2011   benign path (done for multinodular goiter concern for cancer)    Current Outpatient Medications  Medication Sig Dispense Refill  . acetaminophen (TYLENOL) 500 MG tablet Take 1,000 mg by mouth daily at 8 pm.    . albuterol (VENTOLIN HFA) 108 (90 Base) MCG/ACT inhaler Inhale 2 puffs into the lungs every 6 (six) hours as needed for wheezing or shortness of breath. 6.7 g 0  . Ascorbic Acid (VITAMIN C) 100 MG tablet Take by mouth daily. Unsure dose    . aspirin 81 MG tablet Take 81 mg by mouth daily.    . benazepril (LOTENSIN) 10 MG tablet Take 1 tablet by mouth once daily 90 tablet 0  . bisacodyl (DULCOLAX) 5 MG EC tablet Take 5 mg by mouth daily.    Marland Kitchen buPROPion (WELLBUTRIN SR) 150 MG 12 hr tablet Take 1 tablet by mouth twice daily 180 tablet 0  . calcium carbonate (CALCIUM 600) 600 MG TABS tablet Take 1 tablet (600 mg total) 2 (two) times daily with a meal by mouth. 60 tablet   . cetirizine (ZYRTEC) 10 MG tablet Take 10 mg by mouth daily.    .  Cholecalciferol (VITAMIN D3) 1000 UNITS tablet Take 2,000 Units by mouth daily.    . citalopram (CELEXA) 40 MG tablet Take 1 tablet by mouth once daily 90 tablet 0  . cyclobenzaprine (FLEXERIL) 10 MG tablet Take 10 mg by mouth 3 (three) times daily as needed.     Marland Kitchen EPINEPHrine (EPI-PEN) 0.3 mg/0.3 mL DEVI Inject 0.3 mLs (0.3 mg total) into the muscle once. 1 Device 2  . estradiol (ESTRACE) 2 MG tablet Take 1 tablet by mouth daily.   0  . Fluticasone-Salmeterol (ADVAIR DISKUS) 100-50 MCG/DOSE AEPB Inhale 1 puff into the lungs 2 (two) times daily. 60 each 5  . furosemide (LASIX) 20 MG tablet Takes 40 mg am and 20 mg pm qd.    . levothyroxine (SYNTHROID) 150 MCG tablet Take 150 mcg by mouth daily before breakfast.     . liothyronine (CYTOMEL) 5 MCG tablet Take 5 mcg by mouth daily.     . metoprolol tartrate (LOPRESSOR)  25 MG tablet TAKE 1 TABLET BY MOUTH TWICE DAILY. Please keep your upcoming appointment for refills. 180 tablet 0  . montelukast (SINGULAIR) 10 MG tablet Take 10 mg by mouth every morning.    . ondansetron (ZOFRAN) 4 MG tablet Take 1 tablet (4 mg total) by mouth every 8 (eight) hours as needed for nausea or vomiting. 30 tablet 0  . pantoprazole (PROTONIX) 20 MG tablet Take 1 tablet by mouth twice daily 60 tablet 2  . Potassium 95 MG TABS Take 1 tablet by mouth daily.    . solifenacin (VESICARE) 5 MG tablet Take 5 mg by mouth daily.     . SUMAtriptan (IMITREX) 50 MG tablet Take 50 mg by mouth every 2 (two) hours as needed for migraine. May repeat in 2 hours if headache persists or recurs.    . vitamin B-12 (CYANOCOBALAMIN) 500 MCG tablet Take 1 tablet (500 mcg total) by mouth daily. 90 tablet 1  . zinc gluconate 50 MG tablet Take 50 mg by mouth daily.     No current facility-administered medications for this visit.     Allergies:   Elemental sulfur, Sulfa antibiotics, Cefuroxime axetil, Nutritional supplements, Other, Oxycodone-acetaminophen, and Betadine [povidone iodine]   Social  History:  The patient  reports that she quit smoking about 16 years ago. Her smoking use included cigarettes. She smoked 0.25 packs per day. She has never used smokeless tobacco. She reports current alcohol use. She reports that she does not use drugs.   Family History:   family history includes Alcohol abuse in her father; Alzheimer's disease in her brother and mother; Brain cancer in her maternal uncle; Coronary artery disease (age of onset: 58) in her father; Diabetes in her father, maternal grandmother, mother, and paternal grandmother; Hyperlipidemia in her mother; Lung cancer in her brother and father.    Review of Systems: Review of Systems  Constitutional: Negative.   HENT: Negative.   Respiratory: Negative.   Cardiovascular: Negative.   Gastrointestinal: Negative.   Musculoskeletal: Negative.   Neurological: Negative.   Psychiatric/Behavioral: Negative.   All other systems reviewed and are negative.   PHYSICAL EXAM: VS:  BP 110/70 (BP Location: Left Arm, Patient Position: Sitting, Cuff Size: Large)   Pulse (!) 53   Ht 5\' 7"  (1.702 m)   Wt 228 lb (103.4 kg)   SpO2 98%   BMI 35.71 kg/m  , BMI Body mass index is 35.71 kg/m. Constitutional:  oriented to person, place, and time. No distress.  HENT:  Head: Grossly normal Eyes:  no discharge. No scleral icterus.  Neck: No JVD, no carotid bruits  Cardiovascular: Regular rate and rhythm, no murmurs appreciated Pulmonary/Chest: Clear to auscultation bilaterally, no wheezes or rails Abdominal: Soft.  no distension.  no tenderness.  Musculoskeletal: Normal range of motion Neurological:  normal muscle tone. Coordination normal. No atrophy Skin: Skin warm and dry Psychiatric: normal affect, pleasant   Recent Labs: 04/26/2020: TSH 0.882 06/18/2020: ALT 21; Hemoglobin 12.3; Platelets 197 07/17/2020: BUN 22; Creatinine, Ser 0.95; Potassium 3.2; Sodium 135    Lipid Panel Lab Results  Component Value Date   CHOL 168 01/23/2020    HDL 52 01/23/2020   LDLCALC 88 01/23/2020   TRIG 166 (H) 01/23/2020      Wt Readings from Last 3 Encounters:  08/14/20 228 lb (103.4 kg)  08/02/20 216 lb (98 kg)  06/19/20 232 lb (105.2 kg)      ASSESSMENT AND PLAN:   Atrial tachycardia ([place]) - Plan: EKG  12-Lead On metoprolol BID. Asymptomatic bradycardia Rhythm well controlled  Essential hypertension Blood pressure is well controlled on today's visit. No changes made to the medications.  Localized edema Venous insuff, recommended compression hose  chest pain/headache No further symptoms of chest pain CT scan with mild aorta plaque  Leg edema onlasix 40/20, Will increase potassium up to 3 a day Recent potassium 3.2 one month ago   Total encounter time more than 25 minutes  Greater than 50% was spent in counseling and coordination of care with the patient   Disposition:   F/U  12 months    Orders Placed This Encounter  Procedures  . EKG 12-Lead     Signed, Esmond Plants, M.D., Ph.D. 08/14/2020  [place], [place]

## 2020-08-14 ENCOUNTER — Other Ambulatory Visit: Payer: Self-pay

## 2020-08-14 ENCOUNTER — Encounter: Payer: Self-pay | Admitting: Cardiovascular Disease

## 2020-08-14 ENCOUNTER — Ambulatory Visit: Payer: Medicare Other | Admitting: Cardiovascular Disease

## 2020-08-14 VITALS — BP 110/70 | HR 53 | Ht 67.0 in | Wt 228.0 lb

## 2020-08-14 DIAGNOSIS — R002 Palpitations: Secondary | ICD-10-CM

## 2020-08-14 DIAGNOSIS — I700 Atherosclerosis of aorta: Secondary | ICD-10-CM | POA: Diagnosis not present

## 2020-08-14 DIAGNOSIS — I471 Supraventricular tachycardia: Secondary | ICD-10-CM | POA: Diagnosis not present

## 2020-08-14 DIAGNOSIS — I10 Essential (primary) hypertension: Secondary | ICD-10-CM

## 2020-08-14 MED ORDER — METOPROLOL TARTRATE 25 MG PO TABS: ORAL_TABLET | ORAL | 3 refills | Status: DC

## 2020-08-14 MED ORDER — BENAZEPRIL HCL 10 MG PO TABS: 10.0000 mg | ORAL_TABLET | Freq: Every day | ORAL | 3 refills | Status: DC

## 2020-08-14 NOTE — Patient Instructions (Signed)
Medication Instructions:  No changes  If you need a refill on your cardiac medications before your next appointment, please call your pharmacy.    Lab work: No new labs needed   If you have labs (blood work) drawn today and your tests are completely normal, you will receive your results only by: . MyChart Message (if you have MyChart) OR . A paper copy in the mail If you have any lab test that is abnormal or we need to change your treatment, we will call you to review the results.   Testing/Procedures: No new testing needed   Follow-Up: At [facility], you and your health needs are our priority.  As part of our continuing mission to provide you with exceptional heart care, we have created designated Provider Care Teams.  These Care Teams include your primary Cardiologist (physician) and Advanced Practice Providers (APPs -  Physician Assistants and Nurse Practitioners) who all work together to provide you with the care you need, when you need it.  . You will need a follow up appointment in 12 months  . Providers on your designated Care Team:   . Christopher Berge, NP . Ryan Dunn, PA-C . Jacquelyn Visser, PA-C  Any Other Special Instructions Will Be Listed Below (If Applicable).  COVID-19 Vaccine Information can be found at: [email or web address] For questions related to vaccine distribution or appointments, please email [email or web address] or call [phone].     

## 2020-08-25 ENCOUNTER — Other Ambulatory Visit: Payer: Self-pay | Admitting: Family Medicine

## 2020-08-25 NOTE — Telephone Encounter (Signed)
Requested Prescriptions  Pending Prescriptions Disp Refills  . buPROPion (WELLBUTRIN SR) 150 MG 12 hr tablet [Pharmacy Med Name: buPROPion HCl ER (SR) 150 MG Oral Tablet Extended Release 12 Hour] 180 tablet 0    Sig: Take 1 tablet by mouth twice daily     Psychiatry: Antidepressants - bupropion Passed - 08/25/2020 10:43 AM      Passed - Completed PHQ-2 or PHQ-9 in the last 360 days      Passed - Last BP in normal range    BP Readings from Last 1 Encounters:  08/14/20 110/70         Passed - Valid encounter within last 6 months    Recent Outpatient Visits          3 months ago Need for influenza vaccination   [facility] [place], Dionne Bucy, MD   7 months ago Mild intermittent asthma with acute exacerbation   [facility] [place] Carles Collet M, PA-C   9 months ago Mild intermittent asthma with acute exacerbation   [facility], Dionne Bucy, MD   1 year ago Essential hypertension   [facility] [place], Dionne Bucy, MD   1 year ago Cough   [place], Dionne Bucy, MD      Future Appointments            In 2 days Bacigalupo, Dionne Bucy, MD [facility]           . citalopram ([place]) 40 MG tablet [Pharmacy Med Name: Citalopram Hydrobromide 40 MG Oral Tablet] 90 tablet 0    Sig: Take 1 tablet by mouth once daily     Psychiatry:  Antidepressants - SSRI Passed - 08/25/2020 10:43 AM      Passed - Completed PHQ-2 or PHQ-9 in the last 360 days      Passed - Valid encounter within last 6 months    Recent Outpatient Visits          3 months ago Need for influenza vaccination   [facility] [place], Dionne Bucy, MD   7 months ago Mild intermittent asthma with acute exacerbation   [facility] Carles Collet M, PA-C   9 months ago Mild intermittent asthma with acute exacerbation   [facility] [place], Dionne Bucy, MD   1 year ago  Essential hypertension   [place], Dionne Bucy, MD   1 year ago Cough   [facility] [place], Dionne Bucy, MD      Future Appointments            In 2 days Bacigalupo, Dionne Bucy, MD [facility] [place]

## 2020-08-27 ENCOUNTER — Ambulatory Visit: Payer: Medicare Other | Admitting: Family Medicine

## 2020-08-30 DIAGNOSIS — D2372 Other benign neoplasm of skin of left lower limb, including hip: Secondary | ICD-10-CM | POA: Diagnosis not present

## 2020-08-30 DIAGNOSIS — M2042 Other hammer toe(s) (acquired), left foot: Secondary | ICD-10-CM | POA: Diagnosis not present

## 2020-08-30 DIAGNOSIS — M79672 Pain in left foot: Secondary | ICD-10-CM | POA: Diagnosis not present

## 2020-09-06 ENCOUNTER — Other Ambulatory Visit: Payer: Self-pay

## 2020-09-06 ENCOUNTER — Encounter: Payer: Self-pay | Admitting: Family Medicine

## 2020-09-06 ENCOUNTER — Ambulatory Visit (INDEPENDENT_AMBULATORY_CARE_PROVIDER_SITE_OTHER): Payer: Medicare Other | Admitting: Family Medicine

## 2020-09-06 VITALS — BP 99/76 | HR 85 | Temp 98.1°F | Resp 16 | Ht 67.0 in | Wt 220.7 lb

## 2020-09-06 DIAGNOSIS — F331 Major depressive disorder, recurrent, moderate: Secondary | ICD-10-CM | POA: Diagnosis not present

## 2020-09-06 DIAGNOSIS — E876 Hypokalemia: Secondary | ICD-10-CM | POA: Diagnosis not present

## 2020-09-06 DIAGNOSIS — Z6834 Body mass index (BMI) 34.0-34.9, adult: Secondary | ICD-10-CM

## 2020-09-06 DIAGNOSIS — I471 Supraventricular tachycardia: Secondary | ICD-10-CM

## 2020-09-06 DIAGNOSIS — E669 Obesity, unspecified: Secondary | ICD-10-CM

## 2020-09-06 DIAGNOSIS — I10 Essential (primary) hypertension: Secondary | ICD-10-CM | POA: Diagnosis not present

## 2020-09-06 DIAGNOSIS — D6862 Lupus anticoagulant syndrome: Secondary | ICD-10-CM

## 2020-09-06 DIAGNOSIS — E890 Postprocedural hypothyroidism: Secondary | ICD-10-CM

## 2020-09-06 DIAGNOSIS — E211 Secondary hyperparathyroidism, not elsewhere classified: Secondary | ICD-10-CM

## 2020-09-06 MED ORDER — FUROSEMIDE 40 MG PO TABS: 40.0000 mg | ORAL_TABLET | Freq: Every day | ORAL | 1 refills | Status: DC

## 2020-09-06 NOTE — Progress Notes (Signed)
Established patient visit   Patient: Claire Strong   DOB: [birth year]   60 y.o. Female  MRN: [id number] Visit Date: 09/06/2020  Today's healthcare provider: Lavon Paganini, MD   Chief Complaint  Patient presents with  . Hypertension  . Hypothyroidism  . Depression   Subjective    HPI  Hypertension, follow-up  BP Readings from Last 3 Encounters:  09/06/20 99/76  08/14/20 110/70  08/02/20 120/78   Wt Readings from Last 3 Encounters:  09/06/20 220 lb 11.2 oz (100.1 kg)  08/14/20 228 lb (103.4 kg)  08/02/20 216 lb (98 kg)     She was last seen for hypertension 6 months ago.  BP at that visit was 105/70. Management since that visit includes no changes.  She reports excellent compliance with treatment. She is not having side effects.  She is following a Regular diet. She is not exercising. She does not smoke.  Use of agents associated with hypertension: none.   Outside blood pressures are not being checked. Symptoms: No chest pain No chest pressure  No palpitations No syncope  No dyspnea No orthopnea  No paroxysmal nocturnal dyspnea No lower extremity edema   Pertinent labs: Lab Results  Component Value Date   CHOL 168 01/23/2020   HDL 52 01/23/2020   LDLCALC 88 01/23/2020   TRIG 166 (H) 01/23/2020   CHOLHDL 3.2 01/23/2020   Lab Results  Component Value Date   NA 135 09/06/2020   K 3.2 (L) 09/06/2020   CREATININE 1.10 (H) 09/06/2020   GFRNONAA >60 07/17/2020   GFRAA 72 01/23/2020   GLUCOSE 84 09/06/2020     The 10-year ASCVD risk score (Goff DC Jr., et al., 2013) is: 2.2%   --------------------------------------------------------------------------------------------------- Hypothyroid, follow-up  Lab Results  Component Value Date   TSH 0.882 04/26/2020   TSH 0.321 (L) 11/10/2019   TSH 0.663 04/29/2019   FREET4 0.86 04/26/2020   FREET4 0.88 11/10/2019   Wt Readings from Last 3 Encounters:  09/06/20 220 lb 11.2 oz (100.1 kg)  08/14/20  228 lb (103.4 kg)  08/02/20 216 lb (98 kg)    She was last seen for hypothyroid 6 months ago.  Management since that visit includes no changes. She reports excellent compliance with treatment. She is not having side effects.   Symptoms: No change in energy level No constipation  No diarrhea No heat / cold intolerance  No nervousness No palpitations  No weight changes    ----------------------------------------------------------------------------------------- Depression, Follow-up  She  was last seen for this 6 months ago. Changes made at last visit include no changes.   She reports excellent compliance with treatment. She is not having side effects.   She reports excellent tolerance of treatment. Current symptoms include: depressed mood, difficulty concentrating, fatigue, feelings of worthlessness/guilt, hopelessness and insomnia She feels she is Improved since last visit.  Depression screen [facility] 2/9 09/06/2020 01/23/2020 01/20/2020  Decreased Interest 1 2 1   Down, Depressed, Hopeless 1 0 1  PHQ - 2 Score 2 2 2   Altered sleeping 1 1 1   Tired, decreased energy 3 0 0  Change in appetite 0 1 3  Feeling bad or failure about yourself  1 1 1   Trouble concentrating 1 0 2  Moving slowly or fidgety/restless 1 0 1  Suicidal thoughts 0 0 0  PHQ-9 Score 9 5 10   Difficult doing work/chores Somewhat difficult Somewhat difficult Not difficult at all  Some recent data might be hidden    -----------------------------------------------------------------------------------------  Patient Active Problem List   Diagnosis Date Noted  . Moderate episode of recurrent major depressive disorder ([place]) 07/22/2019  . GAD (generalized anxiety disorder) 07/22/2019  . Class 1 obesity with serious comorbidity and body mass index (BMI) of 34.0 to 34.9 in adult 01/19/2019  . Eosinophilic esophagitis   . Benign neoplasm of ascending colon   . Benign neoplasm of cecum   . Diverticulosis of large intestine  without diverticulitis   . History of gastric bypass 06/01/2018  . Esophageal dysphagia 05/06/2018  . Secondary hyperparathyroidism, non-renal ([place]) 05/02/2017  . CKD (chronic kidney disease) stage 2, GFR 60-89 ml/min 05/02/2017  . Primary localized osteoarthritis of left hip 03/19/2017  . History of DVT (deep vein thrombosis) 08/25/2016  . PAD (peripheral artery disease) ([place]) 08/25/2016  . Chronic venous insufficiency 08/25/2016  . Iron deficiency anemia 03/25/2016  . Primary osteoarthritis of right hip 11/06/2015  . History of kidney stones 09/03/2015  . Osteoarthritis   . Fibromyalgia 02/14/2014  . Atrial tachycardia ([place]) 01/25/2013  . Hypothyroidism, postsurgical 02/25/2012  . Edema 01/28/2012  . Headache(784.0) 09/25/2011  . Bronchial asthma   . Hot flashes 03/18/2011  . Nasal septal perforation   . Horseshoe kidney   . HTN (hypertension)   . Lupus anticoagulant disorder ([place])   . 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   Social History   Socioeconomic History  . Marital status: Married    Spouse name: Gwyndolyn Saxon  . Number of children: 2  . Years of education: 73  . Highest education level: High school graduate  Occupational History  . Occupation: stay at home  Tobacco Use  . Smoking status: Former Smoker    Packs/day: 0.25    Types: Cigarettes    Quit date: 06/16/2004    Years since quitting: 16.2  . Smokeless tobacco: Never Used  . Tobacco comment: Socially-no longer  Vaping Use  . Vaping Use: Never used  Substance and Sexual Activity  . Alcohol use: Yes    Alcohol/week: 0.0 standard drinks    Comment: Occasionally-beer or bloody mary monthly  . Drug use: No  . Sexual activity: Yes    Partners: Male    Birth control/protection: Surgical  Other Topics Concern  . Not on file  Social History Narrative  . Not on file   Social Determinants of Health   Financial Resource Strain: Low Risk   . Difficulty of Paying Living Expenses: Not hard at all  Food Insecurity: No  Food Insecurity  . Worried About Charity fundraiser in the Last Year: Never true  . Ran Out of Food in the Last Year: Never true  Transportation Needs: No Transportation Needs  . Lack of Transportation (Medical): No  . Lack of Transportation (Non-Medical): No  Physical Activity: Inactive  . Days of Exercise per Week: 0 days  . Minutes of Exercise per Session: 0 min  Stress: Stress Concern Present  . Feeling of Stress : To some extent  Social Connections: Moderately Isolated  . Frequency of Communication with Friends and Family: More than three times a week  . Frequency of Social Gatherings with Friends and Family: More than three times a week  . Attends Religious Services: Never  . Active Member of Clubs or Organizations: No  . Attends Archivist Meetings: Never  . Marital Status: Married  Human resources officer Violence: Not At Risk  . Fear of Current or Ex-Partner: No  . Emotionally Abused: No  . Physically Abused: No  . Sexually Abused:  No   Allergies  Allergen Reactions  . Elemental Sulfur Anaphylaxis  . Sulfa Antibiotics Shortness Of Breath  . Cefuroxime Axetil Other (See Comments)    Bleeding ulcer  . Nutritional Supplements Swelling    Unsure which  . Other     All narcotics   . Oxycodone-Acetaminophen Nausea And Vomiting  . Betadine [Povidone Iodine] Rash       Medications: Outpatient Medications Prior to Visit  Medication Sig  . acetaminophen (TYLENOL) 500 MG tablet Take 1,000 mg by mouth daily at 8 pm.  . albuterol (VENTOLIN HFA) 108 (90 Base) MCG/ACT inhaler Inhale 2 puffs into the lungs every 6 (six) hours as needed for wheezing or shortness of breath.  . Ascorbic Acid (VITAMIN C) 100 MG tablet Take by mouth daily. Unsure dose  . aspirin 81 MG tablet Take 81 mg by mouth daily.  . benazepril (LOTENSIN) 10 MG tablet Take 1 tablet (10 mg total) by mouth daily.  . bisacodyl (DULCOLAX) 5 MG EC tablet Take 5 mg by mouth daily.  Marland Kitchen buPROPion (WELLBUTRIN SR)  150 MG 12 hr tablet Take 1 tablet by mouth twice daily  . calcium carbonate (CALCIUM 600) 600 MG TABS tablet Take 1 tablet (600 mg total) 2 (two) times daily with a meal by mouth.  . cetirizine (ZYRTEC) 10 MG tablet Take 10 mg by mouth daily.  . Cholecalciferol (VITAMIN D3) 1000 UNITS tablet Take 2,000 Units by mouth daily.  . citalopram (CELEXA) 40 MG tablet Take 1 tablet by mouth once daily  . cyclobenzaprine (FLEXERIL) 10 MG tablet Take 10 mg by mouth 3 (three) times daily as needed.   Marland Kitchen EPINEPHrine (EPI-PEN) 0.3 mg/0.3 mL DEVI Inject 0.3 mLs (0.3 mg total) into the muscle once.  Marland Kitchen estradiol (ESTRACE) 2 MG tablet Take 1 tablet by mouth daily.   . Fluticasone-Salmeterol (ADVAIR DISKUS) 100-50 MCG/DOSE AEPB Inhale 1 puff into the lungs 2 (two) times daily.  Marland Kitchen levothyroxine (SYNTHROID) 150 MCG tablet Take 150 mcg by mouth daily before breakfast.   . liothyronine (CYTOMEL) 5 MCG tablet Take 5 mcg by mouth daily.   . metoprolol tartrate (LOPRESSOR) 25 MG tablet TAKE 1 TABLET BY MOUTH TWICE DAILY.  . montelukast (SINGULAIR) 10 MG tablet Take 10 mg by mouth every morning.  . ondansetron (ZOFRAN) 4 MG tablet Take 1 tablet (4 mg total) by mouth every 8 (eight) hours as needed for nausea or vomiting.  . pantoprazole (PROTONIX) 20 MG tablet Take 1 tablet by mouth twice daily  . Potassium 95 MG TABS Take 1 tablet by mouth daily.  . solifenacin (VESICARE) 5 MG tablet Take 5 mg by mouth daily.   . SUMAtriptan (IMITREX) 50 MG tablet Take 50 mg by mouth every 2 (two) hours as needed for migraine. May repeat in 2 hours if headache persists or recurs.  . vitamin B-12 (CYANOCOBALAMIN) 500 MCG tablet Take 1 tablet (500 mcg total) by mouth daily.  Marland Kitchen zinc gluconate 50 MG tablet Take 50 mg by mouth daily.  . [DISCONTINUED] furosemide (LASIX) 20 MG tablet Takes 40 mg am and 20 mg pm qd.   No facility-administered medications prior to visit.    Review of Systems - per HPI  Last CBC Lab Results  Component  Value Date   WBC 5.0 06/18/2020   HGB 12.3 06/18/2020   HCT 35.8 (L) 06/18/2020   MCV 89.5 06/18/2020   MCH 30.8 06/18/2020   RDW 12.2 06/18/2020   PLT 197 76/72/0947   Last metabolic  panel Lab Results  Component Value Date   GLUCOSE 84 09/06/2020   NA 135 09/06/2020   K 3.2 (L) 09/06/2020   CL 96 09/06/2020   CO2 21 09/06/2020   BUN 19 09/06/2020   CREATININE 1.10 (H) 09/06/2020   GFRNONAA >60 07/17/2020   GFRAA 72 01/23/2020   CALCIUM 8.8 09/06/2020   PHOS 3.3 05/06/2018   PROT 6.0 (L) 06/18/2020   ALBUMIN 3.5 06/18/2020   LABGLOB 2.0 01/23/2020   AGRATIO 1.9 01/23/2020   BILITOT 0.5 06/18/2020   ALKPHOS 111 06/18/2020   AST 24 06/18/2020   ALT 21 06/18/2020   ANIONGAP 9 07/17/2020   Last lipids Lab Results  Component Value Date   CHOL 168 01/23/2020   HDL 52 01/23/2020   LDLCALC 88 01/23/2020   TRIG 166 (H) 01/23/2020   CHOLHDL 3.2 01/23/2020        Objective    BP 99/76 (BP Location: Left Arm, Patient Position: Sitting, Cuff Size: Large)   Pulse 85   Temp 98.1 F (36.7 C) (Temporal)   Resp 16   Ht $R'5\' 7"'pN$  (1.702 m)   Wt 220 lb 11.2 oz (100.1 kg)   SpO2 99%   BMI 34.57 kg/m  BP Readings from Last 3 Encounters:  09/06/20 99/76  08/14/20 110/70  08/02/20 120/78   Wt Readings from Last 3 Encounters:  09/06/20 220 lb 11.2 oz (100.1 kg)  08/14/20 228 lb (103.4 kg)  08/02/20 216 lb (98 kg)      Physical Exam Vitals reviewed.  Constitutional:      General: She is not in acute distress.    Appearance: Normal appearance. She is well-developed. She is not diaphoretic.  HENT:     Head: Normocephalic and atraumatic.  Eyes:     General: No scleral icterus.    Conjunctiva/sclera: Conjunctivae normal.  Neck:     Thyroid: No thyromegaly.  Cardiovascular:     Rate and Rhythm: Normal rate and regular rhythm.     Pulses: Normal pulses.     Heart sounds: Normal heart sounds. No murmur heard.   Pulmonary:     Effort: Pulmonary effort is normal. No  respiratory distress.     Breath sounds: Normal breath sounds. No wheezing, rhonchi or rales.  Musculoskeletal:     Cervical back: Neck supple.     Right lower leg: No edema.     Left lower leg: No edema.  Lymphadenopathy:     Cervical: No cervical adenopathy.  Skin:    General: Skin is warm and dry.     Findings: No rash.  Neurological:     Mental Status: She is alert and oriented to person, place, and time. Mental status is at baseline.  Psychiatric:        Mood and Affect: Mood normal.        Behavior: Behavior normal.       Results for orders placed or performed in visit on 08/28/92  Basic Metabolic Panel (BMET)  Result Value Ref Range   Glucose 84 65 - 99 mg/dL   BUN 19 6 - 24 mg/dL   Creatinine, Ser 1.10 (H) 0.57 - 1.00 mg/dL   eGFR 58 (L) >59 mL/min/1.73   BUN/Creatinine Ratio 17 9 - 23   Sodium 135 134 - 144 mmol/L   Potassium 3.2 (L) 3.5 - 5.2 mmol/L   Chloride 96 96 - 106 mmol/L   CO2 21 20 - 29 mmol/L   Calcium 8.8 8.7 - 10.2 mg/dL  Assessment & Plan     Problem List Items Addressed This Visit      Cardiovascular and Mediastinum   HTN (hypertension)    Well-controlled, but slightly low blood pressure today Patient is asymptomatic and would prefer not to change her medications She will continue to monitor her blood pressure at home as well Refilled Lasix Recheck metabolic panel Also followed by cardiology We will see annually      Relevant Medications   furosemide (LASIX) 40 MG tablet   Other Relevant Orders   Basic Metabolic Panel (BMET) (Completed)   Atrial tachycardia ([facility]) - Primary    Well-controlled Continue metoprolol Followed by cardiology      Relevant Medications   furosemide (LASIX) 40 MG tablet     Endocrine   Hypothyroidism, postsurgical    Followed by ENT, who is checking her TSH and adjusting her Synthroid      Secondary hyperparathyroidism, non-renal ([place])    Followed by ENT      Relevant Orders   Basic Metabolic  Panel (BMET) (Completed)     Hematopoietic and Hemostatic   Lupus anticoagulant disorder ([place])    Followed by hematology Reviewed recent labs        Other   Class 1 obesity with serious comorbidity and body mass index (BMI) of 34.0 to 34.9 in adult    Congratulated on weight loss Discussed importance of healthy weight management Discussed diet and exercise       Moderate episode of recurrent major depressive disorder ([place])    Chronic and stable Fairly well controlled Continue current medications Encourage therapy       Other Visit Diagnoses    Hypokalemia       Relevant Orders   Basic Metabolic Panel (BMET) (Completed)       Return in about 6 months (around 03/09/2021) for CPE.      I, Lavon Paganini, MD, have reviewed all documentation for this visit. The documentation on 09/07/20 for the exam, diagnosis, procedures, and orders are all accurate and complete.   , Dionne Bucy, MD, MPH [facility]

## 2020-09-07 ENCOUNTER — Encounter: Payer: Self-pay | Admitting: Family Medicine

## 2020-09-07 LAB — BASIC METABOLIC PANEL
BUN/Creatinine Ratio: 17 (ref 9–23)
BUN: 19 mg/dL (ref 6–24)
CO2: 21 mmol/L (ref 20–29)
Calcium: 8.8 mg/dL (ref 8.7–10.2)
Chloride: 96 mmol/L (ref 96–106)
Creatinine, Ser: 1.1 mg/dL — ABNORMAL HIGH (ref 0.57–1.00)
Glucose: 84 mg/dL (ref 65–99)
Potassium: 3.2 mmol/L — ABNORMAL LOW (ref 3.5–5.2)
Sodium: 135 mmol/L (ref 134–144)
eGFR: 58 mL/min/{1.73_m2} — ABNORMAL LOW (ref >59)

## 2020-09-07 NOTE — Assessment & Plan Note (Signed)
Chronic and stable Fairly well controlled Continue current medications Encourage therapy

## 2020-09-07 NOTE — Assessment & Plan Note (Signed)
Well-controlled Continue metoprolol Followed by cardiology

## 2020-09-07 NOTE — Assessment & Plan Note (Signed)
Followed by hematology Reviewed recent labs

## 2020-09-07 NOTE — Assessment & Plan Note (Signed)
Congratulated on weight loss ?Discussed importance of healthy weight management ?Discussed diet and exercise  ?

## 2020-09-07 NOTE — Assessment & Plan Note (Signed)
Followed by ENT

## 2020-09-07 NOTE — Assessment & Plan Note (Signed)
Well-controlled, but slightly low blood pressure today Patient is asymptomatic and would prefer not to change her medications She will continue to monitor her blood pressure at home as well Refilled Lasix Recheck metabolic panel Also followed by cardiology We will see annually

## 2020-09-07 NOTE — Assessment & Plan Note (Signed)
Followed by ENT, who is checking her TSH and adjusting her Synthroid

## 2020-09-20 DIAGNOSIS — M79672 Pain in left foot: Secondary | ICD-10-CM | POA: Diagnosis not present

## 2020-09-20 DIAGNOSIS — M2042 Other hammer toe(s) (acquired), left foot: Secondary | ICD-10-CM | POA: Diagnosis not present

## 2020-09-25 ENCOUNTER — Ambulatory Visit: Payer: Self-pay | Admitting: *Deleted

## 2020-09-25 DIAGNOSIS — Z1231 Encounter for screening mammogram for malignant neoplasm of breast: Secondary | ICD-10-CM | POA: Diagnosis not present

## 2020-09-25 NOTE — Telephone Encounter (Signed)
Patient is calling to report she has fallen 5 times in 7 days- patient reports she has fallen over when she bend down to pick something up. Patient states she does not feel dizzy. Patient does have some degenerative changes in her back- but reports no pain or change that she can tell. Patient states she has had inner ear balance problem in the past- but this does not feel like that. Patient is puzzled as to why she keeps falling. So far she has only gotten scrapes and bruises- but she does not want to get a more severe injury. Appointment scheduled for evaluation- patient does have upcoming surgery scheduled on her foot and would like to be seen as soon as possible. Patient is scheduled with another provider in office- her provider out of office at this time. Call sent for review.  Reason for Disposition . [1] Fall AND [2] patient seems very anxious and fearful of falling again  Answer Assessment - Initial Assessment Questions 1. MECHANISM: "How did the fall happen?"     Stand to bend- losing balance 2. DOMESTIC VIOLENCE AND ELDER ABUSE SCREENING: "Did you fall because someone pushed you or tried to hurt you?" If Yes, ask: "Are you safe now?"     no 3. ONSET: "When did the fall happen?" (e.g., minutes, hours, or days ago)     5 falls in the last 7 days 4. LOCATION: "What part of the body hit the ground?" (e.g., back, buttocks, head, hips, knees, hands, head, stomach)     Patient is bruised- arms, stomach, legs 5. INJURY: "Did you hurt (injure) yourself when you fell?" If Yes, ask: "What did you injure? Tell me more about this?" (e.g., body area; type of injury; pain severity)"     No injury- just brusing 6. PAIN: "Is there any pain?" If Yes, ask: "How bad is the pain?" (e.g., Scale 1-10; or mild,  moderate, severe)   - NONE (0): no pain   - MILD (1-3): doesn't interfere with normal activities    - MODERATE (4-7): interferes with normal activities or awakens from sleep    - SEVERE (8-10):  excruciating pain, unable to do any normal activities      No pain- some back pain- degenerative disease 7. SIZE: For cuts, bruises, or swelling, ask: "How large is it?" (e.g., inches or centimeters)      Some scrapes from concrete 8. PREGNANCY: "Is there any chance you are pregnant?" "When was your last menstrual period?"     n/a 9. OTHER SYMPTOMS: "Do you have any other symptoms?" (e.g., dizziness, fever, weakness; new onset or worsening).      No other symptoms 10. CAUSE: "What do you think caused the fall (or falling)?" (e.g., tripped, dizzy spell)       - questions if she has more degenerative changes.  Protocols used: FALLS AND FALLING-A-AH

## 2020-09-27 ENCOUNTER — Other Ambulatory Visit: Payer: Self-pay

## 2020-09-27 ENCOUNTER — Encounter: Payer: Self-pay | Admitting: Podiatry

## 2020-10-01 ENCOUNTER — Other Ambulatory Visit: Admission: RE | Admit: 2020-10-01 | Payer: Medicare Other | Source: Ambulatory Visit

## 2020-10-01 ENCOUNTER — Other Ambulatory Visit
Admission: RE | Admit: 2020-10-01 | Discharge: 2020-10-01 | Disposition: A | Discharge status: Home / Self Care | Payer: Medicare Other | Source: Ambulatory Visit | Attending: Podiatry | Admitting: Podiatry

## 2020-10-01 ENCOUNTER — Other Ambulatory Visit: Payer: Self-pay

## 2020-10-01 DIAGNOSIS — Z20822 Contact with and (suspected) exposure to covid-19: Secondary | ICD-10-CM | POA: Diagnosis not present

## 2020-10-01 DIAGNOSIS — Z01812 Encounter for preprocedural laboratory examination: Secondary | ICD-10-CM | POA: Insufficient documentation

## 2020-10-01 LAB — SARS CORONAVIRUS 2 (TAT 6-24 HRS): SARS Coronavirus 2: NEGATIVE

## 2020-10-02 ENCOUNTER — Ambulatory Visit: Payer: Self-pay | Admitting: Family Medicine

## 2020-10-03 ENCOUNTER — Encounter
Admission: RE | Disposition: A | Discharge status: Home / Self Care | Payer: Self-pay | Source: Home / Self Care | Attending: Podiatry

## 2020-10-03 ENCOUNTER — Other Ambulatory Visit: Payer: Self-pay

## 2020-10-03 ENCOUNTER — Ambulatory Visit: Payer: Medicare Other | Admitting: Anesthesiology

## 2020-10-03 ENCOUNTER — Ambulatory Visit
Admission: RE | Admit: 2020-10-03 | Discharge: 2020-10-03 | Disposition: A | Discharge status: Home / Self Care | Payer: Medicare Other | Attending: Podiatry | Admitting: Podiatry

## 2020-10-03 ENCOUNTER — Encounter: Payer: Self-pay | Admitting: Podiatry

## 2020-10-03 DIAGNOSIS — Z882 Allergy status to sulfonamides status: Secondary | ICD-10-CM | POA: Insufficient documentation

## 2020-10-03 DIAGNOSIS — M24575 Contracture, left foot: Secondary | ICD-10-CM | POA: Diagnosis not present

## 2020-10-03 DIAGNOSIS — Z7951 Long term (current) use of inhaled steroids: Secondary | ICD-10-CM | POA: Insufficient documentation

## 2020-10-03 DIAGNOSIS — Z9884 Bariatric surgery status: Secondary | ICD-10-CM | POA: Diagnosis not present

## 2020-10-03 DIAGNOSIS — Z96652 Presence of left artificial knee joint: Secondary | ICD-10-CM | POA: Diagnosis not present

## 2020-10-03 DIAGNOSIS — Z79899 Other long term (current) drug therapy: Secondary | ICD-10-CM | POA: Insufficient documentation

## 2020-10-03 DIAGNOSIS — Z87891 Personal history of nicotine dependence: Secondary | ICD-10-CM | POA: Diagnosis not present

## 2020-10-03 DIAGNOSIS — Z881 Allergy status to other antibiotic agents status: Secondary | ICD-10-CM | POA: Insufficient documentation

## 2020-10-03 DIAGNOSIS — Z888 Allergy status to other drugs, medicaments and biological substances status: Secondary | ICD-10-CM | POA: Diagnosis not present

## 2020-10-03 DIAGNOSIS — Z79890 Hormone replacement therapy (postmenopausal): Secondary | ICD-10-CM | POA: Insufficient documentation

## 2020-10-03 DIAGNOSIS — M2042 Other hammer toe(s) (acquired), left foot: Secondary | ICD-10-CM | POA: Diagnosis not present

## 2020-10-03 DIAGNOSIS — Z7982 Long term (current) use of aspirin: Secondary | ICD-10-CM | POA: Diagnosis not present

## 2020-10-03 DIAGNOSIS — M205X2 Other deformities of toe(s) (acquired), left foot: Secondary | ICD-10-CM | POA: Diagnosis not present

## 2020-10-03 DIAGNOSIS — Z885 Allergy status to narcotic agent status: Secondary | ICD-10-CM | POA: Insufficient documentation

## 2020-10-03 DIAGNOSIS — Z96641 Presence of right artificial hip joint: Secondary | ICD-10-CM | POA: Insufficient documentation

## 2020-10-03 HISTORY — PX: WEIL OSTEOTOMY: SHX5044

## 2020-10-03 HISTORY — PX: HAMMER TOE SURGERY: SHX385

## 2020-10-03 SURGERY — OSTEOTOMY, WEIL
Anesthesia: General | Site: Toe | Laterality: Left

## 2020-10-03 MED ORDER — PHENYLEPHRINE HCL (PRESSORS) 10 MG/ML IV SOLN
INTRAVENOUS | Status: DC | PRN
  Administered 2020-10-03: 50 ug via INTRAVENOUS
  Administered 2020-10-03 (×5): 100 ug via INTRAVENOUS

## 2020-10-03 MED ORDER — ENOXAPARIN SODIUM 40 MG/0.4ML ~~LOC~~ SOLN
40.0000 mg | Freq: Once | SUBCUTANEOUS | Status: AC
  Administered 2020-10-03: 40 mg via SUBCUTANEOUS

## 2020-10-03 MED ORDER — EPHEDRINE SULFATE 50 MG/ML IJ SOLN
INTRAMUSCULAR | Status: DC | PRN
  Administered 2020-10-03: 5 mg via INTRAVENOUS
  Administered 2020-10-03: 10 mg via INTRAVENOUS
  Administered 2020-10-03 (×2): 5 mg via INTRAVENOUS
  Administered 2020-10-03: 10 mg via INTRAVENOUS
  Administered 2020-10-03 (×2): 5 mg via INTRAVENOUS

## 2020-10-03 MED ORDER — LIDOCAINE HCL (CARDIAC) PF 100 MG/5ML IV SOSY
PREFILLED_SYRINGE | INTRAVENOUS | Status: DC | PRN
  Administered 2020-10-03: 20 mg via INTRATRACHEAL

## 2020-10-03 MED ORDER — GLYCOPYRROLATE 0.2 MG/ML IJ SOLN
INTRAMUSCULAR | Status: DC | PRN
  Administered 2020-10-03: .1 mg via INTRAVENOUS

## 2020-10-03 MED ORDER — SCOPOLAMINE 1 MG/3DAYS TD PT72
1.0000 | MEDICATED_PATCH | Freq: Once | TRANSDERMAL | Status: DC
  Administered 2020-10-03: 1.5 mg via TRANSDERMAL

## 2020-10-03 MED ORDER — CLINDAMYCIN PHOSPHATE 900 MG/50ML IV SOLN
900.0000 mg | INTRAVENOUS | Status: AC
  Administered 2020-10-03: 900 mg via INTRAVENOUS

## 2020-10-03 MED ORDER — BUPIVACAINE LIPOSOME 1.3 % IJ SUSP
INTRAMUSCULAR | Status: DC | PRN
  Administered 2020-10-03 (×2): 5 mL

## 2020-10-03 MED ORDER — PROPOFOL 10 MG/ML IV BOLUS
INTRAVENOUS | Status: DC | PRN
  Administered 2020-10-03: 150 mg via INTRAVENOUS
  Administered 2020-10-03: 50 mg via INTRAVENOUS

## 2020-10-03 MED ORDER — BUPIVACAINE HCL (PF) 0.25 % IJ SOLN
INTRAMUSCULAR | Status: DC | PRN
  Administered 2020-10-03 (×2): 5 mL

## 2020-10-03 MED ORDER — MIDAZOLAM HCL 5 MG/5ML IJ SOLN
INTRAMUSCULAR | Status: DC | PRN
  Administered 2020-10-03 (×2): 1 mg via INTRAVENOUS

## 2020-10-03 MED ORDER — LACTATED RINGERS IV SOLN: INTRAVENOUS | Status: DC

## 2020-10-03 MED ORDER — ONDANSETRON HCL 4 MG/2ML IJ SOLN
INTRAMUSCULAR | Status: DC | PRN
  Administered 2020-10-03: 4 mg via INTRAVENOUS

## 2020-10-03 MED ORDER — DEXAMETHASONE SODIUM PHOSPHATE 4 MG/ML IJ SOLN
INTRAMUSCULAR | Status: DC | PRN
  Administered 2020-10-03: 4 mg via INTRAVENOUS

## 2020-10-03 SURGICAL SUPPLY — 41 items
APL PRP STRL LF DISP 70% ISPRP (MISCELLANEOUS) ×2
APL SKNCLS STERI-STRIP NONHPOA (GAUZE/BANDAGES/DRESSINGS) ×2
BENZOIN TINCTURE PRP APPL 2/3 (GAUZE/BANDAGES/DRESSINGS) ×3 IMPLANT
BIT DRILL 1.7 LNG CANN (DRILL) ×3 IMPLANT
BLADE OSC/SAGITTAL 5.5X25 (BLADE) ×3 IMPLANT
BLADE OSC/SAGITTAL MD 5.5X18 (BLADE) ×3 IMPLANT
BLADE SURG 15 STRL LF DISP TIS (BLADE) ×2 IMPLANT
BLADE SURG 15 STRL SS (BLADE) ×3
BNDG CMPR 75X41 PLY HI ABS (GAUZE/BANDAGES/DRESSINGS) ×2
BNDG COHESIVE 4X5 TAN STRL (GAUZE/BANDAGES/DRESSINGS) ×3 IMPLANT
BNDG ESMARK 4X12 TAN STRL LF (GAUZE/BANDAGES/DRESSINGS) ×3 IMPLANT
BNDG GAUZE 4.5X4.1 6PLY STRL (MISCELLANEOUS) ×3 IMPLANT
BNDG STRETCH 4X75 STRL LF (GAUZE/BANDAGES/DRESSINGS) ×3 IMPLANT
CANISTER SUCT 1200ML W/VALVE (MISCELLANEOUS) ×6 IMPLANT
CHLORAPREP W/TINT 26 (MISCELLANEOUS) ×3 IMPLANT
CNTRSNK DRL 2 HDLS SCR (MISCELLANEOUS) ×2 IMPLANT
COUNTERSINK 2.0 (MISCELLANEOUS) ×3
COVER LIGHT HANDLE UNIVERSAL (MISCELLANEOUS) ×6 IMPLANT
CUFF TOURN SGL QUICK 18X4 (TOURNIQUET CUFF) ×3 IMPLANT
DRAPE FLUOR MINI C-ARM 54X84 (DRAPES) ×3 IMPLANT
ELECT REM PT RETURN 9FT ADLT (ELECTROSURGICAL) ×3
ELECTRODE REM PT RTRN 9FT ADLT (ELECTROSURGICAL) ×2 IMPLANT
GAUZE 4X4 16PLY RFD (DISPOSABLE) ×3 IMPLANT
GAUZE SPONGE 4X4 12PLY STRL (GAUZE/BANDAGES/DRESSINGS) ×3 IMPLANT
GAUZE XEROFORM 1X8 LF (GAUZE/BANDAGES/DRESSINGS) ×3 IMPLANT
GLOVE SURG ENC MOIS LTX SZ7.5 (GLOVE) ×3 IMPLANT
GOWN STRL REUS W/ TWL LRG LVL3 (GOWN DISPOSABLE) ×4 IMPLANT
GOWN STRL REUS W/TWL LRG LVL3 (GOWN DISPOSABLE) ×6
K-Wire ×9 IMPLANT
KIT TURNOVER KIT A (KITS) ×3 IMPLANT
NS IRRIG 500ML POUR BTL (IV SOLUTION) ×3 IMPLANT
PACK EXTREMITY ARMC (MISCELLANEOUS) ×3 IMPLANT
PENCIL SMOKE EVACUATOR (MISCELLANEOUS) ×3 IMPLANT
PIN BALLS 3/8 F/.045 WIRE (MISCELLANEOUS) ×6 IMPLANT
SCREW HEADLESS SHRT THRD 2X12 (Screw) ×3 IMPLANT
STOCKINETTE IMPERVIOUS LG (DRAPES) ×3 IMPLANT
STRIP CLOSURE SKIN 1/4X4 (GAUZE/BANDAGES/DRESSINGS) ×3 IMPLANT
SUT ETHILON 4 0 PS 2 18 (SUTURE) ×12 IMPLANT
SUT VIC AB 4-0 SH 27 (SUTURE) ×3
SUT VIC AB 4-0 SH 27XANBCTRL (SUTURE) ×2 IMPLANT
WIRE SMOOTH TROCAR .9MMX150MML (WIRE) ×3 IMPLANT

## 2020-10-03 NOTE — Anesthesia Postprocedure Evaluation (Signed)
Anesthesia Post Note  Patient: MILI PILTZ  Procedure(s) Performed: WEIL OSTEOTOMY 3RD LEFT (Left Foot) HAMMER TOE CORRECTION 2,3,4 AND Z - PLASTY LEFT (Left Toe)     Patient location during evaluation: PACU Anesthesia Type: General Level of consciousness: awake and alert and oriented Pain management: satisfactory to patient Vital Signs Assessment: post-procedure vital signs reviewed and stable Respiratory status: spontaneous breathing, nonlabored ventilation and respiratory function stable Cardiovascular status: blood pressure returned to baseline and stable Postop Assessment: Adequate PO intake and No signs of nausea or vomiting Anesthetic complications: no   No complications documented.  Raliegh Ip

## 2020-10-03 NOTE — Anesthesia Procedure Notes (Signed)
Procedure Name: LMA Insertion Date/Time: 10/03/2020 12:31 PM Performed by: Cameron Ali, CRNA Pre-anesthesia Checklist: Patient identified, Emergency Drugs available, Suction available, Timeout performed and Patient being monitored Patient Re-evaluated:Patient Re-evaluated prior to induction Oxygen Delivery Method: Circle system utilized Preoxygenation: Pre-oxygenation with 100% oxygen Induction Type: IV induction LMA: LMA inserted LMA Size: 4.0 Number of attempts: 1 Placement Confirmation: positive ETCO2 and breath sounds checked- equal and bilateral Tube secured with: Tape Dental Injury: Teeth and Oropharynx as per pre-operative assessment

## 2020-10-03 NOTE — Op Note (Signed)
Operative note   Surgeon:Shantelle Alles Lawyer: None    Preop diagnosis: 1.  Hammertoe left second toe 2.  Hammertoe left third toe 3.  Hammertoe left fourth toe 4.  Plantar displaced third metatarsal 5.  Skin contracture dorsal second MTPJ 6.  Skin contracture dorsal left fourth MTPJ 7.  Contracture third MTPJ    Postop diagnosis: Same    Procedure: 1.  DIPJ arthrodesis left second toe 2.  DIPJ arthrodesis left third toe 3.  DIPJ arthrodesis left fourth toe 4.  Weil osteotomy third metatarsal 5.  Local skin flap with Z-plasty dorsal second MTPJ 6.  Skin contracture dorsal fourth MTPJ 7.  Fourth MTPJ capsulotomy    EBL: Minimal    Anesthesia:local and general local consisted of a total of 10 cc of Exparel long-acting anesthetic and 0.25% bupivacaine 10 cc    Hemostasis: None    Specimen: None    Complications: None    Operative indications:Claire Strong is an 60 y.o. that presents today for surgical intervention.  The risks/benefits/alternatives/complications have been discussed and consent has been given.    Procedure:  Patient was brought into the OR and placed on the operating table in thesupine position. After anesthesia was obtained theleft lower extremity was prepped and draped in usual sterile fashion.  Attention was directed to the DIPJ's of the left third fourth and fifth toes where transverse incisions were made overlying the DIPJ.  Elliptical skin flaps were created.  Full-thickness dissection was carried down to the extensor tendons.  Transverse tenotomy were performed.  The DIPJ's were then exposed.  The head of the middle phalanx and base of the distal phalanx were then excised with a bone saw to the third fourth and fifth DIPJ's.  This was taken down to good healthy bleeding bone.  Realignment of the distal toe was noted to all digits.  At this time the dorsal skin flap between the second and third metatarsophalangeal joint regions [place] skin flap was created.   Full-thickness flaps were created down to the level of the third metatarsophalangeal joint.  A longitudinal extensor tenotomy was performed for Z-lengthening.  The third metatarsophalangeal joint was then exposed at this time.  A McGlamry elevator was introduced to release the plantar plate.  Next a Weil osteotomy was created from distal to proximal.  This was stabilized with a 2.0 mm headless Paragon compression screw.  Good stability and alignment was noted.  The fragment had proximally located approximately 4 mm.  Attention was then directed to the fourth MTPJ where Z-plasty skin flaps were created.  Full-thickness flaps were performed.  Sharp and blunt dissection carried down to the fourth MTPJ.  Noted contracture of the toe was found at this time.  The dorsal medial and lateral capsule was released.  A McGlamry elevator was used to release the entire plantar plate.  Better realignment of the fourth toe was noted at this time.  An extensor tenotomy Z-lengthening was performed at this time.  The toes were then placed in a rectus position.  0.045 K wires were driven from the distal phalanx distally and retrograded proximally.  Was able to place the 0.045 K wires proximal crossing the MTPJ of the second and fourth metatarsophalangeal joints.  Was not able to cross the third metatarsophalangeal joint due to the implant in the proximal phalanx of the third toe.  I was just not able to pass it around it into an acceptable position.  Good alignment of the  digits were noted clinically.  At this time the extensor tendons were reapproximated with a 4-0 Vicryl.  The skin flaps were then created and Z-lengthening's were performed with the skin with reapproximation.  Good realignment and decreased tension was noted at this time.  The skin was closed with a 4-0 nylon throughout.  Bulky sterile dressings were applied.  Local anesthetic was performed at the end of the case as well.  Patient will be allowed to weight-bear on  her heel in a flat postop shoe postoperatively.    Patient tolerated the procedure and anesthesia well.  Was transported from the OR to the PACU with all vital signs stable and vascular status intact. To be discharged per routine protocol.  Will follow up in approximately 1 week in the outpatient clinic.

## 2020-10-03 NOTE — Transfer of Care (Signed)
Immediate Anesthesia Transfer of Care Note  Patient: Claire Strong  Procedure(s) Performed: WEIL OSTEOTOMY 3RD LEFT (Left Foot) HAMMER TOE CORRECTION 2,3,4 AND Z - PLASTY LEFT (Left Toe)  Patient Location: PACU  Anesthesia Type: General LMA  Level of Consciousness: awake, alert  and patient cooperative  Airway and Oxygen Therapy: Patient Spontanous Breathing and Patient connected to supplemental oxygen  Post-op Assessment: Post-op Vital signs reviewed, Patient's Cardiovascular Status Stable, Respiratory Function Stable, Patent Airway and No signs of Nausea or vomiting  Post-op Vital Signs: Reviewed and stable  Complications: No complications documented.

## 2020-10-03 NOTE — H&P (Signed)
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  The various methods of treatment have been discussed with the patient.  No guarantees were given.  After consideration of risks, benefits and other options for treatment, the patient has consented to surgery.  I have reviewed the patients' chart and labs.     A history and physical examination was performed in my office.  The patient was reexamined.  There have been no changes to this history and physical examination.  Samara Deist A

## 2020-10-03 NOTE — Anesthesia Preprocedure Evaluation (Signed)
Anesthesia Evaluation  Patient identified by MRN, date of birth, ID band Patient awake    Reviewed: Allergy & Precautions, H&P , NPO status , Patient's Chart, lab work & pertinent test results  History of Anesthesia Complications (+) PONV and history of anesthetic complications  Airway Mallampati: II  TM Distance: >3 FB Neck ROM: full    Dental no notable dental hx.    Pulmonary asthma , sleep apnea , former smoker,    Pulmonary exam normal breath sounds clear to auscultation       Cardiovascular hypertension, + Peripheral Vascular Disease  Normal cardiovascular exam Rhythm:regular Rate:Normal     Neuro/Psych PSYCHIATRIC DISORDERS    GI/Hepatic   Endo/Other  Hypothyroidism   Renal/GU      Musculoskeletal   Abdominal   Peds  Hematology Lupus   Anesthesia Other Findings   Reproductive/Obstetrics                             Anesthesia Physical Anesthesia Plan  ASA: III  Anesthesia Plan: General LMA   Post-op Pain Management:    Induction:   PONV Risk Score and Plan: 4 or greater and Treatment may vary due to age or medical condition, Ondansetron, Dexamethasone, Scopolamine patch - Pre-op and Midazolam  Airway Management Planned:   Additional Equipment:   Intra-op Plan:   Post-operative Plan:   Informed Consent: I have reviewed the patients History and Physical, chart, labs and discussed the procedure including the risks, benefits and alternatives for the proposed anesthesia with the patient or authorized representative who has indicated his/her understanding and acceptance.     Dental Advisory Given  Plan Discussed with: CRNA  Anesthesia Plan Comments:         Anesthesia Quick Evaluation

## 2020-10-03 NOTE — Discharge Instructions (Signed)
[place] DR. TROXLER, DR. Vickki Muff, AND DR. Elmwood   1. Take your medication as prescribed.  Pain medication should be taken only as needed.  2. Keep the dressing clean, dry and intact.  3. Keep your foot elevated above the heart level for the first 48 hours.  4. Walking to the bathroom and brief periods of walking are acceptable, unless we have instructed you to be non-weight bearing.  5. Always wear your post-op shoe when walking.  Always use your crutches if you are to be non-weight bearing.  6. Do not take a shower. Baths are permissible as long as the foot is kept out of the water.   7. Every hour you are awake:  - Bend your knee 15 times. - Flex foot 15 times - Massage calf 15 times  8. Call [facility] [phone]) if any of the following problems occur: - You develop a temperature or fever. - The bandage becomes saturated with blood. - Medication does not stop your pain. - Injury of the foot occurs. - Any symptoms of infection including redness, odor, or red streaks running from wound.   General Anesthesia, Adult, Care After This sheet gives you information about how to care for yourself after your procedure. Your health care provider may also give you more specific instructions. If you have problems or questions, contact your health care provider. What can I expect after the procedure? After the procedure, the following side effects are common:  Pain or discomfort at the IV site.  Nausea.  Vomiting.  Sore throat.  Trouble concentrating.  Feeling cold or chills.  Feeling weak or tired.  Sleepiness and fatigue.  Soreness and body aches. These side effects can affect parts of the body that were not involved in surgery. Follow these instructions at home: For the time period you were told by your health care provider:  Rest.  Do not  participate in activities where you could fall or become injured.  Do not drive or use machinery.  Do not drink alcohol.  Do not take sleeping pills or medicines that cause drowsiness.  Do not make important decisions or sign legal documents.  Do not take care of children on your own.   Eating and drinking  Follow any instructions from your health care provider about eating or drinking restrictions.  When you feel hungry, start by eating small amounts of foods that are soft and easy to digest (bland), such as toast. Gradually return to your regular diet.  Drink enough fluid to keep your urine pale yellow.  If you vomit, rehydrate by drinking water, juice, or clear broth. General instructions  If you have sleep apnea, surgery and certain medicines can increase your risk for breathing problems. Follow instructions from your health care provider about wearing your sleep device: ? Anytime you are sleeping, including during daytime naps. ? While taking prescription pain medicines, sleeping medicines, or medicines that make you drowsy.  Have a responsible adult stay with you for the time you are told. It is important to have someone help care for you until you are awake and alert.  Return to your normal activities as told by your health care provider. Ask your health care provider what activities are safe for you.  Take over-the-counter and prescription medicines only as told by your health care provider.  If you smoke, do not smoke without supervision.  Keep all follow-up visits  as told by your health care provider. This is important. Contact a health care provider if:  You have nausea or vomiting that does not get better with medicine.  You cannot eat or drink without vomiting.  You have pain that does not get better with medicine.  You are unable to pass urine.  You develop a skin rash.  You have a fever.  You have redness around your IV site that gets worse. Get help  right away if:  You have difficulty breathing.  You have chest pain.  You have blood in your urine or stool, or you vomit blood. Summary  After the procedure, it is common to have a sore throat or nausea. It is also common to feel tired.  Have a responsible adult stay with you for the time you are told. It is important to have someone help care for you until you are awake and alert.  When you feel hungry, start by eating small amounts of foods that are soft and easy to digest (bland), such as toast. Gradually return to your regular diet.  Drink enough fluid to keep your urine pale yellow.  Return to your normal activities as told by your health care provider. Ask your health care provider what activities are safe for you. This information is not intended to replace advice given to you by your health care provider. Make sure you discuss any questions you have with your health care provider. Document Revised: 02/16/2020 Document Reviewed: 09/15/2019 Elsevier Patient Education  2021 [place].  Scopolamine skin patches Remove in 72 hrs. Wash hands immediately after removal. What is this medicine? SCOPOLAMINE (skoe POL a meen) is used to prevent nausea and vomiting caused by motion sickness, anesthesia and surgery. This medicine may be used for other purposes; ask your health care provider or pharmacist if you have questions. COMMON BRAND NAME(S): Transderm Scop What should I tell my health care provider before I take this medicine? They need to know if you have any of these conditions:  are scheduled to have a gastric secretion test  glaucoma  heart disease  kidney disease  liver disease  lung or breathing disease, like asthma  mental illness  prostate disease  seizures  stomach or intestine problems  trouble passing urine  an unusual or allergic reaction to scopolamine, atropine, other medicines, foods, dyes, or preservatives  pregnant or trying to get  pregnant  breast-feeding How should I use this medicine? This medicine is for external use only. Follow the directions on the prescription label. Wear only 1 patch at a time. Choose an area behind the ear, that is clean, dry, hairless and free from any cuts or irritation. Wipe the area with a clean dry tissue. Peel off the plastic backing of the skin patch, trying not to touch the adhesive side with your hands. Do not cut the patches. Firmly apply to the area you have chosen, with the metallic side of the patch to the skin and the tan-colored side showing. Once firmly in place, wash your hands well with soap and water. Do not get this medicine into your eyes. After removing the patch, wash your hands and the area behind your ear thoroughly with soap and water. The patch will still contain some medicine after use. To avoid accidental contact or ingestion by children or pets, fold the used patch in half with the sticky side together and throw away in the trash out of the reach of children and pets. If you  need to use a second patch after you remove the first, place it behind the other ear. A special MedGuide will be given to you by the pharmacist with each prescription and refill. Be sure to read this information carefully each time. Talk to your pediatrician regarding the use of this medicine in children. Special care may be needed. Overdosage: If you think you have taken too much of this medicine contact a poison control center or emergency room at once. NOTE: This medicine is only for you. Do not share this medicine with others. What if I miss a dose? This does not apply. This medicine is not for regular use. What may interact with this medicine?  alcohol  antihistamines for allergy cough and cold  atropine  certain medicines for anxiety or sleep  certain medicines for bladder problems like oxybutynin, tolterodine  certain medicines for depression like amitriptyline, fluoxetine,  sertraline  certain medicines for stomach problems like dicyclomine, hyoscyamine  certain medicines for Parkinson's disease like benztropine, trihexyphenidyl  certain medicines for seizures like phenobarbital, primidone  general anesthetics like halothane, isoflurane, methoxyflurane, propofol  ipratropium  local anesthetics like lidocaine, pramoxine, tetracaine  medicines that relax muscles for surgery  phenothiazines like chlorpromazine, mesoridazine, prochlorperazine, thioridazine  narcotic medicines for pain  other belladonna alkaloids This list may not describe all possible interactions. Give your health care provider a list of all the medicines, herbs, non-prescription drugs, or dietary supplements you use. Also tell them if you smoke, drink alcohol, or use illegal drugs. Some items may interact with your medicine. What should I watch for while using this medicine? Limit contact with water while swimming and bathing because the patch may fall off. If the patch falls off, throw it away and put a new one behind the other ear. You may get drowsy or dizzy. Do not drive, use machinery, or do anything that needs mental alertness until you know how this medicine affects you. Do not stand or sit up quickly, especially if you are an older patient. This reduces the risk of dizzy or fainting spells. Alcohol may interfere with the effect of this medicine. Avoid alcoholic drinks. Your mouth may get dry. Chewing sugarless gum or sucking hard candy, and drinking plenty of water may help. Contact your healthcare professional if the problem does not go away or is severe. This medicine may cause dry eyes and blurred vision. If you wear contact lenses, you may feel some discomfort. Lubricating drops may help. See your healthcare professional if the problem does not go away or is severe. If you are going to need surgery, an MRI, CT scan, or other procedure, tell your healthcare professional that you are  using this medicine. You may need to remove the patch before the procedure. What side effects may I notice from receiving this medicine? Side effects that you should report to your doctor or health care professional as soon as possible:  allergic reactions like skin rash, itching or hives; swelling of the face, lips, or tongue  blurred vision  changes in vision  confusion  dizziness  eye pain  fast, irregular heartbeat  hallucinations, loss of contact with reality  nausea, vomiting  pain or trouble passing urine  restlessness  seizures  skin irritation  stomach pain Side effects that usually do not require medical attention (report to your doctor or health care professional if they continue or are bothersome):  drowsiness  dry mouth  headache  sore throat This list may not describe all possible  side effects. Call your doctor for medical advice about side effects. You may report side effects to FDA at 1-800-FDA-1088. Where should I keep my medicine? Keep out of the reach of children. Store at room temperature between 20 and 25 degrees C (68 and 77 degrees F). Keep this medicine in the foil package until ready to use. Throw away any unused medicine after the expiration date. NOTE: This sheet is a summary. It may not cover all possible information. If you have questions about this medicine, talk to your doctor, pharmacist, or health care provider.  2021 Elsevier/Gold Standard (2017-08-21 16:14:46)

## 2020-10-04 ENCOUNTER — Encounter: Payer: Self-pay | Admitting: Podiatry

## 2020-10-09 DIAGNOSIS — Z9889 Other specified postprocedural states: Secondary | ICD-10-CM | POA: Diagnosis not present

## 2020-10-22 DIAGNOSIS — L02612 Cutaneous abscess of left foot: Secondary | ICD-10-CM | POA: Diagnosis not present

## 2020-10-23 ENCOUNTER — Encounter: Payer: Self-pay | Admitting: Podiatry

## 2020-10-23 ENCOUNTER — Other Ambulatory Visit: Payer: Self-pay | Admitting: Podiatry

## 2020-10-24 ENCOUNTER — Ambulatory Visit: Payer: Medicare Other | Admitting: Anesthesiology

## 2020-10-24 ENCOUNTER — Encounter: Payer: Self-pay | Admitting: Podiatry

## 2020-10-24 ENCOUNTER — Other Ambulatory Visit: Payer: Self-pay

## 2020-10-24 ENCOUNTER — Ambulatory Visit
Admission: RE | Admit: 2020-10-24 | Discharge: 2020-10-24 | Disposition: A | Discharge status: Home / Self Care | Payer: Medicare Other | Attending: Podiatry | Admitting: Podiatry

## 2020-10-24 ENCOUNTER — Encounter
Admission: RE | Disposition: A | Discharge status: Home / Self Care | Payer: Self-pay | Source: Home / Self Care | Attending: Podiatry

## 2020-10-24 DIAGNOSIS — Y838 Other surgical procedures as the cause of abnormal reaction of the patient, or of later complication, without mention of misadventure at the time of the procedure: Secondary | ICD-10-CM | POA: Insufficient documentation

## 2020-10-24 DIAGNOSIS — L02612 Cutaneous abscess of left foot: Secondary | ICD-10-CM | POA: Insufficient documentation

## 2020-10-24 DIAGNOSIS — Z86718 Personal history of other venous thrombosis and embolism: Secondary | ICD-10-CM | POA: Diagnosis not present

## 2020-10-24 DIAGNOSIS — Z9884 Bariatric surgery status: Secondary | ICD-10-CM | POA: Insufficient documentation

## 2020-10-24 DIAGNOSIS — T8149XA Infection following a procedure, other surgical site, initial encounter: Secondary | ICD-10-CM | POA: Diagnosis not present

## 2020-10-24 DIAGNOSIS — T8140XA Infection following a procedure, unspecified, initial encounter: Secondary | ICD-10-CM | POA: Insufficient documentation

## 2020-10-24 HISTORY — PX: INCISION AND DRAINAGE OF WOUND: SHX1803

## 2020-10-24 SURGERY — IRRIGATION AND DEBRIDEMENT WOUND
Anesthesia: General | Site: Foot | Laterality: Left

## 2020-10-24 MED ORDER — LIDOCAINE-EPINEPHRINE 1 %-1:100000 IJ SOLN
INTRAMUSCULAR | Status: DC | PRN
  Administered 2020-10-24: 8 mL via INTRAMUSCULAR

## 2020-10-24 MED ORDER — METOCLOPRAMIDE HCL 5 MG PO TABS
5.0000 mg | ORAL_TABLET | Freq: Three times a day (TID) | ORAL | Status: DC | PRN
Start: 2020-10-24 — End: 2020-10-24

## 2020-10-24 MED ORDER — GLYCOPYRROLATE 0.2 MG/ML IJ SOLN
INTRAMUSCULAR | Status: DC | PRN
  Administered 2020-10-24: .1 mg via INTRAVENOUS

## 2020-10-24 MED ORDER — LIDOCAINE HCL (CARDIAC) PF 100 MG/5ML IV SOSY
PREFILLED_SYRINGE | INTRAVENOUS | Status: DC | PRN
  Administered 2020-10-24: 60 mg via INTRAVENOUS

## 2020-10-24 MED ORDER — CLINDAMYCIN PHOSPHATE 900 MG/50ML IV SOLN
900.0000 mg | INTRAVENOUS | Status: AC
  Administered 2020-10-24: 900 mg via INTRAVENOUS

## 2020-10-24 MED ORDER — LACTATED RINGERS IV SOLN: INTRAVENOUS | Status: DC

## 2020-10-24 MED ORDER — ONDANSETRON HCL 4 MG PO TABS: 4.0000 mg | ORAL_TABLET | Freq: Every day | ORAL | 1 refills | Status: AC | PRN

## 2020-10-24 MED ORDER — SCOPOLAMINE 1 MG/3DAYS TD PT72
1.0000 | MEDICATED_PATCH | TRANSDERMAL | Status: DC
  Administered 2020-10-24: 1.5 mg via TRANSDERMAL

## 2020-10-24 MED ORDER — PROPOFOL 10 MG/ML IV BOLUS
INTRAVENOUS | Status: DC | PRN
  Administered 2020-10-24: 50 mg via INTRAVENOUS

## 2020-10-24 MED ORDER — ONDANSETRON HCL 4 MG/2ML IJ SOLN: 4.0000 mg | Freq: Four times a day (QID) | INTRAMUSCULAR | Status: DC | PRN

## 2020-10-24 MED ORDER — FENTANYL CITRATE (PF) 100 MCG/2ML IJ SOLN
INTRAMUSCULAR | Status: DC | PRN
  Administered 2020-10-24: 50 ug via INTRAVENOUS

## 2020-10-24 MED ORDER — PROPOFOL 500 MG/50ML IV EMUL
INTRAVENOUS | Status: DC | PRN
  Administered 2020-10-24: 120 ug/kg/min via INTRAVENOUS

## 2020-10-24 MED ORDER — METOCLOPRAMIDE HCL 5 MG/ML IJ SOLN
5.0000 mg | Freq: Three times a day (TID) | INTRAMUSCULAR | Status: DC | PRN
Start: 2020-10-24 — End: 2020-10-24

## 2020-10-24 MED ORDER — ONDANSETRON HCL 4 MG PO TABS: 4.0000 mg | ORAL_TABLET | Freq: Four times a day (QID) | ORAL | Status: DC | PRN

## 2020-10-24 MED ORDER — MIDAZOLAM HCL 2 MG/2ML IJ SOLN
INTRAMUSCULAR | Status: DC | PRN
  Administered 2020-10-24: 2 mg via INTRAVENOUS

## 2020-10-24 MED ORDER — OXYCODONE-ACETAMINOPHEN 5-325 MG PO TABS: 1.0000 | ORAL_TABLET | Freq: Four times a day (QID) | ORAL | 0 refills | Status: DC | PRN

## 2020-10-24 SURGICAL SUPPLY — 19 items
APL PRP STRL LF DISP 70% ISPRP (MISCELLANEOUS) ×1
BNDG CMPR 75X41 PLY HI ABS (GAUZE/BANDAGES/DRESSINGS) ×1
BNDG ELASTIC 4X5.8 VLCR STR LF (GAUZE/BANDAGES/DRESSINGS) ×1 IMPLANT
BNDG STRETCH 4X75 STRL LF (GAUZE/BANDAGES/DRESSINGS) ×1 IMPLANT
CANISTER SUCT 1200ML W/VALVE (MISCELLANEOUS) ×2 IMPLANT
CHLORAPREP W/TINT 26 (MISCELLANEOUS) ×1 IMPLANT
COVER LIGHT HANDLE UNIVERSAL (MISCELLANEOUS) ×2 IMPLANT
ELECT REM PT RETURN 9FT ADLT (ELECTROSURGICAL) ×2
ELECTRODE REM PT RTRN 9FT ADLT (ELECTROSURGICAL) ×1 IMPLANT
GAUZE PACKING 1/4 X5 YD (GAUZE/BANDAGES/DRESSINGS) ×1 IMPLANT
GAUZE SPONGE 4X4 12PLY STRL (GAUZE/BANDAGES/DRESSINGS) ×2 IMPLANT
GLOVE SURG UNDER POLY LF SZ7.5 (GLOVE) ×2 IMPLANT
KIT TURNOVER KIT A (KITS) ×2 IMPLANT
NS IRRIG 500ML POUR BTL (IV SOLUTION) ×2 IMPLANT
PACK EXTREMITY ARMC (MISCELLANEOUS) ×2 IMPLANT
PAD ABD DERMACEA PRESS 5X9 (GAUZE/BANDAGES/DRESSINGS) ×1 IMPLANT
PENCIL SMOKE EVACUATOR (MISCELLANEOUS) ×2 IMPLANT
STOCKINETTE IMPERVIOUS 9X36 MD (GAUZE/BANDAGES/DRESSINGS) ×2 IMPLANT
SUT ETHILON 5-0 FS-2 18 BLK (SUTURE) ×1 IMPLANT

## 2020-10-24 NOTE — Discharge Instructions (Signed)
[place] DR. TROXLER, DR. Vickki Muff, AND DR. Wayne   1. Take your medication as prescribed.  Pain medication should be taken only as needed.  2. Keep the dressing clean, dry and intact.  3. Keep your foot elevated above the heart level for the first 48 hours.  4. Walking to the bathroom and brief periods of walking are acceptable, unless we have instructed you to be non-weight bearing.  5. Always wear your post-op shoe when walking.  Always use your crutches if you are to be non-weight bearing.  6. Do not take a shower. Baths are permissible as long as the foot is kept out of the water.   7. Every hour you are awake:  - Bend your knee 15 times. - Flex foot 15 times - Massage calf 15 times  8. Call [facility] [phone]) if any of the following problems occur: - You develop a temperature or fever. - The bandage becomes saturated with blood. - Medication does not stop your pain. - Injury of the foot occurs. - Any symptoms of infection including redness, odor, or red streaks running from wound.   General Anesthesia, Adult, Care After This sheet gives you information about how to care for yourself after your procedure. Your health care provider may also give you more specific instructions. If you have problems or questions, contact your health care provider. What can I expect after the procedure? After the procedure, the following side effects are common:  Pain or discomfort at the IV site.  Nausea.  Vomiting.  Sore throat.  Trouble concentrating.  Feeling cold or chills.  Feeling weak or tired.  Sleepiness and fatigue.  Soreness and body aches. These side effects can affect parts of the body that were not involved in surgery. Follow these instructions at home: For the time period you were told by your health care provider:  Rest.  Do not  participate in activities where you could fall or become injured.  Do not drive or use machinery.  Do not drink alcohol.  Do not take sleeping pills or medicines that cause drowsiness.  Do not make important decisions or sign legal documents.  Do not take care of children on your own.   Eating and drinking  Follow any instructions from your health care provider about eating or drinking restrictions.  When you feel hungry, start by eating small amounts of foods that are soft and easy to digest (bland), such as toast. Gradually return to your regular diet.  Drink enough fluid to keep your urine pale yellow.  If you vomit, rehydrate by drinking water, juice, or clear broth. General instructions  If you have sleep apnea, surgery and certain medicines can increase your risk for breathing problems. Follow instructions from your health care provider about wearing your sleep device: ? Anytime you are sleeping, including during daytime naps. ? While taking prescription pain medicines, sleeping medicines, or medicines that make you drowsy.  Have a responsible adult stay with you for the time you are told. It is important to have someone help care for you until you are awake and alert.  Return to your normal activities as told by your health care provider. Ask your health care provider what activities are safe for you.  Take over-the-counter and prescription medicines only as told by your health care provider.  If you smoke, do not smoke without supervision.  Keep all follow-up visits  as told by your health care provider. This is important. Contact a health care provider if:  You have nausea or vomiting that does not get better with medicine.  You cannot eat or drink without vomiting.  You have pain that does not get better with medicine.  You are unable to pass urine.  You develop a skin rash.  You have a fever.  You have redness around your IV site that gets worse. Get help  right away if:  You have difficulty breathing.  You have chest pain.  You have blood in your urine or stool, or you vomit blood. Summary  After the procedure, it is common to have a sore throat or nausea. It is also common to feel tired.  Have a responsible adult stay with you for the time you are told. It is important to have someone help care for you until you are awake and alert.  When you feel hungry, start by eating small amounts of foods that are soft and easy to digest (bland), such as toast. Gradually return to your regular diet.  Drink enough fluid to keep your urine pale yellow.  Return to your normal activities as told by your health care provider. Ask your health care provider what activities are safe for you. This information is not intended to replace advice given to you by your health care provider. Make sure you discuss any questions you have with your health care provider. Document Revised: 02/16/2020 Document Reviewed: 09/15/2019 Elsevier Patient Education  [place].  Scopolamine skin patches REMOVE PATCH IN 72 HOURS AND [place] HANDS IMMEDIATELY  What is this medicine? SCOPOLAMINE (skoe POL a meen) is used to prevent nausea and vomiting caused by motion sickness, anesthesia and surgery. This medicine may be used for other purposes; ask your health care provider or pharmacist if you have questions. COMMON BRAND NAME(S): Transderm Scop What should I tell my health care provider before I take this medicine? They need to know if you have any of these conditions: are scheduled to have a gastric secretion test glaucoma heart disease kidney disease liver disease lung or breathing disease, like asthma mental illness prostate disease seizures stomach or intestine problems trouble passing urine an unusual or allergic reaction to scopolamine, atropine, other medicines, foods, dyes, or preservatives pregnant or trying to get pregnant breast-feeding How  should I use this medicine? This medicine is for external use only. Follow the directions on the prescription label. Wear only 1 patch at a time. Choose an area behind the ear, that is clean, dry, hairless and free from any cuts or irritation. Wipe the area with a clean dry tissue. Peel off the plastic backing of the skin patch, trying not to touch the adhesive side with your hands. Do not cut the patches. Firmly apply to the area you have chosen, with the metallic side of the patch to the skin and the tan-colored side showing. Once firmly in place, wash your hands well with soap and water. Do not get this medicine into your eyes. After removing the patch, wash your hands and the area behind your ear thoroughly with soap and water. The patch will still contain some medicine after use. To avoid accidental contact or ingestion by children or pets, fold the used patch in half with the sticky side together and throw away in the trash out of the reach of children and pets. If you need to use a second patch after you remove the first, place it  behind the other ear. A special MedGuide will be given to you by the pharmacist with each prescription and refill. Be sure to read this information carefully each time. Talk to your pediatrician regarding the use of this medicine in children. Special care may be needed. Overdosage: If you think you have taken too much of this medicine contact a poison control center or emergency room at once. NOTE: This medicine is only for you. Do not share this medicine with others. What if I miss a dose? This does not apply. This medicine is not for regular use. What may interact with this medicine? alcohol antihistamines for allergy cough and cold atropine certain medicines for anxiety or sleep certain medicines for bladder problems like oxybutynin, tolterodine certain medicines for depression like amitriptyline, fluoxetine, sertraline certain medicines for stomach problems like  dicyclomine, hyoscyamine certain medicines for Parkinson's disease like benztropine, trihexyphenidyl certain medicines for seizures like phenobarbital, primidone general anesthetics like halothane, isoflurane, methoxyflurane, propofol ipratropium local anesthetics like lidocaine, pramoxine, tetracaine medicines that relax muscles for surgery phenothiazines like chlorpromazine, mesoridazine, prochlorperazine, thioridazine narcotic medicines for pain other belladonna alkaloids This list may not describe all possible interactions. Give your health care provider a list of all the medicines, herbs, non-prescription drugs, or dietary supplements you use. Also tell them if you smoke, drink alcohol, or use illegal drugs. Some items may interact with your medicine. What should I watch for while using this medicine? Limit contact with water while swimming and bathing because the patch may fall off. If the patch falls off, throw it away and put a new one behind the other ear. You may get drowsy or dizzy. Do not drive, use machinery, or do anything that needs mental alertness until you know how this medicine affects you. Do not stand or sit up quickly, especially if you are an older patient. This reduces the risk of dizzy or fainting spells. Alcohol may interfere with the effect of this medicine. Avoid alcoholic drinks. Your mouth may get dry. Chewing sugarless gum or sucking hard candy, and drinking plenty of water may help. Contact your healthcare professional if the problem does not go away or is severe. This medicine may cause dry eyes and blurred vision. If you wear contact lenses, you may feel some discomfort. Lubricating drops may help. See your healthcare professional if the problem does not go away or is severe. If you are going to need surgery, an MRI, CT scan, or other procedure, tell your healthcare professional that you are using this medicine. You may need to remove the patch before the  procedure. What side effects may I notice from receiving this medicine? Side effects that you should report to your doctor or health care professional as soon as possible: allergic reactions like skin rash, itching or hives; swelling of the face, lips, or tongue blurred vision changes in vision confusion dizziness eye pain fast, irregular heartbeat hallucinations, loss of contact with reality nausea, vomiting pain or trouble passing urine restlessness seizures skin irritation stomach pain Side effects that usually do not require medical attention (report to your doctor or health care professional if they continue or are bothersome): drowsiness dry mouth headache sore throat This list may not describe all possible side effects. Call your doctor for medical advice about side effects. You may report side effects to FDA at 1-800-FDA-1088. Where should I keep my medicine? Keep out of the reach of children. Store at room temperature between 20 and 25 degrees C (68 and 77 degrees  F). Keep this medicine in the foil package until ready to use. Throw away any unused medicine after the expiration date. NOTE: This sheet is a summary. It may not cover all possible information. If you have questions about this medicine, talk to your doctor, pharmacist, or health care provider.  2021 Elsevier/Gold Standard (2017-08-21 16:14:46)

## 2020-10-24 NOTE — Anesthesia Preprocedure Evaluation (Addendum)
Anesthesia Evaluation  Patient identified by MRN, date of birth, ID band Patient awake    Reviewed: Allergy & Precautions, H&P , NPO status , Patient's Chart, lab work & pertinent test results  History of Anesthesia Complications (+) PONV and history of anesthetic complications  Airway Mallampati: II  TM Distance: >3 FB Neck ROM: full    Dental no notable dental hx.    Pulmonary asthma (mild) , former smoker,  S/P gastric bypass 2006   Pulmonary exam normal breath sounds clear to auscultation       Cardiovascular hypertension, + Peripheral Vascular Disease (varicose veins; Chronic venous insufficiency)  Normal cardiovascular exam+ dysrhythmias (Atrial tachycardia )  Rhythm:regular Rate:Normal  ekg: SB at 52;   Neuro/Psych  Headaches, PSYCHIATRIC DISORDERS Anxiety Depression    GI/Hepatic negative GI ROS, Neg liver ROS,   Endo/Other  Hypothyroidism   Renal/GU Renal disease (horseshoe kidney)     Musculoskeletal  (+) Arthritis ,   Abdominal   Peds  Hematology Lupus anticoagulant disorder;  DVT (years ago);   Anesthesia Other Findings had foot surgery on 4/22;  pcp: Virginia Crews, MD at 09/07/2020 ;  Last asprin: 5/9;   Reproductive/Obstetrics                            Anesthesia Physical  Anesthesia Plan  ASA: III  Anesthesia Plan: General   Post-op Pain Management:    Induction:   PONV Risk Score and Plan: 4 or greater and Treatment may vary due to age or medical condition, Ondansetron, Scopolamine patch - Pre-op and Midazolam  Airway Management Planned:   Additional Equipment:   Intra-op Plan:   Post-operative Plan:   Informed Consent: I have reviewed the patients History and Physical, chart, labs and discussed the procedure including the risks, benefits and alternatives for the proposed anesthesia with the patient or authorized representative who has indicated  his/her understanding and acceptance.     Dental Advisory Given  Plan Discussed with: CRNA  Anesthesia Plan Comments:        Anesthesia Quick Evaluation

## 2020-10-24 NOTE — Op Note (Signed)
Operative note   Surgeon:Tsuneo Faison Lawyer: None    Preop diagnosis: Postoperative infection left fourth MTPJ and fourth toe    Postop diagnosis: Same    Procedure: I&D abscess left fourth MTPJ and fourth toe DIPJ    EBL: Minimal    Anesthesia:local and IV sedation    Hemostasis: None    Specimen: Deep wound culture left fourth MTPJ    Complications: None    Operative indications:Claire Strong is an 60 y.o. that presents today for surgical intervention.  The risks/benefits/alternatives/complications have been discussed and consent has been given.    Procedure:  Patient was brought into the OR and placed on the operating table in thesupine position. After anesthesia was obtained theleft lower extremity was prepped and draped in usual sterile fashion.  Attention was directed to the dorsal aspect of the left foot at the level of the metatarsophalangeal joint.  Previous incision from approximately 3 weeks ago was noted.  A small stab incision had been placed 2 days ago by myself in the outpatient clinic.  At this point the small stab incision was then entered.  This measured approximately 6 mm in length.  Blunt dissection was carried down to the level of the metatarsophalangeal joint.  Mild amount of purulent drainage was noted.  Comparatively to the outpatient clinic this was remarkably reduced from previous.  Potentials also elected to the DIPJ of the fourth toe where the previous incision was noted.  This was also bluntly dissected open approximately 1 cm in length.  Both wounds were then flushed with copious amounts of sterile saline.  A deep wound culture was performed into the fourth MTPJ.  After copious irrigation.  The dizzy IPJ was loosely closed with the central portion of the incision was left open.  5-0 nylon was used to close the medial and lateral aspect of the incision site.  The dorsal fourth MTPJ was packed with plain Nu Gauze packing.  A bulky sterile dressing was  applied to the left foot.    Patient tolerated the procedure and anesthesia well.  Was transported from the OR to the PACU with all vital signs stable and vascular status intact. To be discharged per routine protocol.  Will follow up in approximately 1 week in the outpatient clinic.

## 2020-10-24 NOTE — Anesthesia Procedure Notes (Signed)
Date/Time: 10/24/2020 2:06 PM Performed by: Dionne Bucy, CRNA Pre-anesthesia Checklist: Patient identified, Emergency Drugs available, Suction available, Patient being monitored and Timeout performed Patient Re-evaluated:Patient Re-evaluated prior to induction Oxygen Delivery Method: Simple face mask Placement Confirmation: positive ETCO2

## 2020-10-24 NOTE — Transfer of Care (Signed)
Immediate Anesthesia Transfer of Care Note  Patient: Claire Strong  Procedure(s) Performed: IRRIGATION AND DEBRIDEMENT; BELOW FASCIA FOOT; MULTIPLE (Left )  Patient Location: PACU  Anesthesia Type: General  Level of Consciousness: awake, alert  and patient cooperative  Airway and Oxygen Therapy: Patient Spontanous Breathing and Patient connected to supplemental oxygen  Post-op Assessment: Post-op Vital signs reviewed, Patient's Cardiovascular Status Stable, Respiratory Function Stable, Patent Airway and No signs of Nausea or vomiting  Post-op Vital Signs: Reviewed and stable  Complications: No complications documented.

## 2020-10-24 NOTE — H&P (Signed)
HISTORY AND PHYSICAL INTERVAL NOTE:  10/24/2020  1:36 PM  Claire Strong  has presented today for surgery, with the diagnosis of L02.612- ABCESS OF LEFT FOOT INCLUDING TOES.  The various methods of treatment have been discussed with the patient.  No guarantees were given.  After consideration of risks, benefits and other options for treatment, the patient has consented to surgery.  I have reviewed the patients' chart and labs.     A history and physical examination was performed in my office.  The patient was reexamined.  There have been no changes to this history and physical examination.  Samara Deist A

## 2020-10-24 NOTE — Anesthesia Postprocedure Evaluation (Signed)
Anesthesia Post Note  Patient: Claire Strong  Procedure(s) Performed: IRRIGATION AND DEBRIDEMENT; BELOW FASCIA FOOT; MULTIPLE (Left Foot)     Patient location during evaluation: PACU Anesthesia Type: General Level of consciousness: awake and alert Pain management: pain level controlled Vital Signs Assessment: post-procedure vital signs reviewed and stable Respiratory status: spontaneous breathing, nonlabored ventilation, respiratory function stable and patient connected to nasal cannula oxygen Cardiovascular status: blood pressure returned to baseline and stable Postop Assessment: no apparent nausea or vomiting Anesthetic complications: no   No complications documented.  Fidel Levy

## 2020-10-25 ENCOUNTER — Encounter: Payer: Self-pay | Admitting: Podiatry

## 2020-10-30 LAB — AEROBIC/ANAEROBIC CULTURE W GRAM STAIN (SURGICAL/DEEP WOUND)

## 2020-11-27 ENCOUNTER — Other Ambulatory Visit
Admission: RE | Admit: 2020-11-27 | Discharge: 2020-11-27 | Disposition: A | Discharge status: Home / Self Care | Payer: Medicare Other | Attending: Otolaryngology | Admitting: Otolaryngology

## 2020-11-27 DIAGNOSIS — D485 Neoplasm of uncertain behavior of skin: Secondary | ICD-10-CM | POA: Diagnosis not present

## 2020-11-27 DIAGNOSIS — L910 Hypertrophic scar: Secondary | ICD-10-CM | POA: Diagnosis not present

## 2020-11-27 DIAGNOSIS — X58XXXA Exposure to other specified factors, initial encounter: Secondary | ICD-10-CM | POA: Diagnosis not present

## 2020-11-27 DIAGNOSIS — L570 Actinic keratosis: Secondary | ICD-10-CM | POA: Diagnosis not present

## 2020-11-27 DIAGNOSIS — T8189XA Other complications of procedures, not elsewhere classified, initial encounter: Secondary | ICD-10-CM | POA: Insufficient documentation

## 2020-11-27 DIAGNOSIS — L821 Other seborrheic keratosis: Secondary | ICD-10-CM | POA: Diagnosis not present

## 2020-11-27 LAB — TSH: TSH: 123.018 u[IU]/mL — ABNORMAL HIGH (ref 0.350–4.500)

## 2020-11-27 LAB — T4, FREE: Free T4: <0.25 ng/dL — ABNORMAL LOW (ref 0.61–1.12)

## 2020-11-28 LAB — T3, FREE: T3, Free: 0.9 pg/mL — ABNORMAL LOW (ref 2.0–4.4)

## 2020-11-30 DIAGNOSIS — E890 Postprocedural hypothyroidism: Secondary | ICD-10-CM | POA: Diagnosis not present

## 2020-11-30 DIAGNOSIS — M953 Acquired deformity of neck: Secondary | ICD-10-CM | POA: Diagnosis not present

## 2020-12-03 ENCOUNTER — Other Ambulatory Visit: Payer: Self-pay | Admitting: Family Medicine

## 2020-12-05 ENCOUNTER — Other Ambulatory Visit: Payer: Self-pay | Admitting: Family Medicine

## 2020-12-16 ENCOUNTER — Other Ambulatory Visit: Payer: Self-pay | Admitting: Family Medicine

## 2020-12-16 NOTE — Telephone Encounter (Signed)
Last RF 12/03/20 #90///requested too soon

## 2020-12-25 ENCOUNTER — Inpatient Hospital Stay: Payer: Medicare Other | Attending: Oncology

## 2020-12-25 ENCOUNTER — Other Ambulatory Visit: Payer: Self-pay

## 2020-12-25 DIAGNOSIS — Z8711 Personal history of peptic ulcer disease: Secondary | ICD-10-CM | POA: Diagnosis not present

## 2020-12-25 DIAGNOSIS — Z85820 Personal history of malignant melanoma of skin: Secondary | ICD-10-CM | POA: Insufficient documentation

## 2020-12-25 DIAGNOSIS — Z808 Family history of malignant neoplasm of other organs or systems: Secondary | ICD-10-CM | POA: Insufficient documentation

## 2020-12-25 DIAGNOSIS — Z9049 Acquired absence of other specified parts of digestive tract: Secondary | ICD-10-CM | POA: Diagnosis not present

## 2020-12-25 DIAGNOSIS — Z818 Family history of other mental and behavioral disorders: Secondary | ICD-10-CM | POA: Diagnosis not present

## 2020-12-25 DIAGNOSIS — Z86718 Personal history of other venous thrombosis and embolism: Secondary | ICD-10-CM | POA: Insufficient documentation

## 2020-12-25 DIAGNOSIS — Z882 Allergy status to sulfonamides status: Secondary | ICD-10-CM | POA: Insufficient documentation

## 2020-12-25 DIAGNOSIS — E890 Postprocedural hypothyroidism: Secondary | ICD-10-CM | POA: Diagnosis not present

## 2020-12-25 DIAGNOSIS — R5383 Other fatigue: Secondary | ICD-10-CM | POA: Insufficient documentation

## 2020-12-25 DIAGNOSIS — Z87442 Personal history of urinary calculi: Secondary | ICD-10-CM | POA: Insufficient documentation

## 2020-12-25 DIAGNOSIS — Z801 Family history of malignant neoplasm of trachea, bronchus and lung: Secondary | ICD-10-CM | POA: Insufficient documentation

## 2020-12-25 DIAGNOSIS — Z9884 Bariatric surgery status: Secondary | ICD-10-CM | POA: Insufficient documentation

## 2020-12-25 DIAGNOSIS — Z8249 Family history of ischemic heart disease and other diseases of the circulatory system: Secondary | ICD-10-CM | POA: Diagnosis not present

## 2020-12-25 DIAGNOSIS — Z87891 Personal history of nicotine dependence: Secondary | ICD-10-CM | POA: Diagnosis not present

## 2020-12-25 DIAGNOSIS — Z8349 Family history of other endocrine, nutritional and metabolic diseases: Secondary | ICD-10-CM | POA: Insufficient documentation

## 2020-12-25 DIAGNOSIS — Z79890 Hormone replacement therapy (postmenopausal): Secondary | ICD-10-CM | POA: Diagnosis not present

## 2020-12-25 DIAGNOSIS — D508 Other iron deficiency anemias: Secondary | ICD-10-CM | POA: Diagnosis not present

## 2020-12-25 DIAGNOSIS — Z833 Family history of diabetes mellitus: Secondary | ICD-10-CM | POA: Diagnosis not present

## 2020-12-25 DIAGNOSIS — I10 Essential (primary) hypertension: Secondary | ICD-10-CM | POA: Insufficient documentation

## 2020-12-25 DIAGNOSIS — Z811 Family history of alcohol abuse and dependence: Secondary | ICD-10-CM | POA: Insufficient documentation

## 2020-12-25 DIAGNOSIS — Z79899 Other long term (current) drug therapy: Secondary | ICD-10-CM | POA: Diagnosis not present

## 2020-12-25 DIAGNOSIS — M2042 Other hammer toe(s) (acquired), left foot: Secondary | ICD-10-CM | POA: Diagnosis not present

## 2020-12-25 LAB — CBC WITH DIFFERENTIAL/PLATELET
Abs Immature Granulocytes: 0.01 K/uL (ref 0.00–0.07)
Basophils Absolute: 0 K/uL (ref 0.0–0.1)
Basophils Relative: 0 %
Eosinophils Absolute: 0.1 K/uL (ref 0.0–0.5)
Eosinophils Relative: 1 %
HCT: 35 % — ABNORMAL LOW (ref 36.0–46.0)
Hemoglobin: 11.7 g/dL — ABNORMAL LOW (ref 12.0–15.0)
Immature Granulocytes: 0 %
Lymphocytes Relative: 38 %
Lymphs Abs: 1.9 K/uL (ref 0.7–4.0)
MCH: 34.1 pg — ABNORMAL HIGH (ref 26.0–34.0)
MCHC: 33.4 g/dL (ref 30.0–36.0)
MCV: 102 fL — ABNORMAL HIGH (ref 80.0–100.0)
Monocytes Absolute: 0.3 K/uL (ref 0.1–1.0)
Monocytes Relative: 7 %
Neutro Abs: 2.6 K/uL (ref 1.7–7.7)
Neutrophils Relative %: 54 %
Platelets: 172 K/uL (ref 150–400)
RBC: 3.43 MIL/uL — ABNORMAL LOW (ref 3.87–5.11)
RDW: 12.2 % (ref 11.5–15.5)
WBC: 4.9 K/uL (ref 4.0–10.5)
nRBC: 0 % (ref 0.0–0.2)

## 2020-12-25 LAB — COMPREHENSIVE METABOLIC PANEL
ALT: 24 U/L (ref 0–44)
AST: 25 U/L (ref 15–41)
Albumin: 3.4 g/dL — ABNORMAL LOW (ref 3.5–5.0)
Alkaline Phosphatase: 87 U/L (ref 38–126)
Anion gap: 8 (ref 5–15)
BUN: 15 mg/dL (ref 6–20)
CO2: 23 mmol/L (ref 22–32)
Calcium: 7.9 mg/dL — ABNORMAL LOW (ref 8.9–10.3)
Chloride: 102 mmol/L (ref 98–111)
Creatinine, Ser: 0.92 mg/dL (ref 0.44–1.00)
GFR, Estimated: >60 mL/min (ref >60)
Glucose, Bld: 111 mg/dL — ABNORMAL HIGH (ref 70–99)
Potassium: 4.4 mmol/L (ref 3.5–5.1)
Sodium: 133 mmol/L — ABNORMAL LOW (ref 135–145)
Total Bilirubin: 0.4 mg/dL (ref 0.3–1.2)
Total Protein: 6 g/dL — ABNORMAL LOW (ref 6.5–8.1)

## 2020-12-25 LAB — IRON AND TIBC
Iron: 57 ug/dL (ref 28–170)
Saturation Ratios: 15 % (ref 10.4–31.8)
TIBC: 375 ug/dL (ref 250–450)
UIBC: 318 ug/dL

## 2020-12-25 LAB — FERRITIN: Ferritin: 45 ng/mL (ref 11–307)

## 2020-12-26 ENCOUNTER — Encounter: Payer: Self-pay | Admitting: Oncology

## 2020-12-26 ENCOUNTER — Inpatient Hospital Stay (HOSPITAL_BASED_OUTPATIENT_CLINIC_OR_DEPARTMENT_OTHER): Payer: Medicare Other | Admitting: Oncology

## 2020-12-26 ENCOUNTER — Inpatient Hospital Stay: Payer: Medicare Other

## 2020-12-26 VITALS — BP 127/87 | HR 69 | Temp 98.0°F | Resp 18 | Wt 231.5 lb

## 2020-12-26 DIAGNOSIS — Z79899 Other long term (current) drug therapy: Secondary | ICD-10-CM | POA: Diagnosis not present

## 2020-12-26 DIAGNOSIS — Z85820 Personal history of malignant melanoma of skin: Secondary | ICD-10-CM | POA: Diagnosis not present

## 2020-12-26 DIAGNOSIS — Z8349 Family history of other endocrine, nutritional and metabolic diseases: Secondary | ICD-10-CM | POA: Diagnosis not present

## 2020-12-26 DIAGNOSIS — Z8711 Personal history of peptic ulcer disease: Secondary | ICD-10-CM | POA: Diagnosis not present

## 2020-12-26 DIAGNOSIS — Z86718 Personal history of other venous thrombosis and embolism: Secondary | ICD-10-CM | POA: Diagnosis not present

## 2020-12-26 DIAGNOSIS — Z808 Family history of malignant neoplasm of other organs or systems: Secondary | ICD-10-CM | POA: Diagnosis not present

## 2020-12-26 DIAGNOSIS — E890 Postprocedural hypothyroidism: Secondary | ICD-10-CM | POA: Diagnosis not present

## 2020-12-26 DIAGNOSIS — Z87442 Personal history of urinary calculi: Secondary | ICD-10-CM | POA: Diagnosis not present

## 2020-12-26 DIAGNOSIS — Z801 Family history of malignant neoplasm of trachea, bronchus and lung: Secondary | ICD-10-CM | POA: Diagnosis not present

## 2020-12-26 DIAGNOSIS — Z9049 Acquired absence of other specified parts of digestive tract: Secondary | ICD-10-CM | POA: Diagnosis not present

## 2020-12-26 DIAGNOSIS — Z87891 Personal history of nicotine dependence: Secondary | ICD-10-CM | POA: Diagnosis not present

## 2020-12-26 DIAGNOSIS — D508 Other iron deficiency anemias: Secondary | ICD-10-CM

## 2020-12-26 DIAGNOSIS — Z833 Family history of diabetes mellitus: Secondary | ICD-10-CM | POA: Diagnosis not present

## 2020-12-26 DIAGNOSIS — Z9884 Bariatric surgery status: Secondary | ICD-10-CM

## 2020-12-26 DIAGNOSIS — I10 Essential (primary) hypertension: Secondary | ICD-10-CM | POA: Diagnosis not present

## 2020-12-26 DIAGNOSIS — Z8249 Family history of ischemic heart disease and other diseases of the circulatory system: Secondary | ICD-10-CM | POA: Diagnosis not present

## 2020-12-26 DIAGNOSIS — Z882 Allergy status to sulfonamides status: Secondary | ICD-10-CM | POA: Diagnosis not present

## 2020-12-26 DIAGNOSIS — R5383 Other fatigue: Secondary | ICD-10-CM | POA: Diagnosis not present

## 2020-12-26 LAB — VITAMIN B12: Vitamin B-12: 1170 pg/mL — ABNORMAL HIGH (ref 180–914)

## 2020-12-26 NOTE — Progress Notes (Signed)
Pt here for follow up. She reports that she fellt small knot to left breast last week.

## 2020-12-26 NOTE — Progress Notes (Signed)
Hematology/Oncology progress note [facility] Main Telephone:([phone] [phone]   Patient Care Team: Virginia Crews, MD as PCP - General (Family Medicine) Minna Merritts, MD as PCP - Cardiology (Cardiology) Samara Deist, DPM as Referring Physician (Podiatry) Schnier, Dolores Lory, MD (Vascular Surgery) Hessie Knows, MD as Consulting Physician (Orthopedic Surgery) Earlie Server, MD as Consulting Physician (Hematology and Oncology)  REFERRING PROVIDER: Dr.Tahiliani CHIEF COMPLAINTS/REASON FOR VISIT:  iron deficiency anemia  HISTORY OF PRESENTING ILLNESS:  Claire Strong is a  60 y.o.  female with PMH listed below who was referred to me for evaluation of iron deficiency anemia Patient was recently referred to GI for evaluation of 3 months history of dysphagia with solid food.  Her previous labs showed low normal ferritin level at 15.Claire Strong Referred to me for further evaluation of iron deficiency.  She has a history of gastric bypass in 2006.  Previous colonoscopy showed hyperplastic polyps.   Associated signs and symptoms: Patient reports fatigue.  Denies SOB with exertion.  Denies weight loss, easy bruising, hematochezia, hemoptysis, hematuria. Context: History of gastric bypass History of iron deficiency: Yes, reports history of IV iron infusion.  Rectal bleeding: deneis Menstrual bleeding/ Vaginal bleeding : hysterotomy at age of 69  Hematemesis or hemoptysis : denies Last endoscopy:2006 Fatigue: Yes.  SOB: deneis   # Also history of DVT, after 1st pregnancy, history of positive lupus anticoagulant, per patient, repeat antibody panel was negative. Currently not on anticoagulation, on Aspirin.   History of basal cell carcinoma.   # EGD and colonoscopy on 07/01/2018 EGD shows reflux esophagitis.  No atypia or malignancy. Colonoscopy showed tubular adenoma.  Negative for high-grade dysplasia or malignancy. INTERVAL HISTORY MARKEETA Strong is a 60  y.o. female who has above history reviewed by me today presents for follow up visit for management of iron deficiency anemia,  Problems and complaints are listed below: Fatigue is chronic, not worse.  She felt a small knot on her left breast. She is on hormone replacement therapy.  Review of Systems  Constitutional:  Positive for fatigue. Negative for appetite change, chills and fever.  HENT:   Negative for hearing loss and voice change.   Eyes:  Negative for eye problems.  Respiratory:  Negative for chest tightness and cough.   Cardiovascular:  Negative for chest pain.  Gastrointestinal:  Negative for abdominal distention, abdominal pain and blood in stool.  Endocrine: Negative for hot flashes.  Genitourinary:  Negative for difficulty urinating and frequency.   Musculoskeletal:  Negative for arthralgias.  Skin:  Negative for itching and rash.  Neurological:  Negative for extremity weakness.  Hematological:  Negative for adenopathy. Does not bruise/bleed easily.  Psychiatric/Behavioral:  Negative for confusion.    MEDICAL HISTORY:  Past Medical History:  Diagnosis Date   Anxiety    Basal cell carcinoma    nose   Bleeding disorder (HCC)    Bronchial asthma    Cancer ([place])    melanoma; right knee   Cavovarus deformity of foot    bilateral, with L 5th bunionette (Dr. Gigi Gin ortho)   Chronic venous insufficiency 2007   s/p vein stripping and laser ablation   Complication of anesthesia    Depression    Diverticulosis    mild by colonoscopy   Headache    Migraines   History of DVT (deep vein thrombosis)    DVTs after 1st pregnancy, not on AC 2/2 bleeding ulcer, greenfield filter in place   History of  gastric ulcer 2009   History of kidney stones    Horseshoe kidney    sole, R kidney damage   HTN (hypertension)    Hx of degenerative disc disease    Hypothyroidism    Kidney stones    s/p renal hematoma after lithotripsy on right   Lupus anticoagulant disorder (HCC)     Multinodular goiter    s/p thyroidectomy   Nasal septal perforation    chronic, ENT rec avoid antihistamine, INS   Osteoarthritis    ?FM by rheum   Overweight    PONV (postoperative nausea and vomiting)    Post-surgical hypothyroidism    for multinodular goiter   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 Frutoso Chase   Seasonal allergies    allergy shots, singulair   Sight deterioration    disc in back   Sleep apnea    lost weight   Tachycardia     SURGICAL HISTORY: Past Surgical History:  Procedure Laterality Date   ABDOMINAL HERNIA REPAIR     ABDOMINAL HYSTERECTOMY     ANKLE FUSION Left 05/2013   APPENDECTOMY  1978   BACK SURGERY  2009   CESAREAN SECTION     X 2   CHOLECYSTECTOMY  1998   COLONOSCOPY  05/2012   hyperplastic polyps x2, mild diverticulosis Deatra Ina) rpt 10 yrs   COLONOSCOPY WITH PROPOFOL N/A 07/01/2018   Procedure: COLONOSCOPY WITH PROPOFOL;  Surgeon: Virgel Manifold, MD;  Location: [facility];  Service: Endoscopy;  Laterality: N/A;   CYST EXCISION     back  and shoulder   ESI Right 10/2015, 02/2016   C4/5 then C5/6 ESI (Ch[place])   ESI Bilateral 06/2015, 08/2015, 03/2016, 09/2017   L4/5 transforaminal ESI (Chasnis)   ESOPHAGOGASTRODUODENOSCOPY (EGD) WITH PROPOFOL N/A 07/01/2018   Procedure: ESOPHAGOGASTRODUODENOSCOPY (EGD) WITH PROPOFOL;  Surgeon: Virgel Manifold, MD;  Location: [facility];  Service: Endoscopy;  Laterality: N/A;   FL HIP INJECTION ([place] HX) Left 11/2016   Dr Sharlet Salina   FUSION OF TALONAVICULAR JOINT Left 09/12/2016   Procedure: FUSION OF TALONAVICULAR JOINT WITH TRINITY BONE GRAFT;  Surgeon: Samara Deist, DPM;  Location: [facility];  Service: Podiatry;  Laterality: Left;   GASTRIC BYPASS  05/21/05   Dr. Frutoso Chase, Roux-en-Y   Greenfield filter removal  2015   removed 6 wks after surgery   HAMMER TOE SURGERY Left 10/03/2020   Procedure: HAMMER TOE CORRECTION 2,3,4 AND Z - PLASTY LEFT;  Surgeon: Samara Deist, DPM;  Location: [place];  Service: Podiatry;  Laterality: Left;   hammerhead toes Left    HERNIA REPAIR     X 3   INCISION AND DRAINAGE OF WOUND Left 10/24/2020   Procedure: IRRIGATION AND DEBRIDEMENT; BELOW FASCIA FOOT; MULTIPLE;  Surgeon: Samara Deist, DPM;  Location: [place];  Service: Podiatry;  Laterality: Left;  IV with Local   IVC FILTER INSERTION N/A 09/09/2016   Procedure: IVC Filter Insertion;  Surgeon: Katha Cabal, MD;  Location: [place] [facility];  Service: Cardiovascular;  Laterality: N/A;   IVC FILTER REMOVAL N/A 11/25/2016   Procedure: IVC Filter Removal;  Surgeon: Katha Cabal, MD;  Location: [place] [facility];  Service: Cardiovascular;  Laterality: N/A;   JOINT REPLACEMENT     lyphoma excision  08/2015   arm   NASAL SEPTUM SURGERY  1981   deviated septum, repaired at [place]   ovaries remain   SCAR REVISION N/A 08/02/2020  Procedure: ADVANCEMENT FLAP SCAR REVISION;  Surgeon: Margaretha Sheffield, MD;  Location: [place];  Service: ENT;  Laterality: N/A;   SKIN SURGERY     basel cell   [place] Right 11/06/2015   Procedure: TOTAL HIP ARTHROPLASTY ANTERIOR APPROACH;  Surgeon: Hessie Knows, MD;  Location: [facility];  Service: Orthopedics;  Laterality: Right;   TOTAL HIP ARTHROPLASTY Left 03/19/2017   Procedure: TOTAL HIP ARTHROPLASTY ANTERIOR APPROACH;  Surgeon: Hessie Knows, MD;  Location: [facility];  Service: Orthopedics;  Laterality: Left;   TOTAL KNEE ARTHROPLASTY Left 08/2013   TOTAL THYROIDECTOMY  09/2011   benign path (done for multinodular goiter concern for cancer)   WEIL OSTEOTOMY Left 10/03/2020   Procedure: WEIL OSTEOTOMY 3RD LEFT;  Surgeon: Samara Deist, DPM;  Location: [place];  Service: Podiatry;  Laterality: Left;    SOCIAL HISTORY: Social History   Socioeconomic History   Marital status: Married    Spouse name: Gwyndolyn Saxon   Number of children: 2   Years of  education: 12   Highest education level: High school graduate  Occupational History   Occupation: stay at home  Tobacco Use   Smoking status: Former    Packs/day: 0.25    Pack years: 0.00    Types: Cigarettes    Quit date: 06/16/2004    Years since quitting: 16.5   Smokeless tobacco: Never   Tobacco comments:    Socially-no longer  Vaping Use   Vaping Use: Never used  Substance and Sexual Activity   Alcohol use: Yes    Alcohol/week: 0.0 standard drinks    Comment: Occasionally-beer or bloody mary monthly   Drug use: No   Sexual activity: Yes    Partners: Male    Birth control/protection: Surgical  Other Topics Concern   Not on file  Social History Narrative   Not on file   Social Determinants of Health   Financial Resource Strain: Low Risk    Difficulty of Paying Living Expenses: Not hard at all  Food Insecurity: No Food Insecurity   Worried About Charity fundraiser in the Last Year: Never true   [place] in the Last Year: Never true  Transportation Needs: No Transportation Needs   Lack of Transportation (Medical): No   Lack of Transportation (Non-Medical): No  Physical Activity: Inactive   Days of Exercise per Week: 0 days   Minutes of Exercise per Session: 0 min  Stress: Stress Concern Present   Feeling of Stress : To some extent  Social Connections: Moderately Isolated   Frequency of Communication with Friends and Family: More than three times a week   Frequency of Social Gatherings with Friends and Family: More than three times a week   Attends Religious Services: Never   Marine scientist or Organizations: No   Attends Music therapist: Never   Marital Status: Married  Human resources officer Violence: Not At Risk   Fear of Current or Ex-Partner: No   Emotionally Abused: No   Physically Abused: No   Sexually Abused: No    FAMILY HISTORY: Family History  Problem Relation Age of Onset   Diabetes Mother        prediabetes   Alzheimer's  disease Mother    Hyperlipidemia Mother    Diabetes Father    Coronary artery disease Father 14       CABG   Alcohol abuse Father    Lung cancer Father  Brain cancer Maternal Uncle    Diabetes Paternal Grandmother    Lung cancer Brother    Alzheimer's disease Brother    Diabetes Maternal Grandmother    Stroke Neg Hx    Colon cancer Neg Hx    Stomach cancer Neg Hx    Kidney disease Neg Hx    Breast cancer Neg Hx    Kidney cancer Neg Hx    Bladder Cancer Neg Hx    Ovarian cancer Neg Hx     ALLERGIES:  is allergic to elemental sulfur, sulfa antibiotics, cefuroxime axetil, nutritional supplements, other, oxycodone-acetaminophen, betadine [povidone iodine], and latex.  MEDICATIONS:  Current Outpatient Medications  Medication Sig Dispense Refill   acetaminophen (TYLENOL) 500 MG tablet Take 1,000 mg by mouth daily at 8 pm.     albuterol (VENTOLIN HFA) 108 (90 Base) MCG/ACT inhaler Inhale 2 puffs into the lungs every 6 (six) hours as needed for wheezing or shortness of breath. 6.7 g 0   Ascorbic Acid (VITAMIN C) 100 MG tablet Take by mouth daily. Unsure dose     aspirin 81 MG tablet Take 81 mg by mouth daily.     benazepril (LOTENSIN) 10 MG tablet Take 1 tablet (10 mg total) by mouth daily. 90 tablet 3   bisacodyl (DULCOLAX) 5 MG EC tablet Take 5 mg by mouth daily.     buPROPion (WELLBUTRIN SR) 150 MG 12 hr tablet Take 1 tablet by mouth twice daily 180 tablet 0   calcium carbonate (CALCIUM 600) 600 MG TABS tablet Take 1 tablet (600 mg total) 2 (two) times daily with a meal by mouth. 60 tablet    cetirizine (ZYRTEC) 10 MG tablet Take 10 mg by mouth daily.     Cholecalciferol (VITAMIN D3) 1000 UNITS tablet Take 2,000 Units by mouth daily.     citalopram (CELEXA) 40 MG tablet Take 1 tablet by mouth once daily 90 tablet 0   cyclobenzaprine (FLEXERIL) 10 MG tablet Take 10 mg by mouth 3 (three) times daily as needed.      EPINEPHrine (EPI-PEN) 0.3 mg/0.3 mL DEVI Inject 0.3 mLs (0.3 mg  total) into the muscle once. 1 Device 2   estradiol (ESTRACE) 2 MG tablet Take 1 tablet by mouth daily.   0   Fluticasone-Salmeterol (ADVAIR DISKUS) 100-50 MCG/DOSE AEPB Inhale 1 puff into the lungs 2 (two) times daily. 60 each 5   furosemide (LASIX) 40 MG tablet Take 1 tablet (40 mg total) by mouth daily. 90 tablet 1   levothyroxine (SYNTHROID) 150 MCG tablet Take 175 mcg by mouth daily before breakfast.     liothyronine (CYTOMEL) 5 MCG tablet Take 5 mcg by mouth daily.      metoprolol tartrate (LOPRESSOR) 25 MG tablet TAKE 1 TABLET BY MOUTH TWICE DAILY. 180 tablet 3   montelukast (SINGULAIR) 10 MG tablet Take 10 mg by mouth every morning.     ondansetron (ZOFRAN) 4 MG tablet Take 1 tablet (4 mg total) by mouth every 8 (eight) hours as needed for nausea or vomiting. 30 tablet 0   oxyCODONE-acetaminophen (PERCOCET) 5-325 MG tablet Take 1-2 tablets by mouth every 6 (six) hours as needed for severe pain. Max 6 tabs per day 30 tablet 0   pantoprazole (PROTONIX) 20 MG tablet Take 1 tablet by mouth twice daily 60 tablet 2   Potassium 95 MG TABS Take 1 tablet by mouth daily.     solifenacin (VESICARE) 5 MG tablet Take 5 mg by mouth daily.  SUMAtriptan (IMITREX) 50 MG tablet Take 50 mg by mouth every 2 (two) hours as needed for migraine. May repeat in 2 hours if headache persists or recurs.     vitamin B-12 (CYANOCOBALAMIN) 500 MCG tablet Take 1 tablet (500 mcg total) by mouth daily. 90 tablet 1   zinc gluconate 50 MG tablet Take 50 mg by mouth daily.     ondansetron (ZOFRAN) 4 MG tablet Take 1 tablet (4 mg total) by mouth daily as needed for nausea or vomiting. (Patient not taking: Reported on 12/26/2020) 30 tablet 1   No current facility-administered medications for this visit.     PHYSICAL EXAMINATION: ECOG PERFORMANCE STATUS: 1 - Symptomatic but completely ambulatory Vitals:   12/26/20 1311  BP: 127/87  Pulse: 69  Resp: 18  Temp: 98 F (36.7 C)   Filed Weights   12/26/20 1311   Weight: 231 lb 8 oz (105 kg)    Physical Exam Constitutional:      General: She is not in acute distress. HENT:     Head: Normocephalic and atraumatic.  Eyes:     General: No scleral icterus.    Pupils: Pupils are equal, round, and reactive to light.  Cardiovascular:     Rate and Rhythm: Normal rate and regular rhythm.     Heart sounds: Normal heart sounds.  Pulmonary:     Effort: Pulmonary effort is normal. No respiratory distress.     Breath sounds: No wheezing.  Abdominal:     General: Bowel sounds are normal. There is no distension.     Palpations: Abdomen is soft.  Musculoskeletal:        General: No deformity. Normal range of motion.     Cervical back: Normal range of motion and neck supple.  Skin:    General: Skin is warm and dry.     Findings: No erythema or rash.  Neurological:     General: No focal deficit present.     Mental Status: She is alert. Mental status is at baseline.     Cranial Nerves: No cranial nerve deficit.     Coordination: Coordination normal.   Breast exam was performed. I did not palpate any discrete mass in her left breast. She could not located the knot as well today.  LABORATORY DATA:  I have reviewed the data as listed Lab Results  Component Value Date   WBC 4.9 12/25/2020   HGB 11.7 (L) 12/25/2020   HCT 35.0 (L) 12/25/2020   MCV 102.0 (H) 12/25/2020   PLT 172 12/25/2020   Recent Labs    01/23/20 0828 01/23/20 0828 06/18/20 1254 07/17/20 1320 09/06/20 1553 12/25/20 1305  NA 141   < > 136 135 135 133*  K 4.6  --  3.3* 3.2* 3.2* 4.4  CL 103  --  102 100 96 102  CO2 24  --  24 26 21 23   GLUCOSE 73   < > 88 88 84 111*  BUN 14   < > 20 22* 19 15  CREATININE 1.00  --  0.91 0.95 1.10* 0.92  CALCIUM 8.2*  --  7.7* 8.1* 8.8 7.9*  GFRNONAA 62  --  >60 >60  --  >60  GFRAA 72  --   --   --   --   --   PROT 5.8*  --  6.0*  --   --  6.0*  ALBUMIN 3.8  --  3.5  --   --  3.4*  AST 21  --  24  --   --  25  ALT 18  --  21  --   --  24   ALKPHOS 102  --  111  --   --  87  BILITOT 0.4  --  0.5  --   --  0.4   < > = values in this interval not displayed.    Iron/TIBC/Ferritin/ %Sat    Component Value Date/Time   IRON 57 12/25/2020 1305   IRON 54 02/03/2018 1033   TIBC 375 12/25/2020 1305   TIBC 358 02/03/2018 1033   FERRITIN 45 12/25/2020 1305   FERRITIN 16 02/03/2018 1033   IRONPCTSAT 15 12/25/2020 1305   IRONPCTSAT 15 02/03/2018 1033      ASSESSMENT & PLAN:  1. Other iron deficiency anemia   2. History of gastric bypass   3. Hypocalcemia    #History of iron deficiency anemia, history of gastric bypass Labs are reviewed and discussed with patient. Iron panel has improved comparing to last visit.  Hold of IV feraheme.   #Vitamin B12 level is adequate. continue weekly vitamin B12 1000 MCG.  I will repeat B12 in the next visit.  #Hypocalcemia Calcium level is at 7.9, continue calcium 1500mg  dialy.   #On hormone replacement therapy-she has no family history of breast cancer.   No discrete mass felt in her left breast. Recommend patient to get annual mammogram surveillance.   No orders of the defined types were placed in this encounter.   All questions were answered. The patient knows to call the clinic with any problems questions or concerns.  Return of visit:   Labs (cbc,iron,ferr) in 6 months/ NP & poss feraheme 1-2 days after labs  Labs (cbc,iron,ferr) in 12 mths/ MD & poss feraheme 1-2 days after labs  We spent sufficient time to discuss many aspect of care, questions were answered to patient's satisfaction.  Earlie Server, MD, PhD Hematology Oncology [place] at [facility]  12/26/2020

## 2020-12-27 ENCOUNTER — Other Ambulatory Visit: Payer: Self-pay | Admitting: Family Medicine

## 2020-12-27 DIAGNOSIS — L570 Actinic keratosis: Secondary | ICD-10-CM | POA: Diagnosis not present

## 2021-01-30 DIAGNOSIS — L821 Other seborrheic keratosis: Secondary | ICD-10-CM | POA: Diagnosis not present

## 2021-01-30 DIAGNOSIS — Z85828 Personal history of other malignant neoplasm of skin: Secondary | ICD-10-CM | POA: Diagnosis not present

## 2021-01-30 DIAGNOSIS — D225 Melanocytic nevi of trunk: Secondary | ICD-10-CM | POA: Diagnosis not present

## 2021-01-30 DIAGNOSIS — Z859 Personal history of malignant neoplasm, unspecified: Secondary | ICD-10-CM | POA: Diagnosis not present

## 2021-01-30 DIAGNOSIS — L570 Actinic keratosis: Secondary | ICD-10-CM | POA: Diagnosis not present

## 2021-01-31 ENCOUNTER — Other Ambulatory Visit: Payer: Self-pay

## 2021-01-31 ENCOUNTER — Other Ambulatory Visit (HOSPITAL_COMMUNITY): Payer: Self-pay | Admitting: Podiatry

## 2021-01-31 ENCOUNTER — Ambulatory Visit
Admission: RE | Admit: 2021-01-31 | Discharge: 2021-01-31 | Disposition: A | Discharge status: Home / Self Care | Payer: Medicare Other | Source: Ambulatory Visit | Attending: Podiatry | Admitting: Podiatry

## 2021-01-31 ENCOUNTER — Other Ambulatory Visit: Payer: Self-pay | Admitting: Podiatry

## 2021-01-31 DIAGNOSIS — M25572 Pain in left ankle and joints of left foot: Secondary | ICD-10-CM | POA: Insufficient documentation

## 2021-01-31 DIAGNOSIS — R600 Localized edema: Secondary | ICD-10-CM | POA: Diagnosis not present

## 2021-01-31 DIAGNOSIS — M7989 Other specified soft tissue disorders: Secondary | ICD-10-CM | POA: Diagnosis not present

## 2021-01-31 DIAGNOSIS — Z86718 Personal history of other venous thrombosis and embolism: Secondary | ICD-10-CM | POA: Diagnosis not present

## 2021-01-31 DIAGNOSIS — N182 Chronic kidney disease, stage 2 (mild): Secondary | ICD-10-CM | POA: Diagnosis not present

## 2021-01-31 DIAGNOSIS — M79605 Pain in left leg: Secondary | ICD-10-CM | POA: Diagnosis not present

## 2021-02-01 ENCOUNTER — Encounter: Payer: Self-pay | Admitting: Family Medicine

## 2021-03-05 DIAGNOSIS — E890 Postprocedural hypothyroidism: Secondary | ICD-10-CM | POA: Diagnosis not present

## 2021-03-06 ENCOUNTER — Other Ambulatory Visit: Payer: Self-pay | Admitting: Family Medicine

## 2021-03-06 NOTE — Telephone Encounter (Signed)
Pt has appt 04/02/21

## 2021-03-18 ENCOUNTER — Ambulatory Visit: Payer: Self-pay | Admitting: Family Medicine

## 2021-03-21 ENCOUNTER — Other Ambulatory Visit: Payer: Self-pay | Admitting: Family Medicine

## 2021-03-21 ENCOUNTER — Telehealth: Payer: Self-pay | Admitting: Gastroenterology

## 2021-03-21 NOTE — Telephone Encounter (Signed)
Patient needs clarification on when to get her next Colonoscopy.. It's recall in for 40yrs , also in her chart it says next Colonoscopy due in 10 yrs. Clinical staff will follow up with patient.

## 2021-03-21 NOTE — Telephone Encounter (Signed)
Pt. Ready to schedule 3 year colonoscopy

## 2021-03-21 NOTE — Telephone Encounter (Signed)
Pt is currently on recall and not due until after 06/2021... pt will need to call beginning of January 2023 to schedule

## 2021-03-25 ENCOUNTER — Telehealth: Payer: Self-pay

## 2021-03-25 ENCOUNTER — Other Ambulatory Visit: Payer: Self-pay | Admitting: Family Medicine

## 2021-03-25 DIAGNOSIS — Z1231 Encounter for screening mammogram for malignant neoplasm of breast: Secondary | ICD-10-CM

## 2021-03-25 NOTE — Addendum Note (Signed)
Addended by: Shawna Orleans on: 03/25/2021 09:48 AM   Modules accepted: Orders

## 2021-03-25 NOTE — Telephone Encounter (Signed)
Left detailed message for patient to call [place] to schedule appt.

## 2021-03-25 NOTE — Telephone Encounter (Signed)
Copied from [place] [phone]. Topic: Referral - Request for Referral >> Mar 25, 2021  7:54 AM Scherrie Gerlach wrote: Has patient seen PCP for this complaint? Yes.   *If NO, is insurance requiring patient see PCP for this issue before PCP can refer them? Referral for which specialty: mammogram  Preferred provider/office: [facility])

## 2021-03-26 NOTE — Telephone Encounter (Signed)
Requested medication (s) are due for refill today Yes  Requested medication (s) are on the active medication list Yes  Future visit scheduled No  Note to office-the order for this on 03/22/21 was not received by pharmacy. According to the chart the patient has cancelled upcoming appointment as well as her last appointment that was scheduled recently. LOV 09/06/20. Routing to physician for review.    Requested Prescriptions  Pending Prescriptions Disp Refills   furosemide (LASIX) 40 MG tablet [Pharmacy Med Name: Furosemide 40 MG Oral Tablet] 90 tablet 0    Sig: Take 1 tablet by mouth once daily     Cardiovascular:  Diuretics - Loop Failed - 03/25/2021  6:55 AM      Failed - Ca in normal range and within 360 days    Calcium  Date Value Ref Range Status  12/25/2020 7.9 (L) 8.9 - 10.3 mg/dL Final  09/20/2011 7.7 mg/dL Final   Calcium, Total  Date Value Ref Range Status  08/27/2013 7.4 (L) 8.5 - 10.1 mg/dL Final          Failed - Na in normal range and within 360 days    Sodium  Date Value Ref Range Status  12/25/2020 133 (L) 135 - 145 mmol/L Final  09/06/2020 135 134 - 144 mmol/L Final  08/27/2013 132 (L) 136 - 145 mmol/L Final          Failed - Valid encounter within last 6 months    Recent Outpatient Visits           6 months ago Atrial tachycardia [facility])   [facility] [place], Dionne Bucy, MD   11 months ago Need for influenza vaccination   [facility], Dionne Bucy, MD   1 year ago Mild intermittent asthma with acute exacerbation   [facility] [place], Adriana M, PA-C   1 year ago Mild intermittent asthma with acute exacerbation   [facility] [place], Dionne Bucy, MD   1 year ago Essential hypertension   [place], Dionne Bucy, MD       Future Appointments             In 1 week Bacigalupo, Dionne Bucy, MD [facility]            Passed - K in normal  range and within 360 days    Potassium  Date Value Ref Range Status  12/25/2020 4.4 3.5 - 5.1 mmol/L Final  08/27/2013 4.5 3.5 - 5.1 mmol/L Final          Passed - Cr in normal range and within 360 days    Creatinine  Date Value Ref Range Status  08/27/2013 0.91 0.60 - 1.30 mg/dL Final   Creat  Date Value Ref Range Status  09/20/2011 1.02  Final   Creatinine, Ser  Date Value Ref Range Status  12/25/2020 0.92 0.44 - 1.00 mg/dL Final   Creatinine,U  Date Value Ref Range Status  09/17/2012 42.3 mg/dL Final          Passed - Last BP in normal range    BP Readings from Last 1 Encounters:  12/26/20 127/87

## 2021-04-02 ENCOUNTER — Other Ambulatory Visit: Payer: Self-pay

## 2021-04-02 ENCOUNTER — Ambulatory Visit (INDEPENDENT_AMBULATORY_CARE_PROVIDER_SITE_OTHER): Payer: Medicare Other | Admitting: Family Medicine

## 2021-04-02 ENCOUNTER — Encounter: Payer: Self-pay | Admitting: Family Medicine

## 2021-04-02 DIAGNOSIS — E8881 Metabolic syndrome: Secondary | ICD-10-CM

## 2021-04-02 DIAGNOSIS — Z9884 Bariatric surgery status: Secondary | ICD-10-CM

## 2021-04-02 MED ORDER — PHENTERMINE HCL 37.5 MG PO CAPS: 37.5000 mg | ORAL_CAPSULE | ORAL | 2 refills | Status: DC

## 2021-04-02 NOTE — Assessment & Plan Note (Signed)
-   BMI >35  and associated with HTN, PAD, Metabolic syndrome Discussed importance of healthy weight management Discussed diet and exercise  Discussed options for medications Will start with phentermine x36m  Discussed risks and benefits Consider Ozempic in the future

## 2021-04-02 NOTE — Progress Notes (Signed)
Established patient visit   Patient: Claire Strong   DOB: [birth year]   60 y.o. Female  MRN: [id number] Visit Date: 04/02/2021  Today's healthcare provider: Lavon Paganini, MD   Chief Complaint  Patient presents with   Obesity    Subjective    HPI  Patient here today C/O weight gain, and unable to lose weight. Patient reports eating more snacks. She reports she has tried diets , however reports that it only last a few days or weeks before stopping. She reports highest weight has been 250lb. Patient reports she is unable to exercise due to back pain. Patient had gastric bypass in 2002.   Wt Readings from Last 3 Encounters:  04/02/21 234 lb 3.2 oz (106.2 kg)  12/26/20 231 lb 8 oz (105 kg)  10/24/20 226 lb (102.5 kg)       Medications: Outpatient Medications Prior to Visit  Medication Sig   acetaminophen (TYLENOL) 500 MG tablet Take 1,000 mg by mouth daily at 8 pm.   albuterol (VENTOLIN HFA) 108 (90 Base) MCG/ACT inhaler Inhale 2 puffs into the lungs every 6 (six) hours as needed for wheezing or shortness of breath.   Ascorbic Acid (VITAMIN C) 100 MG tablet Take by mouth daily. Unsure dose   aspirin 81 MG tablet Take 81 mg by mouth daily.   benazepril (LOTENSIN) 10 MG tablet Take 1 tablet (10 mg total) by mouth daily.   bisacodyl (DULCOLAX) 5 MG EC tablet Take 5 mg by mouth daily.   buPROPion (WELLBUTRIN SR) 150 MG 12 hr tablet Take 1 tablet by mouth twice daily   calcium carbonate (CALCIUM 600) 600 MG TABS tablet Take 1 tablet (600 mg total) 2 (two) times daily with a meal by mouth.   cetirizine (ZYRTEC) 10 MG tablet Take 10 mg by mouth daily.   Cholecalciferol (VITAMIN D3) 1000 UNITS tablet Take 2,000 Units by mouth daily.   citalopram (CELEXA) 40 MG tablet Take 1 tablet by mouth once daily   cyclobenzaprine (FLEXERIL) 10 MG tablet Take 10 mg by mouth 3 (three) times daily as needed.    EPINEPHrine (EPI-PEN) 0.3 mg/0.3 mL DEVI Inject 0.3 mLs (0.3 mg total) into the  muscle once.   estradiol (ESTRACE) 2 MG tablet Take 1 tablet by mouth daily.    Fluticasone-Salmeterol (ADVAIR DISKUS) 100-50 MCG/DOSE AEPB Inhale 1 puff into the lungs 2 (two) times daily.   furosemide (LASIX) 40 MG tablet Take 1 tablet by mouth once daily   metoprolol tartrate (LOPRESSOR) 25 MG tablet TAKE 1 TABLET BY MOUTH TWICE DAILY.   montelukast (SINGULAIR) 10 MG tablet Take 10 mg by mouth every morning.   ondansetron (ZOFRAN) 4 MG tablet Take 1 tablet (4 mg total) by mouth every 8 (eight) hours as needed for nausea or vomiting.   ondansetron (ZOFRAN) 4 MG tablet Take 1 tablet (4 mg total) by mouth daily as needed for nausea or vomiting.   oxyCODONE-acetaminophen (PERCOCET) 5-325 MG tablet Take 1-2 tablets by mouth every 6 (six) hours as needed for severe pain. Max 6 tabs per day   pantoprazole (PROTONIX) 20 MG tablet Take 1 tablet by mouth twice daily   Potassium 95 MG TABS Take 1 tablet by mouth daily.   solifenacin (VESICARE) 5 MG tablet Take 5 mg by mouth daily.    SUMAtriptan (IMITREX) 50 MG tablet Take 50 mg by mouth every 2 (two) hours as needed for migraine. May repeat in 2 hours if headache persists or recurs.  vitamin B-12 (CYANOCOBALAMIN) 500 MCG tablet Take 1 tablet (500 mcg total) by mouth daily.   zinc gluconate 50 MG tablet Take 50 mg by mouth daily.   [DISCONTINUED] levothyroxine (SYNTHROID) 150 MCG tablet Take 175 mcg by mouth daily before breakfast.   [DISCONTINUED] liothyronine (CYTOMEL) 5 MCG tablet Take 5 mcg by mouth daily.    levothyroxine (SYNTHROID) 175 MCG tablet Take 175 mcg by mouth daily.   No facility-administered medications prior to visit.    Review of Systems  Constitutional:  Positive for appetite change, fatigue and unexpected weight change.  Cardiovascular:  Negative for chest pain and palpitations.  Gastrointestinal:  Negative for abdominal pain, constipation, diarrhea, nausea and vomiting.  Musculoskeletal:  Positive for back pain and myalgias.   Psychiatric/Behavioral:  Positive for agitation, decreased concentration, dysphoric mood and sleep disturbance. The patient is nervous/anxious.    Last CBC Lab Results  Component Value Date   WBC 4.9 12/25/2020   HGB 11.7 (L) 12/25/2020   HCT 35.0 (L) 12/25/2020   MCV 102.0 (H) 12/25/2020   MCH 34.1 (H) 12/25/2020   RDW 12.2 12/25/2020   PLT 172 82/64/1583   Last metabolic panel Lab Results  Component Value Date   GLUCOSE 111 (H) 12/25/2020   NA 133 (L) 12/25/2020   K 4.4 12/25/2020   CL 102 12/25/2020   CO2 23 12/25/2020   BUN 15 12/25/2020   CREATININE 0.92 12/25/2020   EGFR 58 (L) 09/06/2020   GFRNONAA >60 12/25/2020   CALCIUM 7.9 (L) 12/25/2020   PHOS 3.3 05/06/2018   PROT 6.0 (L) 12/25/2020   ALBUMIN 3.4 (L) 12/25/2020   LABGLOB 2.0 01/23/2020   AGRATIO 1.9 01/23/2020   BILITOT 0.4 12/25/2020   ALKPHOS 87 12/25/2020   AST 25 12/25/2020   ALT 24 12/25/2020   ANIONGAP 8 12/25/2020   Last lipids Lab Results  Component Value Date   CHOL 168 01/23/2020   HDL 52 01/23/2020   LDLCALC 88 01/23/2020   TRIG 166 (H) 01/23/2020   CHOLHDL 3.2 01/23/2020       Objective    BP 100/70 (BP Location: Left Arm, Patient Position: Sitting, Cuff Size: Large)   Pulse 68   Temp (!) 97 F (36.1 C) (Temporal)   Resp 16   Ht $R'5\' 8"'ff$  (1.727 m)   Wt 234 lb 3.2 oz (106.2 kg)   SpO2 98%   BMI 35.61 kg/m  BP Readings from Last 3 Encounters:  04/02/21 100/70  12/26/20 127/87  10/24/20 101/66   Wt Readings from Last 3 Encounters:  04/02/21 234 lb 3.2 oz (106.2 kg)  12/26/20 231 lb 8 oz (105 kg)  10/24/20 226 lb (102.5 kg)      Physical Exam Vitals reviewed.  Constitutional:      General: She is not in acute distress.    Appearance: Normal appearance. She is well-developed. She is not diaphoretic.  HENT:     Head: Normocephalic and atraumatic.  Eyes:     General: No scleral icterus.    Conjunctiva/sclera: Conjunctivae normal.  Neck:     Thyroid: No thyromegaly.   Cardiovascular:     Rate and Rhythm: Normal rate and regular rhythm.     Pulses: Normal pulses.     Heart sounds: Normal heart sounds. No murmur heard. Pulmonary:     Effort: Pulmonary effort is normal. No respiratory distress.     Breath sounds: Normal breath sounds. No wheezing, rhonchi or rales.  Musculoskeletal:     Cervical back: Neck  supple.     Right lower leg: No edema.     Left lower leg: No edema.  Lymphadenopathy:     Cervical: No cervical adenopathy.  Skin:    General: Skin is warm and dry.     Findings: No rash.  Neurological:     Mental Status: She is alert and oriented to person, place, and time. Mental status is at baseline.  Psychiatric:        Mood and Affect: Mood normal.        Behavior: Behavior normal.      No results found for any visits on 04/02/21.  Assessment & Plan     Problem List Items Addressed This Visit       Other   History of gastric bypass   Morbid obesity ([place]) - Primary     - BMI >35  and associated with HTN, PAD, Metabolic syndrome Discussed importance of healthy weight management Discussed diet and exercise  Discussed options for medications Will start with phentermine x64m  Discussed risks and benefits Consider Ozempic in the future      Relevant Medications   phentermine 37.5 MG capsule   Metabolic syndrome    Return in about 3 months (around 07/03/2021) for chronic disease f/u.      Total time spent on today's visit was greater than 30 minutes, including both face-to-face time and nonface-to-face time personally spent on review of chart (labs and imaging), discussing labs and goals, discussing further work-up, treatment options, referrals to specialist if needed, reviewing outside records of pertinent, answering patient's questions, and coordinating care.   I, Lavon Paganini, MD, have reviewed all documentation for this visit. The documentation on 04/02/21 for the exam, diagnosis, procedures, and orders are all accurate  and complete.   Makai Agostinelli, Dionne Bucy, MD, MPH [facility]

## 2021-04-02 NOTE — Patient Instructions (Signed)
Ozempic - see if insurance would cover it

## 2021-04-05 ENCOUNTER — Ambulatory Visit: Payer: Medicare Other | Admitting: Family Medicine

## 2021-04-24 ENCOUNTER — Other Ambulatory Visit: Payer: Self-pay

## 2021-04-24 ENCOUNTER — Ambulatory Visit
Admission: RE | Admit: 2021-04-24 | Discharge: 2021-04-24 | Disposition: A | Discharge status: Home / Self Care | Payer: Medicare Other | Source: Ambulatory Visit | Attending: Family Medicine | Admitting: Family Medicine

## 2021-04-24 DIAGNOSIS — Z1231 Encounter for screening mammogram for malignant neoplasm of breast: Secondary | ICD-10-CM | POA: Insufficient documentation

## 2021-04-26 DIAGNOSIS — E890 Postprocedural hypothyroidism: Secondary | ICD-10-CM | POA: Diagnosis not present

## 2021-05-08 ENCOUNTER — Encounter: Payer: Self-pay | Admitting: Family Medicine

## 2021-05-21 DIAGNOSIS — R234 Changes in skin texture: Secondary | ICD-10-CM | POA: Diagnosis not present

## 2021-05-21 DIAGNOSIS — M79672 Pain in left foot: Secondary | ICD-10-CM | POA: Diagnosis not present

## 2021-05-21 DIAGNOSIS — L853 Xerosis cutis: Secondary | ICD-10-CM | POA: Diagnosis not present

## 2021-06-17 ENCOUNTER — Other Ambulatory Visit: Payer: Self-pay | Admitting: Family Medicine

## 2021-06-23 ENCOUNTER — Encounter: Payer: Self-pay | Admitting: Family Medicine

## 2021-06-26 ENCOUNTER — Other Ambulatory Visit: Payer: Self-pay | Admitting: *Deleted

## 2021-06-26 DIAGNOSIS — D508 Other iron deficiency anemias: Secondary | ICD-10-CM

## 2021-06-28 ENCOUNTER — Other Ambulatory Visit: Payer: Self-pay | Admitting: Family Medicine

## 2021-06-28 DIAGNOSIS — E890 Postprocedural hypothyroidism: Secondary | ICD-10-CM | POA: Diagnosis not present

## 2021-06-28 NOTE — Telephone Encounter (Signed)
Requested medication (s) are due for refill today:   Provider to review  Requested medication (s) are on the active medication list:   Yes  Future visit scheduled:   Yes   Last ordered: 04/02/2021 #30, 2 refills  Non delegated refill   Requested Prescriptions  Pending Prescriptions Disp Refills   phentermine 37.5 MG capsule [Pharmacy Med Name: Phentermine HCl 37.5 MG Oral Capsule] 30 capsule 0    Sig: Take 1 capsule by mouth in the morning     Not Delegated - Gastroenterology:  Antiobesity Agents Failed - 06/28/2021  6:42 AM      Failed - This refill cannot be delegated      Passed - Last BP in normal range    BP Readings from Last 1 Encounters:  04/02/21 100/70          Passed - Last Heart Rate in normal range    Pulse Readings from Last 1 Encounters:  04/02/21 68          Passed - Valid encounter within last 12 months    Recent Outpatient Visits           2 months ago Morbid obesity [facility])   [facility] [place], Dionne Bucy, MD   9 months ago Atrial tachycardia [facility])   [facility], Dionne Bucy, MD   1 year ago Need for influenza vaccination   [facility], Dionne Bucy, MD   1 year ago Mild intermittent asthma with acute exacerbation   [facility], PA-C   1 year ago Mild intermittent asthma with acute exacerbation   [facility] [place], Dionne Bucy, MD       Future Appointments             In 6 days [place], Dionne Bucy, MD [facility] [place]

## 2021-07-01 ENCOUNTER — Inpatient Hospital Stay: Payer: Medicare Other | Attending: Oncology

## 2021-07-01 ENCOUNTER — Other Ambulatory Visit: Payer: Self-pay

## 2021-07-01 DIAGNOSIS — Z79890 Hormone replacement therapy (postmenopausal): Secondary | ICD-10-CM | POA: Diagnosis not present

## 2021-07-01 DIAGNOSIS — Z8249 Family history of ischemic heart disease and other diseases of the circulatory system: Secondary | ICD-10-CM | POA: Insufficient documentation

## 2021-07-01 DIAGNOSIS — I10 Essential (primary) hypertension: Secondary | ICD-10-CM | POA: Diagnosis not present

## 2021-07-01 DIAGNOSIS — Z882 Allergy status to sulfonamides status: Secondary | ICD-10-CM | POA: Insufficient documentation

## 2021-07-01 DIAGNOSIS — Z833 Family history of diabetes mellitus: Secondary | ICD-10-CM | POA: Diagnosis not present

## 2021-07-01 DIAGNOSIS — E890 Postprocedural hypothyroidism: Secondary | ICD-10-CM | POA: Diagnosis not present

## 2021-07-01 DIAGNOSIS — Z9884 Bariatric surgery status: Secondary | ICD-10-CM | POA: Insufficient documentation

## 2021-07-01 DIAGNOSIS — Z811 Family history of alcohol abuse and dependence: Secondary | ICD-10-CM | POA: Diagnosis not present

## 2021-07-01 DIAGNOSIS — Z86718 Personal history of other venous thrombosis and embolism: Secondary | ICD-10-CM | POA: Diagnosis not present

## 2021-07-01 DIAGNOSIS — D508 Other iron deficiency anemias: Secondary | ICD-10-CM | POA: Insufficient documentation

## 2021-07-01 DIAGNOSIS — Z801 Family history of malignant neoplasm of trachea, bronchus and lung: Secondary | ICD-10-CM | POA: Diagnosis not present

## 2021-07-01 DIAGNOSIS — Z85820 Personal history of malignant melanoma of skin: Secondary | ICD-10-CM | POA: Diagnosis not present

## 2021-07-01 DIAGNOSIS — Z9049 Acquired absence of other specified parts of digestive tract: Secondary | ICD-10-CM | POA: Insufficient documentation

## 2021-07-01 DIAGNOSIS — Z79899 Other long term (current) drug therapy: Secondary | ICD-10-CM | POA: Insufficient documentation

## 2021-07-01 DIAGNOSIS — Z87442 Personal history of urinary calculi: Secondary | ICD-10-CM | POA: Diagnosis not present

## 2021-07-01 DIAGNOSIS — R5383 Other fatigue: Secondary | ICD-10-CM | POA: Diagnosis not present

## 2021-07-01 DIAGNOSIS — Z8711 Personal history of peptic ulcer disease: Secondary | ICD-10-CM | POA: Diagnosis not present

## 2021-07-01 DIAGNOSIS — R531 Weakness: Secondary | ICD-10-CM | POA: Insufficient documentation

## 2021-07-01 DIAGNOSIS — Z87891 Personal history of nicotine dependence: Secondary | ICD-10-CM | POA: Insufficient documentation

## 2021-07-01 DIAGNOSIS — Z8349 Family history of other endocrine, nutritional and metabolic diseases: Secondary | ICD-10-CM | POA: Insufficient documentation

## 2021-07-01 DIAGNOSIS — Z818 Family history of other mental and behavioral disorders: Secondary | ICD-10-CM | POA: Insufficient documentation

## 2021-07-01 DIAGNOSIS — R1310 Dysphagia, unspecified: Secondary | ICD-10-CM | POA: Diagnosis not present

## 2021-07-01 DIAGNOSIS — E538 Deficiency of other specified B group vitamins: Secondary | ICD-10-CM | POA: Insufficient documentation

## 2021-07-01 LAB — CBC WITH DIFFERENTIAL/PLATELET
Abs Immature Granulocytes: 0.02 10*3/uL (ref 0.00–0.07)
Basophils Absolute: 0 10*3/uL (ref 0.0–0.1)
Basophils Relative: 1 %
Eosinophils Absolute: 0.1 10*3/uL (ref 0.0–0.5)
Eosinophils Relative: 2 %
HCT: 40.8 % (ref 36.0–46.0)
Hemoglobin: 13.7 g/dL (ref 12.0–15.0)
Immature Granulocytes: 0 %
Lymphocytes Relative: 36 %
Lymphs Abs: 2.2 10*3/uL (ref 0.7–4.0)
MCH: 29.8 pg (ref 26.0–34.0)
MCHC: 33.6 g/dL (ref 30.0–36.0)
MCV: 88.7 fL (ref 80.0–100.0)
Monocytes Absolute: 0.3 10*3/uL (ref 0.1–1.0)
Monocytes Relative: 5 %
Neutro Abs: 3.4 10*3/uL (ref 1.7–7.7)
Neutrophils Relative %: 56 %
Platelets: 242 10*3/uL (ref 150–400)
RBC: 4.6 MIL/uL (ref 3.87–5.11)
RDW: 11.8 % (ref 11.5–15.5)
WBC: 6 10*3/uL (ref 4.0–10.5)
nRBC: 0 % (ref 0.0–0.2)

## 2021-07-01 LAB — FERRITIN: Ferritin: 39 ng/mL (ref 11–307)

## 2021-07-01 LAB — IRON AND TIBC
Iron: 106 ug/dL (ref 28–170)
Saturation Ratios: 26 % (ref 10.4–31.8)
TIBC: 407 ug/dL (ref 250–450)
UIBC: 301 ug/dL

## 2021-07-02 DIAGNOSIS — E890 Postprocedural hypothyroidism: Secondary | ICD-10-CM | POA: Diagnosis not present

## 2021-07-02 NOTE — Progress Notes (Deleted)
Established patient visit   Patient: Claire Strong   DOB: 1961/02/03   61 y.o. Female  MRN: [id number] Visit Date: 07/04/2021  Today's healthcare provider: Lavon Paganini, MD   No chief complaint on file.  Subjective    HPI  Hypertension, follow-up Patient here for blood pressure follow up.  BP Readings from Last 3 Encounters:  04/02/21 100/70  12/26/20 127/87  10/24/20 101/66   Wt Readings from Last 3 Encounters:  04/02/21 234 lb 3.2 oz (106.2 kg)  12/26/20 231 lb 8 oz (105 kg)  10/24/20 226 lb (102.5 kg)      Outside blood pressures are {***enter patient reported home BP readings, or 'not being checked':1}. Symptoms: {Yes/No:20286} chest pain {Yes/No:20286} chest pressure  {Yes/No:20286} palpitations {Yes/No:20286} syncope  {Yes/No:20286} dyspnea {Yes/No:20286} orthopnea  {Yes/No:20286} paroxysmal nocturnal dyspnea {Yes/No:20286} lower extremity edema   Pertinent labs: Lab Results  Component Value Date   CHOL 168 01/23/2020   HDL 52 01/23/2020   LDLCALC 88 01/23/2020   TRIG 166 (H) 01/23/2020   CHOLHDL 3.2 01/23/2020   Lab Results  Component Value Date   NA 133 (L) 12/25/2020   K 4.4 12/25/2020   CREATININE 0.92 12/25/2020   GFRNONAA >60 12/25/2020   GLUCOSE 111 (H) 12/25/2020   TSH 123.018 (H) 11/27/2020     The 10-year ASCVD risk score (Arnett DK, et al., 2019) is: 2.2%   ---------------------------------------------------------------------------------------------------  Follow up for Obesity  The patient was last seen for this 3 months ago. Changes made at last visit include Discussed importance of healthy weight management, diet and exercise  Will start with phentermine x90m  Discussed risks and benefits Consider Ozempic in the future.  She reports {excellent/good/fair/poor:19665} compliance with treatment. She feels that condition is {improved/worse/unchanged:3041574}. She {is/is not:21021397} having side effects.  ***  -----------------------------------------------------------------------------------------   Medications: Outpatient Medications Prior to Visit  Medication Sig   acetaminophen (TYLENOL) 500 MG tablet Take 1,000 mg by mouth daily at 8 pm.   albuterol (VENTOLIN HFA) 108 (90 Base) MCG/ACT inhaler Inhale 2 puffs into the lungs every 6 (six) hours as needed for wheezing or shortness of breath.   Ascorbic Acid (VITAMIN C) 100 MG tablet Take by mouth daily. Unsure dose   aspirin 81 MG tablet Take 81 mg by mouth daily.   benazepril (LOTENSIN) 10 MG tablet Take 1 tablet (10 mg total) by mouth daily.   bisacodyl (DULCOLAX) 5 MG EC tablet Take 5 mg by mouth daily.   buPROPion (WELLBUTRIN SR) 150 MG 12 hr tablet Take 1 tablet by mouth twice daily   calcium carbonate (CALCIUM 600) 600 MG TABS tablet Take 1 tablet (600 mg total) 2 (two) times daily with a meal by mouth.   cetirizine (ZYRTEC) 10 MG tablet Take 10 mg by mouth daily.   Cholecalciferol (VITAMIN D3) 1000 UNITS tablet Take 2,000 Units by mouth daily.   citalopram (CELEXA) 40 MG tablet Take 1 tablet by mouth once daily   cyclobenzaprine (FLEXERIL) 10 MG tablet Take 10 mg by mouth 3 (three) times daily as needed.    EPINEPHrine (EPI-PEN) 0.3 mg/0.3 mL DEVI Inject 0.3 mLs (0.3 mg total) into the muscle once.   estradiol (ESTRACE) 2 MG tablet Take 1 tablet by mouth daily.    Fluticasone-Salmeterol (ADVAIR DISKUS) 100-50 MCG/DOSE AEPB Inhale 1 puff into the lungs 2 (two) times daily.   furosemide (LASIX) 40 MG tablet TAKE 1 TABLET BY MOUTH ONCE DAILY (PLEASE  ATTEND  UPCOMING  VISIT  PRIOR  TO  FURTHER  REFILLS)   levothyroxine (SYNTHROID) 175 MCG tablet Take 175 mcg by mouth daily.   metoprolol tartrate (LOPRESSOR) 25 MG tablet TAKE 1 TABLET BY MOUTH TWICE DAILY.   montelukast (SINGULAIR) 10 MG tablet Take 10 mg by mouth every morning.   ondansetron (ZOFRAN) 4 MG tablet Take 1 tablet (4 mg total) by mouth every 8 (eight) hours as needed for  nausea or vomiting.   ondansetron (ZOFRAN) 4 MG tablet Take 1 tablet (4 mg total) by mouth daily as needed for nausea or vomiting.   oxyCODONE-acetaminophen (PERCOCET) 5-325 MG tablet Take 1-2 tablets by mouth every 6 (six) hours as needed for severe pain. Max 6 tabs per day   pantoprazole (PROTONIX) 20 MG tablet Take 1 tablet by mouth twice daily   phentermine 37.5 MG capsule Take 1 capsule by mouth in the morning   Potassium 95 MG TABS Take 1 tablet by mouth daily.   solifenacin (VESICARE) 5 MG tablet Take 5 mg by mouth daily.    SUMAtriptan (IMITREX) 50 MG tablet Take 50 mg by mouth every 2 (two) hours as needed for migraine. May repeat in 2 hours if headache persists or recurs.   vitamin B-12 (CYANOCOBALAMIN) 500 MCG tablet Take 1 tablet (500 mcg total) by mouth daily.   zinc gluconate 50 MG tablet Take 50 mg by mouth daily.   No facility-administered medications prior to visit.    Review of Systems  {Labs   Heme   Chem   Endocrine   Serology   Results Review (optional):23779}   Objective    There were no vitals taken for this visit. {Show previous vital signs (optional):23777}  Physical Exam  ***  No results found for any visits on 07/04/21.  Assessment & Plan     ***  No follow-ups on file.      {provider attestation***:1}   Lavon Paganini, MD  [facility] [phone] (phone) [phone] (fax)  [place]

## 2021-07-03 ENCOUNTER — Ambulatory Visit: Payer: Medicare Other | Admitting: Oncology

## 2021-07-03 ENCOUNTER — Ambulatory Visit: Payer: Medicare Other

## 2021-07-04 ENCOUNTER — Ambulatory Visit: Payer: Medicare Other | Admitting: Family Medicine

## 2021-07-05 ENCOUNTER — Telehealth: Payer: Self-pay

## 2021-07-05 NOTE — Telephone Encounter (Signed)
Copied from [place] [id number]. Topic: General - Inquiry >> Jul 05, 2021 12:04 PM Greggory Keen D wrote: Reason for CRM: Daughter called saying that she feels mom is having some memory issues.  The family has noticed that she is getting confused on route to place that she is going.  They are concerned because early dementia runs in their family.  Daughter's name is Selinda Eon and her number is [phone] if a nurse could call her back.

## 2021-07-05 NOTE — Progress Notes (Signed)
Established patient visit   Patient: Claire Strong   DOB: 1960-07-10   61 y.o. Female  MRN: [id number] Visit Date: 07/08/2021  Today's healthcare provider: Lavon Paganini, MD   Chief Complaint  Patient presents with   Hypertension   Subjective    HPI  Follow up for obesity  The patient was last seen for this 3 months ago. Changes made at last visit include Discussed importance of healthy weight management,diet and exercise  start with phentermine x19m  Discussed risks and benefits Consider Ozempic in the future.  She reports excellent compliance with treatment. She feels that condition is Improved. She is not having side effects.   -----------------------------------------------------------------------------------------   Hypertension, follow-up  BP Readings from Last 3 Encounters:  07/08/21 124/80  04/02/21 100/70  12/26/20 127/87   Wt Readings from Last 3 Encounters:  07/08/21 212 lb (96.2 kg)  04/02/21 234 lb 3.2 oz (106.2 kg)  12/26/20 231 lb 8 oz (105 kg)     She was last seen for hypertension  months ago.  Management since that visit includes continue current medication. Also followed by cardiology  Atrial tachycardia:followed by Cardiology   Daughter is concerned that her memory is changing. She is missing appointments, getting lost, seeming confused. Worsening slowly over last 1.5 years, but more rapidly since stopping working with her husband.  Mother and aunts with dementia/alzheimers between ages 58-80. Daughter is worried about this.  She reports that this is what she has been depressed about.  Taking prevagen x1-2 weeks and feels like it is helping. She really doesn't want to have someone take care of her.  Medications: Outpatient Medications Prior to Visit  Medication Sig   acetaminophen (TYLENOL) 500 MG tablet Take 1,000 mg by mouth daily at 8 pm.   albuterol (VENTOLIN HFA) 108 (90 Base) MCG/ACT inhaler Inhale 2 puffs into the lungs  every 6 (six) hours as needed for wheezing or shortness of breath.   Apoaequorin (PREVAGEN EXTRA STRENGTH) 20 MG CAPS Take by mouth.   Ascorbic Acid (VITAMIN C) 100 MG tablet Take by mouth daily. Unsure dose   aspirin 81 MG tablet Take 81 mg by mouth daily.   benazepril (LOTENSIN) 10 MG tablet Take 1 tablet (10 mg total) by mouth daily.   bisacodyl (DULCOLAX) 5 MG EC tablet Take 5 mg by mouth daily.   buPROPion (WELLBUTRIN SR) 150 MG 12 hr tablet Take 1 tablet by mouth twice daily   calcium carbonate (CALCIUM 600) 600 MG TABS tablet Take 1 tablet (600 mg total) 2 (two) times daily with a meal by mouth.   cetirizine (ZYRTEC) 10 MG tablet Take 10 mg by mouth daily.   Cholecalciferol (VITAMIN D3) 1000 UNITS tablet Take 2,000 Units by mouth daily.   citalopram (CELEXA) 40 MG tablet Take 1 tablet by mouth once daily   cyclobenzaprine (FLEXERIL) 10 MG tablet Take 10 mg by mouth 3 (three) times daily as needed.    EPINEPHrine (EPI-PEN) 0.3 mg/0.3 mL DEVI Inject 0.3 mLs (0.3 mg total) into the muscle once.   estradiol (ESTRACE) 2 MG tablet Take 1 tablet by mouth daily.    Fluticasone-Salmeterol (ADVAIR DISKUS) 100-50 MCG/DOSE AEPB Inhale 1 puff into the lungs 2 (two) times daily.   furosemide (LASIX) 40 MG tablet TAKE 1 TABLET BY MOUTH ONCE DAILY (PLEASE  ATTEND  UPCOMING  VISIT  PRIOR  TO  FURTHER  REFILLS)   levothyroxine (SYNTHROID) 175 MCG tablet Take 175 mcg by mouth daily.  metoprolol tartrate (LOPRESSOR) 25 MG tablet TAKE 1 TABLET BY MOUTH TWICE DAILY.   montelukast (SINGULAIR) 10 MG tablet Take 10 mg by mouth every morning.   ondansetron (ZOFRAN) 4 MG tablet Take 1 tablet (4 mg total) by mouth every 8 (eight) hours as needed for nausea or vomiting.   ondansetron (ZOFRAN) 4 MG tablet Take 1 tablet (4 mg total) by mouth daily as needed for nausea or vomiting.   oxyCODONE-acetaminophen (PERCOCET) 5-325 MG tablet Take 1-2 tablets by mouth every 6 (six) hours as needed for severe pain. Max 6 tabs  per day   pantoprazole (PROTONIX) 20 MG tablet Take 1 tablet by mouth twice daily   Potassium 95 MG TABS Take 1 tablet by mouth daily.   solifenacin (VESICARE) 5 MG tablet Take 5 mg by mouth daily.    SUMAtriptan (IMITREX) 50 MG tablet Take 50 mg by mouth every 2 (two) hours as needed for migraine. May repeat in 2 hours if headache persists or recurs.   vitamin B-12 (CYANOCOBALAMIN) 500 MCG tablet Take 1 tablet (500 mcg total) by mouth daily.   zinc gluconate 50 MG tablet Take 50 mg by mouth daily.   [DISCONTINUED] phentermine 37.5 MG capsule Take 1 capsule by mouth in the morning   No facility-administered medications prior to visit.    Review of Systems  Constitutional:  Positive for fatigue and fever. Negative for activity change, appetite change, chills, diaphoresis and unexpected weight change.  Respiratory:  Positive for cough. Negative for apnea, choking, chest tightness, shortness of breath, wheezing and stridor.   Cardiovascular: Negative.   Gastrointestinal: Negative.   Musculoskeletal:  Negative for myalgias.  Neurological:  Positive for light-headedness. Negative for dizziness and headaches.      Objective    BP 124/80 Comment: home reading   Pulse 87    Temp (!) 96.3 F (35.7 C) (Temporal)    Wt 212 lb (96.2 kg)    SpO2 100%    BMI 32.23 kg/m    Physical Exam Vitals reviewed.  Constitutional:      General: She is not in acute distress.    Appearance: Normal appearance. She is well-developed. She is not diaphoretic.  HENT:     Head: Normocephalic and atraumatic.  Eyes:     General: No scleral icterus.    Conjunctiva/sclera: Conjunctivae normal.  Neck:     Thyroid: No thyromegaly.  Cardiovascular:     Rate and Rhythm: Normal rate and regular rhythm.     Pulses: Normal pulses.     Heart sounds: Normal heart sounds. No murmur heard. Pulmonary:     Effort: Pulmonary effort is normal. No respiratory distress.     Breath sounds: Normal breath sounds. No wheezing,  rhonchi or rales.  Musculoskeletal:     Cervical back: Neck supple.     Right lower leg: Edema present.     Left lower leg: Edema present.  Lymphadenopathy:     Cervical: No cervical adenopathy.  Skin:    General: Skin is warm and dry.     Findings: No rash.  Neurological:     Mental Status: She is alert and oriented to person, place, and time. Mental status is at baseline.  Psychiatric:        Mood and Affect: Mood is depressed. Affect is tearful.        Behavior: Behavior normal.      Results for orders placed or performed in visit on 07/08/21  POCT urinalysis dipstick  Result Value  Ref Range   Color, UA     Clarity, UA     Glucose, UA Negative Negative   Bilirubin, UA Negative    Ketones, UA Negative    Spec Grav, UA 1.015 1.010 - 1.025   Blood, UA Negative    pH, UA 6.0 5.0 - 8.0   Protein, UA Negative Negative   Urobilinogen, UA 0.2 0.2 or 1.0 E.U./dL   Nitrite, UA Negative    Leukocytes, UA Negative Negative   Appearance     Odor      Assessment & Plan     Problem List Items Addressed This Visit       Cardiovascular and Mediastinum   HTN (hypertension) - Primary    Well controlled Continue current medications Recheck metabolic panel F/u in 6 months       Relevant Orders   Comprehensive metabolic panel   Lipid panel     Other   Obesity    Congratulated on significant weight loss Need to hold phentermine for ~3 m  Discussed importance of healthy weight management Discussed diet and exercise       Memory changes    Long discussion with patient and her daughter regarding the changes that they have seen in her memory recently Seems it has been progressing over last 1.5 yrs with significant drop in the last several weeks while sick No signs of infection on UA today Encouraged patient to see Neuro and she hesitantly agrees to referral Advised that they will do additional testing and possible treatment      Relevant Orders   Ambulatory referral to  Neurology   POCT urinalysis dipstick (Completed)     Return in about 3 months (around 10/06/2021) for chronic disease f/u.       I,Laura E Walsh,acting as a Education administrator for Lavon Paganini, MD.,have documented all relevant documentation on the behalf of Lavon Paganini, MD,as directed by  Lavon Paganini, MD while in the presence of Lavon Paganini, MD.  Total time spent on today's visit was greater than 40 minutes, including both face-to-face time and nonface-to-face time personally spent on review of chart (labs and imaging), discussing labs and goals, discussing further work-up, treatment options, referrals to specialist, answering patient's questions, and coordinating care.     I, Lavon Paganini, MD, have reviewed all documentation for this visit. The documentation on 07/08/21 for the exam, diagnosis, procedures, and orders are all accurate and complete.   Gerry Blanchfield, Dionne Bucy, MD, MPH [facility]

## 2021-07-08 ENCOUNTER — Other Ambulatory Visit: Payer: Self-pay

## 2021-07-08 ENCOUNTER — Ambulatory Visit (INDEPENDENT_AMBULATORY_CARE_PROVIDER_SITE_OTHER): Payer: Medicare Other | Admitting: Family Medicine

## 2021-07-08 ENCOUNTER — Encounter: Payer: Self-pay | Admitting: Family Medicine

## 2021-07-08 VITALS — BP 124/80 | HR 87 | Temp 96.3°F | Wt 212.0 lb

## 2021-07-08 DIAGNOSIS — E669 Obesity, unspecified: Secondary | ICD-10-CM

## 2021-07-08 DIAGNOSIS — Z6832 Body mass index (BMI) 32.0-32.9, adult: Secondary | ICD-10-CM

## 2021-07-08 DIAGNOSIS — I10 Essential (primary) hypertension: Secondary | ICD-10-CM

## 2021-07-08 DIAGNOSIS — R413 Other amnesia: Secondary | ICD-10-CM | POA: Diagnosis not present

## 2021-07-08 DIAGNOSIS — E890 Postprocedural hypothyroidism: Secondary | ICD-10-CM | POA: Diagnosis not present

## 2021-07-08 LAB — POCT URINALYSIS DIPSTICK
Bilirubin, UA: NEGATIVE
Blood, UA: NEGATIVE
Glucose, UA: NEGATIVE
Ketones, UA: NEGATIVE
Leukocytes, UA: NEGATIVE
Nitrite, UA: NEGATIVE
Protein, UA: NEGATIVE
Spec Grav, UA: 1.015 (ref 1.010–1.025)
Urobilinogen, UA: 0.2 E.U./dL
pH, UA: 6 (ref 5.0–8.0)

## 2021-07-08 NOTE — Assessment & Plan Note (Signed)
Congratulated on significant weight loss Need to hold phentermine for ~3 m  Discussed importance of healthy weight management Discussed diet and exercise

## 2021-07-08 NOTE — Assessment & Plan Note (Signed)
Well controlled Continue current medications Recheck metabolic panel F/u in 6 months  

## 2021-07-08 NOTE — Assessment & Plan Note (Signed)
Long discussion with patient and her daughter regarding the changes that they have seen in her memory recently Seems it has been progressing over last 1.5 yrs with significant drop in the last several weeks while sick No signs of infection on UA today Encouraged patient to see Neuro and she hesitantly agrees to referral Advised that they will do additional testing and possible treatment

## 2021-07-09 ENCOUNTER — Inpatient Hospital Stay (HOSPITAL_BASED_OUTPATIENT_CLINIC_OR_DEPARTMENT_OTHER): Payer: Medicare Other | Admitting: Nurse Practitioner

## 2021-07-09 ENCOUNTER — Inpatient Hospital Stay: Payer: Medicare Other

## 2021-07-09 ENCOUNTER — Encounter: Payer: Self-pay | Admitting: Nurse Practitioner

## 2021-07-09 ENCOUNTER — Encounter: Payer: Self-pay | Admitting: Family Medicine

## 2021-07-09 VITALS — BP 116/92 | HR 78 | Temp 98.7°F | Resp 20 | Wt 209.1 lb

## 2021-07-09 DIAGNOSIS — E538 Deficiency of other specified B group vitamins: Secondary | ICD-10-CM | POA: Diagnosis not present

## 2021-07-09 DIAGNOSIS — I10 Essential (primary) hypertension: Secondary | ICD-10-CM | POA: Diagnosis not present

## 2021-07-09 DIAGNOSIS — Z882 Allergy status to sulfonamides status: Secondary | ICD-10-CM | POA: Diagnosis not present

## 2021-07-09 DIAGNOSIS — Z8249 Family history of ischemic heart disease and other diseases of the circulatory system: Secondary | ICD-10-CM | POA: Diagnosis not present

## 2021-07-09 DIAGNOSIS — Z9884 Bariatric surgery status: Secondary | ICD-10-CM | POA: Diagnosis not present

## 2021-07-09 DIAGNOSIS — E890 Postprocedural hypothyroidism: Secondary | ICD-10-CM | POA: Diagnosis not present

## 2021-07-09 DIAGNOSIS — Z833 Family history of diabetes mellitus: Secondary | ICD-10-CM | POA: Diagnosis not present

## 2021-07-09 DIAGNOSIS — Z85820 Personal history of malignant melanoma of skin: Secondary | ICD-10-CM | POA: Diagnosis not present

## 2021-07-09 DIAGNOSIS — Z87891 Personal history of nicotine dependence: Secondary | ICD-10-CM | POA: Diagnosis not present

## 2021-07-09 DIAGNOSIS — Z8711 Personal history of peptic ulcer disease: Secondary | ICD-10-CM | POA: Diagnosis not present

## 2021-07-09 DIAGNOSIS — Z801 Family history of malignant neoplasm of trachea, bronchus and lung: Secondary | ICD-10-CM | POA: Diagnosis not present

## 2021-07-09 DIAGNOSIS — R1310 Dysphagia, unspecified: Secondary | ICD-10-CM | POA: Diagnosis not present

## 2021-07-09 DIAGNOSIS — R531 Weakness: Secondary | ICD-10-CM | POA: Diagnosis not present

## 2021-07-09 DIAGNOSIS — Z79899 Other long term (current) drug therapy: Secondary | ICD-10-CM | POA: Diagnosis not present

## 2021-07-09 DIAGNOSIS — Z86718 Personal history of other venous thrombosis and embolism: Secondary | ICD-10-CM | POA: Diagnosis not present

## 2021-07-09 DIAGNOSIS — Z9049 Acquired absence of other specified parts of digestive tract: Secondary | ICD-10-CM | POA: Diagnosis not present

## 2021-07-09 DIAGNOSIS — D508 Other iron deficiency anemias: Secondary | ICD-10-CM | POA: Diagnosis not present

## 2021-07-09 DIAGNOSIS — Z87442 Personal history of urinary calculi: Secondary | ICD-10-CM | POA: Diagnosis not present

## 2021-07-09 DIAGNOSIS — R5383 Other fatigue: Secondary | ICD-10-CM | POA: Diagnosis not present

## 2021-07-09 DIAGNOSIS — Z8349 Family history of other endocrine, nutritional and metabolic diseases: Secondary | ICD-10-CM | POA: Diagnosis not present

## 2021-07-09 LAB — COMPREHENSIVE METABOLIC PANEL
ALT: 16 IU/L (ref 0–32)
AST: 17 IU/L (ref 0–40)
Albumin/Globulin Ratio: 2 (ref 1.2–2.2)
Albumin: 4.7 g/dL (ref 3.8–4.9)
Alkaline Phosphatase: 142 IU/L — ABNORMAL HIGH (ref 44–121)
BUN/Creatinine Ratio: 13 (ref 9–23)
BUN: 14 mg/dL (ref 6–24)
Bilirubin Total: 0.5 mg/dL (ref 0.0–1.2)
CO2: 24 mmol/L (ref 20–29)
Calcium: 9.8 mg/dL (ref 8.7–10.2)
Chloride: 97 mmol/L (ref 96–106)
Creatinine, Ser: 1.07 mg/dL — ABNORMAL HIGH (ref 0.57–1.00)
Globulin, Total: 2.4 g/dL (ref 1.5–4.5)
Glucose: 129 mg/dL — ABNORMAL HIGH (ref 70–99)
Potassium: 3.9 mmol/L (ref 3.5–5.2)
Sodium: 138 mmol/L (ref 134–144)
Total Protein: 7.1 g/dL (ref 6.0–8.5)
eGFR: 60 mL/min/{1.73_m2} (ref >59)

## 2021-07-09 LAB — LIPID PANEL
Chol/HDL Ratio: 3.8 ratio (ref 0.0–4.4)
Cholesterol, Total: 201 mg/dL — ABNORMAL HIGH (ref 100–199)
HDL: 53 mg/dL (ref >39)
LDL Chol Calc (NIH): 108 mg/dL — ABNORMAL HIGH (ref 0–99)
Triglycerides: 234 mg/dL — ABNORMAL HIGH (ref 0–149)
VLDL Cholesterol Cal: 40 mg/dL (ref 5–40)

## 2021-07-09 NOTE — Progress Notes (Signed)
Hematology/Oncology Progress Note [facility] Telephone:([phone] [phone]   Patient Care Team: Virginia Crews, MD as PCP - General (Family Medicine) Minna Merritts, MD as PCP - Cardiology (Cardiology) Samara Deist, DPM as Referring Physician (Podiatry) Schnier, Dolores Lory, MD (Vascular Surgery) Hessie Knows, MD as Consulting Physician (Orthopedic Surgery) Earlie Server, MD as Consulting Physician (Hematology and Oncology) Gabriel Carina, Betsey Holiday, MD as Consulting Physician (Endocrinology)  REFERRING PROVIDER: Dr.Tahiliani  CHIEF COMPLAINTS/REASON FOR VISIT:  iron deficiency anemia  HISTORY OF PRESENTING ILLNESS:  Claire Strong is a  61 y.o.  female with PMH listed below who was referred to me for evaluation of iron deficiency anemia. Patient was referred to GI for evaluation of 3 months history of dysphagia with solid food. Her previous labs showed low normal ferritin level at 15.Marland Kitchen Referred to me for further evaluation of iron deficiency. She has a history of gastric bypass in 2006. Previous colonoscopy showed hyperplastic polyps.   Associated signs and symptoms: Patient reports fatigue.  Denies SOB with exertion.  Denies weight loss, easy bruising, hematochezia, hemoptysis, hematuria. Context: History of gastric bypass History of iron deficiency: Yes, reports history of IV iron infusion.  Rectal bleeding: deneis Menstrual bleeding/ Vaginal bleeding : hysterotomy at age of 8  Hematemesis or hemoptysis : denies Last endoscopy:2006 Fatigue: Yes.  SOB: deneis  # Also history of DVT, after 1st pregnancy, history of positive lupus anticoagulant, per patient, repeat antibody panel was negative. Currently not on anticoagulation, on Aspirin.   History of basal cell carcinoma.   # EGD and colonoscopy on 07/01/2018 EGD shows reflux esophagitis.  No atypia or malignancy. Colonoscopy showed tubular adenoma.  Negative for high-grade dysplasia or  malignancy.  INTERVAL HISTORY Claire Strong is a 61 y.o. female returns to clinic for further evaluation and consideration of IV feraheme. She reports fatigue and weakness. She had a fall last month when she tripped in parking lot. She did not seek medical care. She's been intentionally losing weight by eating 1000 calorie diet and focusing on protein. Recently had respiratory infection and was treated with antibiotics.   Review of Systems  Constitutional:  Positive for fatigue. Negative for appetite change, chills and fever.  HENT:   Negative for hearing loss and voice change.   Eyes:  Negative for eye problems.  Respiratory:  Negative for chest tightness, cough and shortness of breath.   Cardiovascular:  Negative for chest pain.  Gastrointestinal:  Negative for abdominal distention, abdominal pain, blood in stool, constipation and diarrhea.  Endocrine: Negative for hot flashes.  Genitourinary:  Negative for difficulty urinating and frequency.   Musculoskeletal:  Negative for arthralgias.  Skin:  Negative for itching and rash.  Neurological:  Negative for extremity weakness.  Hematological:  Negative for adenopathy. Does not bruise/bleed easily.  Psychiatric/Behavioral:  Negative for confusion and depression. The patient is not nervous/anxious.    MEDICAL HISTORY:  Past Medical History:  Diagnosis Date   Anxiety    Basal cell carcinoma    nose   Bleeding disorder (HCC)    Bronchial asthma    Cancer ([place])    melanoma; right knee   Cavovarus deformity of foot    bilateral, with L 5th bunionette (Dr. Gigi Gin ortho)   Chronic venous insufficiency 2007   s/p vein stripping and laser ablation   Complication of anesthesia    Depression    Diverticulosis    mild by colonoscopy   Headache    Migraines  History of DVT (deep vein thrombosis)    DVTs after 1st pregnancy, not on AC 2/2 bleeding ulcer, greenfield filter in place   History of gastric ulcer 2009   History of kidney  stones    Horseshoe kidney    sole, R kidney damage   HTN (hypertension)    Hx of degenerative disc disease    Hypothyroidism    Kidney stones    s/p renal hematoma after lithotripsy on right   Lupus anticoagulant disorder (HCC)    Multinodular goiter    s/p thyroidectomy   Nasal septal perforation    chronic, ENT rec avoid antihistamine, INS   Osteoarthritis    ?FM by rheum   Overweight    PONV (postoperative nausea and vomiting)    Post-surgical hypothyroidism    for multinodular goiter   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 Frutoso Chase   Seasonal allergies    allergy shots, singulair   Sight deterioration    disc in back   Sleep apnea    lost weight   Tachycardia     SURGICAL HISTORY: Past Surgical History:  Procedure Laterality Date   ABDOMINAL HERNIA REPAIR     ABDOMINAL HYSTERECTOMY     ANKLE FUSION Left 05/2013   APPENDECTOMY  1978   BACK SURGERY  2009   CESAREAN SECTION     X 2   CHOLECYSTECTOMY  1998   COLONOSCOPY  05/2012   hyperplastic polyps x2, mild diverticulosis Deatra Ina) rpt 10 yrs   COLONOSCOPY WITH PROPOFOL N/A 07/01/2018   Procedure: COLONOSCOPY WITH PROPOFOL;  Surgeon: Virgel Manifold, MD;  Location: [facility];  Service: Endoscopy;  Laterality: N/A;   CYST EXCISION     back  and shoulder   ESI Right 10/2015, 02/2016   C4/5 then C5/6 ESI ([place])   ESI Bilateral 06/2015, 08/2015, 03/2016, 09/2017   L4/5 transforaminal ESI ([place])   ESOPHAGOGASTRODUODENOSCOPY (EGD) WITH PROPOFOL N/A 07/01/2018   Procedure: ESOPHAGOGASTRODUODENOSCOPY (EGD) WITH PROPOFOL;  Surgeon: Virgel Manifold, MD;  Location: [facility];  Service: Endoscopy;  Laterality: N/A;   FL HIP INJECTION ([place] HX) Left 11/2016   Dr Sharlet Salina   FUSION OF TALONAVICULAR JOINT Left 09/12/2016   Procedure: FUSION OF TALONAVICULAR JOINT WITH TRINITY BONE GRAFT;  Surgeon: Samara Deist, DPM;  Location: [facility];  Service: Podiatry;  Laterality: Left;   GASTRIC BYPASS  05/21/05   Dr.  Frutoso Chase, Roux-en-Y   Greenfield filter removal  2015   removed 6 wks after surgery   HAMMER TOE SURGERY Left 10/03/2020   Procedure: HAMMER TOE CORRECTION 2,3,4 AND Z - PLASTY LEFT;  Surgeon: Samara Deist, DPM;  Location: [place];  Service: Podiatry;  Laterality: Left;   hammerhead toes Left    HERNIA REPAIR     X 3   INCISION AND DRAINAGE OF WOUND Left 10/24/2020   Procedure: IRRIGATION AND DEBRIDEMENT; BELOW FASCIA FOOT; MULTIPLE;  Surgeon: Samara Deist, DPM;  Location: [place];  Service: Podiatry;  Laterality: Left;  IV with Local   IVC FILTER INSERTION N/A 09/09/2016   Procedure: IVC Filter Insertion;  Surgeon: Katha Cabal, MD;  Location: [place] [facility];  Service: Cardiovascular;  Laterality: N/A;   IVC FILTER REMOVAL N/A 11/25/2016   Procedure: IVC Filter Removal;  Surgeon: Katha Cabal, MD;  Location: [place] [facility];  Service: Cardiovascular;  Laterality: N/A;   JOINT REPLACEMENT     lyphoma excision  08/2015   arm   NASAL SEPTUM  SURGERY  1981   deviated septum, repaired at [place]   ovaries remain   SCAR REVISION N/A 08/02/2020   Procedure: ADVANCEMENT FLAP SCAR REVISION;  Surgeon: Margaretha Sheffield, MD;  Location: [place];  Service: ENT;  Laterality: N/A;   SKIN SURGERY     basel cell   [place] Right 11/06/2015   Procedure: TOTAL HIP ARTHROPLASTY ANTERIOR APPROACH;  Surgeon: Hessie Knows, MD;  Location: [facility];  Service: Orthopedics;  Laterality: Right;   TOTAL HIP ARTHROPLASTY Left 03/19/2017   Procedure: TOTAL HIP ARTHROPLASTY ANTERIOR APPROACH;  Surgeon: Hessie Knows, MD;  Location: [facility];  Service: Orthopedics;  Laterality: Left;   TOTAL KNEE ARTHROPLASTY Left 08/2013   TOTAL THYROIDECTOMY  09/2011   benign path (done for multinodular goiter concern for cancer)   WEIL OSTEOTOMY Left 10/03/2020   Procedure: WEIL OSTEOTOMY 3RD LEFT;  Surgeon: Samara Deist, DPM;  Location: [place];  Service: Podiatry;  Laterality: Left;    SOCIAL HISTORY: Social History   Socioeconomic History   Marital status: Married    Spouse name: Gwyndolyn Saxon   Number of children: 2   Years of education: 12   Highest education level: High school graduate  Occupational History   Occupation: stay at home  Tobacco Use   Smoking status: Former    Packs/day: 0.25    Types: Cigarettes    Quit date: 06/16/2004    Years since quitting: 17.0   Smokeless tobacco: Never   Tobacco comments:    Socially-no longer  Vaping Use   Vaping Use: Never used  Substance and Sexual Activity   Alcohol use: Yes    Alcohol/week: 0.0 standard drinks    Comment: Occasionally-beer or bloody mary monthly   Drug use: No   Sexual activity: Yes    Partners: Male    Birth control/protection: Surgical  Other Topics Concern   Not on file  Social History Narrative   Not on file   Social Determinants of Health   Financial Resource Strain: Not on file  Food Insecurity: Not on file  Transportation Needs: Not on file  Physical Activity: Not on file  Stress: Not on file  Social Connections: Not on file  Intimate Partner Violence: Not on file    FAMILY HISTORY: Family History  Problem Relation Age of Onset   Diabetes Mother        prediabetes   Alzheimer's disease Mother    Hyperlipidemia Mother    Diabetes Father    Coronary artery disease Father 57       CABG   Alcohol abuse Father    Lung cancer Father    Brain cancer Maternal Uncle    Diabetes Paternal Grandmother    Lung cancer Brother    Alzheimer's disease Brother    Diabetes Maternal Grandmother    Stroke Neg Hx    Colon cancer Neg Hx    Stomach cancer Neg Hx    Kidney disease Neg Hx    Breast cancer Neg Hx    Kidney cancer Neg Hx    Bladder Cancer Neg Hx    Ovarian cancer Neg Hx     ALLERGIES:  is allergic to elemental sulfur, sulfa antibiotics, cefuroxime axetil, nutritional supplements,  other, oxycodone-acetaminophen, betadine [povidone iodine], and latex.  MEDICATIONS:  Current Outpatient Medications  Medication Sig Dispense Refill   acetaminophen (TYLENOL) 500 MG tablet Take 1,000 mg by mouth daily at  8 pm.     albuterol (VENTOLIN HFA) 108 (90 Base) MCG/ACT inhaler Inhale 2 puffs into the lungs every 6 (six) hours as needed for wheezing or shortness of breath. 6.7 g 0   Apoaequorin (PREVAGEN EXTRA STRENGTH) 20 MG CAPS Take by mouth.     Ascorbic Acid (VITAMIN C) 100 MG tablet Take by mouth daily. Unsure dose     aspirin 81 MG tablet Take 81 mg by mouth daily.     benazepril (LOTENSIN) 10 MG tablet Take 1 tablet (10 mg total) by mouth daily. 90 tablet 3   bisacodyl (DULCOLAX) 5 MG EC tablet Take 5 mg by mouth daily.     buPROPion (WELLBUTRIN SR) 150 MG 12 hr tablet Take 1 tablet by mouth twice daily 180 tablet 0   calcium carbonate (CALCIUM 600) 600 MG TABS tablet Take 1 tablet (600 mg total) 2 (two) times daily with a meal by mouth. 60 tablet    cetirizine (ZYRTEC) 10 MG tablet Take 10 mg by mouth daily.     Cholecalciferol (VITAMIN D3) 1000 UNITS tablet Take 2,000 Units by mouth daily.     citalopram (CELEXA) 40 MG tablet Take 1 tablet by mouth once daily 90 tablet 0   cyclobenzaprine (FLEXERIL) 10 MG tablet Take 10 mg by mouth 3 (three) times daily as needed.      EPINEPHrine (EPI-PEN) 0.3 mg/0.3 mL DEVI Inject 0.3 mLs (0.3 mg total) into the muscle once. 1 Device 2   estradiol (ESTRACE) 2 MG tablet Take 1 tablet by mouth daily.   0   Fluticasone-Salmeterol (ADVAIR DISKUS) 100-50 MCG/DOSE AEPB Inhale 1 puff into the lungs 2 (two) times daily. 60 each 5   furosemide (LASIX) 40 MG tablet TAKE 1 TABLET BY MOUTH ONCE DAILY (PLEASE  ATTEND  UPCOMING  VISIT  PRIOR  TO  FURTHER  REFILLS) 30 tablet 0   levothyroxine (SYNTHROID) 175 MCG tablet Take 175 mcg by mouth daily.     metoprolol tartrate (LOPRESSOR) 25 MG tablet TAKE 1 TABLET BY MOUTH TWICE DAILY. 180 tablet 3    montelukast (SINGULAIR) 10 MG tablet Take 10 mg by mouth every morning.     ondansetron (ZOFRAN) 4 MG tablet Take 1 tablet (4 mg total) by mouth every 8 (eight) hours as needed for nausea or vomiting. 30 tablet 0   ondansetron (ZOFRAN) 4 MG tablet Take 1 tablet (4 mg total) by mouth daily as needed for nausea or vomiting. 30 tablet 1   oxyCODONE-acetaminophen (PERCOCET) 5-325 MG tablet Take 1-2 tablets by mouth every 6 (six) hours as needed for severe pain. Max 6 tabs per day 30 tablet 0   pantoprazole (PROTONIX) 20 MG tablet Take 1 tablet by mouth twice daily 60 tablet 2   Potassium 95 MG TABS Take 1 tablet by mouth daily.     solifenacin (VESICARE) 5 MG tablet Take 5 mg by mouth daily.      SUMAtriptan (IMITREX) 50 MG tablet Take 50 mg by mouth every 2 (two) hours as needed for migraine. May repeat in 2 hours if headache persists or recurs.     vitamin B-12 (CYANOCOBALAMIN) 500 MCG tablet Take 1 tablet (500 mcg total) by mouth daily. 90 tablet 1   zinc gluconate 50 MG tablet Take 50 mg by mouth daily.     No current facility-administered medications for this visit.    PHYSICAL EXAMINATION: ECOG PERFORMANCE STATUS: 1 - Symptomatic but completely ambulatory Vitals:   07/09/21 1350  BP: (!) 116/92  Pulse: 78  Resp: 20  Temp: 98.7 F (37.1 C)  SpO2: 100%   Filed Weights   07/09/21 1350  Weight: 209 lb 1.6 oz (94.8 kg)    Physical Exam Constitutional:      General: She is not in acute distress. Eyes:     General: No scleral icterus. Cardiovascular:     Rate and Rhythm: Normal rate and regular rhythm.  Pulmonary:     Effort: No respiratory distress.     Breath sounds: No wheezing.  Musculoskeletal:        General: No deformity.     Comments: Ambulates w/o aids  Skin:    General: Skin is warm and dry.  Neurological:     Mental Status: She is alert and oriented to person, place, and time.  Psychiatric:        Mood and Affect: Mood normal.        Behavior: Behavior normal.     LABORATORY DATA:  I have reviewed the data as listed Lab Results  Component Value Date   WBC 6.0 07/01/2021   HGB 13.7 07/01/2021   HCT 40.8 07/01/2021   MCV 88.7 07/01/2021   PLT 242 07/01/2021   Recent Labs    07/17/20 1320 09/06/20 1553 12/25/20 1305 07/08/21 1622  NA 135 135 133* 138  K 3.2* 3.2* 4.4 3.9  CL 100 96 102 97  CO2 26 21 23 24   GLUCOSE 88 84 111* 129*  BUN 22* 19 15 14   CREATININE 0.95 1.10* 0.92 1.07*  CALCIUM 8.1* 8.8 7.9* 9.8  GFRNONAA >60  --  >60  --   PROT  --   --  6.0* 7.1  ALBUMIN  --   --  3.4* 4.7  AST  --   --  25 17  ALT  --   --  24 16  ALKPHOS  --   --  87 142*  BILITOT  --   --  0.4 0.5    Iron/TIBC/Ferritin/ %Sat    Component Value Date/Time   IRON 106 07/01/2021 1256   IRON 54 02/03/2018 1033   TIBC 407 07/01/2021 1256   TIBC 358 02/03/2018 1033   FERRITIN 39 07/01/2021 1256   FERRITIN 16 02/03/2018 1033   IRONPCTSAT 26 07/01/2021 1256   IRONPCTSAT 15 02/03/2018 1033      ASSESSMENT & PLAN:  No diagnosis found.  #History of iron deficiency anemia, history of gastric bypass. Hemoglobin has normalized without evidence of microcytosis. Last feraheme January 2022. Ferritin is greater than goal of 30, currently 39. Normal iron saturation and tibc. Hold IV iron.   #Vitamin B12 deficiency- level was previously adequate. continue every other day oral vitamin B12 1000 MCG.  I will repeat B12 in the next visit.  #Hypocalcemia- follow up with pcp  #On hormone replacement therapy-follow up with pcp for ongoing breast cancer screenings.  # fall- mechanical fall  #weakness- suspect related to recent illness and reduced calorie diet. May increase calorie intake during illness due to increased metabolic demands.   No orders of the defined types were placed in this encounter.   All questions were answered. The patient knows to call the clinic with any problems questions or concerns.  Return of visit:  3 mo- labs (cbc, ferritin,  iron studies, b12) Day to week later in person or virtual follow up with Dr Tasia Catchings  We spent sufficient time to discuss many aspect of care, questions were answered to patient's satisfaction.  Beckey Rutter,  DNP, AGNP-C [place] [facility] [phone] (clinic) 07/09/2021

## 2021-07-15 ENCOUNTER — Telehealth: Payer: Self-pay

## 2021-07-15 NOTE — Telephone Encounter (Signed)
I just got this back from [facility] Neuro today suggesting that she follow at [facility] and sent it back letting them know to schedule as she wants a change. Thanks. They should reach out.

## 2021-07-15 NOTE — Telephone Encounter (Signed)
Copied from [place] [id number]. Topic: Referral - Request for Referral >> Jul 15, 2021 12:12 PM Loma [place] wrote:  Has patient seen PCP for this complaint? No. *If NO, is insurance requiring patient see PCP for this issue before PCP can refer them? Referral for which specialty: Neurology Preferred provider/office: [place] Reason for referral: This was requested by Dr. B on 1/23 as daughter states it is really needed, had requested out of town referral and seems that Kathleen Argue was suggesting [place] and family states does still want original referral as Dr. B request, Nothing as been done to forward referral pls FU with [name], daughter or any family contact

## 2021-07-15 NOTE — Telephone Encounter (Signed)
Please see referrals note.

## 2021-07-22 ENCOUNTER — Encounter: Payer: Self-pay | Admitting: Oncology

## 2021-07-30 ENCOUNTER — Ambulatory Visit: Payer: Medicare Other | Admitting: Neurology

## 2021-08-04 ENCOUNTER — Other Ambulatory Visit: Payer: Self-pay | Admitting: Cardiovascular Disease

## 2021-08-05 NOTE — Telephone Encounter (Signed)
LVM to schedule

## 2021-08-05 NOTE — Telephone Encounter (Signed)
Please schedule 12 mont F/U appointment. Thank you!

## 2021-08-08 ENCOUNTER — Encounter: Payer: Self-pay | Admitting: Neurology

## 2021-08-08 ENCOUNTER — Ambulatory Visit: Payer: Medicare Other | Admitting: Neurology

## 2021-08-08 VITALS — BP 142/96 | HR 74 | Ht 68.0 in | Wt 211.4 lb

## 2021-08-08 DIAGNOSIS — G43009 Migraine without aura, not intractable, without status migrainosus: Secondary | ICD-10-CM

## 2021-08-08 DIAGNOSIS — R7989 Other specified abnormal findings of blood chemistry: Secondary | ICD-10-CM | POA: Diagnosis not present

## 2021-08-08 DIAGNOSIS — E610 Copper deficiency: Secondary | ICD-10-CM

## 2021-08-08 DIAGNOSIS — R4189 Other symptoms and signs involving cognitive functions and awareness: Secondary | ICD-10-CM

## 2021-08-08 DIAGNOSIS — F411 Generalized anxiety disorder: Secondary | ICD-10-CM | POA: Diagnosis not present

## 2021-08-08 DIAGNOSIS — R799 Abnormal finding of blood chemistry, unspecified: Secondary | ICD-10-CM | POA: Diagnosis not present

## 2021-08-08 DIAGNOSIS — E71120 Methylmalonic acidemia: Secondary | ICD-10-CM | POA: Diagnosis not present

## 2021-08-08 DIAGNOSIS — E538 Deficiency of other specified B group vitamins: Secondary | ICD-10-CM | POA: Diagnosis not present

## 2021-08-08 MED ORDER — DONEPEZIL HCL 10 MG PO TABS: 10.0000 mg | ORAL_TABLET | Freq: Every day | ORAL | 4 refills | Status: DC

## 2021-08-08 NOTE — Patient Instructions (Addendum)
We will start Aricept 10 mg nightly, initially half tablet nightly for 2 weeks then increase to full tab Obtain dementia labs  Referral to neuropsych  Follow-up in 1 year or sooner if worse    There are well-accepted and sensible ways to reduce risk for Alzheimers disease and other degenerative brain disorders .  Exercise Daily Walk A daily 20 minute walk should be part of your routine. Disease related apathy can be a significant roadblock to exercise and the only way to overcome this is to make it a daily routine and perhaps have a reward at the end (something your loved one loves to eat or drink perhaps) or a personal trainer coming to the home can also be very useful. Most importantly, the patient is much more likely to exercise if the caregiver / spouse does it with him/her. In general a structured, repetitive schedule is best.  General Health: Any diseases which effect your body will effect your brain such as a pneumonia, urinary infection, blood clot, heart attack or stroke. Keep contact with your primary care doctor for regular follow ups.  Sleep. A good nights sleep is healthy for the brain. Seven hours is recommended. If you have insomnia or poor sleep habits we can give you some instructions. If you have sleep apnea wear your mask.  Diet: Eating a heart healthy diet is also a good idea; fish and poultry instead of red meat, nuts (mostly non-peanuts), vegetables, fruits, olive oil or canola oil (instead of butter), minimal salt (use other spices to flavor foods), whole grain rice, bread, cereal and pasta and wine in moderation.Research is now showing that the MIND diet, which is a combination of The Mediterranean diet and the DASH diet, is beneficial for cognitive processing and longevity. Information about this diet can be found in The MIND Diet, a book by Doyne Keel, MS, RDN, and online at NotebookDist[employer].si  Finances, Power of Attorney and Advance Directives:  You should consider putting legal safeguards in place with regard to financial and medical decision making. While the spouse always has power of attorney for medical and financial issues in the absence of any form, you should consider what you want in case the spouse / caregiver is no longer around or capable of making decisions.     Heart-head connection  New research shows there are things we can do to reduce the risk of mild cognitive impairment and dementia.  Several conditions known to increase the risk of cardiovascular disease -- such as high blood pressure, diabetes and high cholesterol -- also increase the risk of developing Alzheimer's. Some autopsy studies show that as many as 32 percent of individuals with Alzheimer's disease also have cardiovascular disease.  A longstanding question is why some people develop hallmark Alzheimer's plaques and tangles but do not develop the symptoms of Alzheimer's. Vascular disease may help researchers eventually find an answer. Some autopsy studies suggest that plaques and tangles may be present in the brain without causing symptoms of cognitive decline unless the brain also shows evidence of vascular disease. More research is needed to better understand the link between vascular health and Alzheimer's.  Physical exercise and diet Regular physical exercise may be a beneficial strategy to lower the risk of Alzheimer's and vascular dementia. Exercise may directly benefit brain cells by increasing blood and oxygen flow in the brain. Because of its known cardiovascular benefits, a medically approved exercise program is a valuable part of any overall wellness plan.  Current evidence suggests that  heart-healthy eating may also help protect the brain. Heart-healthy eating includes limiting the intake of sugar and saturated fats and making sure to eat plenty of fruits, vegetables, and whole grains. No one diet is best. Two diets that have been studied and may be  beneficial are the DASH (Dietary Approaches to Stop Hypertension) diet and the Mediterranean diet. The DASH diet emphasizes vegetables, fruits and fat-free or low-fat dairy products; includes whole grains, fish, poultry, beans, seeds, nuts and vegetable oils; and limits sodium, sweets, sugary beverages and red meats. A Mediterranean diet includes relatively little red meat and emphasizes whole grains, fruits and vegetables, fish and shellfish, and nuts, olive oil and other healthy fats.  Social connections and intellectual activity A number of studies indicate that maintaining strong social connections and keeping mentally active as we age might lower the risk of cognitive decline and Alzheimer's. Experts are not certain about the reason for this association. It may be due to direct mechanisms through which social and mental stimulation strengthen connections between nerve cells in the brain.  Head trauma There appears to be a strong link between future risk of Alzheimer's and serious head trauma, especially when injury involves loss of consciousness. You can help reduce your risk of Alzheimer's by protecting your head.  Wear a seat belt  Use a helmet when participating in sports  "Fall-proof" your home   What you can do now While research is not yet conclusive, certain lifestyle choices, such as physical activity and diet, may help support brain health and prevent Alzheimer's. Many of these lifestyle changes have been shown to lower the risk of other diseases, like heart disease and diabetes, which have been linked to Alzheimer's. With few drawbacks and plenty of known benefits, healthy lifestyle choices can improve your health and possibly protect your brain.  Learn more about brain health. You can help increase our knowledge by considering participation in a clinical study. Our free clinical trial matching services, TrialMatch, can help you find clinical trials in your area that are seeking  volunteers.

## 2021-08-08 NOTE — Progress Notes (Signed)
r   [facility]  PATIENT: Claire Strong DOB: 04/10/1961  REQUESTING CLINICIAN: Bacigalupo, Dionne Bucy, MD HISTORY FROM: Patient and daughter  REASON FOR VISIT: Memory decline   HISTORICAL  CHIEF COMPLAINT:  Chief Complaint  Patient presents with   New Patient (Initial Visit)    Rm 20. Accompanied by daughter. NP/Internal referral for memory changes. Family hx of alzheimer's. Pt's daughter reports she got lost on a routine drive.    HISTORY OF PRESENT ILLNESS:  This is a 61 year old woman with past medical history of gastric bypass, depression, hypothyroidism, hypertension, vitamin B12 deficiency on supplement and tachycardia who is presenting for memory decline.  Patient reports history of memory decline described as forgetful for the past 2 years but noted now getting worse in the past few months.  She has been getting lost while driving the familiar places therefore she is not driving anymore.  She is forgetful about recent conversation, confused about appointments, sometimes she will miss her appointment and at times she will go to an appointment when she is not scheduled for it.  She reports a strong family history of dementia in her mother diagnosed at the age of 18 and she also have 2 aunts who were diagnosed in the early 53s.  Patient reported she took care of her mother while she was diagnosed with Alzheimer's and currently she is very depressed and anxious and fearful that her daughter will have to take care of her. She reports word finding difficulty, forgetting family members name, and at times left the stove on by accident.   TBI:  No past history of TBI Stroke:  no past history of stroke Seizures:  no past history of seizures Sleep:  no history of sleep apnea.   Mood: Yes  patient denies anxiety and depression  Functional status: independent in all ADLs and IADLs Patient lives with husband. Cooking: Patient Cleaning: Patient  Shopping: with family   Bathing: No help needed Toileting: No help needed Driving: Has not been driving for the past month  Bills: No  Medications: She is on bupropion and citalopram, Flexeril and oxycodone Ever left the stove on by accident?: Yes  Forget how to use items around the house?: No  Getting lost going to familiar places?: Yes  Forgetting loved ones names?: yes  Word finding difficulty? Yes  Sleep: Yes    OTHER MEDICAL CONDITIONS: Depression, Hypothyroidism, hypertension, B 12 deficiency, tachycardia    REVIEW OF SYSTEMS: Full 14 system review of systems performed and negative with exception of: as noted in the family   ALLERGIES: Allergies  Allergen Reactions   Elemental Sulfur Anaphylaxis   Sulfa Antibiotics Shortness Of Breath   Cefuroxime Axetil Other (See Comments)    Bleeding ulcer   Nutritional Supplements Swelling    Unsure which   Other     All narcotics    Oxycodone-Acetaminophen Nausea And Vomiting   Betadine [Povidone Iodine] Rash   Latex Rash    HOME MEDICATIONS: Outpatient Medications Prior to Visit  Medication Sig Dispense Refill   acetaminophen (TYLENOL) 500 MG tablet Take 1,000 mg by mouth daily at 8 pm.     albuterol (VENTOLIN HFA) 108 (90 Base) MCG/ACT inhaler Inhale 2 puffs into the lungs every 6 (six) hours as needed for wheezing or shortness of breath. 6.7 g 0   Apoaequorin (PREVAGEN EXTRA STRENGTH) 20 MG CAPS Take by mouth.     Ascorbic Acid (VITAMIN C) 100 MG tablet Take by mouth daily. Unsure  dose     aspirin 81 MG tablet Take 81 mg by mouth daily.     benazepril (LOTENSIN) 10 MG tablet Take 1 tablet (10 mg total) by mouth daily. PLEASE CALL OFFICE TO SCHEDULE APPOINTMENT FOR FURTHER REFILLS. 30 tablet 0   bisacodyl (DULCOLAX) 5 MG EC tablet Take 5 mg by mouth daily.     buPROPion (WELLBUTRIN SR) 150 MG 12 hr tablet Take 1 tablet by mouth twice daily 180 tablet 0   calcium carbonate (CALCIUM 600) 600 MG TABS tablet Take 1 tablet (600 mg total) 2 (two) times  daily with a meal by mouth. 60 tablet    cetirizine (ZYRTEC) 10 MG tablet Take 10 mg by mouth daily.     Cholecalciferol (VITAMIN D3) 1000 UNITS tablet Take 2,000 Units by mouth daily.     citalopram (CELEXA) 40 MG tablet Take 1 tablet by mouth once daily 90 tablet 0   cyclobenzaprine (FLEXERIL) 10 MG tablet Take 10 mg by mouth 3 (three) times daily as needed.      EPINEPHrine (EPI-PEN) 0.3 mg/0.3 mL DEVI Inject 0.3 mLs (0.3 mg total) into the muscle once. 1 Device 2   estradiol (ESTRACE) 2 MG tablet Take 1 tablet by mouth daily.   0   Fluticasone-Salmeterol (ADVAIR DISKUS) 100-50 MCG/DOSE AEPB Inhale 1 puff into the lungs 2 (two) times daily. 60 each 5   furosemide (LASIX) 40 MG tablet TAKE 1 TABLET BY MOUTH ONCE DAILY (PLEASE  ATTEND  UPCOMING  VISIT  PRIOR  TO  FURTHER  REFILLS) 30 tablet 0   levothyroxine (SYNTHROID) 175 MCG tablet Take 175 mcg by mouth daily.     metoprolol tartrate (LOPRESSOR) 25 MG tablet TAKE 1 TABLET BY MOUTH TWICE DAILY. 180 tablet 3   montelukast (SINGULAIR) 10 MG tablet Take 10 mg by mouth every morning.     ondansetron (ZOFRAN) 4 MG tablet Take 1 tablet (4 mg total) by mouth every 8 (eight) hours as needed for nausea or vomiting. 30 tablet 0   ondansetron (ZOFRAN) 4 MG tablet Take 1 tablet (4 mg total) by mouth daily as needed for nausea or vomiting. 30 tablet 1   oxyCODONE-acetaminophen (PERCOCET) 5-325 MG tablet Take 1-2 tablets by mouth every 6 (six) hours as needed for severe pain. Max 6 tabs per day 30 tablet 0   pantoprazole (PROTONIX) 20 MG tablet Take 1 tablet by mouth twice daily 60 tablet 2   Potassium 95 MG TABS Take 1 tablet by mouth daily.     solifenacin (VESICARE) 5 MG tablet Take 5 mg by mouth daily.      SUMAtriptan (IMITREX) 50 MG tablet Take 50 mg by mouth every 2 (two) hours as needed for migraine. May repeat in 2 hours if headache persists or recurs.     vitamin B-12 (CYANOCOBALAMIN) 500 MCG tablet Take 1 tablet (500 mcg total) by mouth daily. 90  tablet 1   zinc gluconate 50 MG tablet Take 50 mg by mouth daily.     No facility-administered medications prior to visit.    PAST MEDICAL HISTORY: Past Medical History:  Diagnosis Date   Anxiety    Basal cell carcinoma    nose   Bleeding disorder (HCC)    Bronchial asthma    Cancer ([place])    melanoma; right knee   Cavovarus deformity of foot    bilateral, with L 5th bunionette (Dr. Gigi Gin ortho)   Chronic venous insufficiency 2007   s/p vein stripping and laser  ablation   Complication of anesthesia    Depression    Diverticulosis    mild by colonoscopy   Headache    Migraines   History of DVT (deep vein thrombosis)    DVTs after 1st pregnancy, not on AC 2/2 bleeding ulcer, greenfield filter in place   History of gastric ulcer 2009   History of kidney stones    Horseshoe kidney    sole, R kidney damage   HTN (hypertension)    Hx of degenerative disc disease    Hypothyroidism    Kidney stones    s/p renal hematoma after lithotripsy on right   Lupus anticoagulant disorder (HCC)    Multinodular goiter    s/p thyroidectomy   Nasal septal perforation    chronic, ENT rec avoid antihistamine, INS   Osteoarthritis    ?FM by rheum   Overweight    PONV (postoperative nausea and vomiting)    Post-surgical hypothyroidism    for multinodular goiter   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   Dr. Frutoso Chase   Seasonal allergies    allergy shots, singulair   Sight deterioration    disc in back   Sleep apnea    lost weight   Tachycardia     PAST SURGICAL HISTORY: Past Surgical History:  Procedure Laterality Date   ABDOMINAL HERNIA REPAIR     ABDOMINAL HYSTERECTOMY     ANKLE FUSION Left 05/2013   APPENDECTOMY  1978   BACK SURGERY  2009   CESAREAN SECTION     X 2   CHOLECYSTECTOMY  1998   COLONOSCOPY  05/2012   hyperplastic polyps x2, mild diverticulosis Deatra Ina) rpt 10 yrs   COLONOSCOPY WITH PROPOFOL N/A 07/01/2018   Procedure: COLONOSCOPY WITH PROPOFOL;  Surgeon:  Virgel Manifold, MD;  Location: [facility];  Service: Endoscopy;  Laterality: N/A;   CYST EXCISION     back  and shoulder   ESI Right 10/2015, 02/2016   C4/5 then C5/6 ESI ([place])   ESI Bilateral 06/2015, 08/2015, 03/2016, 09/2017   L4/5 transforaminal ESI (Chasnis)   ESOPHAGOGASTRODUODENOSCOPY (EGD) WITH PROPOFOL N/A 07/01/2018   Procedure: ESOPHAGOGASTRODUODENOSCOPY (EGD) WITH PROPOFOL;  Surgeon: Virgel Manifold, MD;  Location: [facility];  Service: Endoscopy;  Laterality: N/A;   FL HIP INJECTION ([place] HX) Left 11/2016   Dr Sharlet Salina   FUSION OF TALONAVICULAR JOINT Left 09/12/2016   Procedure: FUSION OF TALONAVICULAR JOINT WITH TRINITY BONE GRAFT;  Surgeon: Samara Deist, DPM;  Location: [facility];  Service: Podiatry;  Laterality: Left;   GASTRIC BYPASS  05/21/05   Dr. Frutoso Chase, Roux-en-Y   Greenfield filter removal  2015   removed 6 wks after surgery   HAMMER TOE SURGERY Left 10/03/2020   Procedure: HAMMER TOE CORRECTION 2,3,4 AND Z - PLASTY LEFT;  Surgeon: Samara Deist, DPM;  Location: [place];  Service: Podiatry;  Laterality: Left;   hammerhead toes Left    HERNIA REPAIR     X 3   INCISION AND DRAINAGE OF WOUND Left 10/24/2020   Procedure: IRRIGATION AND DEBRIDEMENT; BELOW FASCIA FOOT; MULTIPLE;  Surgeon: Samara Deist, DPM;  Location: [place];  Service: Podiatry;  Laterality: Left;  IV with Local   IVC FILTER INSERTION N/A 09/09/2016   Procedure: IVC Filter Insertion;  Surgeon: Katha Cabal, MD;  Location: [place] [facility];  Service: Cardiovascular;  Laterality: N/A;   IVC FILTER REMOVAL N/A 11/25/2016   Procedure: IVC Filter Removal;  Surgeon: Katha Cabal, MD;  Location:  [place] [facility];  Service: Cardiovascular;  Laterality: N/A;   JOINT REPLACEMENT     lyphoma excision  08/2015   arm   NASAL SEPTUM SURGERY  1981   deviated septum, repaired at [place]   ovaries remain   SCAR REVISION N/A  08/02/2020   Procedure: ADVANCEMENT FLAP SCAR REVISION;  Surgeon: Margaretha Sheffield, MD;  Location: [place];  Service: ENT;  Laterality: N/A;   SKIN SURGERY     basel cell   [place] Right 11/06/2015   Procedure: TOTAL HIP ARTHROPLASTY ANTERIOR APPROACH;  Surgeon: Hessie Knows, MD;  Location: [facility];  Service: Orthopedics;  Laterality: Right;   TOTAL HIP ARTHROPLASTY Left 03/19/2017   Procedure: TOTAL HIP ARTHROPLASTY ANTERIOR APPROACH;  Surgeon: Hessie Knows, MD;  Location: [facility];  Service: Orthopedics;  Laterality: Left;   TOTAL KNEE ARTHROPLASTY Left 08/2013   TOTAL THYROIDECTOMY  09/2011   benign path (done for multinodular goiter concern for cancer)   WEIL OSTEOTOMY Left 10/03/2020   Procedure: WEIL OSTEOTOMY 3RD LEFT;  Surgeon: Samara Deist, DPM;  Location: [place];  Service: Podiatry;  Laterality: Left;    FAMILY HISTORY: Family History  Problem Relation Age of Onset   Diabetes Mother        prediabetes   Alzheimer's disease Mother    Hyperlipidemia Mother    Diabetes Father    Coronary artery disease Father 69       CABG   Alcohol abuse Father    Lung cancer Father    Brain cancer Maternal Uncle    Diabetes Paternal Grandmother    Lung cancer Brother    Alzheimer's disease Brother    Diabetes Maternal Grandmother    Stroke Neg Hx    Colon cancer Neg Hx    Stomach cancer Neg Hx    Kidney disease Neg Hx    Breast cancer Neg Hx    Kidney cancer Neg Hx    Bladder Cancer Neg Hx    Ovarian cancer Neg Hx     SOCIAL HISTORY: Social History   Socioeconomic History   Marital status: Married    Spouse name: Gwyndolyn Saxon   Number of children: 2   Years of education: 12   Highest education level: High school graduate  Occupational History   Occupation: stay at home  Tobacco Use   Smoking status: Former    Packs/day: 0.25    Types: Cigarettes    Quit date: 06/16/2004    Years since quitting: 17.1   Smokeless  tobacco: Never   Tobacco comments:    Socially-no longer  Vaping Use   Vaping Use: Never used  Substance and Sexual Activity   Alcohol use: Yes    Alcohol/week: 0.0 standard drinks    Comment: Occasionally-beer or bloody mary monthly   Drug use: No   Sexual activity: Yes    Partners: Male    Birth control/protection: Surgical  Other Topics Concern   Not on file  Social History Narrative   Not on file   Social Determinants of Health   Financial Resource Strain: Not on file  Food Insecurity: Not on file  Transportation Needs: Not on file  Physical Activity: Not on file  Stress: Not on file  Social Connections: Not on file  Intimate Partner Violence: Not on file    PHYSICAL EXAM  GENERAL EXAM/CONSTITUTIONAL: Vitals:  Vitals:   08/08/21 0841  BP: (!) 142/96  Pulse: 74  Weight: 211 lb 6.4 oz (95.9 kg)  Height: 5\' 8"  (1.727 m)   Body mass index is 32.14 kg/m. Wt Readings from Last 3 Encounters:  08/08/21 211 lb 6.4 oz (95.9 kg)  07/09/21 209 lb 1.6 oz (94.8 kg)  07/08/21 212 lb (96.2 kg)   Patient is in no distress; well developed, nourished and groomed; neck is supple, tearful and crying during the entire interview  CARDIOVASCULAR: Examination of carotid arteries is normal; no carotid bruits Regular rate and rhythm, no murmurs Examination of peripheral vascular system by observation and palpation is normal  EYES: Pupils round and reactive to light, Visual fields full to confrontation, Extraocular movements intacts,   MUSCULOSKELETAL: Gait, strength, tone, movements noted in Neurologic exam below  NEUROLOGIC: MENTAL STATUS:  MMSE - [place] Exam 08/08/2021  Orientation to time 5  Orientation to Place 5  Registration 3  Attention/ Calculation 3  Recall 1  Language- name 2 objects 2  Language- repeat 1  Language- follow 3 step command 3  Language- read & follow direction 1  Write a sentence 1  Copy design 1  Total score 26    CRANIAL NERVE:   2nd, 3rd, 4th, 6th - pupils equal and reactive to light, visual fields full to confrontation, extraocular muscles intact, no nystagmus 5th - facial sensation symmetric 7th - facial strength symmetric 8th - hearing intact 9th - palate elevates symmetrically, uvula midline 11th - shoulder shrug symmetric 12th - tongue protrusion midline  MOTOR:  normal bulk and tone, full strength in the BUE, BLE  SENSORY:  normal and symmetric to light touch, vibration  COORDINATION:  finger-nose-finger, fine finger movements normal  REFLEXES:  deep tendon reflexes present and symmetric  GAIT/STATION:  normal   DIAGNOSTIC DATA (LABS, IMAGING, TESTING) - I reviewed patient records, labs, notes, testing and imaging myself where available.  Lab Results  Component Value Date   WBC 6.0 07/01/2021   HGB 13.7 07/01/2021   HCT 40.8 07/01/2021   MCV 88.7 07/01/2021   PLT 242 07/01/2021      Component Value Date/Time   NA 138 07/08/2021 1622   NA 132 (L) 08/27/2013 0616   K 3.9 07/08/2021 1622   K 4.5 08/27/2013 0616   CL 97 07/08/2021 1622   CL 102 08/27/2013 0616   CO2 24 07/08/2021 1622   CO2 27 08/27/2013 0616   GLUCOSE 129 (H) 07/08/2021 1622   GLUCOSE 111 (H) 12/25/2020 1305   GLUCOSE 107 (H) 08/27/2013 0616   BUN 14 07/08/2021 1622   BUN 12 08/27/2013 0616   CREATININE 1.07 (H) 07/08/2021 1622   CREATININE 0.91 08/27/2013 0616   CREATININE 1.02 09/20/2011 0000   CALCIUM 9.8 07/08/2021 1622   CALCIUM 7.4 (L) 08/27/2013 0616   CALCIUM 7.7 09/20/2011 0000   PROT 7.1 07/08/2021 1622   PROT 6.4 09/20/2011 1746   ALBUMIN 4.7 07/08/2021 1622   ALBUMIN 3.5 09/20/2011 1746   AST 17 07/08/2021 1622   AST 41 (H) 09/20/2011 1746   AST 41 09/20/2011 0000   ALT 16 07/08/2021 1622   ALT 68 09/20/2011 1746   ALKPHOS 142 (H) 07/08/2021 1622   ALKPHOS 127 09/20/2011 1746   ALKPHOS 127 09/20/2011 0000   BILITOT 0.5 07/08/2021 1622   BILITOT 0.4 09/20/2011 1746   BILITOT 0.4 09/20/2011  0000   GFRNONAA >60 12/25/2020 1305   GFRNONAA >60 08/27/2013 0616   GFRAA 72 01/23/2020 0828   GFRAA >60 08/27/2013 0940  Lab Results  Component Value Date   CHOL 201 (H) 07/08/2021   HDL 53 07/08/2021   LDLCALC 108 (H) 07/08/2021   TRIG 234 (H) 07/08/2021   CHOLHDL 3.8 07/08/2021   No results found for: HGBA1C Lab Results  Component Value Date   VITAMINB12 1,170 (H) 12/25/2020   Lab Results  Component Value Date   TSH 123.018 (H) 11/27/2020     MRI Brain with and without contrast 10/08/2019 1. No acute intracranial abnormality or mass. 2. Mild cerebral white matter T2 signal changes, nonspecific though may reflect chronic small vessel ischemic disease or migraines.   ASSESSMENT AND PLAN  61 y.o. year old female with gastric bypass, depression, hypothyroidism, hypertension, vitamin B12 deficiency on supplement, migraine headaches and tachycardia who is presenting with memory decline for the past 2 years.  Patient also reported a strong family history of Alzheimer's dementia.  On exam she scored 26 out of 30 on the Mini-Mental status evaluation consistent with a mild impairment.  Informed patient that based on the history and current exam, she does have cognitive impairment, therefore I would like to start her on Aricept 10 mg nightly, initial take half tab nightly for 2 weeks then increase to full tab.  We will also get dementia labs, due to her history of gastric bypass, I will also obtain a copper level to rule out neurocognitive deficiency due to Low copper. Both patient and daughter were very tearful and crying throughout most of the evaluation.  I spent extended amount of time discussing her diagnosis of dementia, what it means for her in the future, different medications available for her if she needs them and additional ways to reduce the risk of developing dementia in the future including exercise.  I will also send her for a full neuropsychological testing.  She voices  understanding, I will see her in 1 year for follow-up or sooner if worse.   1. Cognitive impairment      Patient Instructions  Aricept  Dementia labs  Referral to neuropsych   Orders Placed This Encounter  Procedures   Methylmalonic acid, serum   Dementia Panel   Copper, Blood   Copper, serum   Ambulatory referral to Neuropsychology    Meds ordered this encounter  Medications   donepezil (ARICEPT) 10 MG tablet    Sig: Take 1 tablet (10 mg total) by mouth at bedtime.    Dispense:  90 tablet    Refill:  4    Return in about 1 year (around 08/08/2022).    Alric Ran, MD 08/08/2021, 4:32 PM  [facility] [address], [place], [place] [address] [phone]

## 2021-08-09 ENCOUNTER — Other Ambulatory Visit: Payer: Self-pay | Admitting: Family Medicine

## 2021-08-09 ENCOUNTER — Encounter: Payer: Self-pay | Admitting: Family Medicine

## 2021-08-09 NOTE — Telephone Encounter (Signed)
Medication Refill - Medication: montelukast (SINGULAIR) 10 MG tablet.   Pt stated PCP has never prescribed but she would like her to start. Pt stated she can only take this brand and has to be Rx.   Has the patient contacted their pharmacy? No. No refills.  (Agent: If yes, when and what did the pharmacy advise?)  Preferred Pharmacy (with phone number or street name):  [place], [place] - [address]  [place] [address]  Phone: [phone] Fax: [phone]  Hours: Not open 24 hours   Has the patient been seen for an appointment in the last year OR does the patient have an upcoming appointment? Yes.    Agent: Please be advised that RX refills may take up to 3 business days. We ask that you follow-up with your pharmacy.

## 2021-08-09 NOTE — Telephone Encounter (Signed)
Requested medication (s) are due for refill today: historical medication  Requested medication (s) are on the active medication list: yes   Last refill:  na   Future visit scheduled: yes in 2 months   Notes to clinic:  historical med     Requested Prescriptions  Pending Prescriptions Disp Refills   montelukast (SINGULAIR) 10 MG tablet      Sig: Take 1 tablet (10 mg total) by mouth every morning.     Pulmonology:  Leukotriene Inhibitors Passed - 08/09/2021  4:00 PM      Passed - Valid encounter within last 12 months    Recent Outpatient Visits           1 month ago Primary hypertension   [facility], Dionne Bucy, MD   4 months ago Morbid obesity [facility])   [facility], Dionne Bucy, MD   11 months ago Atrial tachycardia [facility])   [facility], Dionne Bucy, MD   1 year ago Need for influenza vaccination   [facility], Dionne Bucy, MD   1 year ago Mild intermittent asthma with acute exacerbation   [facility] Trinna Post, PA-C       Future Appointments             In 2 months Earlie Server, MD [place] [facility] [place]   In 2 months Bacigalupo, Dionne Bucy, MD [facility], [place]

## 2021-08-12 ENCOUNTER — Ambulatory Visit (INDEPENDENT_AMBULATORY_CARE_PROVIDER_SITE_OTHER): Payer: Medicare Other

## 2021-08-12 ENCOUNTER — Encounter: Payer: Self-pay | Admitting: Neurology

## 2021-08-12 ENCOUNTER — Encounter: Payer: Self-pay | Admitting: Oncology

## 2021-08-12 ENCOUNTER — Telehealth: Payer: Self-pay | Admitting: Neurology

## 2021-08-12 DIAGNOSIS — Z0000 Encounter for general adult medical examination without abnormal findings: Secondary | ICD-10-CM | POA: Diagnosis not present

## 2021-08-12 MED ORDER — MONTELUKAST SODIUM 10 MG PO TABS: 10.0000 mg | ORAL_TABLET | Freq: Every morning | ORAL | 0 refills | Status: DC

## 2021-08-12 NOTE — Patient Instructions (Signed)
Claire Strong , Thank you for taking time to come for your Medicare Wellness Visit. I appreciate your ongoing commitment to your health goals. Please review the following plan we discussed and let me know if I can assist you in the future.   Screening recommendations/referrals: Colonoscopy: 07/01/18 Mammogram: 04/24/21 Bone Density: 07/30/17 Recommended yearly ophthalmology/optometry visit for glaucoma screening and checkup Recommended yearly dental visit for hygiene and checkup  Vaccinations: Influenza vaccine: 04/30/21 Pneumococcal vaccine: n/d Tdap vaccine: 12/19/10 Shingles vaccine: Shingrix 10/27/16, 01/12/17  Covid-19: 09/04/19, 09/27/19, 04/30/20  Advanced directives: no  Conditions/risks identified: none  Next appointment: Follow up in one year for your annual wellness visit. 08/13/22 @ 1pm by phone  Preventive Care 40-64 Years, Female Preventive care refers to lifestyle choices and visits with your health care provider that can promote health and wellness. What does preventive care include? A yearly physical exam. This is also called an annual well check. Dental exams once or twice a year. Routine eye exams. Ask your health care provider how often you should have your eyes checked. Personal lifestyle choices, including: Daily care of your teeth and gums. Regular physical activity. Eating a healthy diet. Avoiding tobacco and drug use. Limiting alcohol use. Practicing safe sex. Taking low-dose aspirin daily starting at age 14. Taking vitamin and mineral supplements as recommended by your health care provider. What happens during an annual well check? The services and screenings done by your health care provider during your annual well check will depend on your age, overall health, lifestyle risk factors, and family history of disease. Counseling  Your health care provider may ask you questions about your: Alcohol use. Tobacco use. Drug use. Emotional well-being. Home and  relationship well-being. Sexual activity. Eating habits. Work and work Statistician. Method of birth control. Menstrual cycle. Pregnancy history. Screening  You may have the following tests or measurements: Height, weight, and BMI. Blood pressure. Lipid and cholesterol levels. These may be checked every 5 years, or more frequently if you are over 62 years old. Skin check. Lung cancer screening. You may have this screening every year starting at age 8 if you have a 30-pack-year history of smoking and currently smoke or have quit within the past 15 years. Fecal occult blood test (FOBT) of the stool. You may have this test every year starting at age 58. Flexible sigmoidoscopy or colonoscopy. You may have a sigmoidoscopy every 5 years or a colonoscopy every 10 years starting at age 22. Hepatitis C blood test. Hepatitis B blood test. Sexually transmitted disease (STD) testing. Diabetes screening. This is done by checking your blood sugar (glucose) after you have not eaten for a while (fasting). You may have this done every 1-3 years. Mammogram. This may be done every 1-2 years. Talk to your health care provider about when you should start having regular mammograms. This may depend on whether you have a family history of breast cancer. BRCA-related cancer screening. This may be done if you have a family history of breast, ovarian, tubal, or peritoneal cancers. Pelvic exam and Pap test. This may be done every 3 years starting at age 4. Starting at age 50, this may be done every 5 years if you have a Pap test in combination with an HPV test. Bone density scan. This is done to screen for osteoporosis. You may have this scan if you are at high risk for osteoporosis. Discuss your test results, treatment options, and if necessary, the need for more tests with your health care provider.  Vaccines  Your health care provider may recommend certain vaccines, such as: Influenza vaccine. This is recommended  every year. Tetanus, diphtheria, and acellular pertussis (Tdap, Td) vaccine. You may need a Td booster every 10 years. Zoster vaccine. You may need this after age 55. Pneumococcal 13-valent conjugate (PCV13) vaccine. You may need this if you have certain conditions and were not previously vaccinated. Pneumococcal polysaccharide (PPSV23) vaccine. You may need one or two doses if you smoke cigarettes or if you have certain conditions. Talk to your health care provider about which screenings and vaccines you need and how often you need them. This information is not intended to replace advice given to you by your health care provider. Make sure you discuss any questions you have with your health care provider. Document Released: 06/29/2015 Document Revised: 02/20/2016 Document Reviewed: 04/03/2015 Elsevier Interactive Patient Education  2017 [place] Prevention in the Home Falls can cause injuries. They can happen to people of all ages. There are many things you can do to make your home safe and to help prevent falls. What can I do on the outside of my home? Regularly fix the edges of walkways and driveways and fix any cracks. Remove anything that might make you trip as you walk through a door, such as a raised step or threshold. Trim any bushes or trees on the path to your home. Use bright outdoor lighting. Clear any walking paths of anything that might make someone trip, such as rocks or tools. Regularly check to see if handrails are loose or broken. Make sure that both sides of any steps have handrails. Any raised decks and porches should have guardrails on the edges. Have any leaves, snow, or ice cleared regularly. Use sand or salt on walking paths during winter. Clean up any spills in your garage right away. This includes oil or grease spills. What can I do in the bathroom? Use night lights. Install grab bars by the toilet and in the tub and shower. Do not use towel bars as  grab bars. Use non-skid mats or decals in the tub or shower. If you need to sit down in the shower, use a plastic, non-slip stool. Keep the floor dry. Clean up any water that spills on the floor as soon as it happens. Remove soap buildup in the tub or shower regularly. Attach bath mats securely with double-sided non-slip rug tape. Do not have throw rugs and other things on the floor that can make you trip. What can I do in the bedroom? Use night lights. Make sure that you have a light by your bed that is easy to reach. Do not use any sheets or blankets that are too big for your bed. They should not hang down onto the floor. Have a firm chair that has side arms. You can use this for support while you get dressed. Do not have throw rugs and other things on the floor that can make you trip. What can I do in the kitchen? Clean up any spills right away. Avoid walking on wet floors. Keep items that you use a lot in easy-to-reach places. If you need to reach something above you, use a strong step stool that has a grab bar. Keep electrical cords out of the way. Do not use floor polish or wax that makes floors slippery. If you must use wax, use non-skid floor wax. Do not have throw rugs and other things on the floor that can make you  trip. What can I do with my stairs? Do not leave any items on the stairs. Make sure that there are handrails on both sides of the stairs and use them. Fix handrails that are broken or loose. Make sure that handrails are as long as the stairways. Check any carpeting to make sure that it is firmly attached to the stairs. Fix any carpet that is loose or worn. Avoid having throw rugs at the top or bottom of the stairs. If you do have throw rugs, attach them to the floor with carpet tape. Make sure that you have a light switch at the top of the stairs and the bottom of the stairs. If you do not have them, ask someone to add them for you. What else can I do to help prevent  falls? Wear shoes that: Do not have high heels. Have rubber bottoms. Are comfortable and fit you well. Are closed at the toe. Do not wear sandals. If you use a stepladder: Make sure that it is fully opened. Do not climb a closed stepladder. Make sure that both sides of the stepladder are locked into place. Ask someone to hold it for you, if possible. Clearly mark and make sure that you can see: Any grab bars or handrails. First and last steps. Where the edge of each step is. Use tools that help you move around (mobility aids) if they are needed. These include: Canes. Walkers. Scooters. Crutches. Turn on the lights when you go into a dark area. Replace any light bulbs as soon as they burn out. Set up your furniture so you have a clear path. Avoid moving your furniture around. If any of your floors are uneven, fix them. If there are any pets around you, be aware of where they are. Review your medicines with your doctor. Some medicines can make you feel dizzy. This can increase your chance of falling. Ask your doctor what other things that you can do to help prevent falls. This information is not intended to replace advice given to you by your health care provider. Make sure you discuss any questions you have with your health care provider. Document Released: 03/29/2009 Document Revised: 11/08/2015 Document Reviewed: 07/07/2014 Elsevier Interactive Patient Education  2017 [employer].

## 2021-08-12 NOTE — Progress Notes (Signed)
Virtual Visit via Telephone Note  I connected with  Claire Strong on 08/12/21 at  1:00 PM EST by telephone and verified that I am speaking with the correct person using two identifiers.  Location: Patient: home Provider: [facility] Persons participating in the virtual visit: [place]   I discussed the limitations, risks, security and privacy concerns of performing an evaluation and management service by telephone and the availability of in person appointments. The patient expressed understanding and agreed to proceed.  Interactive audio and video telecommunications were attempted between this nurse and patient, however failed, due to patient having technical difficulties OR patient did not have access to video capability.  We continued and completed visit with audio only.  Some vital signs may be absent or patient reported.   Claire David, LPN  Subjective:   Claire Strong is a 61 y.o. female who presents for Medicare Annual (Subsequent) preventive examination.  Review of Systems           Objective:    There were no vitals filed for this visit. There is no height or weight on file to calculate BMI.  Advanced Directives 07/09/2021 12/26/2020 10/24/2020 10/03/2020 08/02/2020 06/19/2020 01/23/2020  Does Patient Have a Medical Advance Directive? No Yes Yes Yes Yes Yes Yes  Type of Advance Directive - Living will;Healthcare Power of [place];Living will [place];Living will Living will Living will [place];Living will  Does patient want to make changes to medical advance directive? No - Patient declined - No - Patient declined No - Patient declined No - Patient declined - -  Copy of [place] in Chart? - - Yes - validated most recent copy scanned in chart (See row information) No - copy requested - - No - copy requested  Would patient like information on creating a medical advance  directive? No - Patient declined - - - - - -    Current Medications (verified) Outpatient Encounter Medications as of 08/12/2021  Medication Sig   acetaminophen (TYLENOL) 500 MG tablet Take 1,000 mg by mouth daily at 8 pm.   albuterol (VENTOLIN HFA) 108 (90 Base) MCG/ACT inhaler Inhale 2 puffs into the lungs every 6 (six) hours as needed for wheezing or shortness of breath.   Apoaequorin (PREVAGEN EXTRA STRENGTH) 20 MG CAPS Take by mouth.   Ascorbic Acid (VITAMIN C) 100 MG tablet Take by mouth daily. Unsure dose   aspirin 81 MG tablet Take 81 mg by mouth daily.   benazepril (LOTENSIN) 10 MG tablet Take 1 tablet (10 mg total) by mouth daily. PLEASE CALL OFFICE TO SCHEDULE APPOINTMENT FOR FURTHER REFILLS.   bisacodyl (DULCOLAX) 5 MG EC tablet Take 5 mg by mouth daily.   buPROPion (WELLBUTRIN SR) 150 MG 12 hr tablet Take 1 tablet by mouth twice daily   calcium carbonate (CALCIUM 600) 600 MG TABS tablet Take 1 tablet (600 mg total) 2 (two) times daily with a meal by mouth.   cetirizine (ZYRTEC) 10 MG tablet Take 10 mg by mouth daily.   Cholecalciferol (VITAMIN D3) 1000 UNITS tablet Take 2,000 Units by mouth daily.   citalopram (CELEXA) 40 MG tablet Take 1 tablet by mouth once daily   cyclobenzaprine (FLEXERIL) 10 MG tablet Take 10 mg by mouth 3 (three) times daily as needed.    donepezil (ARICEPT) 10 MG tablet Take 1 tablet (10 mg total) by mouth at bedtime.   EPINEPHrine (EPI-PEN) 0.3 mg/0.3 mL  DEVI Inject 0.3 mLs (0.3 mg total) into the muscle once.   estradiol (ESTRACE) 2 MG tablet Take 1 tablet by mouth daily.    Fluticasone-Salmeterol (ADVAIR DISKUS) 100-50 MCG/DOSE AEPB Inhale 1 puff into the lungs 2 (two) times daily.   furosemide (LASIX) 40 MG tablet TAKE 1 TABLET BY MOUTH ONCE DAILY (PLEASE  ATTEND  UPCOMING  VISIT  PRIOR  TO  FURTHER  REFILLS)   levothyroxine (SYNTHROID) 175 MCG tablet Take 175 mcg by mouth daily.   metoprolol tartrate (LOPRESSOR) 25 MG tablet TAKE 1 TABLET BY MOUTH  TWICE DAILY.   montelukast (SINGULAIR) 10 MG tablet Take 1 tablet (10 mg total) by mouth every morning.   ondansetron (ZOFRAN) 4 MG tablet Take 1 tablet (4 mg total) by mouth every 8 (eight) hours as needed for nausea or vomiting.   ondansetron (ZOFRAN) 4 MG tablet Take 1 tablet (4 mg total) by mouth daily as needed for nausea or vomiting.   oxyCODONE-acetaminophen (PERCOCET) 5-325 MG tablet Take 1-2 tablets by mouth every 6 (six) hours as needed for severe pain. Max 6 tabs per day   pantoprazole (PROTONIX) 20 MG tablet Take 1 tablet by mouth twice daily   Potassium 95 MG TABS Take 1 tablet by mouth daily.   solifenacin (VESICARE) 5 MG tablet Take 5 mg by mouth daily.    SUMAtriptan (IMITREX) 50 MG tablet Take 50 mg by mouth every 2 (two) hours as needed for migraine. May repeat in 2 hours if headache persists or recurs.   vitamin B-12 (CYANOCOBALAMIN) 500 MCG tablet Take 1 tablet (500 mcg total) by mouth daily.   zinc gluconate 50 MG tablet Take 50 mg by mouth daily.   No facility-administered encounter medications on file as of 08/12/2021.    Allergies (verified) Elemental sulfur, Sulfa antibiotics, Cefuroxime axetil, Nutritional supplements, Other, Oxycodone-acetaminophen, Betadine [povidone iodine], and Latex   History: Past Medical History:  Diagnosis Date   Anxiety    Basal cell carcinoma    nose   Bleeding disorder (HCC)    Bronchial asthma    Cancer ([place])    melanoma; right knee   Cavovarus deformity of foot    bilateral, with L 5th bunionette (Dr. Gigi Gin ortho)   Chronic venous insufficiency 2007   s/p vein stripping and laser ablation   Complication of anesthesia    Depression    Diverticulosis    mild by colonoscopy   Headache    Migraines   History of DVT (deep vein thrombosis)    DVTs after 1st pregnancy, not on AC 2/2 bleeding ulcer, greenfield filter in place   History of gastric ulcer 2009   History of kidney stones    Horseshoe kidney    sole, R kidney  damage   HTN (hypertension)    Hx of degenerative disc disease    Hypothyroidism    Kidney stones    s/p renal hematoma after lithotripsy on right   Lupus anticoagulant disorder ([place])    Multinodular goiter    s/p thyroidectomy   Nasal septal perforation    chronic, ENT rec avoid antihistamine, INS   Osteoarthritis    ?FM by rheum   Overweight    PONV (postoperative nausea and vomiting)    Post-surgical hypothyroidism    for multinodular goiter   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 Frutoso Chase   Seasonal allergies    allergy shots, singulair   Sight deterioration    disc in back   Sleep apnea  lost weight   Tachycardia    Past Surgical History:  Procedure Laterality Date   ABDOMINAL HERNIA REPAIR     ABDOMINAL HYSTERECTOMY     ANKLE FUSION Left 05/2013   APPENDECTOMY  1978   BACK SURGERY  2009   CESAREAN SECTION     X 2   CHOLECYSTECTOMY  1998   COLONOSCOPY  05/2012   hyperplastic polyps x2, mild diverticulosis Deatra Ina) rpt 10 yrs   COLONOSCOPY WITH PROPOFOL N/A 07/01/2018   Procedure: COLONOSCOPY WITH PROPOFOL;  Surgeon: Virgel Manifold, MD;  Location: [facility];  Service: Endoscopy;  Laterality: N/A;   CYST EXCISION     back  and shoulder   ESI Right 10/2015, 02/2016   C4/5 then C5/6 ESI ([place])   ESI Bilateral 06/2015, 08/2015, 03/2016, 09/2017   L4/5 transforaminal ESI ([place])   ESOPHAGOGASTRODUODENOSCOPY (EGD) WITH PROPOFOL N/A 07/01/2018   Procedure: ESOPHAGOGASTRODUODENOSCOPY (EGD) WITH PROPOFOL;  Surgeon: Virgel Manifold, MD;  Location: [facility];  Service: Endoscopy;  Laterality: N/A;   FL HIP INJECTION ([place] HX) Left 11/2016   Dr Sharlet Salina   FUSION OF TALONAVICULAR JOINT Left 09/12/2016   Procedure: FUSION OF TALONAVICULAR JOINT WITH TRINITY BONE GRAFT;  Surgeon: Samara Deist, DPM;  Location: [facility];  Service: Podiatry;  Laterality: Left;   GASTRIC BYPASS  05/21/05   Dr. Frutoso Chase, Roux-en-Y   Greenfield filter removal  2015   removed 6 wks  after surgery   HAMMER TOE SURGERY Left 10/03/2020   Procedure: HAMMER TOE CORRECTION 2,3,4 AND Z - PLASTY LEFT;  Surgeon: Samara Deist, DPM;  Location: [place];  Service: Podiatry;  Laterality: Left;   hammerhead toes Left    HERNIA REPAIR     X 3   INCISION AND DRAINAGE OF WOUND Left 10/24/2020   Procedure: IRRIGATION AND DEBRIDEMENT; BELOW FASCIA FOOT; MULTIPLE;  Surgeon: Samara Deist, DPM;  Location: [place];  Service: Podiatry;  Laterality: Left;  IV with Local   IVC FILTER INSERTION N/A 09/09/2016   Procedure: IVC Filter Insertion;  Surgeon: Katha Cabal, MD;  Location: [place] [facility];  Service: Cardiovascular;  Laterality: N/A;   IVC FILTER REMOVAL N/A 11/25/2016   Procedure: IVC Filter Removal;  Surgeon: Katha Cabal, MD;  Location: [place] [facility];  Service: Cardiovascular;  Laterality: N/A;   JOINT REPLACEMENT     lyphoma excision  08/2015   arm   NASAL SEPTUM SURGERY  1981   deviated septum, repaired at [facility]   ovaries remain   SCAR REVISION N/A 08/02/2020   Procedure: ADVANCEMENT FLAP SCAR REVISION;  Surgeon: Margaretha Sheffield, MD;  Location: [place];  Service: ENT;  Laterality: N/A;   SKIN SURGERY     basel cell   [place] Right 11/06/2015   Procedure: TOTAL HIP ARTHROPLASTY ANTERIOR APPROACH;  Surgeon: Hessie Knows, MD;  Location: [facility];  Service: Orthopedics;  Laterality: Right;   TOTAL HIP ARTHROPLASTY Left 03/19/2017   Procedure: TOTAL HIP ARTHROPLASTY ANTERIOR APPROACH;  Surgeon: Hessie Knows, MD;  Location: [facility];  Service: Orthopedics;  Laterality: Left;   TOTAL KNEE ARTHROPLASTY Left 08/2013   TOTAL THYROIDECTOMY  09/2011   benign path (done for multinodular goiter concern for cancer)   WEIL OSTEOTOMY Left 10/03/2020   Procedure: WEIL OSTEOTOMY 3RD LEFT;  Surgeon: Samara Deist, DPM;  Location: [place];  Service: Podiatry;   Laterality: Left;   Family  History  Problem Relation Age of Onset   Diabetes Mother        prediabetes   Alzheimer's disease Mother    Hyperlipidemia Mother    Diabetes Father    Coronary artery disease Father 87       CABG   Alcohol abuse Father    Lung cancer Father    Brain cancer Maternal Uncle    Diabetes Paternal Grandmother    Lung cancer Brother    Alzheimer's disease Brother    Diabetes Maternal Grandmother    Stroke Neg Hx    Colon cancer Neg Hx    Stomach cancer Neg Hx    Kidney disease Neg Hx    Breast cancer Neg Hx    Kidney cancer Neg Hx    Bladder Cancer Neg Hx    Ovarian cancer Neg Hx    Social History   Socioeconomic History   Marital status: Married    Spouse name: Gwyndolyn Saxon   Number of children: 2   Years of education: 12   Highest education level: High school graduate  Occupational History   Occupation: stay at home  Tobacco Use   Smoking status: Former    Packs/day: 0.25    Types: Cigarettes    Quit date: 06/16/2004    Years since quitting: 17.1   Smokeless tobacco: Never   Tobacco comments:    Socially-no longer  Vaping Use   Vaping Use: Never used  Substance and Sexual Activity   Alcohol use: Yes    Alcohol/week: 0.0 standard drinks    Comment: Occasionally-beer or bloody mary monthly   Drug use: No   Sexual activity: Yes    Partners: Male    Birth control/protection: Surgical  Other Topics Concern   Not on file  Social History Narrative   Not on file   Social Determinants of Health   Financial Resource Strain: Not on file  Food Insecurity: Not on file  Transportation Needs: Not on file  Physical Activity: Not on file  Stress: Not on file  Social Connections: Not on file    Tobacco Counseling Counseling given: Not Answered Tobacco comments: Socially-no longer   Clinical Intake:  Pre-visit preparation completed: Yes  Pain : No/denies pain     Nutritional Risks: None Diabetes: No  How often do you need to have  someone help you when you read instructions, pamphlets, or other written materials from your doctor or pharmacy?: 3 - Sometimes  Diabetic?no  Interpreter Needed?: No  Information entered by :: Kirke Shaggy, LPN   Activities of Daily Living In your present state of health, do you have any difficulty performing the following activities: 08/10/2021 08/08/2021  Hearing? N N  Vision? N N  Difficulty concentrating or making decisions? Tempie Donning  Walking or climbing stairs? Y Y  Dressing or bathing? N N  Doing errands, shopping? Tempie Donning  Preparing Food and eating ? N N  Using the Toilet? N N  In the past six months, have you accidently leaked urine? Y Y  Do you have problems with loss of bowel control? N N  Managing your Medications? N N  Managing your Finances? N N  Housekeeping or managing your Housekeeping? N N  Some recent data might be hidden    Patient Care Team: Bacigalupo, Dionne Bucy, MD as PCP - General (Family Medicine) Minna Merritts, MD as PCP - Cardiology (Cardiology) Samara Deist, DPM as Referring Physician (Podiatry) Schnier, Dolores Lory, MD (Vascular Surgery) Hessie Knows,  MD as Consulting Physician (Orthopedic Surgery) Earlie Server, MD as Consulting Physician (Hematology and Oncology) Gabriel Carina Betsey Holiday, MD as Consulting Physician (Endocrinology)  Indicate any recent Medical Services you may have received from other than [facility] providers in the past year (date may be approximate).     Assessment:   This is a routine wellness examination for Claire Strong.  Hearing/Vision screen No results found.  Dietary issues and exercise activities discussed:     Goals Addressed   None    Depression Screen PHQ 2/9 Scores 07/08/2021 04/02/2021 09/06/2020 01/23/2020 01/20/2020 07/22/2019 01/19/2019  PHQ - 2 Score 0 4 2 2 2 2 4   PHQ- 9 Score 8 18 9 5 10 10 12     Fall Risk Fall Risk  08/10/2021 08/08/2021 04/02/2021 09/06/2020 01/23/2020  Falls in the past year? 1 1 0 0 1  Number falls in past yr: 1 1 0 0 1   Comment - - - - -  Injury with Fall? 1 1 0 0 0  Risk for fall due to : - - No Fall Risks No Fall Risks Impaired mobility  Follow up - - Falls evaluation completed Falls evaluation completed -    FALL RISK PREVENTION PERTAINING TO THE HOME:  Any stairs in or around the home? Yes  If so, are there any without handrails? No  Home free of loose throw rugs in walkways, pet beds, electrical cords, etc? Yes  Adequate lighting in your home to reduce risk of falls? Yes   ASSISTIVE DEVICES UTILIZED TO PREVENT FALLS:  Life alert? Yes  Use of a cane, walker or w/c? No  Grab bars in the bathroom? No  Shower chair or bench in shower? Yes  Elevated toilet seat or a handicapped toilet? Yes    Cognitive Function: MMSE - Mini Mental State Exam 08/08/2021  Orientation to time 5  Orientation to Place 5  Registration 3  Attention/ Calculation 3  Recall 1  Language- name 2 objects 2  Language- repeat 1  Language- follow 3 step command 3  Language- read & follow direction 1  Write a sentence 1  Copy design 1  Total score 26        Immunizations Immunization History  Administered Date(s) Administered   Influenza Split 03/18/2011, 03/31/2012   Influenza,inj,Quad PF,6+ Mos 03/18/2013, 04/04/2014, 04/30/2017, 02/03/2018, 03/10/2019, 04/30/2020   Influenza-Unspecified 03/18/2013, 04/04/2014, 04/30/2017, 02/03/2018, 04/30/2020, 04/30/2021   Moderna Sars-Covid-2 Vaccination 09/04/2019, 09/27/2019, 04/30/2020   Tdap 12/19/2010   Zoster Recombinat (Shingrix) 10/27/2016, 01/12/2017    TDAP status: Due, Education has been provided regarding the importance of this vaccine. Advised may receive this vaccine at local pharmacy or Health Dept. Aware to provide a copy of the vaccination record if obtained from local pharmacy or Health Dept. Verbalized acceptance and understanding.  Flu Vaccine status: Up to date  Pneumococcal vaccine status: Declined,  Education has been provided regarding the  importance of this vaccine but patient still declined. Advised may receive this vaccine at local pharmacy or Health Dept. Aware to provide a copy of the vaccination record if obtained from local pharmacy or Health Dept. Verbalized acceptance and understanding.   Covid-19 vaccine status: Completed vaccines  Qualifies for Shingles Vaccine? Yes   Zostavax completed No   Shingrix Completed?: Yes  Screening Tests Health Maintenance  Topic Date Due   HIV Screening  Never done   COVID-19 Vaccine (4 - Booster) 06/25/2020   TETANUS/TDAP  12/18/2020   COLONOSCOPY (Pts 45-85yrs Insurance coverage will need to  be confirmed)  07/01/2021   MAMMOGRAM  04/24/2022   INFLUENZA VACCINE  Completed   Hepatitis C Screening  Completed   Zoster Vaccines- Shingrix  Completed   HPV VACCINES  Aged Out    Health Maintenance  Health Maintenance Due  Topic Date Due   HIV Screening  Never done   COVID-19 Vaccine (4 - Booster) 06/25/2020   TETANUS/TDAP  12/18/2020   COLONOSCOPY (Pts 45-42yrs Insurance coverage will need to be confirmed)  07/01/2021    Colorectal cancer screening: Type of screening: Colonoscopy. Completed 07/01/18. Repeat every 5 years  Mammogram status: Completed 04/24/21. Repeat every year  Bone Density status: Completed 07/30/17. Results reflect: Bone density results: NORMAL. Repeat every 5 years.  Lung Cancer Screening: (Low Dose CT Chest recommended if Age 6-80 years, 30 pack-year currently smoking OR have quit w/in 15years.) does not qualify.    Additional Screening:  Hepatitis C Screening: does qualify; Completed 09/12/15  Vision Screening: Recommended annual ophthalmology exams for early detection of glaucoma and other disorders of the eye. Is the patient up to date with their annual eye exam?  Yes  Who is the provider or what is the name of the office in which the patient attends annual eye exams? Dr.Shade If pt is not established with a provider, would they like to be referred  to a provider to establish care? No .   Dental Screening: Recommended annual dental exams for proper oral hygiene  Community Resource Referral / Chronic Care Management: CRR required this visit?  No   CCM required this visit?  No      Plan:     I have personally reviewed and noted the following in the patients chart:   Medical and social history Use of alcohol, tobacco or illicit drugs  Current medications and supplements including opioid prescriptions.  Functional ability and status Nutritional status Physical activity Advanced directives List of other physicians Hospitalizations, surgeries, and ER visits in previous 12 months Vitals Screenings to include cognitive, depression, and falls Referrals and appointments  In addition, I have reviewed and discussed with patient certain preventive protocols, quality metrics, and best practice recommendations. A written personalized care plan for preventive services as well as general preventive health recommendations were provided to patient.     Claire David, LPN   01/16/2335   Nurse Notes: none

## 2021-08-12 NOTE — Telephone Encounter (Signed)
Referral sent to [facility].

## 2021-08-13 MED ORDER — COPPER CAPS 2 MG PO CAPS: 2.0000 mg | ORAL_CAPSULE | Freq: Every day | ORAL | 3 refills | Status: AC

## 2021-08-13 NOTE — Addendum Note (Signed)
Addended byAlric Ran on: 08/13/2021 08:52 AM   Modules accepted: Orders

## 2021-08-19 ENCOUNTER — Encounter: Payer: Self-pay | Admitting: Neurology

## 2021-08-22 LAB — METHYLMALONIC ACID, SERUM: Methylmalonic Acid: 163 nmol/L (ref 0–378)

## 2021-08-22 LAB — DEMENTIA PANEL
Homocysteine: 10.2 umol/L (ref 0.0–14.5)
RPR Ser Ql: NONREACTIVE
TSH: 0.086 u[IU]/mL — ABNORMAL LOW (ref 0.450–4.500)
Vitamin B-12: 788 pg/mL (ref 232–1245)

## 2021-08-22 LAB — COPPER, SERUM: Copper: 18 ug/dL — ABNORMAL LOW (ref 80–158)

## 2021-08-23 ENCOUNTER — Other Ambulatory Visit: Payer: Self-pay | Admitting: Cardiovascular Disease

## 2021-08-23 ENCOUNTER — Other Ambulatory Visit: Payer: Self-pay | Admitting: Family Medicine

## 2021-08-23 NOTE — Telephone Encounter (Signed)
Please contact pt for future appointment. ?Pt due for 12 month f/u. ?Pt needing refills. ?

## 2021-08-23 NOTE — Telephone Encounter (Signed)
LVM to schedule

## 2021-08-23 NOTE — Telephone Encounter (Signed)
Requested Prescriptions  ?Pending Prescriptions Disp Refills  ?? citalopram (CELEXA) 40 MG tablet [Pharmacy Med Name: Citalopram Hydrobromide 40 MG Oral Tablet] 90 tablet 0  ?  Sig: Take 1 tablet by mouth once daily  ?  ? Psychiatry:  Antidepressants - SSRI Passed - 08/23/2021 11:34 AM  ?  ?  Passed - Completed PHQ-2 or PHQ-9 in the last 360 days  ?  ?  Passed - Valid encounter within last 6 months  ?  Recent Outpatient Visits   ?      ? 1 month ago Primary hypertension  ? [facility] [place], Dionne Bucy, MD  ? 4 months ago Morbid obesity [facility] [place])  ? [place], [place]  ? 11 months ago Atrial tachycardia [facility])  ? [facility] [place], Dionne Bucy, MD  ? 1 year ago Need for influenza vaccination  ? [facility] [place], Dionne Bucy, MD  ? 1 year ago Mild intermittent asthma with acute exacerbation  ? [facility] [place], [place], [place]  ?  ?  ?Future Appointments   ?        ? In 1 month Earlie Server, MD [facility]  ? In 2 months Bacigalupo, Dionne Bucy, MD [facility]  ?  ? ?  ?  ?  ? ? ?

## 2021-08-29 NOTE — Telephone Encounter (Signed)
Referral sent to [facility] [phone]. ?

## 2021-09-04 NOTE — Telephone Encounter (Signed)
Patient is scheduled at [facility]: ? ?Intake/Interview: 09/30/2021 ?Testing: 09/30/2021 ?Feedback: 10/23/2021  ?

## 2021-09-06 DIAGNOSIS — H2513 Age-related nuclear cataract, bilateral: Secondary | ICD-10-CM | POA: Diagnosis not present

## 2021-09-06 DIAGNOSIS — I10 Essential (primary) hypertension: Secondary | ICD-10-CM | POA: Diagnosis not present

## 2021-09-14 DIAGNOSIS — F0390 Unspecified dementia without behavioral disturbance: Secondary | ICD-10-CM

## 2021-09-14 HISTORY — DX: Unspecified dementia, unspecified severity, without behavioral disturbance, psychotic disturbance, mood disturbance, and anxiety: F03.90

## 2021-09-16 ENCOUNTER — Telehealth: Payer: Self-pay | Admitting: Cardiovascular Disease

## 2021-09-16 NOTE — Telephone Encounter (Signed)
LVM to schedule

## 2021-09-26 NOTE — Telephone Encounter (Signed)
Duplicate encounter

## 2021-09-26 NOTE — Telephone Encounter (Signed)
September 16, 2021 ?  ? ?PH ?   2:25 PM ? ?  ?[place] ?   2:25 PM ?Note ?LVM to schedule  ?  ? ?  ? ?

## 2021-09-26 NOTE — Telephone Encounter (Signed)
Lmov  ? ? ?3 attempts to schedule fu appt from recall list.   Deleting recall.  ? ?

## 2021-09-27 NOTE — Telephone Encounter (Signed)
Noted  

## 2021-10-03 ENCOUNTER — Other Ambulatory Visit: Payer: Self-pay | Admitting: Neurology

## 2021-10-03 ENCOUNTER — Telehealth: Payer: Self-pay | Admitting: Neurology

## 2021-10-03 DIAGNOSIS — G309 Alzheimer's disease, unspecified: Secondary | ICD-10-CM

## 2021-10-03 DIAGNOSIS — F0280 Dementia in other diseases classified elsewhere without behavioral disturbance: Secondary | ICD-10-CM

## 2021-10-03 DIAGNOSIS — R4189 Other symptoms and signs involving cognitive functions and awareness: Secondary | ICD-10-CM

## 2021-10-03 NOTE — Telephone Encounter (Signed)
I spoke to the patient's dgt. She is aware to expect a call for MRI scheduling. ?

## 2021-10-03 NOTE — Telephone Encounter (Signed)
MRI brain ordered.

## 2021-10-03 NOTE — Progress Notes (Signed)
Spoke with daughter, they had the neuropsych testing done yesterday and patient was diagnosed with frontotemporal dementia. Provider is requesting repeat MRI brain to r/o intracranial pathology. Will order MRI Brain without contrast  ? ?Thank you  ?Alric Ran, MD ?

## 2021-10-03 NOTE — Telephone Encounter (Signed)
Pt's daughter has called to report that pt met with the Neuropsychologist, it was determined that pt has frontal lobe Dementia and it was suggested that pt come back here for a MRI to be done to see if there has been any changes, please call pt's daughter so order can be submitted for MRI. ?

## 2021-10-07 ENCOUNTER — Telehealth: Payer: Self-pay | Admitting: Neurology

## 2021-10-07 NOTE — Telephone Encounter (Signed)
[employer] medicare order sent to [facility], [facility] they will reach out to the patient to schedule.  

## 2021-10-08 ENCOUNTER — Other Ambulatory Visit: Payer: Self-pay

## 2021-10-08 ENCOUNTER — Inpatient Hospital Stay: Payer: Medicare Other | Attending: Oncology

## 2021-10-08 ENCOUNTER — Other Ambulatory Visit: Payer: Self-pay | Admitting: Cardiovascular Disease

## 2021-10-08 ENCOUNTER — Other Ambulatory Visit: Payer: Self-pay | Admitting: Family Medicine

## 2021-10-08 DIAGNOSIS — Z808 Family history of malignant neoplasm of other organs or systems: Secondary | ICD-10-CM | POA: Diagnosis not present

## 2021-10-08 DIAGNOSIS — Z79899 Other long term (current) drug therapy: Secondary | ICD-10-CM | POA: Insufficient documentation

## 2021-10-08 DIAGNOSIS — F0394 Unspecified dementia, unspecified severity, with anxiety: Secondary | ICD-10-CM | POA: Insufficient documentation

## 2021-10-08 DIAGNOSIS — D508 Other iron deficiency anemias: Secondary | ICD-10-CM

## 2021-10-08 DIAGNOSIS — Z885 Allergy status to narcotic agent status: Secondary | ICD-10-CM | POA: Diagnosis not present

## 2021-10-08 DIAGNOSIS — E890 Postprocedural hypothyroidism: Secondary | ICD-10-CM | POA: Insufficient documentation

## 2021-10-08 DIAGNOSIS — E538 Deficiency of other specified B group vitamins: Secondary | ICD-10-CM | POA: Diagnosis not present

## 2021-10-08 DIAGNOSIS — Z8711 Personal history of peptic ulcer disease: Secondary | ICD-10-CM | POA: Diagnosis not present

## 2021-10-08 DIAGNOSIS — Z8349 Family history of other endocrine, nutritional and metabolic diseases: Secondary | ICD-10-CM | POA: Insufficient documentation

## 2021-10-08 DIAGNOSIS — Z818 Family history of other mental and behavioral disorders: Secondary | ICD-10-CM | POA: Insufficient documentation

## 2021-10-08 DIAGNOSIS — Z9884 Bariatric surgery status: Secondary | ICD-10-CM | POA: Insufficient documentation

## 2021-10-08 DIAGNOSIS — Z811 Family history of alcohol abuse and dependence: Secondary | ICD-10-CM | POA: Diagnosis not present

## 2021-10-08 DIAGNOSIS — Z8249 Family history of ischemic heart disease and other diseases of the circulatory system: Secondary | ICD-10-CM | POA: Insufficient documentation

## 2021-10-08 DIAGNOSIS — Z87442 Personal history of urinary calculi: Secondary | ICD-10-CM | POA: Insufficient documentation

## 2021-10-08 DIAGNOSIS — Z7951 Long term (current) use of inhaled steroids: Secondary | ICD-10-CM | POA: Diagnosis not present

## 2021-10-08 DIAGNOSIS — Z833 Family history of diabetes mellitus: Secondary | ICD-10-CM | POA: Insufficient documentation

## 2021-10-08 DIAGNOSIS — Z86718 Personal history of other venous thrombosis and embolism: Secondary | ICD-10-CM | POA: Insufficient documentation

## 2021-10-08 DIAGNOSIS — Z85820 Personal history of malignant melanoma of skin: Secondary | ICD-10-CM | POA: Insufficient documentation

## 2021-10-08 DIAGNOSIS — I10 Essential (primary) hypertension: Secondary | ICD-10-CM | POA: Diagnosis not present

## 2021-10-08 DIAGNOSIS — I471 Supraventricular tachycardia: Secondary | ICD-10-CM

## 2021-10-08 DIAGNOSIS — Z882 Allergy status to sulfonamides status: Secondary | ICD-10-CM | POA: Insufficient documentation

## 2021-10-08 DIAGNOSIS — Z801 Family history of malignant neoplasm of trachea, bronchus and lung: Secondary | ICD-10-CM | POA: Insufficient documentation

## 2021-10-08 DIAGNOSIS — Z9049 Acquired absence of other specified parts of digestive tract: Secondary | ICD-10-CM | POA: Diagnosis not present

## 2021-10-08 DIAGNOSIS — F0393 Unspecified dementia, unspecified severity, with mood disturbance: Secondary | ICD-10-CM | POA: Insufficient documentation

## 2021-10-08 DIAGNOSIS — Z87891 Personal history of nicotine dependence: Secondary | ICD-10-CM | POA: Diagnosis not present

## 2021-10-08 LAB — CBC WITH DIFFERENTIAL/PLATELET
Abs Immature Granulocytes: 0.01 10*3/uL (ref 0.00–0.07)
Basophils Absolute: 0 10*3/uL (ref 0.0–0.1)
Basophils Relative: 0 %
Eosinophils Absolute: 0.1 10*3/uL (ref 0.0–0.5)
Eosinophils Relative: 2 %
HCT: 41.9 % (ref 36.0–46.0)
Hemoglobin: 13.9 g/dL (ref 12.0–15.0)
Immature Granulocytes: 0 %
Lymphocytes Relative: 29 %
Lymphs Abs: 1.7 10*3/uL (ref 0.7–4.0)
MCH: 29.5 pg (ref 26.0–34.0)
MCHC: 33.2 g/dL (ref 30.0–36.0)
MCV: 89 fL (ref 80.0–100.0)
Monocytes Absolute: 0.3 10*3/uL (ref 0.1–1.0)
Monocytes Relative: 6 %
Neutro Abs: 3.7 10*3/uL (ref 1.7–7.7)
Neutrophils Relative %: 63 %
Platelets: 218 10*3/uL (ref 150–400)
RBC: 4.71 MIL/uL (ref 3.87–5.11)
RDW: 11.8 % (ref 11.5–15.5)
WBC: 5.8 10*3/uL (ref 4.0–10.5)
nRBC: 0 % (ref 0.0–0.2)

## 2021-10-08 LAB — IRON AND TIBC
Iron: 117 ug/dL (ref 28–170)
Saturation Ratios: 29 % (ref 10.4–31.8)
TIBC: 403 ug/dL (ref 250–450)
UIBC: 286 ug/dL

## 2021-10-08 LAB — VITAMIN B12: Vitamin B-12: 565 pg/mL (ref 180–914)

## 2021-10-08 LAB — FERRITIN: Ferritin: 13 ng/mL (ref 11–307)

## 2021-10-08 LAB — GLUCOSE, RANDOM: Glucose, Bld: 104 mg/dL — ABNORMAL HIGH (ref 70–99)

## 2021-10-09 ENCOUNTER — Inpatient Hospital Stay (HOSPITAL_BASED_OUTPATIENT_CLINIC_OR_DEPARTMENT_OTHER): Payer: Medicare Other | Admitting: Oncology

## 2021-10-09 ENCOUNTER — Encounter: Payer: Self-pay | Admitting: Oncology

## 2021-10-09 DIAGNOSIS — E890 Postprocedural hypothyroidism: Secondary | ICD-10-CM | POA: Diagnosis not present

## 2021-10-09 DIAGNOSIS — F0394 Unspecified dementia, unspecified severity, with anxiety: Secondary | ICD-10-CM | POA: Diagnosis not present

## 2021-10-09 DIAGNOSIS — F0393 Unspecified dementia, unspecified severity, with mood disturbance: Secondary | ICD-10-CM | POA: Diagnosis not present

## 2021-10-09 DIAGNOSIS — Z8249 Family history of ischemic heart disease and other diseases of the circulatory system: Secondary | ICD-10-CM | POA: Diagnosis not present

## 2021-10-09 DIAGNOSIS — Z8711 Personal history of peptic ulcer disease: Secondary | ICD-10-CM | POA: Diagnosis not present

## 2021-10-09 DIAGNOSIS — Z808 Family history of malignant neoplasm of other organs or systems: Secondary | ICD-10-CM | POA: Diagnosis not present

## 2021-10-09 DIAGNOSIS — D508 Other iron deficiency anemias: Secondary | ICD-10-CM | POA: Diagnosis not present

## 2021-10-09 DIAGNOSIS — Z882 Allergy status to sulfonamides status: Secondary | ICD-10-CM | POA: Diagnosis not present

## 2021-10-09 DIAGNOSIS — Z87442 Personal history of urinary calculi: Secondary | ICD-10-CM | POA: Diagnosis not present

## 2021-10-09 DIAGNOSIS — Z833 Family history of diabetes mellitus: Secondary | ICD-10-CM | POA: Diagnosis not present

## 2021-10-09 DIAGNOSIS — Z9884 Bariatric surgery status: Secondary | ICD-10-CM

## 2021-10-09 DIAGNOSIS — I10 Essential (primary) hypertension: Secondary | ICD-10-CM | POA: Diagnosis not present

## 2021-10-09 DIAGNOSIS — Z801 Family history of malignant neoplasm of trachea, bronchus and lung: Secondary | ICD-10-CM | POA: Diagnosis not present

## 2021-10-09 DIAGNOSIS — Z8349 Family history of other endocrine, nutritional and metabolic diseases: Secondary | ICD-10-CM | POA: Diagnosis not present

## 2021-10-09 DIAGNOSIS — Z79899 Other long term (current) drug therapy: Secondary | ICD-10-CM | POA: Diagnosis not present

## 2021-10-09 DIAGNOSIS — Z885 Allergy status to narcotic agent status: Secondary | ICD-10-CM | POA: Diagnosis not present

## 2021-10-09 DIAGNOSIS — Z7951 Long term (current) use of inhaled steroids: Secondary | ICD-10-CM | POA: Diagnosis not present

## 2021-10-09 DIAGNOSIS — Z9049 Acquired absence of other specified parts of digestive tract: Secondary | ICD-10-CM | POA: Diagnosis not present

## 2021-10-09 DIAGNOSIS — Z87891 Personal history of nicotine dependence: Secondary | ICD-10-CM | POA: Diagnosis not present

## 2021-10-09 DIAGNOSIS — Z85820 Personal history of malignant melanoma of skin: Secondary | ICD-10-CM | POA: Diagnosis not present

## 2021-10-09 DIAGNOSIS — Z86718 Personal history of other venous thrombosis and embolism: Secondary | ICD-10-CM | POA: Diagnosis not present

## 2021-10-09 DIAGNOSIS — E538 Deficiency of other specified B group vitamins: Secondary | ICD-10-CM | POA: Diagnosis not present

## 2021-10-09 LAB — COPPER, SERUM: Copper: 191 ug/dL — ABNORMAL HIGH (ref 80–158)

## 2021-10-09 MED ORDER — VITAMIN B-12 1000 MCG SL SUBL: 1.0000 | SUBLINGUAL_TABLET | Freq: Every day | SUBLINGUAL | 1 refills | Status: DC

## 2021-10-09 NOTE — Progress Notes (Signed)
HEMATOLOGY-ONCOLOGY TeleHEALTH VISIT PROGRESS NOTE  ?I connected with Claire Strong on 10/09/21  at  2:30 PM EDT by video enabled telemedicine visit and verified that I am speaking with the correct person using two identifiers. ?I discussed the limitations, risks, security and privacy concerns of performing an evaluation and management service by telemedicine and the availability of in-person appointments. The patient expressed understanding and agreed to proceed.  ? ?Other persons participating in the visit and their role in the encounter:  ?Patient's family member ? ?Patient's location: Home  ?Provider's location: office ?Chief Complaint: History of gastric bypass iron deficiency anemia ? ? ?INTERVAL HISTORY ?Claire Strong is a 60 y.o. female who has above history reviewed by me today presents for follow up visit for management of iron deficiency anemia, vitamin B12 deficiency.  History of gastric bypass ?Recent new diagnosis of dementia. ?Otherwise no new complaints.  She reports feeling well. ? ?Review of Systems  ?Unable to perform ROS: Dementia  ?Constitutional:  Negative for appetite change, fatigue and unexpected weight change.  ?HENT:   Negative for hearing loss.   ?Respiratory:  Negative for shortness of breath.   ?Gastrointestinal:  Negative for abdominal pain.  ?Genitourinary:  Negative for dysuria.   ?Skin:  Negative for rash.   ?Past Medical History:  ?Diagnosis Date  ? Anxiety   ? Basal cell carcinoma   ? nose  ? Bleeding disorder ([place])   ? Bronchial asthma   ? Cancer [facility])   ? melanoma; right knee  ? Cavovarus deformity of foot   ? bilateral, with L 5th bunionette (Dr. Gigi Gin ortho)  ? Chronic venous insufficiency 2007  ? s/p vein stripping and laser ablation  ? Complication of anesthesia   ? Depression   ? Diverticulosis   ? mild by colonoscopy  ? Headache   ? Migraines  ? History of DVT (deep vein thrombosis)   ? DVTs after 1st pregnancy, not on AC 2/2 bleeding ulcer, greenfield filter in  place  ? History of gastric ulcer 2009  ? History of kidney stones   ? 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 HTN (hypertension)   ? Hx of degenerative disc disease   ? Hypothyroidism   ? Kidney stones   ? s/p renal hematoma after lithotripsy on right  ? Lupus anticoagulant disorder ([place])   ? Multinodular goiter   ? s/p thyroidectomy  ? Nasal septal perforation   ? chronic, ENT rec avoid antihistamine, INS  ? Osteoarthritis   ? ?FM by rheum  ? Overweight   ? PONV (postoperative nausea and vomiting)   ? Post-surgical hypothyroidism   ? for multinodular goiter  ? 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 Dr. Frutoso Chase  ? Seasonal allergies   ? allergy shots, singulair  ? Sight deterioration   ? disc in back  ? Sleep apnea   ? lost weight  ? Tachycardia   ? ?Past Surgical History:  ?Procedure Laterality Date  ? ABDOMINAL HERNIA REPAIR    ? ABDOMINAL HYSTERECTOMY    ? ANKLE FUSION Left 05/2013  ? APPENDECTOMY  1978  ? BACK SURGERY  2009  ? CESAREAN SECTION    ? X 2  ? CHOLECYSTECTOMY  1998  ? COLONOSCOPY  05/2012  ? hyperplastic polyps x2, mild diverticulosis Deatra Ina) rpt 10 yrs  ? COLONOSCOPY WITH PROPOFOL N/A 07/01/2018  ? Procedure: COLONOSCOPY WITH PROPOFOL;  Surgeon: Virgel Manifold, MD;  Location: [facility];  Service: Endoscopy;  Laterality: N/A;  ? CYST EXCISION    ? back  and shoulder  ? ESI Right 10/2015, 02/2016  ? C4/5 then C5/6 ESI ([place])  ? ESI Bilateral 06/2015, 08/2015, 03/2016, 09/2017  ? L4/5 transforaminal ESI ([place])  ? ESOPHAGOGASTRODUODENOSCOPY (EGD) WITH PROPOFOL N/A 07/01/2018  ? Procedure: ESOPHAGOGASTRODUODENOSCOPY (EGD) WITH PROPOFOL;  Surgeon: Virgel Manifold, MD;  Location: [facility];  Service: Endoscopy;  Laterality: N/A;  ? FL HIP INJECTION ([place] [facility]) Left 11/2016  ? Dr Sharlet Salina  ? FUSION OF TALONAVICULAR JOINT Left 09/12/2016  ? Procedure: FUSION OF TALONAVICULAR JOINT WITH TRINITY BONE GRAFT;  Surgeon: Samara Deist, DPM;  Location: [facility];  Service: Podiatry;  Laterality:  Left;  ? GASTRIC BYPASS  05/21/05  ? Dr. Frutoso Chase, Roux-en-Y  ? Greenfield filter removal  2015  ? removed 6 wks after surgery  ? HAMMER TOE SURGERY Left 10/03/2020  ? Procedure: HAMMER TOE CORRECTION 2,3,4 AND Z - PLASTY LEFT;  Surgeon: Samara Deist, DPM;  Location: [place];  Service: Podiatry;  Laterality: Left;  ? hammerhead toes Left   ? HERNIA REPAIR    ? X 3  ? INCISION AND DRAINAGE OF WOUND Left 10/24/2020  ? Procedure: IRRIGATION AND DEBRIDEMENT; BELOW FASCIA FOOT; MULTIPLE;  Surgeon: Samara Deist, DPM;  Location: [place];  Service: Podiatry;  Laterality: Left;  IV with Local  ? IVC FILTER INSERTION N/A 09/09/2016  ? Procedure: IVC Filter Insertion;  Surgeon: Katha Cabal, MD;  Location: [place] [facility];  Service: Cardiovascular;  Laterality: N/A;  ? IVC FILTER REMOVAL N/A 11/25/2016  ? Procedure: IVC Filter Removal;  Surgeon: Katha Cabal, MD;  Location: [place] [facility];  Service: Cardiovascular;  Laterality: N/A;  ? JOINT REPLACEMENT    ? lyphoma excision  08/2015  ? arm  ? NASAL SEPTUM SURGERY  1981  ? deviated septum, repaired at [facility]  ? PARTIAL HYSTERECTOMY  1989  ? ovaries remain  ? SCAR REVISION N/A 08/02/2020  ? Procedure: ADVANCEMENT FLAP SCAR REVISION;  Surgeon: Margaretha Sheffield, MD;  Location: [place];  Service: ENT;  Laterality: N/A;  ? SKIN SURGERY    ? basel cell  ? TONSILLECTOMY  1969  ? TOTAL HIP ARTHROPLASTY Right 11/06/2015  ? Procedure: TOTAL HIP ARTHROPLASTY ANTERIOR APPROACH;  Surgeon: Hessie Knows, MD;  Location: [facility];  Service: Orthopedics;  Laterality: Right;  ? TOTAL HIP ARTHROPLASTY Left 03/19/2017  ? Procedure: TOTAL HIP ARTHROPLASTY ANTERIOR APPROACH;  Surgeon: Hessie Knows, MD;  Location: [facility];  Service: Orthopedics;  Laterality: Left;  ? TOTAL KNEE ARTHROPLASTY Left 08/2013  ? TOTAL THYROIDECTOMY  09/2011  ? benign path (done for multinodular goiter concern for cancer)  ? WEIL OSTEOTOMY Left 10/03/2020  ? Procedure: WEIL  OSTEOTOMY 3RD LEFT;  Surgeon: Samara Deist, DPM;  Location: [place];  Service: Podiatry;  Laterality: Left;  ?  ?Family History  ?Problem Relation Age of Onset  ? Diabetes Mother   ?     prediabetes  ? Alzheimer's disease Mother   ? Hyperlipidemia Mother   ? Diabetes Father   ? Coronary artery disease Father 50  ?     CABG  ? Alcohol abuse Father   ? Lung cancer Father   ? Brain cancer Maternal Uncle   ? Diabetes Paternal Grandmother   ? Lung cancer Brother   ? Alzheimer's disease Brother   ? Diabetes Maternal Grandmother   ? Stroke Neg Hx   ? Colon cancer  Neg Hx   ? Stomach cancer Neg Hx   ? Kidney disease Neg Hx   ? Breast cancer Neg Hx   ? Kidney cancer Neg Hx   ? Bladder Cancer Neg Hx   ? Ovarian cancer Neg Hx   ?  ?Social History  ? ?Socioeconomic History  ? Marital status: Married  ?  Spouse name: Gwyndolyn Saxon  ? Number of children: 2  ? Years of education: 37  ? Highest education level: High school graduate  ?Occupational History  ? Occupation: stay at home  ?Tobacco Use  ? Smoking status: Former  ?  Packs/day: 0.25  ?  Types: Cigarettes  ?  Quit date: 06/16/2004  ?  Years since quitting: 17.3  ? Smokeless tobacco: Never  ? Tobacco comments:  ?  Socially-no longer  ?Vaping Use  ? Vaping Use: Never used  ?Substance and Sexual Activity  ? Alcohol use: Yes  ?  Alcohol/week: 0.0 standard drinks  ?  Comment: Occasionally-beer or bloody mary monthly  ? Drug use: No  ? Sexual activity: Yes  ?  Partners: Male  ?  Birth control/protection: Surgical  ?Other Topics Concern  ? Not on file  ?Social History Narrative  ? Not on file  ? ?Social Determinants of Health  ? ?Financial Resource Strain: Low Risk   ? Difficulty of Paying Living Expenses: Not hard at all  ?Food Insecurity: No Food Insecurity  ? Worried About Charity fundraiser in the Last Year: Never true  ? Ran Out of Food in the Last Year: Never true  ?Transportation Needs: No Transportation Needs  ? Lack of Transportation (Medical): No  ? Lack of  Transportation (Non-Medical): No  ?Physical Activity: Insufficiently Active  ? Days of Exercise per Week: 3 days  ? Minutes of Exercise per Session: 20 min  ?Stress: Stress Concern Present  ? Feeling of Stress : To s

## 2021-10-09 NOTE — Telephone Encounter (Signed)
LOV:01/232023 ?LR:08/23/2021 Qty:90 R:1 ?NOV:10/28/2021 ?

## 2021-10-10 ENCOUNTER — Telehealth: Payer: Self-pay

## 2021-10-10 NOTE — Telephone Encounter (Signed)
-----   Message from Earlie Server, MD sent at 10/09/2021 10:20 PM EDT ----- ?Her copper level is slightly increased.  Please advise patient to stop over-the-counter copper supplementation if she is on any.  No need for treatment. ? ?

## 2021-10-10 NOTE — Telephone Encounter (Signed)
Called and left detailed VM on daughters phone informing her of lab results and recommendation to stop Copper supplementation. Advised to call back with any questions or concerns.  ?

## 2021-10-14 ENCOUNTER — Ambulatory Visit
Admission: RE | Admit: 2021-10-14 | Discharge: 2021-10-14 | Disposition: A | Discharge status: Home / Self Care | Payer: Medicare Other | Source: Ambulatory Visit | Attending: Neurology | Admitting: Neurology

## 2021-10-14 DIAGNOSIS — F0280 Dementia in other diseases classified elsewhere without behavioral disturbance: Secondary | ICD-10-CM

## 2021-10-14 DIAGNOSIS — I619 Nontraumatic intracerebral hemorrhage, unspecified: Secondary | ICD-10-CM | POA: Diagnosis not present

## 2021-10-14 DIAGNOSIS — R4189 Other symptoms and signs involving cognitive functions and awareness: Secondary | ICD-10-CM

## 2021-10-14 DIAGNOSIS — I6782 Cerebral ischemia: Secondary | ICD-10-CM | POA: Diagnosis not present

## 2021-10-14 DIAGNOSIS — G309 Alzheimer's disease, unspecified: Secondary | ICD-10-CM

## 2021-10-15 ENCOUNTER — Ambulatory Visit (INDEPENDENT_AMBULATORY_CARE_PROVIDER_SITE_OTHER): Payer: Medicare Other | Admitting: Family Medicine

## 2021-10-15 ENCOUNTER — Encounter: Payer: Self-pay | Admitting: Family Medicine

## 2021-10-15 VITALS — BP 120/70 | HR 59 | Temp 98.0°F | Resp 16 | Wt 210.0 lb

## 2021-10-15 DIAGNOSIS — I10 Essential (primary) hypertension: Secondary | ICD-10-CM | POA: Diagnosis not present

## 2021-10-15 NOTE — Progress Notes (Signed)
? ?I,Sulibeya S Dimas,acting as a scribe for Lavon Paganini, MD.,have documented all relevant documentation on the behalf of Lavon Paganini, MD,as directed by  Lavon Paganini, MD while in the presence of Lavon Paganini, MD. ?  ? ? ?Established patient visit ? ? ?Patient: Claire Strong   DOB: [birth year]   61 y.o. Female  MRN: [id number] ?Visit Date: 10/15/2021 ? ?Today's healthcare provider: Lavon Paganini, MD  ? ?Chief Complaint  ?Patient presents with  ? Hypertension  ? ?Subjective  ?  ?HPI  ?Hypertension, follow-up ? ?BP Readings from Last 3 Encounters:  ?10/15/21 120/70  ?08/08/21 (!) 142/96  ?07/09/21 (!) 116/92  ? Wt Readings from Last 3 Encounters:  ?10/15/21 210 lb (95.3 kg)  ?08/08/21 211 lb 6.4 oz (95.9 kg)  ?07/09/21 209 lb 1.6 oz (94.8 kg)  ?  ? ?She was last seen for hypertension 3 months ago.  ?BP at that visit was 124/80 . Management since that visit includes no changes. ? ?She reports excellent compliance with treatment. ?She is not having side effects.  ?She is following a Low Sodium diet. ?She is exercising. ?She does not smoke. ? ?Use of agents associated with hypertension: none.  ? ?Outside blood pressures are stable. ?Symptoms: ?No chest pain No chest pressure  ?No palpitations No syncope  ?No dyspnea No orthopnea  ?No paroxysmal nocturnal dyspnea Yes lower extremity edema  ? ?Pertinent labs ?Lab Results  ?Component Value Date  ? CHOL 201 (H) 07/08/2021  ? HDL 53 07/08/2021  ? LDLCALC 108 (H) 07/08/2021  ? TRIG 234 (H) 07/08/2021  ? CHOLHDL 3.8 07/08/2021  ? Lab Results  ?Component Value Date  ? NA 138 07/08/2021  ? K 3.9 07/08/2021  ? CREATININE 1.07 (H) 07/08/2021  ? EGFR 60 07/08/2021  ? GLUCOSE 104 (H) 10/08/2021  ? TSH 0.086 (L) 08/08/2021  ?  ? ?The 10-year ASCVD risk score (Arnett DK, et al., 2019) is: 4% ? ? ?Patient tearful about dementia diagnosis today. She is working with Neuro and neuropsych. She has discontinued many supplements and working to get off of  flexeril. ?--------------------------------------------------------------------------------------------------- ? ? ?Medications: ?Outpatient Medications Prior to Visit  ?Medication Sig  ? albuterol (VENTOLIN HFA) 108 (90 Base) MCG/ACT inhaler Inhale 2 puffs into the lungs every 6 (six) hours as needed for wheezing or shortness of breath.  ? aspirin 81 MG tablet Take 81 mg by mouth daily.  ? benazepril (LOTENSIN) 10 MG tablet Take 1 tablet (10 mg total) by mouth daily. NO FURTHER REFILLS UNTIL SEEN IN OFFICE.  ? cetirizine (ZYRTEC) 10 MG tablet Take 10 mg by mouth daily.  ? citalopram (CELEXA) 40 MG tablet Take 1 tablet by mouth once daily  ? Cyanocobalamin (VITAMIN B-12) 1000 MCG SUBL Place 1 tablet (1,000 mcg total) under the tongue daily.  ? cyclobenzaprine (FLEXERIL) 10 MG tablet Take 10 mg by mouth 3 (three) times daily as needed.  ? donepezil (ARICEPT) 10 MG tablet Take 1 tablet (10 mg total) by mouth at bedtime.  ? EPINEPHrine (EPI-PEN) 0.3 mg/0.3 mL DEVI Inject 0.3 mLs (0.3 mg total) into the muscle once.  ? estradiol (ESTRACE) 2 MG tablet Take 1 tablet by mouth daily.   ? Fluticasone-Salmeterol (ADVAIR DISKUS) 100-50 MCG/DOSE AEPB Inhale 1 puff into the lungs 2 (two) times daily.  ? furosemide (LASIX) 40 MG tablet TAKE 1 TABLET BY MOUTH ONCE DAILY (PLEASE  ATTEND  UPCOMING  VISIT  PRIOR  TO  FURTHER  REFILLS)  ? levothyroxine (SYNTHROID) 175 MCG  tablet Take 175 mcg by mouth daily.  ? metoprolol tartrate (LOPRESSOR) 25 MG tablet Take 1 tablet by mouth twice daily  ? montelukast (SINGULAIR) 10 MG tablet Take 1 tablet (10 mg total) by mouth every morning.  ? ondansetron (ZOFRAN) 4 MG tablet Take 1 tablet (4 mg total) by mouth daily as needed for nausea or vomiting.  ? pantoprazole (PROTONIX) 20 MG tablet Take 1 tablet by mouth twice daily  ? solifenacin (VESICARE) 5 MG tablet Take 5 mg by mouth daily.  ? SUMAtriptan (IMITREX) 50 MG tablet Take 50 mg by mouth every 2 (two) hours as needed for migraine. May  repeat in 2 hours if headache persists or recurs.  ? [DISCONTINUED] acetaminophen (TYLENOL) 500 MG tablet Take 1,000 mg by mouth daily at 8 pm. (Patient not taking: Reported on 08/12/2021)  ? [DISCONTINUED] Apoaequorin (PREVAGEN EXTRA STRENGTH) 20 MG CAPS Take by mouth.  ? [DISCONTINUED] Ascorbic Acid (VITAMIN C) 100 MG tablet Take by mouth daily. Unsure dose (Patient not taking: Reported on 08/12/2021)  ? [DISCONTINUED] bisacodyl (DULCOLAX) 5 MG EC tablet Take 5 mg by mouth daily. (Patient not taking: Reported on 08/12/2021)  ? [DISCONTINUED] buPROPion (WELLBUTRIN SR) 150 MG 12 hr tablet Take 1 tablet by mouth twice daily (Patient not taking: Reported on 08/12/2021)  ? [DISCONTINUED] calcium carbonate (CALCIUM 600) 600 MG TABS tablet Take 1 tablet (600 mg total) 2 (two) times daily with a meal by mouth. (Patient not taking: Reported on 08/12/2021)  ? [DISCONTINUED] Cholecalciferol (VITAMIN D3) 1000 UNITS tablet Take 2,000 Units by mouth daily. (Patient not taking: Reported on 08/12/2021)  ? [DISCONTINUED] ondansetron (ZOFRAN) 4 MG tablet Take 1 tablet (4 mg total) by mouth every 8 (eight) hours as needed for nausea or vomiting.  ? [DISCONTINUED] oxyCODONE-acetaminophen (PERCOCET) 5-325 MG tablet Take 1-2 tablets by mouth every 6 (six) hours as needed for severe pain. Max 6 tabs per day (Patient not taking: Reported on 08/12/2021)  ? [DISCONTINUED] Potassium 95 MG TABS Take 1 tablet by mouth daily. (Patient not taking: Reported on 08/12/2021)  ? [DISCONTINUED] zinc gluconate 50 MG tablet Take 50 mg by mouth daily. (Patient not taking: Reported on 08/12/2021)  ? ?No facility-administered medications prior to visit.  ? ? ?Review of Systems  ?Constitutional:  Positive for fatigue. Negative for activity change and appetite change.  ?Cardiovascular:  Positive for leg swelling. Negative for chest pain and palpitations.  ?Gastrointestinal:  Negative for abdominal pain, nausea and vomiting.  ? ?  ?  Objective  ?  ?BP 120/70 Comment:  home readings  Pulse (!) 59   Temp 98 ?F (36.7 ?C) (Oral)   Resp 16   Wt 210 lb (95.3 kg)   SpO2 99%   BMI 31.93 kg/m?  ?BP Readings from Last 3 Encounters:  ?10/15/21 120/70  ?08/08/21 (!) 142/96  ?07/09/21 (!) 116/92  ? ?Wt Readings from Last 3 Encounters:  ?10/15/21 210 lb (95.3 kg)  ?08/08/21 211 lb 6.4 oz (95.9 kg)  ?07/09/21 209 lb 1.6 oz (94.8 kg)  ? ?  ? ?Physical Exam ?Vitals reviewed.  ?Constitutional:   ?   General: She is not in acute distress. ?   Appearance: Normal appearance. She is well-developed. She is not diaphoretic.  ?HENT:  ?   Head: Normocephalic and atraumatic.  ?Eyes:  ?   General: No scleral icterus. ?   Conjunctiva/sclera: Conjunctivae normal.  ?Neck:  ?   Thyroid: No thyromegaly.  ?Cardiovascular:  ?   Rate and Rhythm: Normal  rate and regular rhythm.  ?   Pulses: Normal pulses.  ?   Heart sounds: Normal heart sounds. No murmur heard. ?Pulmonary:  ?   Effort: Pulmonary effort is normal. No respiratory distress.  ?   Breath sounds: Normal breath sounds. No wheezing, rhonchi or rales.  ?Musculoskeletal:  ?   Cervical back: Neck supple.  ?   Right lower leg: No edema.  ?   Left lower leg: No edema.  ?Lymphadenopathy:  ?   Cervical: No cervical adenopathy.  ?Skin: ?   General: Skin is warm and dry.  ?   Findings: No rash.  ?Neurological:  ?   Mental Status: She is alert. Mental status is at baseline.  ?Psychiatric:     ?   Mood and Affect: Affect is tearful.     ?   Behavior: Behavior normal. Behavior is cooperative.  ?  ? ? ?No results found for any visits on 10/15/21. ? Assessment & Plan  ?  ? ?Problem List Items Addressed This Visit   ? ?  ? Cardiovascular and Mediastinum  ? HTN (hypertension) - Primary  ?  Well controlled on home readings ?Continue current medications ?Reviewed recent metabolic panel ?F/u in 6 months  ?  ?  ?  ? ?Return in about 6 months (around 04/17/2022) for chronic disease f/u.  ?   ? ?I, Lavon Paganini, MD, have reviewed all documentation for this visit. The  documentation on 10/15/21 for the exam, diagnosis, procedures, and orders are all accurate and complete. ? ? ?Virginia Crews, MD, MPH ?[place] ?[facility]   ?

## 2021-10-15 NOTE — Assessment & Plan Note (Signed)
Well controlled on home readings ?Continue current medications ?Reviewed recent metabolic panel ?F/u in 6 months  ?

## 2021-10-17 ENCOUNTER — Encounter (INDEPENDENT_AMBULATORY_CARE_PROVIDER_SITE_OTHER): Payer: Medicare Other | Admitting: Neurology

## 2021-10-17 ENCOUNTER — Encounter: Payer: Self-pay | Admitting: Neurology

## 2021-10-17 DIAGNOSIS — R4189 Other symptoms and signs involving cognitive functions and awareness: Secondary | ICD-10-CM | POA: Diagnosis not present

## 2021-10-17 MED ORDER — QUETIAPINE FUMARATE 25 MG PO TABS: 25.0000 mg | ORAL_TABLET | Freq: Every day | ORAL | 6 refills | Status: DC

## 2021-10-17 NOTE — Progress Notes (Signed)
Please see the MyChart message reply(ies) for my assessment and plan.  ?  ?This patient gave consent for this Medical Advice Message and is aware that it may result in a bill to [employer], as well as the possibility of receiving a bill for a co-payment or deductible. They are an established patient, but are not seeking medical advice exclusively about a problem treated during an in person or video visit in the last seven days. I did not recommend an in person or video visit within seven days of my reply.  ?  ?I spent a total of 13 minutes cumulative time within 7 days through [employer]. ? ?Alric Ran, MD   ?

## 2021-10-22 ENCOUNTER — Emergency Department: Payer: Medicare Other

## 2021-10-22 ENCOUNTER — Other Ambulatory Visit: Payer: Self-pay

## 2021-10-22 ENCOUNTER — Emergency Department
Admission: EM | Admit: 2021-10-22 | Discharge: 2021-10-23 | Disposition: A | Discharge status: Other Acute Inpatient Hospital | Payer: Medicare Other | Attending: Emergency Medicine | Admitting: Emergency Medicine

## 2021-10-22 ENCOUNTER — Encounter: Payer: Self-pay | Admitting: Family Medicine

## 2021-10-22 DIAGNOSIS — N182 Chronic kidney disease, stage 2 (mild): Secondary | ICD-10-CM | POA: Diagnosis present

## 2021-10-22 DIAGNOSIS — Q631 Lobulated, fused and horseshoe kidney: Secondary | ICD-10-CM | POA: Diagnosis not present

## 2021-10-22 DIAGNOSIS — R600 Localized edema: Secondary | ICD-10-CM | POA: Insufficient documentation

## 2021-10-22 DIAGNOSIS — F411 Generalized anxiety disorder: Secondary | ICD-10-CM | POA: Diagnosis present

## 2021-10-22 DIAGNOSIS — Z20822 Contact with and (suspected) exposure to covid-19: Secondary | ICD-10-CM | POA: Insufficient documentation

## 2021-10-22 DIAGNOSIS — I471 Supraventricular tachycardia: Secondary | ICD-10-CM | POA: Insufficient documentation

## 2021-10-22 DIAGNOSIS — I872 Venous insufficiency (chronic) (peripheral): Secondary | ICD-10-CM | POA: Diagnosis not present

## 2021-10-22 DIAGNOSIS — R519 Headache, unspecified: Secondary | ICD-10-CM | POA: Diagnosis present

## 2021-10-22 DIAGNOSIS — R413 Other amnesia: Secondary | ICD-10-CM | POA: Diagnosis present

## 2021-10-22 DIAGNOSIS — M797 Fibromyalgia: Secondary | ICD-10-CM | POA: Diagnosis not present

## 2021-10-22 DIAGNOSIS — R4182 Altered mental status, unspecified: Secondary | ICD-10-CM | POA: Diagnosis present

## 2021-10-22 DIAGNOSIS — E669 Obesity, unspecified: Secondary | ICD-10-CM | POA: Diagnosis present

## 2021-10-22 DIAGNOSIS — R609 Edema, unspecified: Secondary | ICD-10-CM | POA: Diagnosis present

## 2021-10-22 DIAGNOSIS — Z66 Do not resuscitate: Secondary | ICD-10-CM

## 2021-10-22 DIAGNOSIS — F22 Delusional disorders: Secondary | ICD-10-CM | POA: Diagnosis not present

## 2021-10-22 DIAGNOSIS — I4719 Other supraventricular tachycardia: Secondary | ICD-10-CM | POA: Diagnosis present

## 2021-10-22 DIAGNOSIS — Z9884 Bariatric surgery status: Secondary | ICD-10-CM

## 2021-10-22 LAB — COMPREHENSIVE METABOLIC PANEL
ALT: 22 U/L (ref 0–44)
AST: 32 U/L (ref 15–41)
Albumin: 4.4 g/dL (ref 3.5–5.0)
Alkaline Phosphatase: 90 U/L (ref 38–126)
Anion gap: 13 (ref 5–15)
BUN: 15 mg/dL (ref 6–20)
CO2: 22 mmol/L (ref 22–32)
Calcium: 9.1 mg/dL (ref 8.9–10.3)
Chloride: 99 mmol/L (ref 98–111)
Creatinine, Ser: 1.37 mg/dL — ABNORMAL HIGH (ref 0.44–1.00)
GFR, Estimated: 44 mL/min — ABNORMAL LOW (ref >60)
Glucose, Bld: 134 mg/dL — ABNORMAL HIGH (ref 70–99)
Potassium: 3.6 mmol/L (ref 3.5–5.1)
Sodium: 134 mmol/L — ABNORMAL LOW (ref 135–145)
Total Bilirubin: 1.4 mg/dL — ABNORMAL HIGH (ref 0.3–1.2)
Total Protein: 7.3 g/dL (ref 6.5–8.1)

## 2021-10-22 LAB — CBC
HCT: 43.1 % (ref 36.0–46.0)
Hemoglobin: 14.7 g/dL (ref 12.0–15.0)
MCH: 29.3 pg (ref 26.0–34.0)
MCHC: 34.1 g/dL (ref 30.0–36.0)
MCV: 85.9 fL (ref 80.0–100.0)
Platelets: 269 10*3/uL (ref 150–400)
RBC: 5.02 MIL/uL (ref 3.87–5.11)
RDW: 12 % (ref 11.5–15.5)
WBC: 9 10*3/uL (ref 4.0–10.5)
nRBC: 0 % (ref 0.0–0.2)

## 2021-10-22 LAB — ETHANOL: Alcohol, Ethyl (B): <10 mg/dL (ref <10)

## 2021-10-22 LAB — ACETAMINOPHEN LEVEL: Acetaminophen (Tylenol), Serum: <10 ug/mL — ABNORMAL LOW (ref 10–30)

## 2021-10-22 LAB — SALICYLATE LEVEL: Salicylate Lvl: <7 mg/dL — ABNORMAL LOW (ref 7.0–30.0)

## 2021-10-22 MED ORDER — ZIPRASIDONE MESYLATE 20 MG IM SOLR
20.0000 mg | Freq: Once | INTRAMUSCULAR | Status: AC
  Administered 2021-10-23: 20 mg via INTRAMUSCULAR
  Filled 2021-10-22: qty 20

## 2021-10-22 MED ORDER — ALUM & MAG HYDROXIDE-SIMETH 200-200-20 MG/5ML PO SUSP: 30.0000 mL | Freq: Four times a day (QID) | ORAL | Status: DC | PRN

## 2021-10-22 MED ORDER — HALOPERIDOL 5 MG PO TABS
5.0000 mg | ORAL_TABLET | ORAL | Status: AC
  Administered 2021-10-22: 5 mg via ORAL
  Filled 2021-10-22: qty 1

## 2021-10-22 MED ORDER — ONDANSETRON HCL 4 MG PO TABS: 4.0000 mg | ORAL_TABLET | Freq: Three times a day (TID) | ORAL | Status: DC | PRN

## 2021-10-22 MED ORDER — ACETAMINOPHEN 325 MG PO TABS: 650.0000 mg | ORAL_TABLET | ORAL | Status: DC | PRN

## 2021-10-22 NOTE — ED Notes (Signed)
Pt daughter taking all the belongings with her home. Daughter will be available for any questions or concerns.  ?

## 2021-10-22 NOTE — Telephone Encounter (Signed)
CCM might be the best option for this patient.  Then the RN CM and SW could work together for resources. Please place referral if they agree. ?
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
## 2021-10-22 NOTE — ED Notes (Signed)
Pt rolled to CT by this nurse. Pt is confused still, complaining of bleeding from many different body parts and glass everywhere. Pt had urinated on self when attempted to take to CT. Cleaned by this nusre and Sula Soda, new bed sheets provided and scrubs changed ?

## 2021-10-22 NOTE — ED Notes (Signed)
Pt aware of need for UA but does not need to go at this time.  ?

## 2021-10-22 NOTE — ED Triage Notes (Signed)
Pt brought in by [employer]. Per report, pt dx with dementia apx 1 yr ago. Husband called today due to pt wandering into traffic and into neighbors yards. Pt hallucinating that house has burned down. Calm and cooperative in triage, unable to provide hx. Daughter in room. States pt was acting erractic, combative towards husband today and tearing things off of walls, and has not slept in 4 days. States have been unable to obtain placement for pt, but unable to provide care at home due to pt behavior.  ?

## 2021-10-23 ENCOUNTER — Encounter: Payer: Self-pay | Admitting: Oncology

## 2021-10-23 DIAGNOSIS — I471 Supraventricular tachycardia: Secondary | ICD-10-CM | POA: Diagnosis not present

## 2021-10-23 DIAGNOSIS — Z20822 Contact with and (suspected) exposure to covid-19: Secondary | ICD-10-CM | POA: Diagnosis not present

## 2021-10-23 DIAGNOSIS — Q631 Lobulated, fused and horseshoe kidney: Secondary | ICD-10-CM | POA: Diagnosis not present

## 2021-10-23 DIAGNOSIS — N182 Chronic kidney disease, stage 2 (mild): Secondary | ICD-10-CM | POA: Diagnosis not present

## 2021-10-23 DIAGNOSIS — F22 Delusional disorders: Secondary | ICD-10-CM | POA: Diagnosis not present

## 2021-10-23 DIAGNOSIS — I872 Venous insufficiency (chronic) (peripheral): Secondary | ICD-10-CM | POA: Diagnosis not present

## 2021-10-23 DIAGNOSIS — M797 Fibromyalgia: Secondary | ICD-10-CM | POA: Diagnosis not present

## 2021-10-23 DIAGNOSIS — F411 Generalized anxiety disorder: Secondary | ICD-10-CM

## 2021-10-23 DIAGNOSIS — R600 Localized edema: Secondary | ICD-10-CM | POA: Diagnosis not present

## 2021-10-23 DIAGNOSIS — R519 Headache, unspecified: Secondary | ICD-10-CM | POA: Diagnosis not present

## 2021-10-23 LAB — URINALYSIS, ROUTINE W REFLEX MICROSCOPIC
Bilirubin Urine: NEGATIVE
Glucose, UA: NEGATIVE mg/dL
Hgb urine dipstick: NEGATIVE
Ketones, ur: 5 mg/dL — AB
Leukocytes,Ua: NEGATIVE
Nitrite: NEGATIVE
Protein, ur: NEGATIVE mg/dL
Specific Gravity, Urine: 1.004 — ABNORMAL LOW (ref 1.005–1.030)
pH: 5 (ref 5.0–8.0)

## 2021-10-23 LAB — URINE DRUG SCREEN, QUALITATIVE (ARMC ONLY)
Amphetamines, Ur Screen: NOT DETECTED
Barbiturates, Ur Screen: NOT DETECTED
Benzodiazepine, Ur Scrn: NOT DETECTED
Cannabinoid 50 Ng, Ur ~~LOC~~: NOT DETECTED
Cocaine Metabolite,Ur ~~LOC~~: NOT DETECTED
MDMA (Ecstasy)Ur Screen: NOT DETECTED
Methadone Scn, Ur: NOT DETECTED
Opiate, Ur Screen: NOT DETECTED
Phencyclidine (PCP) Ur S: NOT DETECTED
Tricyclic, Ur Screen: POSITIVE — AB

## 2021-10-23 LAB — RESP PANEL BY RT-PCR (FLU A&B, COVID) ARPGX2
Influenza A by PCR: NEGATIVE
Influenza B by PCR: NEGATIVE
SARS Coronavirus 2 by RT PCR: NEGATIVE

## 2021-10-23 MED ORDER — ASPIRIN EC 81 MG PO TBEC: 81.0000 mg | DELAYED_RELEASE_TABLET | Freq: Every day | ORAL | Status: DC

## 2021-10-23 MED ORDER — MOMETASONE FURO-FORMOTEROL FUM 100-5 MCG/ACT IN AERO
2.0000 | INHALATION_SPRAY | Freq: Two times a day (BID) | RESPIRATORY_TRACT | Status: DC
  Filled 2021-10-23: qty 8.8

## 2021-10-23 MED ORDER — LIOTHYRONINE SODIUM 5 MCG PO TABS
5.0000 ug | ORAL_TABLET | Freq: Every morning | ORAL | Status: DC
  Filled 2021-10-23: qty 1

## 2021-10-23 MED ORDER — MONTELUKAST SODIUM 10 MG PO TABS: 10.0000 mg | ORAL_TABLET | Freq: Every morning | ORAL | Status: DC

## 2021-10-23 MED ORDER — QUETIAPINE FUMARATE 25 MG PO TABS: 25.0000 mg | ORAL_TABLET | Freq: Every day | ORAL | Status: DC

## 2021-10-23 MED ORDER — ALBUTEROL SULFATE (2.5 MG/3ML) 0.083% IN NEBU: 2.5000 mg | INHALATION_SOLUTION | Freq: Four times a day (QID) | RESPIRATORY_TRACT | Status: DC | PRN

## 2021-10-23 MED ORDER — DONEPEZIL HCL 5 MG PO TABS: 10.0000 mg | ORAL_TABLET | Freq: Every day | ORAL | Status: DC

## 2021-10-23 NOTE — BH Assessment (Signed)
Referral information for Geropsych faxed to: ? ?[facility] [phone]) ? ?[facility] [phone]),  ? ?[facility] [phone] -[phone] ? ?[facility] [phone]), ? ?[facility] [phone] -[phone]),  ? ?[facility] [phone] or [phone]),  ? ?

## 2021-10-23 NOTE — ED Provider Notes (Signed)
Emergency Medicine Observation Re-evaluation Note ? ?Claire Strong is a 61 y.o. female, seen on rounds today.  Pt initially presented to the ED for complaints of Altered Mental Status ? ? ?Physical Exam  ?BP (!) 144/88 (BP Location: Right Arm)   Pulse 88   Temp (!) 97.5 ?F (36.4 ?C) (Oral)   Resp 16   Ht 5\' 8"  (1.727 m)   Wt 95.3 kg   SpO2 99%   BMI 31.93 kg/m?  ?Physical Exam ?General: no acute distress ? ?Psych: calm ? ?ED Course / MDM  ?EKG:  ? ?I have reviewed the labs performed to date as well as medications administered while in observation.  Recent changes in the last 24 hours include receiving some sedation medication for agitation. ? ?Plan  ?Current plan is for psych eval. ?Claire Strong is under involuntary commitment. ?  ? ?  ?Lucrezia Starch, MD ?10/23/21 [phone] ? ?

## 2021-10-23 NOTE — ED Notes (Signed)
IVC/Inpt Admit Recommended  ?

## 2021-10-23 NOTE — BH Assessment (Signed)
Comprehensive Clinical Assessment (CCA) Note ? ?10/23/2021 ?DOT SPLINTER ?[id number] ?Recommendations for Services/Supports/Treatments: ?Disposition: Consulted with Lynder Parents., NP, who determined pt. meets inpatient psychiatric criteria and is recommended for geropsychiatry when medically cleared. Notified Dr. Joni Fears and Gerald Stabs, RN of disposition recommendation.  ? ?Claire Strong is a 61 year old, English speaking, white female with a PMH hx of GAD. Pt also has a dx of dementia. Per pt.'s first note: Pt brought in by [employer]. Per report, pt dx with dementia apx 1 yr. ago. Husband called today due to pt. wandering into traffic and into neighbors' yards. Pt hallucinating that house has burned down.  Upon assessment pt. continued to endorse delusions about the hospital/her house burning down. Pt perseverated about having multiple chronic illnesses and other various somatic complaints. Pt was oriented x2 but had impaired judgment. Pt had poor realty testing and was confused. Pt's anxiety interferes with her ability to concentrate. Pt had no insight and was cooperative throughout the interview. Pt had an anxious mood and a congruent affect.  ?Chief Complaint:  ?Chief Complaint  ?Patient presents with  ? Altered Mental Status  ? ?Visit Diagnosis: [place] kidney ?  Headache ?  Edema ?  Atrial tachycardia ([place]) ?  Fibromyalgia ?  Chronic venous insufficiency ?  CKD (chronic kidney disease) stage 2, GFR 60-89 ml/min ?  History of gastric bypass ?  Obesity ?  GAD (generalized anxiety disorder) ?  Memory changes  ? ? ?CCA Screening, Triage and Referral (STR) ? ?Patient Reported Information ?How did you hear about [place]? Family/Friend ? ?Referral name: No data recorded ?Referral phone number: No data recorded ? ?Whom do you see for routine medical problems? No data recorded ?Practice/Facility Name: No data recorded ?Practice/Facility Phone Number: No data recorded ?Name of Contact: No data recorded ?Contact Number: No data  recorded ?Contact Fax Number: No data recorded ?Prescriber Name: No data recorded ?Prescriber Address (if known): No data recorded ? ?What Is the Reason for Your Visit/Call Today? Pt brought in by [employer]. Per report, pt dx with dementia apx 1 yr ago. Husband called today due to pt wandering into traffic and into neighbors yards. Pt hallucinating that house has burned down. ? ?How Long Has This Been Causing You Problems? > than 6 months ? ?What Do You Feel Would Help You the Most Today? Treatment for Depression or other mood problem ? ? ?Have You Recently Been in Any Inpatient Treatment (Hospital/Detox/Crisis Center/28-Day Program)? No data recorded ?Name/Location of Program/Hospital:No data recorded ?How Long Were You There? No data recorded ?When Were You Discharged? No data recorded ? ?Have You Ever Received Services From [facility] [employer] Before? No data recorded ?Who Do You See at [facility]? No data recorded ? ?Have You Recently Had Any Thoughts About Hurting Yourself? -- (UTA) ? ?Are You Planning to Commit Suicide/Harm Yourself At This time? -- (UTA) ? ? ?Have you Recently Had Thoughts About [place]? -- ([place]) ? ?Explanation: No data recorded ? ?Have You Used Any Alcohol or Drugs in the Past 24 Hours? -- (UTA) ? ?How Long Ago Did You Use Drugs or Alcohol? No data recorded ?What Did You Use and How Much? No data recorded ? ?Do You Currently Have a Therapist/Psychiatrist? -- (UTA) ? ?Name of Therapist/Psychiatrist: No data recorded ? ?Have You Been Recently Discharged From Any Office Practice or Programs? -- (UTA) ? ?Explanation of Discharge From Practice/Program: No data recorded ? ?  ?CCA Screening Triage Referral Assessment ?Type of Contact: Face-to-Face ? ?Is  this Initial or Reassessment? No data recorded ?Date Telepsych consult ordered in [facility]:  No data recorded ?Time Telepsych consult ordered in CHL:  No data recorded ? ?Patient Reported Information Reviewed? No data recorded ?Patient Left Without Being  Seen? No data recorded ?Reason for Not Completing Assessment: No data recorded ? ?Collateral Involvement: None provided ? ? ?Does Patient Have a Stage manager Guardian? No data recorded ?Name and Contact of Legal Guardian: No data recorded ?If Minor and Not Living with Parent(s), Who has Custody? -- (UTA) ? ?Is CPS involved or ever been involved? Never ? ?Is APS involved or ever been involved? Never ? ? ?Patient Determined To Be At Risk for Harm To Self or Others Based on Review of Patient Reported Information or Presenting Complaint? No ? ?Method: No data recorded ?Availability of Means: No data recorded ?Intent: No data recorded ?Notification Required: No data recorded ?Additional Information for Danger to Others Potential: No data recorded ?Additional Comments for Danger to Others Potential: No data recorded ?Are There Guns or Other Weapons in [place]? No data recorded ?Types of Guns/Weapons: No data recorded ?Are These Weapons Safely Secured?                            No data recorded ?Who Could Verify You Are Able To Have These Secured: No data recorded ?Do You Have any Outstanding Charges, Pending Court Dates, Parole/Probation? No data recorded ?Contacted To Inform of Risk of Harm To Self or Others: No data recorded ? ?Location of Assessment: No data recorded ? ?Does Patient Present under Involuntary Commitment? Yes ? ?IVC Papers Initial File Date: 10/22/21 ? ? ?[place] of Residence: [place] ? ? ?Patient Currently Receiving the Following Services: Medication Management ? ? ?Determination of Need: Emergent (2 hours) ? ? ?Options For Referral: Inpatient Hospitalization ? ? ? ? ?CCA Biopsychosocial ?Intake/Chief Complaint:  No data recorded ?Current Symptoms/Problems: No data recorded ? ?Patient Reported Schizophrenia/Schizoaffective Diagnosis in Past: No ? ? ?Strengths: Pt has a supportive family ? ?Preferences: No data recorded ?Abilities: No data recorded ? ?Type of Services Patient Feels are  Needed: No data recorded ? ?Initial Clinical Notes/Concerns: No data recorded ? ?Mental Health Symptoms ?Depression:   ?-- (UTA) ?  ?Duration of Depressive symptoms: No data recorded  ?Mania:   ?-- (UTA) ?  ?Anxiety:    ?Worrying; Tension ?  ?Psychosis:   ?Delusions ?  ?Duration of Psychotic symptoms:  ?Greater than six months ?  ?Trauma:   ?N/A ?  ?Obsessions:   ?Cause anxiety; Disrupts routine/functioning; Recurrent & persistent thoughts/impulses/images ?  ?Compulsions:   ?N/A ?  ?Inattention:   ?None ?  ?Hyperactivity/Impulsivity:   ?None ?  ?Oppositional/Defiant Behaviors:   ?N/A ?  ?Emotional Irregularity:   ?N/A ?  ?Other Mood/Personality Symptoms:  No data recorded  ? ?Mental Status Exam ?Appearance and self-care  ?Stature:   ?Average ?  ?Weight:   ?Average weight ?  ?Clothing:   ?-- (UTA) ?  ?Grooming:   ?Normal ?  ?Cosmetic use:   ?None ?  ?Posture/gait:   ?Normal ?  ?Motor activity:   ?Restless ?  ?Sensorium  ?Attention:   ?Confused ?  ?Concentration:   ?Anxiety interferes ?  ?Orientation:   ?Object; Place; Person; Situation ?  ?Recall/memory:   ?Defective in Immediate; Defective in Short-term; Defective in Recent; Defective in Remote ?  ?Affect and Mood  ?Affect:   ?Anxious ?  ?Mood:   ?Anxious ?  ?  Relating  ?Eye contact:   ?Normal ?  ?Facial expression:   ?Anxious ?  ?Attitude toward examiner:   ?Suspicious ?  ?Thought and Language  ?Speech flow:  ?Clear and Coherent ?  ?Thought content:   ?Delusions ?  ?Preoccupation:   ?Obsessions ?  ?Hallucinations:   ?None ?  ?Organization:  No data recorded  ?Executive Functions  ?Fund of Knowledge:   ?Average ?  ?Intelligence:   ?Average ?  ?Abstraction:   ?Overly abstract ?  ?Judgement:   ?Poor ?  ?Reality Testing:   ?Unaware ?  ?Insight:   ?Poor ?  ?Decision Making:   ?Confused ?  ?Social Functioning  ?Social Maturity:   ?Responsible ?  ?Social Judgement:   ?Impropriety ?  ?Stress  ?Stressors:   ?Illness ?  ?Coping Ability:   ?Exhausted ?  ?Skill Deficits:    ?Communication; Self-control; Interpersonal ?  ?Supports:   ?Family ?  ? ? ?Religion: ?Religion/Spirituality ?Are You A Religious Person?:  (UTA) ?How Might This Affect Treatment?:  (UTA) ? ?Leisure/Recreation: ?Sharyne Peach

## 2021-10-23 NOTE — ED Notes (Signed)
Pt is becoming more agitated and fearful due to believing she is bleeding from many different areas of her bad, states there is glass everywhere in here room and she is unsafe. Pt has destroyed few items in the room and is inconsolable and harder to redirect. Dr. Joni Fears notified, order to follow ?

## 2021-10-23 NOTE — ED Notes (Signed)
[facility] Dept  called  for  transport  to  [facility] ?
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
## 2021-10-23 NOTE — ED Notes (Signed)
IVC/Consult ordered ?

## 2021-10-23 NOTE — BH Assessment (Signed)
Patient has been accepted to [facility].  ?Accepting physician is Dr. Ron Parker.  ?Call report to [phone].  ?Representative was [employer].  ? ?ER Staff is aware of it:  ?Louanne, ER Secretary  ?Dr. Wyline Copas, ER MD  ?Opal Sidles, Patient's Nurse ? ? ?

## 2021-10-23 NOTE — ED Provider Notes (Signed)
Vitals:  ? 10/22/21 2054  ?BP: (!) 144/88  ?Pulse: 88  ?Resp: 16  ?Temp: (!) 97.5 ?F (36.4 ?C)  ?SpO2: 99%  ? ? ? ?Patient resting comfortably in no distress.  She has been accepted to [facility] geriatric unit.  [facility] office here to provide transport.  Patient is calm, appropriate and voices understanding of the plan for transfer has no questions or concerns. ? ?Stable for transfer ?  ?Delman Kitten, MD ?10/23/21 0831 ? ?

## 2021-10-28 ENCOUNTER — Ambulatory Visit: Payer: Medicare Other | Admitting: Family Medicine

## 2021-11-02 DIAGNOSIS — G301 Alzheimer's disease with late onset: Secondary | ICD-10-CM | POA: Diagnosis not present

## 2021-11-03 DIAGNOSIS — G301 Alzheimer's disease with late onset: Secondary | ICD-10-CM | POA: Diagnosis not present

## 2021-11-16 DIAGNOSIS — G301 Alzheimer's disease with late onset: Secondary | ICD-10-CM | POA: Diagnosis not present

## 2021-11-22 ENCOUNTER — Ambulatory Visit: Payer: Medicare Other | Admitting: Cardiovascular Disease

## 2021-12-02 ENCOUNTER — Other Ambulatory Visit: Payer: Self-pay | Admitting: Cardiovascular Disease

## 2021-12-22 DIAGNOSIS — G301 Alzheimer's disease with late onset: Secondary | ICD-10-CM | POA: Diagnosis not present

## 2021-12-30 ENCOUNTER — Other Ambulatory Visit: Payer: Self-pay | Admitting: Family Medicine

## 2021-12-30 ENCOUNTER — Encounter: Payer: Self-pay | Admitting: Family Medicine

## 2021-12-30 ENCOUNTER — Other Ambulatory Visit: Payer: Medicare Other

## 2021-12-31 ENCOUNTER — Other Ambulatory Visit: Payer: Self-pay

## 2021-12-31 ENCOUNTER — Inpatient Hospital Stay
Admission: EM | Admit: 2021-12-31 | Discharge: 2022-01-15 | DRG: 644 | Disposition: A | Discharge status: Nursing Home (Includes ICF) | Payer: Medicare Other | Attending: Internal Medicine | Admitting: Internal Medicine

## 2021-12-31 DIAGNOSIS — F03B4 Unspecified dementia, moderate, with anxiety: Secondary | ICD-10-CM | POA: Diagnosis not present

## 2021-12-31 DIAGNOSIS — F05 Delirium due to known physiological condition: Secondary | ICD-10-CM | POA: Diagnosis present

## 2021-12-31 DIAGNOSIS — E039 Hypothyroidism, unspecified: Secondary | ICD-10-CM | POA: Diagnosis present

## 2021-12-31 DIAGNOSIS — Z85828 Personal history of other malignant neoplasm of skin: Secondary | ICD-10-CM

## 2021-12-31 DIAGNOSIS — F0390 Unspecified dementia without behavioral disturbance: Secondary | ICD-10-CM

## 2021-12-31 DIAGNOSIS — F0393 Unspecified dementia, unspecified severity, with mood disturbance: Secondary | ICD-10-CM | POA: Diagnosis present

## 2021-12-31 DIAGNOSIS — Z880 Allergy status to penicillin: Secondary | ICD-10-CM

## 2021-12-31 DIAGNOSIS — Z8249 Family history of ischemic heart disease and other diseases of the circulatory system: Secondary | ICD-10-CM

## 2021-12-31 DIAGNOSIS — I10 Essential (primary) hypertension: Secondary | ICD-10-CM | POA: Diagnosis present

## 2021-12-31 DIAGNOSIS — F22 Delusional disorders: Principal | ICD-10-CM

## 2021-12-31 DIAGNOSIS — Z9884 Bariatric surgery status: Secondary | ICD-10-CM

## 2021-12-31 DIAGNOSIS — F0392 Unspecified dementia, unspecified severity, with psychotic disturbance: Secondary | ICD-10-CM | POA: Diagnosis not present

## 2021-12-31 DIAGNOSIS — N179 Acute kidney failure, unspecified: Secondary | ICD-10-CM | POA: Diagnosis present

## 2021-12-31 DIAGNOSIS — M549 Dorsalgia, unspecified: Secondary | ICD-10-CM | POA: Diagnosis present

## 2021-12-31 DIAGNOSIS — K200 Eosinophilic esophagitis: Secondary | ICD-10-CM | POA: Diagnosis present

## 2021-12-31 DIAGNOSIS — D6862 Lupus anticoagulant syndrome: Secondary | ICD-10-CM | POA: Diagnosis not present

## 2021-12-31 DIAGNOSIS — E890 Postprocedural hypothyroidism: Principal | ICD-10-CM | POA: Diagnosis present

## 2021-12-31 DIAGNOSIS — F331 Major depressive disorder, recurrent, moderate: Secondary | ICD-10-CM | POA: Diagnosis not present

## 2021-12-31 DIAGNOSIS — Z20822 Contact with and (suspected) exposure to covid-19: Secondary | ICD-10-CM | POA: Diagnosis present

## 2021-12-31 DIAGNOSIS — F03918 Unspecified dementia, unspecified severity, with other behavioral disturbance: Secondary | ICD-10-CM | POA: Diagnosis not present

## 2021-12-31 DIAGNOSIS — Z820 Family history of epilepsy and other diseases of the nervous system: Secondary | ICD-10-CM

## 2021-12-31 DIAGNOSIS — F489 Nonpsychotic mental disorder, unspecified: Secondary | ICD-10-CM | POA: Diagnosis present

## 2021-12-31 DIAGNOSIS — Z86718 Personal history of other venous thrombosis and embolism: Secondary | ICD-10-CM | POA: Diagnosis not present

## 2021-12-31 DIAGNOSIS — J4521 Mild intermittent asthma with (acute) exacerbation: Secondary | ICD-10-CM | POA: Diagnosis not present

## 2021-12-31 DIAGNOSIS — F411 Generalized anxiety disorder: Secondary | ICD-10-CM | POA: Diagnosis not present

## 2021-12-31 DIAGNOSIS — Z96643 Presence of artificial hip joint, bilateral: Secondary | ICD-10-CM | POA: Diagnosis present

## 2021-12-31 DIAGNOSIS — Z885 Allergy status to narcotic agent status: Secondary | ICD-10-CM

## 2021-12-31 DIAGNOSIS — M797 Fibromyalgia: Secondary | ICD-10-CM | POA: Diagnosis present

## 2021-12-31 DIAGNOSIS — E860 Dehydration: Secondary | ICD-10-CM | POA: Diagnosis present

## 2021-12-31 DIAGNOSIS — Z888 Allergy status to other drugs, medicaments and biological substances status: Secondary | ICD-10-CM

## 2021-12-31 DIAGNOSIS — E669 Obesity, unspecified: Secondary | ICD-10-CM | POA: Diagnosis present

## 2021-12-31 DIAGNOSIS — R1310 Dysphagia, unspecified: Secondary | ICD-10-CM | POA: Diagnosis present

## 2021-12-31 DIAGNOSIS — Z79818 Long term (current) use of other agents affecting estrogen receptors and estrogen levels: Secondary | ICD-10-CM

## 2021-12-31 DIAGNOSIS — F03B2 Unspecified dementia, moderate, with psychotic disturbance: Secondary | ICD-10-CM | POA: Diagnosis not present

## 2021-12-31 DIAGNOSIS — Z882 Allergy status to sulfonamides status: Secondary | ICD-10-CM

## 2021-12-31 DIAGNOSIS — J45909 Unspecified asthma, uncomplicated: Secondary | ICD-10-CM | POA: Diagnosis present

## 2021-12-31 DIAGNOSIS — G8929 Other chronic pain: Secondary | ICD-10-CM | POA: Diagnosis present

## 2021-12-31 DIAGNOSIS — Z96652 Presence of left artificial knee joint: Secondary | ICD-10-CM | POA: Diagnosis present

## 2021-12-31 DIAGNOSIS — N3281 Overactive bladder: Secondary | ICD-10-CM | POA: Diagnosis not present

## 2021-12-31 DIAGNOSIS — I959 Hypotension, unspecified: Secondary | ICD-10-CM | POA: Diagnosis not present

## 2021-12-31 DIAGNOSIS — I739 Peripheral vascular disease, unspecified: Secondary | ICD-10-CM | POA: Diagnosis present

## 2021-12-31 DIAGNOSIS — F0394 Unspecified dementia, unspecified severity, with anxiety: Secondary | ICD-10-CM | POA: Diagnosis present

## 2021-12-31 DIAGNOSIS — Z6831 Body mass index (BMI) 31.0-31.9, adult: Secondary | ICD-10-CM

## 2021-12-31 DIAGNOSIS — I129 Hypertensive chronic kidney disease with stage 1 through stage 4 chronic kidney disease, or unspecified chronic kidney disease: Secondary | ICD-10-CM | POA: Diagnosis not present

## 2021-12-31 DIAGNOSIS — Z87891 Personal history of nicotine dependence: Secondary | ICD-10-CM

## 2021-12-31 DIAGNOSIS — N182 Chronic kidney disease, stage 2 (mild): Secondary | ICD-10-CM | POA: Diagnosis present

## 2021-12-31 DIAGNOSIS — R413 Other amnesia: Secondary | ICD-10-CM | POA: Diagnosis not present

## 2021-12-31 DIAGNOSIS — Z85820 Personal history of malignant melanoma of skin: Secondary | ICD-10-CM

## 2021-12-31 DIAGNOSIS — Z79899 Other long term (current) drug therapy: Secondary | ICD-10-CM

## 2021-12-31 DIAGNOSIS — Z91040 Latex allergy status: Secondary | ICD-10-CM

## 2021-12-31 LAB — COMPREHENSIVE METABOLIC PANEL
ALT: 67 U/L — ABNORMAL HIGH (ref 0–44)
AST: 45 U/L — ABNORMAL HIGH (ref 15–41)
Albumin: 3.3 g/dL — ABNORMAL LOW (ref 3.5–5.0)
Alkaline Phosphatase: 107 U/L (ref 38–126)
Anion gap: 6 (ref 5–15)
BUN: 18 mg/dL (ref 6–20)
CO2: 20 mmol/L — ABNORMAL LOW (ref 22–32)
Calcium: 7.9 mg/dL — ABNORMAL LOW (ref 8.9–10.3)
Chloride: 118 mmol/L — ABNORMAL HIGH (ref 98–111)
Creatinine, Ser: 0.88 mg/dL (ref 0.44–1.00)
GFR, Estimated: >60 mL/min (ref >60)
Glucose, Bld: 101 mg/dL — ABNORMAL HIGH (ref 70–99)
Potassium: 3.4 mmol/L — ABNORMAL LOW (ref 3.5–5.1)
Sodium: 144 mmol/L (ref 135–145)
Total Bilirubin: 0.5 mg/dL (ref 0.3–1.2)
Total Protein: 5.9 g/dL — ABNORMAL LOW (ref 6.5–8.1)

## 2021-12-31 LAB — CBC
HCT: 37.3 % (ref 36.0–46.0)
Hemoglobin: 12.4 g/dL (ref 12.0–15.0)
MCH: 29.7 pg (ref 26.0–34.0)
MCHC: 33.2 g/dL (ref 30.0–36.0)
MCV: 89.4 fL (ref 80.0–100.0)
Platelets: 214 10*3/uL (ref 150–400)
RBC: 4.17 MIL/uL (ref 3.87–5.11)
RDW: 14.9 % (ref 11.5–15.5)
WBC: 5.9 10*3/uL (ref 4.0–10.5)
nRBC: 0 % (ref 0.0–0.2)

## 2021-12-31 LAB — URINALYSIS, ROUTINE W REFLEX MICROSCOPIC
Bilirubin Urine: NEGATIVE
Glucose, UA: NEGATIVE mg/dL
Hgb urine dipstick: NEGATIVE
Ketones, ur: NEGATIVE mg/dL
Leukocytes,Ua: NEGATIVE
Nitrite: NEGATIVE
Protein, ur: NEGATIVE mg/dL
Specific Gravity, Urine: 1.02 (ref 1.005–1.030)
pH: 5 (ref 5.0–8.0)

## 2021-12-31 LAB — RESP PANEL BY RT-PCR (FLU A&B, COVID) ARPGX2
Influenza A by PCR: NEGATIVE
Influenza B by PCR: NEGATIVE
SARS Coronavirus 2 by RT PCR: NEGATIVE

## 2021-12-31 LAB — URINE DRUG SCREEN, QUALITATIVE (ARMC ONLY)
Amphetamines, Ur Screen: NOT DETECTED
Barbiturates, Ur Screen: NOT DETECTED
Benzodiazepine, Ur Scrn: NOT DETECTED
Cannabinoid 50 Ng, Ur ~~LOC~~: NOT DETECTED
Cocaine Metabolite,Ur ~~LOC~~: NOT DETECTED
MDMA (Ecstasy)Ur Screen: NOT DETECTED
Methadone Scn, Ur: NOT DETECTED
Opiate, Ur Screen: NOT DETECTED
Phencyclidine (PCP) Ur S: NOT DETECTED
Tricyclic, Ur Screen: POSITIVE — AB

## 2021-12-31 LAB — SALICYLATE LEVEL: Salicylate Lvl: <7 mg/dL — ABNORMAL LOW (ref 7.0–30.0)

## 2021-12-31 LAB — ACETAMINOPHEN LEVEL: Acetaminophen (Tylenol), Serum: <10 ug/mL — ABNORMAL LOW (ref 10–30)

## 2021-12-31 LAB — ETHANOL: Alcohol, Ethyl (B): <10 mg/dL (ref <10)

## 2021-12-31 NOTE — Consult Note (Signed)
[facility] Consult   Reason for Consult: Behavioral Evaluation Referring Physician: Dr. Starleen Blue Patient Identification: Claire Strong MRN:  [id number] Principal Diagnosis: <principal problem not specified> Diagnosis:  Active Problems:   Lupus anticoagulant disorder ([place])   Bronchial asthma   Fibromyalgia   Moderate episode of recurrent major depressive disorder (HCC)   GAD (generalized anxiety disorder)   Memory changes   Dementia ([place])   Total Time spent with patient: 1 hour  Subjective: "I am here to get all of these skin off me." Claire Strong is a 61 y.o. female patient presented to [facility] ED via POV voluntary by her daughter. The patient is resting quietly no behavior issues. The patient is alert and oriented x2.   Per the ED triage nurses note, Pt to ED POV with daughter, Benjamine Mola "[name]" who is POA. Confirmed demographic info/phone numbers. Daughter tearful, explaining that pt has had steady decline since discharge from [facility] in [place], [place]. Diagnosed with dementia in 4/23, had psychosis while in hospital in 5/23 while in hospital. Daughter states pt is not doing well, is confused, and continuing to decline since discharged from [facility] last Tuesday. Pt has been wandering, getting into car to drive although license has been revoked. Pt stating "I am fine" and "they put embalming fluid into me" at [facility] states hears voices telling her "nothing much, just that I've got embalming fluid in me" and "just stuff". States "they gave me a new bionic body" at the hospital.  This provider saw the patient face-to-face; the chart was reviewed, and consulted with Dr. Starleen Blue on 12/31/2021 due to the patient's care. It was discussed with the EDP that the patient does not meet the criteria to be admitted to the geriatric-psychiatric inpatient admission unit.  On evaluation, the patient is alert and oriented x 1-2, calm,  cooperative, and mood-congruent with affect. The patient does not appear to be responding to internal or external stimuli. She is presenting with some delusional thinking. The patient admits to auditory hallucinations in the past but denies visual hallucinations. The patient denies any suicidal, homicidal, or self-harm ideations. The patient is presenting with some psychotic and paranoid behaviors. During an encounter with the patient, she could not answer most questions appropriately. Collateral was obtained from her daughter Charline Bills ([phone]), who shared that her mom's safety is a significant issue at home. Even though they have care at home 24/7, her mom still finds ways to get herself into situations that are harmful to her. Selinda Eon stated they have a baby-proof of her home today. She could still get her hand on things to harm her or leave home when the caregiver goes to the bathroom. Selinda Eon stated that they contacted [facility] in [place], [place], whose staff name is Suanne Marker. The patient's family is requesting assistance with memory care.    HPI: Per Dr. Starleen Blue, Claire Strong is a 61 y.o. female   with past medical history of anxiety disorder, depression, dementia presents today due to concerns for decline in mental status.  Patient was recently admitted to [facility].  She was diagnosed with dementia and April of this year and became psychotic during hospitalization.  She was discharged from [facility] last Tuesday apparently has been wandering getting into the car to drive although her license has been revoked and complaining that they put involving fluid into her.  Patient endorses burning with urination and chronic back pain.  She  otherwise denies chest pain shortness of breath and is alert and oriented.  She says that she was brought in to take the involving fluid out of her and to have her arms and legs cut off.  Past Psychiatric History:   Dementia ([place]) Depression Anxiety  Risk to Self:   Risk to Others:   Prior Inpatient Therapy:   Prior Outpatient Therapy:    Past Medical History:  Past Medical History:  Diagnosis Date   Anxiety    Basal cell carcinoma    nose   Bleeding disorder ([place])    Bronchial asthma    Cancer ([place])    melanoma; right knee   Cavovarus deformity of foot    bilateral, with L 5th bunionette (Dr. Gigi Gin ortho)   Chronic venous insufficiency 2007   s/p vein stripping and laser ablation   Complication of anesthesia    Dementia ([place]) 09/2021   Depression    Diverticulosis    mild by colonoscopy   Headache    Migraines   History of DVT (deep vein thrombosis)    DVTs after 1st pregnancy, not on AC 2/2 bleeding ulcer, greenfield filter in place   History of gastric ulcer 2009   History of kidney stones    Horseshoe kidney    sole, R kidney damage   HTN (hypertension)    Hx of degenerative disc disease    Hypothyroidism    Kidney stones    s/p renal hematoma after lithotripsy on right   Lupus anticoagulant disorder ([place])    Multinodular goiter    s/p thyroidectomy   Nasal septal perforation    chronic, ENT rec avoid antihistamine, INS   Osteoarthritis    ?FM by rheum   Overweight    PONV (postoperative nausea and vomiting)    Post-surgical hypothyroidism    for multinodular goiter   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 Frutoso Chase   Seasonal allergies    allergy shots, singulair   Sight deterioration    disc in back   Sleep apnea    lost weight   Tachycardia     Past Surgical History:  Procedure Laterality Date   ABDOMINAL HERNIA REPAIR     ABDOMINAL HYSTERECTOMY     ANKLE FUSION Left 05/2013   APPENDECTOMY  1978   BACK SURGERY  2009   CESAREAN SECTION     X 2   CHOLECYSTECTOMY  1998   COLONOSCOPY  05/2012   hyperplastic polyps x2, mild diverticulosis Deatra Ina) rpt 10 yrs   COLONOSCOPY WITH PROPOFOL N/A 07/01/2018   Procedure: COLONOSCOPY WITH PROPOFOL;  Surgeon:  Virgel Manifold, MD;  Location: [facility];  Service: Endoscopy;  Laterality: N/A;   CYST EXCISION     back  and shoulder   ESI Right 10/2015, 02/2016   C4/5 then C5/6 ESI ([place])   ESI Bilateral 06/2015, 08/2015, 03/2016, 09/2017   L4/5 transforaminal ESI ([place])   ESOPHAGOGASTRODUODENOSCOPY (EGD) WITH PROPOFOL N/A 07/01/2018   Procedure: ESOPHAGOGASTRODUODENOSCOPY (EGD) WITH PROPOFOL;  Surgeon: Virgel Manifold, MD;  Location: [facility];  Service: Endoscopy;  Laterality: N/A;   FL HIP INJECTION ([place] HX) Left 11/2016   Dr Sharlet Salina   FUSION OF TALONAVICULAR JOINT Left 09/12/2016   Procedure: FUSION OF TALONAVICULAR JOINT WITH TRINITY BONE GRAFT;  Surgeon: Samara Deist, DPM;  Location: [facility];  Service: Podiatry;  Laterality: Left;   GASTRIC BYPASS  05/21/05   Dr. Frutoso Chase, Roux-en-Y   Greenfield filter removal  2015   removed  6 wks after surgery   HAMMER TOE SURGERY Left 10/03/2020   Procedure: HAMMER TOE CORRECTION 2,3,4 AND Z - PLASTY LEFT;  Surgeon: Samara Deist, DPM;  Location: [facility];  Service: Podiatry;  Laterality: Left;   hammerhead toes Left    HERNIA REPAIR     X 3   INCISION AND DRAINAGE OF WOUND Left 10/24/2020   Procedure: IRRIGATION AND DEBRIDEMENT; BELOW FASCIA FOOT; MULTIPLE;  Surgeon: Samara Deist, DPM;  Location: [facility];  Service: Podiatry;  Laterality: Left;  IV with Local   IVC FILTER INSERTION N/A 09/09/2016   Procedure: IVC Filter Insertion;  Surgeon: Katha Cabal, MD;  Location: [facility];  Service: Cardiovascular;  Laterality: N/A;   IVC FILTER REMOVAL N/A 11/25/2016   Procedure: IVC Filter Removal;  Surgeon: Katha Cabal, MD;  Location: [facility];  Service: Cardiovascular;  Laterality: N/A;   JOINT REPLACEMENT     lyphoma excision  08/2015   arm   NASAL SEPTUM SURGERY  1981   deviated septum, repaired at [place]   ovaries remain   SCAR REVISION N/A  08/02/2020   Procedure: ADVANCEMENT FLAP SCAR REVISION;  Surgeon: Margaretha [place], MD;  Location: [facility];  Service: ENT;  Laterality: N/A;   SKIN SURGERY     basel cell   [place] Right 11/06/2015   Procedure: TOTAL HIP ARTHROPLASTY ANTERIOR APPROACH;  Surgeon: Hessie Knows, MD;  Location: [facility];  Service: Orthopedics;  Laterality: Right;   TOTAL HIP ARTHROPLASTY Left 03/19/2017   Procedure: TOTAL HIP ARTHROPLASTY ANTERIOR APPROACH;  Surgeon: Hessie Knows, MD;  Location: [facility];  Service: Orthopedics;  Laterality: Left;   TOTAL KNEE ARTHROPLASTY Left 08/2013   TOTAL THYROIDECTOMY  09/2011   benign path (done for multinodular goiter concern for cancer)   WEIL OSTEOTOMY Left 10/03/2020   Procedure: WEIL OSTEOTOMY 3RD LEFT;  Surgeon: Samara Deist, DPM;  Location: [facility];  Service: Podiatry;  Laterality: Left;   Family History:  Family History  Problem Relation Age of Onset   Diabetes Mother        prediabetes   Alzheimer's disease Mother    Hyperlipidemia Mother    Diabetes Father    Coronary artery disease Father 30       CABG   Alcohol abuse Father    Lung cancer Father    Brain cancer Maternal Uncle    Diabetes Paternal Grandmother    Lung cancer Brother    Alzheimer's disease Brother    Diabetes Maternal Grandmother    Stroke Neg Hx    Colon cancer Neg Hx    Stomach cancer Neg Hx    Kidney disease Neg Hx    Breast cancer Neg Hx    Kidney cancer Neg Hx    Bladder Cancer Neg Hx    Ovarian cancer Neg Hx    Family Psychiatric  History:  Mom and sisters Alzheimers Social History:  Social History   Substance and Sexual Activity  Alcohol Use Yes   Alcohol/week: 0.0 standard drinks of alcohol   Comment: Occasionally-beer or bloody mary monthly     Social History   Substance and Sexual Activity  Drug Use No    Social History   Socioeconomic History   Marital status: Married    Spouse name: Gwyndolyn Saxon    Number of children: 2   Years of education: 12   Highest  education level: High school graduate  Occupational History   Occupation: stay at home  Tobacco Use   Smoking status: Former    Packs/day: 0.25    Types: Cigarettes    Quit date: 06/16/2004    Years since quitting: 17.5   Smokeless tobacco: Never   Tobacco comments:    Socially-no longer  Vaping Use   Vaping Use: Never used  Substance and Sexual Activity   Alcohol use: Yes    Alcohol/week: 0.0 standard drinks of alcohol    Comment: Occasionally-beer or bloody mary monthly   Drug use: No   Sexual activity: Yes    Partners: Male    Birth control/protection: Surgical  Other Topics Concern   Not on file  Social History Narrative   Not on file   Social Determinants of Health   Financial Resource Strain: Low Risk  (08/12/2021)   Overall Financial Resource Strain (CARDIA)    Difficulty of Paying Living Expenses: Not hard at all  Food Insecurity: No Food Insecurity (08/12/2021)   Hunger Vital Sign    Worried About Running Out of Food in the Last Year: Never true    Ran Out of Food in the Last Year: Never true  Transportation Needs: No Transportation Needs (08/12/2021)   PRAPARE - Hydrologist (Medical): No    Lack of Transportation (Non-Medical): No  Physical Activity: Insufficiently Active (08/12/2021)   Exercise Vital Sign    Days of Exercise per Week: 3 days    Minutes of Exercise per Session: 20 min  Stress: Stress Concern Present (08/12/2021)   [employer]    Feeling of Stress : To some extent  Social Connections: Moderately Isolated (08/12/2021)   Social Connection and Isolation Panel [NHANES]    Frequency of Communication with Friends and Family: More than three times a week    Frequency of Social Gatherings with Friends and Family: More than three times a week    Attends Religious Services: Never    Marine scientist  or Organizations: No    Attends Archivist Meetings: Never    Marital Status: Married   Additional Social History:    Allergies:   Allergies  Allergen Reactions   Elemental Sulfur Anaphylaxis   Penicillins Anaphylaxis    Hives and throat swelling   Sulfa Antibiotics Shortness Of Breath   Cefuroxime Axetil Other (See Comments)    Bleeding ulcer   Nutritional Supplements Swelling    Unsure which   Other     All narcotics    Oxycodone-Acetaminophen Nausea And Vomiting   Betadine [Povidone Iodine] Rash   Latex Rash    Labs:  Results for orders placed or performed during the hospital encounter of 12/31/21 (from the past 48 hour(s))  Comprehensive metabolic panel     Status: Abnormal   Collection Time: 12/31/21  4:42 PM  Result Value Ref Range   Sodium 144 135 - 145 mmol/L   Potassium 3.4 (L) 3.5 - 5.1 mmol/L   Chloride 118 (H) 98 - 111 mmol/L   CO2 20 (L) 22 - 32 mmol/L   Glucose, Bld 101 (H) 70 - 99 mg/dL    Comment: Glucose reference range applies only to samples taken after fasting for at least 8 hours.   BUN 18 6 - 20 mg/dL   Creatinine, Ser 0.88 0.44 - 1.00 mg/dL   Calcium 7.9 (L) 8.9 - 10.3 mg/dL   Total Protein  5.9 (L) 6.5 - 8.1 g/dL   Albumin 3.3 (L) 3.5 - 5.0 g/dL   AST 45 (H) 15 - 41 U/L   ALT 67 (H) 0 - 44 U/L   Alkaline Phosphatase 107 38 - 126 U/L   Total Bilirubin 0.5 0.3 - 1.2 mg/dL   GFR, Estimated >60 >60 mL/min    Comment: (NOTE) Calculated using the CKD-EPI Creatinine Equation (2021)    Anion gap 6 5 - 15    Comment: Performed at [facility], [address] [place], [place] [address]  Ethanol     Status: None   Collection Time: 12/31/21  4:42 PM  Result Value Ref Range   Alcohol, Ethyl (B) <10 <10 mg/dL    Comment: (NOTE) Lowest detectable limit for serum alcohol is 10 mg/dL.  For medical purposes only. Performed at [facility], [address] [place], [place] [address]   Salicylate level     Status:  Abnormal   Collection Time: 12/31/21  4:42 PM  Result Value Ref Range   Salicylate Lvl <3.3 (L) 7.0 - 30.0 mg/dL    Comment: Performed at [facility] [address] [place], [place] [address]  Acetaminophen level     Status: Abnormal   Collection Time: 12/31/21  4:42 PM  Result Value Ref Range   Acetaminophen (Tylenol), Serum <10 (L) 10 - 30 ug/mL    Comment: (NOTE) Therapeutic concentrations vary significantly. A range of 10-30 ug/mL  may be an effective concentration for many patients. However, some  are best treated at concentrations outside of this range. Acetaminophen concentrations >150 ug/mL at 4 hours after ingestion  and >50 ug/mL at 12 hours after ingestion are often associated with  toxic reactions.  Performed at [facility], [address] [place], [place] [address]   cbc     Status: None   Collection Time: 12/31/21  4:42 PM  Result Value Ref Range   WBC 5.9 4.0 - 10.5 K/uL   RBC 4.17 3.87 - 5.11 MIL/uL   Hemoglobin 12.4 12.0 - 15.0 g/dL   HCT 37.3 36.0 - 46.0 %   MCV 89.4 80.0 - 100.0 fL   MCH 29.7 26.0 - 34.0 pg   MCHC 33.2 30.0 - 36.0 g/dL   RDW 14.9 11.5 - 15.5 %   Platelets 214 150 - 400 K/uL   nRBC 0.0 0.0 - 0.2 %    Comment: Performed at [facility], [address], [place], [place] [address]  Urine Drug Screen, Qualitative     Status: Abnormal   Collection Time: 12/31/21  5:17 PM  Result Value Ref Range   Tricyclic, Ur Screen POSITIVE (A) NONE DETECTED   Amphetamines, Ur Screen NONE DETECTED NONE DETECTED   MDMA (Ecstasy)Ur Screen NONE DETECTED NONE DETECTED   Cocaine Metabolite,Ur [place] NONE DETECTED NONE DETECTED   Opiate, Ur Screen NONE DETECTED NONE DETECTED   Phencyclidine (PCP) Ur S NONE DETECTED NONE DETECTED   Cannabinoid 50 Ng, Ur  NONE DETECTED NONE DETECTED   Barbiturates, Ur Screen NONE DETECTED NONE DETECTED   Benzodiazepine, Ur Scrn NONE DETECTED NONE DETECTED   Methadone Scn, Ur NONE DETECTED NONE  DETECTED    Comment: (NOTE) Tricyclics + metabolites, urine    Cutoff 1000 ng/mL Amphetamines + metabolites, urine  Cutoff 1000 ng/mL MDMA (Ecstasy), urine              Cutoff 500 ng/mL Cocaine Metabolite, urine  Cutoff 300 ng/mL Opiate + metabolites, urine        Cutoff 300 ng/mL Phencyclidine (PCP), urine         Cutoff 25 ng/mL Cannabinoid, urine                 Cutoff 50 ng/mL Barbiturates + metabolites, urine  Cutoff 200 ng/mL Benzodiazepine, urine              Cutoff 200 ng/mL Methadone, urine                   Cutoff 300 ng/mL  The urine drug screen provides only a preliminary, unconfirmed analytical test result and should not be used for non-medical purposes. Clinical consideration and professional judgment should be applied to any positive drug screen result due to possible interfering substances. A more specific alternate chemical method must be used in order to obtain a confirmed analytical result. Gas chromatography / mass spectrometry (GC/MS) is the preferred confirm atory method. Performed at [facility], [address] [place], [place] [address]   Urinalysis, Routine w reflex microscopic     Status: Abnormal   Collection Time: 12/31/21  5:17 PM  Result Value Ref Range   Color, Urine YELLOW (A) YELLOW   APPearance HAZY (A) CLEAR   Specific Gravity, Urine 1.020 1.005 - 1.030   pH 5.0 5.0 - 8.0   Glucose, UA NEGATIVE NEGATIVE mg/dL   Hgb urine dipstick NEGATIVE NEGATIVE   Bilirubin Urine NEGATIVE NEGATIVE   Ketones, ur NEGATIVE NEGATIVE mg/dL   Protein, ur NEGATIVE NEGATIVE mg/dL   Nitrite NEGATIVE NEGATIVE   Leukocytes,Ua NEGATIVE NEGATIVE    Comment: Performed at [facility], [address] [place], [place] [address]  Resp Panel by RT-PCR (Flu A&B, Covid) Anterior Nasal Swab     Status: None   Collection Time: 12/31/21  6:33 PM   Specimen: Anterior Nasal Swab  Result Value Ref Range   SARS Coronavirus 2 by RT PCR NEGATIVE  NEGATIVE    Comment: (NOTE) SARS-CoV-2 target nucleic acids are NOT DETECTED.  The SARS-CoV-2 RNA is generally detectable in upper respiratory specimens during the acute phase of infection. The lowest concentration of SARS-CoV-2 viral copies this assay can detect is 138 copies/mL. A negative result does not preclude SARS-Cov-2 infection and should not be used as the sole basis for treatment or other patient management decisions. A negative result may occur with  improper specimen collection/handling, submission of specimen other than nasopharyngeal swab, presence of viral mutation(s) within the areas targeted by this assay, and inadequate number of viral copies(<138 copies/mL). A negative result must be combined with clinical observations, patient history, and epidemiological information. The expected result is Negative.  Fact Sheet for Patients:  EntrepreneurPulse.com.au  Fact Sheet for Healthcare Providers:  IncredibleEmployment.be  This test is no t yet approved or cleared by the [place] FDA and  has been authorized for detection and/or diagnosis of SARS-CoV-2 by FDA under an Emergency Use Authorization (EUA). This EUA will remain  in effect (meaning this test can be used) for the duration of the COVID-19 declaration under Section 564(b)(1) of the Act, 21 U.S.C.section 360bbb-3(b)(1), unless the authorization is terminated  or revoked sooner.       Influenza A by PCR NEGATIVE NEGATIVE   Influenza B by PCR NEGATIVE NEGATIVE    Comment: (NOTE) The Xpert Xpress SARS-CoV-2/FLU/RSV plus assay is intended as an aid in the diagnosis of influenza from Nasopharyngeal swab specimens and should not  be used as a sole basis for treatment. Nasal washings and aspirates are unacceptable for Xpert Xpress SARS-CoV-2/FLU/RSV testing.  Fact Sheet for Patients: EntrepreneurPulse.com.au  Fact Sheet for Healthcare  Providers: IncredibleEmployment.be  This test is not yet approved or cleared by the [place] FDA and has been authorized for detection and/or diagnosis of SARS-CoV-2 by FDA under an Emergency Use Authorization (EUA). This EUA will remain in effect (meaning this test can be used) for the duration of the COVID-19 declaration under Section 564(b)(1) of the Act, 21 U.S.C. section 360bbb-3(b)(1), unless the authorization is terminated or revoked.  Performed at [facility], [address] [place], [place] [address]     No current facility-administered medications for this encounter.   Current Outpatient Medications  Medication Sig Dispense Refill   cloZAPine (CLOZARIL) 100 MG tablet Take 200 mg by mouth at bedtime. Take 200 mg by mouth along with 75 mg for total 275 mg daily at bedtime     cloZAPine (CLOZARIL) 25 MG tablet Take 75 mg by mouth at bedtime. Take 75 mg by mouth along with 200 mg for total 275 mg daily at bedtime     Cyanocobalamin (VITAMIN B-12) 1000 MCG SUBL Place 1 tablet (1,000 mcg total) under the tongue daily. 90 tablet 1   cyclobenzaprine (FLEXERIL) 10 MG tablet Take 10 mg by mouth 3 (three) times daily as needed for muscle spasms.     donepezil (ARICEPT) 10 MG tablet Take 1 tablet (10 mg total) by mouth at bedtime. 90 tablet 4   estradiol (ESTRACE) 2 MG tablet Take 2 mg by mouth daily.  0   liothyronine (CYTOMEL) 5 MCG tablet Take 5 mcg by mouth every morning.     metoprolol tartrate (LOPRESSOR) 25 MG tablet Take 1 tablet by mouth twice daily 180 tablet 0   montelukast (SINGULAIR) 10 MG tablet TAKE 1 TABLET BY MOUTH ONCE DAILY IN THE MORNING 90 tablet 0   sertraline (ZOLOFT) 50 MG tablet Take 50 mg by mouth daily.     solifenacin (VESICARE) 5 MG tablet Take 5 mg by mouth daily.     traZODone (DESYREL) 50 MG tablet Take 50 mg by mouth at bedtime.     albuterol (VENTOLIN HFA) 108 (90 Base) MCG/ACT inhaler Inhale 2 puffs into the lungs  every 6 (six) hours as needed for wheezing or shortness of breath. 6.7 g 0   benazepril (LOTENSIN) 10 MG tablet Take 1 tablet (10 mg total) by mouth daily. NO FURTHER REFILLS UNTIL SEEN IN OFFICE. (Patient not taking: Reported on 12/31/2021) 15 tablet 0   cetirizine (ZYRTEC) 10 MG tablet Take 10 mg by mouth daily. (Patient not taking: Reported on 12/31/2021)     citalopram (CELEXA) 40 MG tablet Take 1 tablet by mouth once daily (Patient not taking: Reported on 12/31/2021) 90 tablet 0   cyclobenzaprine (FLEXERIL) 10 MG tablet Take 10 mg by mouth 3 (three) times daily as needed. (Patient not taking: Reported on 12/31/2021)     EPINEPHrine (EPI-PEN) 0.3 mg/0.3 mL DEVI Inject 0.3 mLs (0.3 mg total) into the muscle once. 1 Device 2   QUEtiapine (SEROQUEL) 25 MG tablet Take 1 tablet (25 mg total) by mouth at bedtime. (Patient not taking: Reported on 12/31/2021) 30 tablet 6   SUMAtriptan (IMITREX) 50 MG tablet Take 50 mg by mouth every 2 (two) hours as needed for migraine. May repeat in 2 hours if headache persists or recurs.      Musculoskeletal: Strength & Muscle Tone: within  normal limits Gait & Station: normal Patient leans: N/A  Psychiatric Specialty Exam:  Presentation  General Appearance: Appropriate for Environment  Eye Contact:Minimal  Speech:Other (comment)  Speech Volume:Normal  Handedness:Right   Mood and Affect  Mood:Euthymic  Affect:Congruent   Thought Process  Thought Processes:Other (comment)  Descriptions of Associations:Loose  Orientation:Other (comment)  Thought Content:Illogical; Scattered; Delusions  History of Schizophrenia/Schizoaffective disorder:No  Duration of Psychotic Symptoms:Greater than six months  Hallucinations:Hallucinations: None  Ideas of Reference:None  Suicidal Thoughts:Suicidal Thoughts: No  Homicidal Thoughts:Homicidal Thoughts: No   Sensorium  Memory:Immediate Poor; Recent Poor; Remote Poor  Judgment:Other  (comment)  Insight:Poor   Executive Functions  Concentration:Poor  Attention Span:Poor  Recall:Poor  Fund of Knowledge:Poor  Language:Poor   Psychomotor Activity  Psychomotor Activity:Psychomotor Activity: Normal   Assets  Assets:Communication Skills; Desire for Improvement; Housing; Leisure Time; Physical Health; Resilience; Social Support   Sleep  Sleep:Sleep: Fair   Physical Exam: Physical Exam Vitals and nursing note reviewed.  Constitutional:      Appearance: Normal appearance. She is normal weight.  HENT:     Head: Normocephalic and atraumatic.     Right Ear: External ear normal.     Left Ear: External ear normal.     Nose: Nose normal.  Cardiovascular:     Rate and Rhythm: Normal rate.     Pulses: Normal pulses.  Pulmonary:     Effort: Pulmonary effort is normal.  Musculoskeletal:        General: Normal range of motion.     Cervical back: Normal range of motion and neck supple.  Neurological:     General: No focal deficit present.     Mental Status: She is alert. Mental status is at baseline.  Psychiatric:        Attention and Perception: Attention and perception normal.        Mood and Affect: Mood and affect normal.        Speech: Speech normal.        Behavior: Behavior is cooperative.        Thought Content: Thought content is paranoid.        Cognition and Memory: Cognition is impaired. Memory is impaired.        Judgment: Judgment is impulsive.    Review of Systems  Psychiatric/Behavioral:  Positive for memory loss.    Blood pressure 126/89, pulse 85, temperature 98.1 F (36.7 C), temperature source Oral, resp. rate 16, height 5\' 8"  (1.727 m), weight 93.4 kg, SpO2 97 %. Body mass index is 31.32 kg/m.  Treatment Plan Summary: Medication management and Plan The patient will benefit from a memory care facility due to her safe.  Disposition: No evidence of imminent risk to self or others at present.   Patient does not meet criteria for  psychiatric inpatient admission. Supportive therapy provided about ongoing stressors.   Caroline Sauger, NP 12/31/2021 10:30 PM

## 2021-12-31 NOTE — ED Notes (Addendum)
Pt belongings include: (given to daughter)  Black t shirt Pink slip on shoes Blue and purple shorts  Glasses left on Pt wearing depends

## 2021-12-31 NOTE — ED Provider Notes (Addendum)
[facility] Provider Note    Event Date/Time   First MD Initiated Contact with Patient 12/31/21 1707     (approximate)   History   behavioral eval   HPI  Claire Strong is a 61 y.o. female   with past medical history of anxiety disorder, depression, dementia presents today due to concerns for decline in mental status.  Patient was recently admitted to [facility].  She was diagnosed with dementia and April of this year and became psychotic during hospitalization.  She was discharged from [facility] last Tuesday apparently has been wandering getting into the car to drive although her license has been revoked and complaining that they put involving fluid into her.  Patient endorses burning with urination and chronic back pain.  She otherwise denies chest pain shortness of breath and is alert and oriented.  She says that she was brought in to take the involving fluid out of her and to have her arms and legs cut off.     Past Medical History:  Diagnosis Date   Anxiety    Basal cell carcinoma    nose   Bleeding disorder (HCC)    Bronchial asthma    Cancer ([place])    melanoma; right knee   Cavovarus deformity of foot    bilateral, with L 5th bunionette (Dr. Gigi Gin ortho)   Chronic venous insufficiency 2007   s/p vein stripping and laser ablation   Complication of anesthesia    Dementia ([place]) 09/2021   Depression    Diverticulosis    mild by colonoscopy   Headache    Migraines   History of DVT (deep vein thrombosis)    DVTs after 1st pregnancy, not on AC 2/2 bleeding ulcer, greenfield filter in place   History of gastric ulcer 2009   History of kidney stones    Horseshoe kidney    sole, R kidney damage   HTN (hypertension)    Hx of degenerative disc disease    Hypothyroidism    Kidney stones    s/p renal hematoma after lithotripsy on right   Lupus anticoagulant disorder ([place])    Multinodular goiter    s/p thyroidectomy    Nasal septal perforation    chronic, ENT rec avoid antihistamine, INS   Osteoarthritis    ?FM by rheum   Overweight    PONV (postoperative nausea and vomiting)    Post-surgical hypothyroidism    for multinodular goiter   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 Frutoso Chase   Seasonal allergies    allergy shots, singulair   Sight deterioration    disc in back   Sleep apnea    lost weight   Tachycardia     Patient Active Problem List   Diagnosis Date Noted   Dementia ([place]) 12/31/2021   Memory changes 44/31/5400   Metabolic syndrome 86/76/1950   Moderate episode of recurrent major depressive disorder ([place]) 07/22/2019   GAD (generalized anxiety disorder) 07/22/2019   Obesity 93/26/7124   Eosinophilic esophagitis    Benign neoplasm of ascending colon    Benign neoplasm of cecum    Diverticulosis of large intestine without diverticulitis    History of gastric bypass 06/01/2018   Esophageal dysphagia 05/06/2018   Secondary hyperparathyroidism, non-renal ([place]) 05/02/2017   CKD (chronic kidney disease) stage 2, GFR 60-89 ml/min 05/02/2017   Primary localized osteoarthritis of left hip 03/19/2017   History of DVT (deep vein thrombosis) 08/25/2016   PAD (peripheral  artery disease) ([place]) 08/25/2016   Chronic venous insufficiency 08/25/2016   Iron deficiency anemia 03/25/2016   Primary osteoarthritis of right hip 11/06/2015   History of kidney stones 09/03/2015   Osteoarthritis    Fibromyalgia 02/14/2014   Atrial tachycardia ([place]) 01/25/2013   Hypothyroidism, postsurgical 02/25/2012   Edema 01/28/2012   Headache 09/25/2011   Bronchial asthma    Hot flashes 03/18/2011   Nasal septal perforation    Horseshoe kidney    HTN (hypertension)    Lupus anticoagulant disorder [facility])      Physical Exam  Triage Vital Signs: ED Triage Vitals  Enc Vitals Group     BP 12/31/21 1637 126/89     Pulse Rate 12/31/21 1637 85     Resp 12/31/21 1637 16     Temp 12/31/21 1637 98.1 F (36.7 C)      Temp Source 12/31/21 1637 Oral     SpO2 12/31/21 1637 97 %     Weight 12/31/21 1639 206 lb (93.4 kg)     Height 12/31/21 1639 5\' 8"  (1.727 m)     Head Circumference --      Peak Flow --      Pain Score 12/31/21 1638 7     Pain Loc --      Pain Edu? --      Excl. in [place]? --     Most recent vital signs: Vitals:   12/31/21 1637  BP: 126/89  Pulse: 85  Resp: 16  Temp: 98.1 F (36.7 C)  SpO2: 97%     General: Awake, no distress.  CV:  Good peripheral perfusion.  Resp:  Normal effort.  Abd:  No distention.  Neuro:             Awake, Alert, Oriented x 3  Other:  Aox3, nml speech  PERRL, EOMI, face symmetric, nml tongue movement  5/5 strength in the BL upper and lower extremities  Sensation grossly intact in the BL upper and lower extremities  Finger-nose-finger intact BL Patient is delusional, thinks that she has involving fluid inside of her and that her limbs need to be cut off however she is calm cooperative and not agitated   ED Results / Procedures / Treatments  Labs (all labs ordered are listed, but only abnormal results are displayed) Labs Reviewed  COMPREHENSIVE METABOLIC PANEL - Abnormal; Notable for the following components:      Result Value   Potassium 3.4 (*)    Chloride 118 (*)    CO2 20 (*)    Glucose, Bld 101 (*)    Calcium 7.9 (*)    Total Protein 5.9 (*)    Albumin 3.3 (*)    AST 45 (*)    ALT 67 (*)    All other components within normal limits  SALICYLATE LEVEL - Abnormal; Notable for the following components:   Salicylate Lvl <0.4 (*)    All other components within normal limits  ACETAMINOPHEN LEVEL - Abnormal; Notable for the following components:   Acetaminophen (Tylenol), Serum <10 (*)    All other components within normal limits  URINE DRUG SCREEN, QUALITATIVE ([facility] ONLY) - Abnormal; Notable for the following components:   Tricyclic, Ur Screen POSITIVE (*)    All other components within normal limits  URINALYSIS, ROUTINE W REFLEX  MICROSCOPIC - Abnormal; Notable for the following components:   Color, Urine YELLOW (*)    APPearance HAZY (*)    All other components within normal limits  RESP PANEL  BY RT-PCR (FLU A&B, COVID) ARPGX2  ETHANOL  CBC  POC URINE PREG, ED     EKG     RADIOLOGY    PROCEDURES:  Critical Care performed: No  Procedures   MEDICATIONS ORDERED IN ED: Medications  albuterol (VENTOLIN HFA) 108 (90 Base) MCG/ACT inhaler 2 puff (has no administration in time range)  cloZAPine (CLOZARIL) tablet 200 mg (has no administration in time range)  cloZAPine (CLOZARIL) tablet 75 mg (has no administration in time range)  Vitamin B-12 SUBL 1,000 mcg (has no administration in time range)  donepezil (ARICEPT) tablet 10 mg (has no administration in time range)  estradiol (ESTRACE) tablet 2 mg (has no administration in time range)  liothyronine (CYTOMEL) tablet 5 mcg (has no administration in time range)  metoprolol tartrate (LOPRESSOR) tablet 25 mg (has no administration in time range)  montelukast (SINGULAIR) tablet 10 mg (has no administration in time range)  sertraline (ZOLOFT) tablet 50 mg (has no administration in time range)  darifenacin (ENABLEX) 24 hr tablet 7.5 mg (has no administration in time range)  traZODone (DESYREL) tablet 50 mg (has no administration in time range)     IMPRESSION / MDM / ASSESSMENT AND PLAN / ED COURSE  I reviewed the triage vital signs and the nursing notes.                              Patient's presentation is most consistent with severe exacerbation of chronic illness.  Differential diagnosis includes, but is not limited to, decompensated psychosis, dementia, metabolic encephalopathy, meningitis, encephalitis  The patient is a 61 year old female with recent admission for dementia and psychosis to behavioral health Hospital presents today with progressive decline since discharge last week.  Per triage note patient has been complaining that she has been  vomiting fluid inside of her getting into the car although her license was revoked and she is brought in by her daughter today who is her POA.  The patient is calm and cooperative.  She tells me quite frankly that she is here because she needs to get the involving fluid taken out of her and to have her limbs cut off.  She has a nonfocal neurologic exam.  Her only medical complaint is dysuria and some chronic back pain.  Labs are notable for mildly low bicarb at 20 and slight transaminitis AST 45 ALT 67 but normal bili.  Patient's abdomen is nontender.  UA does not have any pyuria.  Patient's clinical presentation is most consistent with primary psychiatric process.  I have low suspicion for acute medical process causing her symptoms especially given recent admission with similar.  Will consult psychiatry.  Not expressing any desire to leave so we will keep voluntary at this point.  The patient has been placed in psychiatric observation due to the need to provide a safe environment for the patient while obtaining psychiatric consultation and evaluation, as well as ongoing medical and medication management to treat the patient's condition.  The patient has not been placed under full IVC at this time.   Patient was seen by psychiatry and cleared.  She will be referred for social work placement in memory care.  I have ordered her home meds.     FINAL CLINICAL IMPRESSION(S) / ED DIAGNOSES   Final diagnoses:  Delusions ([place])     Rx / DC Orders   ED Discharge Orders     None  Note:  This document was prepared using Dragon voice recognition software and may include unintentional dictation errors.   Rada Hay, MD 12/31/21 1830    Rada Hay, MD 01/01/22 0003

## 2021-12-31 NOTE — ED Notes (Signed)
VOLUNTARY awaiting TTS/PSYCH consult

## 2021-12-31 NOTE — ED Notes (Signed)
Staff went to pt room to obtain urine for UDS, pt stated "good because I think I have a UTI, it burns when I pee"  Staff informed ED MD

## 2021-12-31 NOTE — ED Triage Notes (Signed)
Pt to ED POV with daughter, Benjamine Mola "[name]" who is POA. Confirmed demographic info/phone numbers. Daughter tearful, explaining that pt has had steady decline since discharge from [facility] in [place], [place]. Diagnosed with dementia in 4/23, had psychosis while in hospital in 5/23 while in hospital. Daughter states pt is not doing well, is confused, and continuing to decline since discharged from [facility] last Tuesday. Pt has been wandering, getting into car to drive although license has been revoked. Pt stating "I am fine" and "they put embalming fluid into me" at [place] states hears voices telling her "nothing much, just that I've got embalming fluid in me" and "just stuff". States "they gave me a new bionic body" at the hospital.  Pt states had SI 6 months ago but not currently. Daughter states that pt attempted OD for suicide in 4/23.   Pt recalling long-term events accurately. Pt delusional with auditory hallucinations but answers orientation questions correctly.   Pt and daughter state she is DNR.

## 2022-01-01 ENCOUNTER — Ambulatory Visit: Payer: Medicare Other | Admitting: Oncology

## 2022-01-01 ENCOUNTER — Ambulatory Visit: Payer: Medicare Other

## 2022-01-01 LAB — CBC WITH DIFFERENTIAL/PLATELET
Abs Immature Granulocytes: 0.02 10*3/uL (ref 0.00–0.07)
Basophils Absolute: 0 10*3/uL (ref 0.0–0.1)
Basophils Relative: 1 %
Eosinophils Absolute: 0.1 10*3/uL (ref 0.0–0.5)
Eosinophils Relative: 2 %
HCT: 37.1 % (ref 36.0–46.0)
Hemoglobin: 12.3 g/dL (ref 12.0–15.0)
Immature Granulocytes: 0 %
Lymphocytes Relative: 18 %
Lymphs Abs: 1.1 10*3/uL (ref 0.7–4.0)
MCH: 30.1 pg (ref 26.0–34.0)
MCHC: 33.2 g/dL (ref 30.0–36.0)
MCV: 90.7 fL (ref 80.0–100.0)
Monocytes Absolute: 0.4 10*3/uL (ref 0.1–1.0)
Monocytes Relative: 7 %
Neutro Abs: 4.1 10*3/uL (ref 1.7–7.7)
Neutrophils Relative %: 72 %
Platelets: 228 10*3/uL (ref 150–400)
RBC: 4.09 MIL/uL (ref 3.87–5.11)
RDW: 15.1 % (ref 11.5–15.5)
WBC: 5.7 10*3/uL (ref 4.0–10.5)
nRBC: 0 % (ref 0.0–0.2)

## 2022-01-01 MED ORDER — LIOTHYRONINE SODIUM 5 MCG PO TABS
5.0000 ug | ORAL_TABLET | Freq: Every morning | ORAL | Status: DC
  Administered 2022-01-01 – 2022-01-15 (×15): 5 ug via ORAL
  Filled 2022-01-01 (×15): qty 1

## 2022-01-01 MED ORDER — TRAZODONE HCL 50 MG PO TABS
50.0000 mg | ORAL_TABLET | Freq: Every day | ORAL | Status: DC
  Administered 2022-01-01 – 2022-01-14 (×14): 50 mg via ORAL
  Filled 2022-01-01 (×14): qty 1

## 2022-01-01 MED ORDER — METOPROLOL TARTRATE 25 MG PO TABS
25.0000 mg | ORAL_TABLET | Freq: Two times a day (BID) | ORAL | Status: DC
  Administered 2022-01-01 – 2022-01-12 (×20): 25 mg via ORAL
  Filled 2022-01-01 (×23): qty 1

## 2022-01-01 MED ORDER — ALBUTEROL SULFATE HFA 108 (90 BASE) MCG/ACT IN AERS
2.0000 | INHALATION_SPRAY | Freq: Four times a day (QID) | RESPIRATORY_TRACT | Status: DC | PRN
Start: 2022-01-01 — End: 2022-01-15

## 2022-01-01 MED ORDER — MONTELUKAST SODIUM 10 MG PO TABS
10.0000 mg | ORAL_TABLET | Freq: Every morning | ORAL | Status: DC
  Administered 2022-01-01 – 2022-01-15 (×15): 10 mg via ORAL
  Filled 2022-01-01 (×15): qty 1

## 2022-01-01 MED ORDER — VITAMIN B-12 1000 MCG PO TABS
1000.0000 ug | ORAL_TABLET | Freq: Every day | ORAL | Status: DC
  Administered 2022-01-01 – 2022-01-15 (×15): 1000 ug via ORAL
  Filled 2022-01-01 (×15): qty 1

## 2022-01-01 MED ORDER — DARIFENACIN HYDROBROMIDE ER 7.5 MG PO TB24
7.5000 mg | ORAL_TABLET | Freq: Every day | ORAL | Status: DC
  Administered 2022-01-01 – 2022-01-15 (×15): 7.5 mg via ORAL
  Filled 2022-01-01 (×15): qty 1

## 2022-01-01 MED ORDER — CLOZAPINE 100 MG PO TABS
200.0000 mg | ORAL_TABLET | Freq: Every day | ORAL | Status: DC
  Administered 2022-01-01 – 2022-01-14 (×13): 200 mg via ORAL
  Filled 2022-01-01 (×14): qty 2

## 2022-01-01 MED ORDER — DONEPEZIL HCL 5 MG PO TABS
10.0000 mg | ORAL_TABLET | Freq: Every day | ORAL | Status: DC
  Administered 2022-01-01 – 2022-01-14 (×14): 10 mg via ORAL
  Filled 2022-01-01 (×14): qty 2

## 2022-01-01 MED ORDER — CLOZAPINE 25 MG PO TABS
75.0000 mg | ORAL_TABLET | Freq: Every day | ORAL | Status: DC
  Administered 2022-01-01 – 2022-01-14 (×13): 75 mg via ORAL
  Filled 2022-01-01 (×19): qty 3

## 2022-01-01 MED ORDER — ESTRADIOL 1 MG PO TABS
2.0000 mg | ORAL_TABLET | Freq: Every day | ORAL | Status: DC
  Administered 2022-01-01 – 2022-01-15 (×15): 2 mg via ORAL
  Filled 2022-01-01 (×15): qty 2

## 2022-01-01 MED ORDER — SERTRALINE HCL 50 MG PO TABS
50.0000 mg | ORAL_TABLET | Freq: Every day | ORAL | Status: DC
  Administered 2022-01-01 – 2022-01-15 (×15): 50 mg via ORAL
  Filled 2022-01-01 (×15): qty 1

## 2022-01-01 NOTE — TOC Initial Note (Signed)
Transition of Care [facility]) - Initial/Assessment Note    Patient Details  Name: Claire Strong MRN: [id number] Date of Birth: 03/05/1961  Transition of Care [facility]) CM/SW Contact:    Shelbie Hutching, RN Phone Number: 01/01/2022, 2:45 PM  Clinical Narrative:                 Patient ,brought into the emergency room yesterday by her daughter for increased altered mental status and rapid progressive decline with her dementia.  Patient's mother and aunts all died from dementia.   Patient has been psychiatrically cleared, does not meet criteria for inpatient admission. Patient was discharged from [facility] in [place] psych admission for 60 days.  At discharge they instructed the family that patient would need 24/7 care, patient's daughter, Claire Strong reports that the hospital was unsure about safety of discharge but patient's insurance was no longer going to pay for her stay.  Family has been providing 24/7 care for patient, hired caregivers that is costing $2500/month and the patient's husband at night and the patient's children have been having to call out of work and fill in for care as well. The patient will sneak away from her caregiver if they go to the restroom or she will pull away from them and leave the home when directed not too.  Daughter reports her trying to drive but she has lost her license.  Husband reports that she will wander through the neighbor hood shouting at neighbors that their houses are on fire.  Family is worried that she will get hit by a car.  Daughter Claire Strong reports that the patient makes $2200/month, she has been denied for LTC Medicaid, the worker at [facility] [place] is Antoine Primas.    Daughter agrees to a referral to [employer].  Danielle with [employer] will be reaching out to the daughter today. Daughter has reached out to [employer] in [place] and spoken with Suanne Marker stubbs- [phone]- C, or [place] will reach out to  financial counselor, and Suanne Marker at [employer].   Expected Discharge Plan: Memory Care Barriers to Discharge: Other (must enter comment) (looking for memory care/ ALF)   Patient Goals and CMS Choice Patient states their goals for this hospitalization and ongoing recovery are:: Family just wants for patient to be safe CMS Medicare.gov Compare Post Acute Care list provided to:: Patient Represenative (must comment) Choice offered to / list presented to : [place] / Guardian  Expected Discharge Plan and Services Expected Discharge Plan: Memory Care   Discharge Planning Services: CM Consult   Living arrangements for the past 2 months: Single Family Home                 DME Arranged: N/A DME Agency: NA       HH Arranged: NA HH Agency: NA        Prior Living Arrangements/Services Living arrangements for the past 2 months: Single Family Home Lives with:: Spouse Patient language and need for interpreter reviewed:: Yes Do you feel safe going back to the place where you live?: No   patient wandering around the neighborhood  Need for Family Participation in Patient Care: Yes (Comment) Care giver support system in place?: Yes (comment)   Criminal Activity/Legal Involvement Pertinent to Current Situation/Hospitalization: No - Comment as needed  Activities of Daily Living      Permission Sought/Granted Permission sought to share information with : Case Manager, Family Supports, Chartered certified accountant granted to  share information with : Yes, Verbal Permission Granted  Share Information with NAME: Ilda Foil  Permission granted to share info w AGENCY: [employer] and ALF/Memory Care facilities  Permission granted to share info w Relationship: daughter/HCPOA  Permission granted to share info w Contact Information: [phone]  Emotional Assessment       Orientation: : Oriented to Self Alcohol / Substance Use: Not Applicable Psych Involvement: Yes  (comment), Outpatient Provider  Admission diagnosis:  behave med eval Patient Active Problem List   Diagnosis Date Noted   Dementia ([place]) 12/31/2021   Memory changes 62/13/0865   Metabolic syndrome 78/46/9629   Moderate episode of recurrent major depressive disorder ([place]) 07/22/2019   GAD (generalized anxiety disorder) 07/22/2019   Obesity 52/84/1324   Eosinophilic esophagitis    Benign neoplasm of ascending colon    Benign neoplasm of cecum    Diverticulosis of large intestine without diverticulitis    History of gastric bypass 06/01/2018   Esophageal dysphagia 05/06/2018   Secondary hyperparathyroidism, non-renal ([place]) 05/02/2017   CKD (chronic kidney disease) stage 2, GFR 60-89 ml/min 05/02/2017   Primary localized osteoarthritis of left hip 03/19/2017   History of DVT (deep vein thrombosis) 08/25/2016   PAD (peripheral artery disease) ([place]) 08/25/2016   Chronic venous insufficiency 08/25/2016   Iron deficiency anemia 03/25/2016   Primary osteoarthritis of right hip 11/06/2015   History of kidney stones 09/03/2015   Osteoarthritis    Fibromyalgia 02/14/2014   Atrial tachycardia ([place]) 01/25/2013   Hypothyroidism, postsurgical 02/25/2012   Edema 01/28/2012   Headache 09/25/2011   Bronchial asthma    Hot flashes 03/18/2011   Nasal septal perforation    Horseshoe kidney    HTN (hypertension)    Lupus anticoagulant disorder ([place])    PCP:  Virginia Crews, MD Pharmacy:   [facility] [address], [place] - [address] [place] [address] Phone: [phone] Fax: [phone]     Social Determinants of Health (SDOH) Interventions    Readmission Risk Interventions     No data to display

## 2022-01-01 NOTE — Progress Notes (Signed)
PHARMACIST - PHYSICIAN ORDER COMMUNICATION  Claire Strong is a 61 y.o. year old female with a history of dementia/psychosis  on Clozapine PTA. Continuing this medication order as an inpatient requires that monitoring parameters per REMS requirements must be met.   Clozapine REMS Dispense Authorization was obtained, and will dispense inpatient.  RDA code [id number].  Verified Clozapine dose: 275 mg po at bedtime  Last ANC value and date reported on the Clozapine REMS website: 12/31/21 = 4100/microliter ANC monitoring frequency: once weekly Next ANC reporting is due on  01/07/22.  Claire Strong 01/01/2022, 10:02 AM

## 2022-01-01 NOTE — ED Provider Notes (Signed)
-----------------------------------------   7:14 AM on 01/01/2022 -----------------------------------------   Blood pressure 98/64, pulse 65, temperature 97.9 F (36.6 C), temperature source Oral, resp. rate 17, height 5\' 8"  (1.727 m), weight 93.4 kg, SpO2 95 %.  The patient is calm and cooperative at this time.  There have been no acute events since the last update.  Awaiting disposition plan from Social Work team.   Paulette Blanch, MD 01/01/22 [phone]

## 2022-01-01 NOTE — ED Notes (Signed)
Report received from [place], Conservation officer, nature. Patient alert and oriented, warm and dry, and in no acute distress. Patient denies SI, HI, AVH and pain. Patient made aware of Q15 minute rounds and Engineer, drilling presence for their safety. Patient instructed to come to this nurse with needs or concerns.

## 2022-01-01 NOTE — Consult Note (Signed)
Pharmacy Consult - Clozapine     61 yo female ordered clozapine 275 mg PO qHS  This patient's order has been reviewed for prescribing contraindications.    Clozapine REMS enrollment Verified: no  REMS patient ID:  Current Outpatient Monitoring:    Home Regimen:  275 mg PO q HS Last dose: 7/17 HS   Dose Adjustments This Admission: N/a   Labs: Date    ANC    Submitted? 7/18 4100 Needs submitted        Plan: Continue with ordered home regimen of 275 mg qHS Monitor ANC at least weekly while inpatient

## 2022-01-01 NOTE — BH Assessment (Addendum)
Comprehensive Clinical Assessment (CCA) Screening, Triage and Referral Note  01/01/2022 Claire Strong [id number]  Chief Complaint:  Chief Complaint  Patient presents with   behavioral eval   Visit Diagnosis: Behavioral disturbance   Claire Strong is a 61 year old female who presents to the ER with her daughter. Patient was able to share her name but all other answers wasn't relevant to the questions. Patient is confused and not oriented.  Patient Reported Information How did you hear about [place]? Family/Friend  What Is the Reason for Your Visit/Call Today? Patient history of dementia.  How Long Has This Been Causing You Problems? > than 6 months  What Do You Feel Would Help You the Most Today? Treatment for Depression or other mood problem   Have You Recently Had Any Thoughts About Hurting Yourself? No  Are You Planning to Commit Suicide/Harm Yourself At This time? No   Have you Recently Had Thoughts About [name]? No  Are You Planning to Harm Someone at This Time? No  Explanation: No data recorded  Have You Used Any Alcohol or Drugs in the Past 24 Hours? No  How Long Ago Did You Use Drugs or Alcohol? No data recorded What Did You Use and How Much? No data recorded  Do You Currently Have a Therapist/Psychiatrist? -- (UTA)  Name of Therapist/Psychiatrist: No data recorded  Have You Been Recently Discharged From Any Office Practice or Programs? -- (UTA)  Explanation of Discharge From Practice/Program: No data recorded   CCA Screening Triage Referral Assessment Type of Contact: Face-to-Face  Telemedicine Service Delivery:   Is this Initial or Reassessment? No data recorded Date Telepsych consult ordered in CHL:  No data recorded Time Telepsych consult ordered in CHL:  No data recorded Location of Assessment: No data recorded Provider Location: [facility] ED   Collateral Involvement: None provided   Does Patient Have a [place]? No  data recorded Name and Contact of Legal Guardian: No data recorded If Minor and Not Living with Parent(s), Who has Custody? -- (UTA)  Is CPS involved or ever been involved? Never  Is APS involved or ever been involved? Never   Patient Determined To Be At Risk for Harm To Self or Others Based on Review of Patient Reported Information or Presenting Complaint? No  Method: No data recorded Availability of Means: No data recorded Intent: No data recorded Notification Required: No data recorded Additional Information for Danger to Others Potential: No data recorded Additional Comments for Danger to Others Potential: No data recorded Are There Guns or Other Weapons in Your Home? No data recorded Types of Guns/Weapons: No data recorded Are These Weapons Safely Secured?                            No data recorded Who Could Verify You Are Able To Have These Secured: No data recorded Do You Have any Outstanding Charges, Pending Court Dates, Parole/Probation? No data recorded Contacted To Inform of Risk of Harm To Self or Others: No data recorded  Does Patient Present under Involuntary Commitment? Yes  IVC Papers Initial File Date: 10/22/21   [place] of Residence: [place]   Patient Currently Receiving the Following Services: Medication Management   Determination of Need: Emergent (2 hours)   Options For Referral: ED Referral   Discharge Disposition:    Claire Fusi MS, LCAS, [facility], [facility] Therapeutic Triage Specialist 01/01/2022 12:33 AM

## 2022-01-01 NOTE — ED Notes (Addendum)
Patient was given a sandwhich tray and a cup of soda for snack.

## 2022-01-01 NOTE — ED Notes (Signed)
PT given a snack per request.

## 2022-01-01 NOTE — ED Notes (Signed)
Pt has ambulated to the restroom with no clothes on. Pt educated when finished that she needs to wear clothes, provided to her

## 2022-01-01 NOTE — ED Notes (Signed)
Hospital meal provided.  100% consumed, pt tolerated w/o complaints.  Waste discarded appropriately.   

## 2022-01-01 NOTE — ED Notes (Signed)
VOL  PENDING  PLACEMENT 

## 2022-01-01 NOTE — ED Notes (Signed)
Voluntary / psych consult complete /pt does not meet criteria for psych inpatient admission

## 2022-01-01 NOTE — ED Provider Notes (Deleted)
Emergency Medicine Observation Re-evaluation Note  Claire Strong is a 61 y.o. female, seen on rounds today.  Pt initially presented to the ED for complaints of behavioral eval Currently, the patient is resting, voices no medical complaints.  Physical Exam  BP 126/89   Pulse 85   Temp 98.1 F (36.7 C) (Oral)   Resp 16   Ht 5\' 8"  (1.727 m)   Wt 93.4 kg   SpO2 97%   BMI 31.32 kg/m  Physical Exam General: Resting in no acute distress Cardiac: No cyanosis Lungs: Equal rise and fall Psych: Not agitated  ED Course / MDM  EKG:   I have reviewed the labs performed to date as well as medications administered while in observation.  Recent changes in the last 24 hours include events overnight.  Plan  Current plan is for psychiatric disposition.  Claire Strong is not under involuntary commitment.     Paulette Blanch, MD 01/01/22 [id number]    Paulette Blanch, MD 01/01/22 [phone]

## 2022-01-02 NOTE — ED Notes (Signed)
Claire Strong, Senior Care Advisor with [employer] came to visit with patient. Claire Strong is working with pt family and our case Freight forwarder to aid with safe discharge. Office/mobile [phone]

## 2022-01-02 NOTE — ED Notes (Signed)
Lunch provided.

## 2022-01-02 NOTE — ED Provider Notes (Signed)
Today's Vitals   01/01/22 0655 01/01/22 0836 01/01/22 1915 01/01/22 2018  BP: 98/64   116/82  Pulse: 65   (!) 59  Resp: 17   16  Temp: 97.9 F (36.6 C)   98.6 F (37 C)  TempSrc: Oral   Oral  SpO2: 95%   97%  Weight:      Height:      PainSc:  0-No pain 0-No pain    Body mass index is 31.32 kg/m.   No acute events overnight.  Awaiting social work disposition.   Corleen Otwell, Delice Bison, DO 01/02/22 [phone]

## 2022-01-02 NOTE — ED Notes (Signed)
VOLUNTARY continues to await TOC placement/disposition

## 2022-01-02 NOTE — ED Notes (Signed)
Pt ambulated to bathroom to preform ADL's no assistance required  Offered shower, declined. Offered shower chair to use in shower, declined.

## 2022-01-02 NOTE — ED Notes (Signed)
Patient was given a sandwhich tray and diet soda for snack.

## 2022-01-02 NOTE — ED Notes (Signed)
Meal given

## 2022-01-02 NOTE — ED Notes (Signed)
Breakfast provided.

## 2022-01-03 MED ORDER — POLYETHYLENE GLYCOL 3350 17 G PO PACK
17.0000 g | PACK | Freq: Every day | ORAL | Status: DC | PRN
  Administered 2022-01-03: 17 g via ORAL
  Filled 2022-01-03: qty 1

## 2022-01-03 NOTE — TOC Progression Note (Signed)
Transition of Care [facility]) - Progression Note    Patient Details  Name: Claire Strong MRN: [id number] Date of Birth: 1961/01/08  [facility]) CM/SW Contact  Shelbie Hutching, RN Phone Number: 01/03/2022, 8:59 AM  Clinical Narrative:    RNCM heard back from [facility] in [place], they are unable to accept patient due to her age.  Danielle from [employer] has spoken with the family and they are going to tour a facility this morning, she will update TOC after the tour.    Expected Discharge Plan: Memory Care Barriers to Discharge: Other (must enter comment) (looking for memory care/ ALF)  Expected Discharge Plan and Services Expected Discharge Plan: Memory Care   Discharge Planning Services: CM Consult   Living arrangements for the past 2 months: Single Family Home                 DME Arranged: N/A DME Agency: NA       HH Arranged: NA HH Agency: NA         Social Determinants of Health (SDOH) Interventions    Readmission Risk Interventions     No data to display

## 2022-01-03 NOTE — ED Notes (Signed)
Pt given night time snack- graham crackers, peanut butter, ginger ale

## 2022-01-03 NOTE — ED Notes (Signed)
Vol pending toc

## 2022-01-03 NOTE — ED Provider Notes (Signed)
-----------------------------------------   7:12 AM on 01/03/2022 -----------------------------------------   Blood pressure 128/90, pulse 60, temperature 98.2 F (36.8 C), temperature source Oral, resp. rate 18, height 1.727 m (5\' 8" ), weight 93.4 kg, SpO2 97 %.  The patient is calm and cooperative at this time.  There have been no acute events since the last update.  Awaiting disposition plan from [facility] team.   Hinda Kehr, MD 01/03/22 [phone]

## 2022-01-03 NOTE — Consult Note (Signed)
  Chart reviewed.  Patient has no new complaints.  No change to current status.  Psychiatric team had signed off of this.  Patient can be removed from psychiatric consult list at this time.  Reconsult if necessary.

## 2022-01-03 NOTE — TOC Progression Note (Signed)
Transition of Care [facility]) - Progression Note    Patient Details  Name: Claire Strong MRN: [id number] Date of Birth: [birth year]  Transition of Care [facility]) CM/SW Contact  Shelbie Hutching, RN Phone Number: 01/03/2022, 11:52 AM  Clinical Narrative:    Tour went very well over at [facility] in [place].  Clinicals secure emailed to [facility] with [facility].  [place] would like to do an assessment today.     Expected Discharge Plan: Memory Care Barriers to Discharge: Other (must enter comment) (looking for memory care/ ALF)  Expected Discharge Plan and Services Expected Discharge Plan: Memory Care   Discharge Planning Services: CM Consult   Living arrangements for the past 2 months: Single Family Home                 DME Arranged: N/A DME Agency: NA       HH Arranged: NA HH Agency: NA         Social Determinants of Health (SDOH) Interventions    Readmission Risk Interventions     No data to display

## 2022-01-03 NOTE — ED Notes (Signed)
Pt given breakfast tray and drink at this time.

## 2022-01-04 NOTE — ED Notes (Signed)
Pt given lunch tray and beverage 

## 2022-01-04 NOTE — ED Notes (Signed)
Report received from [name], Conservation officer, nature. On initial round after report Pt is warm/dry, resting quietly in room without any s/s of distress.  Will continue to monitor throughout shift as ordered for any changes in behaviors and for continued safety.

## 2022-01-04 NOTE — ED Notes (Signed)
Patient given snack.  

## 2022-01-04 NOTE — ED Notes (Signed)
VOL / pending TOC placement 

## 2022-01-04 NOTE — ED Notes (Signed)
This tech helped pt with ADL's. Pt brushed teeth, hair, and washed face. Pt was helped to the restroom and peri care was provided.

## 2022-01-05 NOTE — ED Notes (Signed)
Pt given breakfast tray and beverage.

## 2022-01-05 NOTE — ED Notes (Signed)
Report received from [employer] Patient alert and oriented, warm and dry, and in no acute distress. Patient denies SI, HI, AVH and pain. Patient made aware of Q15 minute rounds and Engineer, drilling presence for their safety. Patient instructed to come to this nurse with needs or concerns.

## 2022-01-05 NOTE — ED Provider Notes (Signed)
-----------------------------------------   6:12 AM on 01/05/2022 -----------------------------------------   Vitals:   01/04/22 1030 01/04/22 2157  BP: (!) 131/93 131/90  Pulse: 66 60  Resp: 18 16  Temp: 97.8 F (36.6 C) 97.7 F (36.5 C)  SpO2: 95% 96%     The patient is calm and cooperative during shift, she is currently sleeping without distress on bed and hallway.  There have been no acute events since the last update.  Awaiting disposition plan from [facility] team.     Delman Kitten, MD 01/05/22 [phone]

## 2022-01-05 NOTE — ED Notes (Signed)
Pt awake, given breakfast tray, resting, NAD

## 2022-01-05 NOTE — ED Notes (Signed)
VOl TOC placement

## 2022-01-05 NOTE — ED Notes (Signed)
VOL/TOC Placement Pending

## 2022-01-05 NOTE — ED Provider Notes (Signed)
    01/05/2022   11:17 AM 01/04/2022    9:57 PM 01/04/2022   10:30 AM  Vitals with BMI  Systolic [phone]  Diastolic 93 90 93  Pulse 70 60 66    Patient resting in hallway stretcher. No acute changes this shift. Awaiting TOC placement to memory care.    Rada Hay, MD 01/05/22 [phone]

## 2022-01-06 NOTE — ED Provider Notes (Signed)
-----------------------------------------   7:49 AM on 01/06/2022 -----------------------------------------  Vitals:   01/05/22 1117 01/05/22 1938  BP: (!) 124/93 107/80  Pulse: 70 72  Resp: 20 20  Temp: (!) 97.4 F (36.3 C) 98 F (36.7 C)  SpO2: 100% 95%    Resting comfortably without distress in stretcher.  Even unlabored respirations.  Warm well-perfused extremities.    The patient is calm and cooperative during shift, she is currently sleeping without distress on bed and hallway at this time.  There have been no acute events since the last update.  Awaiting disposition plan from [facility] team.     Delman Kitten, MD 01/06/22 [phone]

## 2022-01-06 NOTE — TOC Progression Note (Signed)
Transition of Care [facility]) - Progression Note    Patient Details  Name: Claire Strong MRN: [id number] Date of Birth: 06-12-1961  Transition of Care [facility]) CM/SW Contact  Shelbie Hutching, RN Phone Number: 01/06/2022, 2:26 PM  Clinical Narrative:    Received a call from [facility] with [facility], the facility that they toured last week unfortunately is unable to accept patient because of her age.  The family will be touring another facility tomorrow in [place].     Expected Discharge Plan: Memory Care Barriers to Discharge: Other (must enter comment) (looking for memory care/ ALF)  Expected Discharge Plan and Services Expected Discharge Plan: Memory Care   Discharge Planning Services: CM Consult   Living arrangements for the past 2 months: Single Family Home                 DME Arranged: N/A DME Agency: NA       HH Arranged: NA HH Agency: NA         Social Determinants of Health (SDOH) Interventions    Readmission Risk Interventions     No data to display

## 2022-01-06 NOTE — ED Notes (Signed)
Pt given a snack

## 2022-01-06 NOTE — ED Notes (Signed)
VOL  PENDING  PLACEMENT 

## 2022-01-07 LAB — CBC WITH DIFFERENTIAL/PLATELET
Abs Immature Granulocytes: 0.02 10*3/uL (ref 0.00–0.07)
Basophils Absolute: 0 10*3/uL (ref 0.0–0.1)
Basophils Relative: 1 %
Eosinophils Absolute: 0.1 10*3/uL (ref 0.0–0.5)
Eosinophils Relative: 2 %
HCT: 38.4 % (ref 36.0–46.0)
Hemoglobin: 12.5 g/dL (ref 12.0–15.0)
Immature Granulocytes: 1 %
Lymphocytes Relative: 29 %
Lymphs Abs: 1.1 10*3/uL (ref 0.7–4.0)
MCH: 30.1 pg (ref 26.0–34.0)
MCHC: 32.6 g/dL (ref 30.0–36.0)
MCV: 92.5 fL (ref 80.0–100.0)
Monocytes Absolute: 0.3 10*3/uL (ref 0.1–1.0)
Monocytes Relative: 7 %
Neutro Abs: 2.4 10*3/uL (ref 1.7–7.7)
Neutrophils Relative %: 60 %
Platelets: 157 10*3/uL (ref 150–400)
RBC: 4.15 MIL/uL (ref 3.87–5.11)
RDW: 14.6 % (ref 11.5–15.5)
WBC: 3.9 10*3/uL — ABNORMAL LOW (ref 4.0–10.5)
nRBC: 0 % (ref 0.0–0.2)

## 2022-01-07 NOTE — ED Notes (Signed)
Breakfast tray provided with beverage

## 2022-01-07 NOTE — ED Notes (Signed)
Dinner tray and beverage provided to pt. Sitting up on side of stretcher with tray on bedside table.

## 2022-01-07 NOTE — ED Notes (Signed)
Evening snack and beverage given. Pt. ate 100%

## 2022-01-07 NOTE — ED Notes (Signed)
Lunch tray and beverage provided

## 2022-01-07 NOTE — ED Notes (Addendum)
Pt resting quietly with eyes closed and even respirations

## 2022-01-07 NOTE — ED Notes (Signed)
VOL/Pending Placement 

## 2022-01-07 NOTE — ED Notes (Signed)
Pt. Alert and oriented, warm and dry, in no distress. Pt. Denies SI, HI, and AVH. Pt complained she was stung by a bee and needed an Epi pen.  No area of redness where patient states she was stung at, respirations even and non labored. Pt. Encouraged to let nursing staff know of any concerns or needs.  ENVIRONMENTAL ASSESSMENT Potentially harmful objects out of patient reach: Yes.   Personal belongings secured: Yes.   Patient dressed in hospital provided attire only: Yes.   Plastic bags out of patient reach: Yes.   Patient care equipment (cords, cables, call bells, lines, and drains) shortened, removed, or accounted for: Yes.   Equipment and supplies removed from bottom of stretcher: Yes.   Potentially toxic materials out of patient reach: Yes.   Sharps container removed or out of patient reach: Yes.

## 2022-01-07 NOTE — ED Notes (Signed)
VOL  PENDING  PLACEMENT 

## 2022-01-07 NOTE — Progress Notes (Signed)
Pharmacy - Clozapine     This patient's order has been reviewed for prescribing contraindications.   Clozapine REMS enrollment Verified: RDA code [id number]. Current Outpatient Monitoring: weekly  Home Regimen:  275 mg po at bedtime  Dose Adjustments This Admission: none  Labs:  [place] 12/31/21: 4100 K/uL 01/07/22: 2400 K/uL   Plan: Continue with weekly ANC labs while inpatient. Next [place] 01/14/2022  **The medication is being dispensed pursuant to the FDA REMS suspension order of 05/04/20 that allows for dispensing without a patient REMS dispense authorization (RDA).    Glean Salvo, PharmD Clinical Pharmacist  01/07/2022 10:26 AM

## 2022-01-08 ENCOUNTER — Inpatient Hospital Stay: Payer: Medicare Other | Admitting: Family Medicine

## 2022-01-08 NOTE — ED Notes (Signed)
Hospital meal provided, pt tolerated w/o complaints.  Waste discarded appropriately.  

## 2022-01-08 NOTE — ED Notes (Signed)
Pt ambulated to bathroom with assistive device (walker) to preform ADL's no assistance required

## 2022-01-08 NOTE — ED Notes (Signed)
Report received from Kimberly S , RN including SBAR. On initial round after report Pt is warm/dry, resting quietly in room without any s/s of distress.  Will continue to monitor throughout shift as ordered for any changes in behaviors and for continued safety.   

## 2022-01-08 NOTE — ED Notes (Signed)
Pt up to use restroom.

## 2022-01-08 NOTE — ED Provider Notes (Signed)
-----------------------------------------   6:52 AM on 01/08/2022 -----------------------------------------   Blood pressure 106/64, pulse (!) 58, temperature 98.2 F (36.8 C), resp. rate 16, height 1.727 m (5\' 8" ), weight 93.4 kg, SpO2 96 %.  The patient is calm and cooperative at this time.  There have been no acute events since the last update.  Awaiting disposition plan from [facility] team.   Hinda Kehr, MD 01/08/22 [phone]

## 2022-01-08 NOTE — ED Notes (Signed)
Pt given drink at this time 

## 2022-01-08 NOTE — ED Notes (Signed)
Sw interviewer for placement at bedside

## 2022-01-09 NOTE — ED Notes (Signed)
Pt ambulated to restroom independently, pt states "I may need help cleaning the embalming fluid off of me" pts ER bed was switched for hospital bed while she was in restroom, pt returned to bed and given warm blanket

## 2022-01-09 NOTE — ED Notes (Signed)
VOL/pending placement 

## 2022-01-09 NOTE — TOC Progression Note (Signed)
Transition of Care [facility]) - Progression Note    Patient Details  Name: Claire Strong MRN: [id number] Date of Birth: 05/21/1961  [facility]) CM/SW Contact  Shelbie Hutching, RN Phone Number: 01/09/2022, 3:03 PM  Clinical Narrative:    Updated from [facility] with [employer] on placement.  Family did not go tour in [place] they hope to get patient closer to this area.  Waiting to hear from Rol[facility] Infante to see if they will come and assess.  [facility] said no to referral for any of their facilities.    Expected Discharge Plan: Memory Care Barriers to Discharge: Other (must enter comment) (looking for memory care/ ALF)  Expected Discharge Plan and Services Expected Discharge Plan: Memory Care   Discharge Planning Services: CM Consult   Living arrangements for the past 2 months: Single Family Home                 DME Arranged: N/A DME Agency: NA       HH Arranged: NA HH Agency: NA         Social Determinants of Health (SDOH) Interventions    Readmission Risk Interventions     No data to display

## 2022-01-09 NOTE — ED Notes (Signed)
VOL  PENDING  PLACEMENT 

## 2022-01-09 NOTE — ED Notes (Signed)
Pt given breakfast tray

## 2022-01-09 NOTE — ED Notes (Signed)
Pt given dinner tray.
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
## 2022-01-10 MED ORDER — ACETAMINOPHEN 500 MG PO TABS
1000.0000 mg | ORAL_TABLET | Freq: Once | ORAL | Status: AC
  Administered 2022-01-10: 1000 mg via ORAL
  Filled 2022-01-10: qty 2

## 2022-01-10 NOTE — ED Notes (Signed)
Pt requesting some graham crackers and peanut butter

## 2022-01-10 NOTE — ED Notes (Signed)
Pt assited to the bathroom, states that she doesn't have the urge to go but knows that she should and she needs to get cleaned up, as pt is moving to sit up in bed she states "ow, another bee stung me" no bees noted around the pt. Pt uses walker with ease to the bathroom, new scrub pants and depends given to pt, pt assisted to clean up, pt voided in the toilet, pt's bed linens removed, bed cleaned, new linens placed on bed

## 2022-01-10 NOTE — ED Provider Notes (Signed)
-----------------------------------------   6:41 AM on 01/10/2022 -----------------------------------------   Blood pressure 99/68, pulse 66, temperature 98.8 F (37.1 C), temperature source Oral, resp. rate 18, height 5\' 8"  (1.727 m), weight 93.4 kg, SpO2 91 %.  The patient is calm and cooperative at this time.  There have been no acute events since the last update.  Awaiting disposition plan from Social Work team.   Paulette Blanch, MD 01/10/22 [phone]

## 2022-01-10 NOTE — ED Notes (Signed)
Pt given nighttime snack. 

## 2022-01-10 NOTE — ED Notes (Signed)
Pt given breakfast.

## 2022-01-10 NOTE — ED Notes (Signed)
Vol pending placement  see note

## 2022-01-11 NOTE — ED Notes (Signed)
Pt given breakfast tray

## 2022-01-11 NOTE — ED Notes (Signed)
Patient incontinent of urine.  Linens changes and patient cleansed and given new scrubs.

## 2022-01-11 NOTE — ED Provider Notes (Signed)
-----------------------------------------   5:32 AM on 01/11/2022 -----------------------------------------   Blood pressure 99/70, pulse 66, temperature 99 F (37.2 C), resp. rate 15, height 5\' 8"  (1.727 m), weight 93.4 kg, SpO2 94 %.  The patient is calm and cooperative at this time.  There have been no acute events since the last update.  Awaiting disposition plan from Social Work team.   Paulette Blanch, MD 01/11/22 [phone]

## 2022-01-11 NOTE — ED Notes (Signed)
Patient is vol pending placement 

## 2022-01-12 DIAGNOSIS — F03B2 Unspecified dementia, moderate, with psychotic disturbance: Secondary | ICD-10-CM

## 2022-01-12 DIAGNOSIS — Z9884 Bariatric surgery status: Secondary | ICD-10-CM | POA: Diagnosis not present

## 2022-01-12 DIAGNOSIS — R413 Other amnesia: Secondary | ICD-10-CM | POA: Diagnosis not present

## 2022-01-12 DIAGNOSIS — E669 Obesity, unspecified: Secondary | ICD-10-CM

## 2022-01-12 DIAGNOSIS — E039 Hypothyroidism, unspecified: Secondary | ICD-10-CM | POA: Diagnosis not present

## 2022-01-12 DIAGNOSIS — I10 Essential (primary) hypertension: Secondary | ICD-10-CM | POA: Diagnosis not present

## 2022-01-12 DIAGNOSIS — E890 Postprocedural hypothyroidism: Secondary | ICD-10-CM | POA: Diagnosis not present

## 2022-01-12 DIAGNOSIS — K200 Eosinophilic esophagitis: Secondary | ICD-10-CM

## 2022-01-12 DIAGNOSIS — I739 Peripheral vascular disease, unspecified: Secondary | ICD-10-CM

## 2022-01-12 DIAGNOSIS — F411 Generalized anxiety disorder: Secondary | ICD-10-CM

## 2022-01-12 DIAGNOSIS — F331 Major depressive disorder, recurrent, moderate: Secondary | ICD-10-CM

## 2022-01-12 DIAGNOSIS — Z6832 Body mass index (BMI) 32.0-32.9, adult: Secondary | ICD-10-CM

## 2022-01-12 DIAGNOSIS — J4521 Mild intermittent asthma with (acute) exacerbation: Secondary | ICD-10-CM | POA: Diagnosis not present

## 2022-01-12 DIAGNOSIS — N179 Acute kidney failure, unspecified: Secondary | ICD-10-CM | POA: Diagnosis not present

## 2022-01-12 DIAGNOSIS — N182 Chronic kidney disease, stage 2 (mild): Secondary | ICD-10-CM | POA: Diagnosis present

## 2022-01-12 LAB — COMPREHENSIVE METABOLIC PANEL
ALT: 51 U/L — ABNORMAL HIGH (ref 0–44)
AST: 43 U/L — ABNORMAL HIGH (ref 15–41)
Albumin: 3.1 g/dL — ABNORMAL LOW (ref 3.5–5.0)
Alkaline Phosphatase: 122 U/L (ref 38–126)
Anion gap: 9 (ref 5–15)
BUN: 31 mg/dL — ABNORMAL HIGH (ref 6–20)
CO2: 18 mmol/L — ABNORMAL LOW (ref 22–32)
Calcium: 7.9 mg/dL — ABNORMAL LOW (ref 8.9–10.3)
Chloride: 113 mmol/L — ABNORMAL HIGH (ref 98–111)
Creatinine, Ser: 1.2 mg/dL — ABNORMAL HIGH (ref 0.44–1.00)
GFR, Estimated: 52 mL/min — ABNORMAL LOW (ref >60)
Glucose, Bld: 138 mg/dL — ABNORMAL HIGH (ref 70–99)
Potassium: 4 mmol/L (ref 3.5–5.1)
Sodium: 140 mmol/L (ref 135–145)
Total Bilirubin: 1 mg/dL (ref 0.3–1.2)
Total Protein: 5.9 g/dL — ABNORMAL LOW (ref 6.5–8.1)

## 2022-01-12 LAB — CBC WITH DIFFERENTIAL/PLATELET
Abs Immature Granulocytes: 0.03 10*3/uL (ref 0.00–0.07)
Basophils Absolute: 0 10*3/uL (ref 0.0–0.1)
Basophils Relative: 0 %
Eosinophils Absolute: 0.1 10*3/uL (ref 0.0–0.5)
Eosinophils Relative: 2 %
HCT: 42.1 % (ref 36.0–46.0)
Hemoglobin: 13.1 g/dL (ref 12.0–15.0)
Immature Granulocytes: 0 %
Lymphocytes Relative: 15 %
Lymphs Abs: 1.1 10*3/uL (ref 0.7–4.0)
MCH: 29.6 pg (ref 26.0–34.0)
MCHC: 31.1 g/dL (ref 30.0–36.0)
MCV: 95 fL (ref 80.0–100.0)
Monocytes Absolute: 0.4 10*3/uL (ref 0.1–1.0)
Monocytes Relative: 5 %
Neutro Abs: 5.7 10*3/uL (ref 1.7–7.7)
Neutrophils Relative %: 78 %
Platelets: 183 10*3/uL (ref 150–400)
RBC: 4.43 MIL/uL (ref 3.87–5.11)
RDW: 14.6 % (ref 11.5–15.5)
WBC: 7.4 10*3/uL (ref 4.0–10.5)
nRBC: 0 % (ref 0.0–0.2)

## 2022-01-12 LAB — TSH: TSH: 149.738 u[IU]/mL — ABNORMAL HIGH (ref 0.350–4.500)

## 2022-01-12 LAB — LACTIC ACID, PLASMA: Lactic Acid, Venous: 1.9 mmol/L (ref 0.5–1.9)

## 2022-01-12 LAB — T4, FREE: Free T4: <0.25 ng/dL — ABNORMAL LOW (ref 0.61–1.12)

## 2022-01-12 MED ORDER — LACTATED RINGERS IV BOLUS
1000.0000 mL | Freq: Once | INTRAVENOUS | Status: AC
  Administered 2022-01-12: 1000 mL via INTRAVENOUS

## 2022-01-12 MED ORDER — ACETAMINOPHEN 650 MG RE SUPP: 650.0000 mg | Freq: Four times a day (QID) | RECTAL | Status: DC | PRN

## 2022-01-12 MED ORDER — PANTOPRAZOLE SODIUM 40 MG PO TBEC
40.0000 mg | DELAYED_RELEASE_TABLET | Freq: Every day | ORAL | Status: DC
  Administered 2022-01-13 – 2022-01-15 (×3): 40 mg via ORAL
  Filled 2022-01-12 (×3): qty 1

## 2022-01-12 MED ORDER — SODIUM CHLORIDE 0.9 % IV BOLUS
1000.0000 mL | Freq: Once | INTRAVENOUS | Status: AC
  Administered 2022-01-12: 1000 mL via INTRAVENOUS

## 2022-01-12 MED ORDER — SODIUM CHLORIDE 0.9% FLUSH
3.0000 mL | Freq: Two times a day (BID) | INTRAVENOUS | Status: DC
  Administered 2022-01-13 – 2022-01-15 (×5): 3 mL via INTRAVENOUS

## 2022-01-12 MED ORDER — FLUTICASONE FUROATE-VILANTEROL 100-25 MCG/ACT IN AEPB
1.0000 | INHALATION_SPRAY | Freq: Every day | RESPIRATORY_TRACT | Status: DC
  Administered 2022-01-13 – 2022-01-15 (×3): 1 via RESPIRATORY_TRACT
  Filled 2022-01-12: qty 28

## 2022-01-12 MED ORDER — ENOXAPARIN SODIUM 40 MG/0.4ML IJ SOSY
40.0000 mg | PREFILLED_SYRINGE | INTRAMUSCULAR | Status: DC
Start: 2022-01-12 — End: 2022-01-15
  Administered 2022-01-12 – 2022-01-14 (×3): 40 mg via SUBCUTANEOUS
  Filled 2022-01-12 (×3): qty 0.4

## 2022-01-12 MED ORDER — LEVOTHYROXINE SODIUM 100 MCG PO TABS
175.0000 ug | ORAL_TABLET | Freq: Every day | ORAL | Status: DC
  Administered 2022-01-13 – 2022-01-15 (×3): 175 ug via ORAL
  Filled 2022-01-12 (×2): qty 1
  Filled 2022-01-12: qty 4

## 2022-01-12 MED ORDER — ACETAMINOPHEN 325 MG PO TABS: 650.0000 mg | ORAL_TABLET | Freq: Four times a day (QID) | ORAL | Status: DC | PRN

## 2022-01-12 MED ORDER — LEVOTHYROXINE SODIUM 100 MCG/5ML IV SOLN
130.0000 ug | INTRAVENOUS | Status: AC
  Administered 2022-01-12: 130 ug via INTRAVENOUS
  Filled 2022-01-12: qty 10

## 2022-01-12 NOTE — ED Notes (Signed)
Pt given breakfast.

## 2022-01-12 NOTE — ED Notes (Signed)
VOL / pending TOC placement 

## 2022-01-12 NOTE — ED Provider Notes (Signed)
I was notified by bedside RN that patient's blood pressures were low when checked this evening.  On my assessment patient is hypotensive with systolic in the 16X.  She is awake and alert denying any acute sick symptoms.  Has slightly dry mucous membranes.  Her lungs are clear bilaterally her abdomen is soft.  We will give some IV fluids and check for any evidence of electrolyte derangements, kidney injury as well as obtain EKG to assess for evidence of arrhythmia.  I will also order UA to check for any UTI.  CBC without leukocytosis or acute anemia.  CMP without any significant acute electrolyte or metabolic derangements.  Lactic acid is not elevated.  TSH 149 Free T4 undetectable.  I am concerned that patient has fairly severe hypothyroidism that is cramping untreated as it seems she has not restarted her Synthroid.  She is still borderline hypotensive after liter of fluid and we will give her an IV dose after discussion pharmacy.  She will be admitted to medicine service for further evaluation and management.    Lucrezia Starch, MD 01/12/22 [phone]

## 2022-01-12 NOTE — ED Provider Notes (Signed)
Today's Vitals   01/10/22 0553 01/10/22 0908 01/10/22 2002 01/11/22 1704  BP:  104/75 99/70 (!) 140/95  Pulse:  62 66 60  Resp:  16 15 16   Temp:  (!) 97.5 F (36.4 C) 99 F (37.2 C) 97.6 F (36.4 C)  TempSrc:  Oral  Oral  SpO2:  95% 94% 97%  Weight:      Height:      PainSc: 10-Worst pain ever      Body mass index is 31.32 kg/m.    No acute events.  Awaiting social work disposition.   Leshawn Houseworth, Delice Bison, DO 01/12/22 [phone]

## 2022-01-12 NOTE — ED Notes (Signed)
MD notified of patient's blood pressure. 
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
## 2022-01-12 NOTE — H&P (Signed)
History and Physical   FLORETTE THAI IFO:[id number] DOB: [birth year] DOA: 12/31/2021  PCP: Virginia Crews, MD   Patient coming from: ED  Chief Complaint: Hypotension, Hypothyroidism  HPI: Claire Strong is a 62 y.o. female with medical history significant of lupus anticoagulant, asthma, fibromyalgia, depression, anxiety, dementia, hypertension, hypothyroidism, anemia, history of DVT, peripheral arterial disease, CKD 2, dysphagia, eosinophilic esophagitis, diverticulosis, obesity presenting with hypotension and hypothyroidism.  Patient initially presented to the ED on 7/18.  Patient presented with history of diagnosis of dementia in April and having come psychotic during recent hospitalization.  Patient had decline in mentation and thought processes at home and came to the ED for further evaluation.  Patient stated that she come to the hospital to have her arms and legs cut off.  She was evaluated by psychiatry and is found to be not a candidate for inpatient psych.  She subsequently had been waiting on placement in the ED for the past almost 2 weeks.  She had been stable in the ED.  This evening she was noted to be hypotensive with systolics in the 67E and had been in the 90s to as high as 140s since admission.  Patient received IV fluid bolus and labs were checked including TSH due to her history of hypothyroidism.  TSH came back at 149 and free T4 at less than 0.25.  Patient then was given dose of IV Synthroid and restarted on her home Synthroid which had not yet been restarted as it was not in our EMR but was located in outside records from her endocrinologist office.  Admission requested due to patient's new hypotensive episode in the setting of severe hypothyroidism concern for risk of destabilization and needing to be better medically stabilized prior to being placed.  ED Course: Vital signs in the ED as above have been stable with systolics in the 72C to 947S however today developed  hypotension in the 96G systolic.  Otherwise stable.  Lab work-up today showed bicarb 18, creatinine was 1.2 from baseline around 0.9, glucose 138, BUN 31, chloride 113, calcium 7.9, protein 5.9, albumin 3.1, AST 43, ALT 51.  CBC with within normal limits.  As above TSH 149, T4 less than 0.25, T3 pending.  Lactic acid normal.  Urinalysis pending.  As above patient received a liter of fluids and dose of IV Synthroid.  Patient had been receiving home meds otherwise in the ED.  Review of Systems: As per HPI otherwise all other systems reviewed and are negative.  Past Medical History:  Diagnosis Date   Anxiety    Basal cell carcinoma    nose   Bleeding disorder (HCC)    Bronchial asthma    Cancer ([place])    melanoma; right knee   Cavovarus deformity of foot    bilateral, with L 5th bunionette (Dr. Gigi Gin ortho)   Chronic venous insufficiency 2007   s/p vein stripping and laser ablation   Complication of anesthesia    Dementia ([place]) 09/2021   Depression    Diverticulosis    mild by colonoscopy   Headache    Migraines   History of DVT (deep vein thrombosis)    DVTs after 1st pregnancy, not on AC 2/2 bleeding ulcer, greenfield filter in place   History of gastric ulcer 2009   History of kidney stones    Horseshoe kidney    sole, R kidney damage   HTN (hypertension)    Hx of degenerative disc disease  Hypothyroidism    Kidney stones    s/p renal hematoma after lithotripsy on right   Lupus anticoagulant disorder (HCC)    Multinodular goiter    s/p thyroidectomy   Nasal septal perforation    chronic, ENT rec avoid antihistamine, INS   Osteoarthritis    ?FM by rheum   Overweight    PONV (postoperative nausea and vomiting)    Post-surgical hypothyroidism    for multinodular goiter   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   Dr. Frutoso Chase   Seasonal allergies    allergy shots, singulair   Sight deterioration    disc in back   Sleep apnea    lost weight   Tachycardia     Past  Surgical History:  Procedure Laterality Date   ABDOMINAL HERNIA REPAIR     ABDOMINAL HYSTERECTOMY     ANKLE FUSION Left 05/2013   APPENDECTOMY  1978   BACK SURGERY  2009   CESAREAN SECTION     X 2   CHOLECYSTECTOMY  1998   COLONOSCOPY  05/2012   hyperplastic polyps x2, mild diverticulosis Deatra Ina) rpt 10 yrs   COLONOSCOPY WITH PROPOFOL N/A 07/01/2018   Procedure: COLONOSCOPY WITH PROPOFOL;  Surgeon: Virgel Manifold, MD;  Location: [facility];  Service: Endoscopy;  Laterality: N/A;   CYST EXCISION     back  and shoulder   ESI Right 10/2015, 02/2016   C4/5 then C5/6 ESI ([place])   ESI Bilateral 06/2015, 08/2015, 03/2016, 09/2017   L4/5 transforaminal ESI ([place])   ESOPHAGOGASTRODUODENOSCOPY (EGD) WITH PROPOFOL N/A 07/01/2018   Procedure: ESOPHAGOGASTRODUODENOSCOPY (EGD) WITH PROPOFOL;  Surgeon: Virgel Manifold, MD;  Location: [facility];  Service: Endoscopy;  Laterality: N/A;   FL HIP INJECTION ([place] HX) Left 11/2016   Dr Sharlet [place]   FUSION OF TALONAVICULAR JOINT Left 09/12/2016   Procedure: FUSION OF TALONAVICULAR JOINT WITH TRINITY BONE GRAFT;  Surgeon: Samara Deist, DPM;  Location: [facility];  Service: Podiatry;  Laterality: Left;   GASTRIC BYPASS  05/21/05   Dr. Frutoso Chase, Roux-en-Y   Greenfield filter removal  2015   removed 6 wks after surgery   HAMMER TOE SURGERY Left 10/03/2020   Procedure: HAMMER TOE CORRECTION 2,3,4 AND Z - PLASTY LEFT;  Surgeon: Samara Deist, DPM;  Location: [place];  Service: Podiatry;  Laterality: Left;   hammerhead toes Left    HERNIA REPAIR     X 3   INCISION AND DRAINAGE OF WOUND Left 10/24/2020   Procedure: IRRIGATION AND DEBRIDEMENT; BELOW FASCIA FOOT; MULTIPLE;  Surgeon: Samara Deist, DPM;  Location: [place];  Service: Podiatry;  Laterality: Left;  IV with Local   IVC FILTER INSERTION N/A 09/09/2016   Procedure: IVC Filter Insertion;  Surgeon: Katha Cabal, MD;  Location: [place] [facility];  Service:  Cardiovascular;  Laterality: N/A;   IVC FILTER REMOVAL N/A 11/25/2016   Procedure: IVC Filter Removal;  Surgeon: Katha Cabal, MD;  Location: [place] [facility];  Service: Cardiovascular;  Laterality: N/A;   JOINT REPLACEMENT     lyphoma excision  08/2015   arm   NASAL SEPTUM SURGERY  1981   deviated septum, repaired at [place]   ovaries remain   SCAR REVISION N/A 08/02/2020   Procedure: ADVANCEMENT FLAP SCAR REVISION;  Surgeon: Margaretha Sheffield, MD;  Location: [place];  Service: ENT;  Laterality: N/A;   SKIN SURGERY     basel cell   [place]  TOTAL HIP ARTHROPLASTY Right 11/06/2015   Procedure: TOTAL HIP ARTHROPLASTY ANTERIOR APPROACH;  Surgeon: Hessie Knows, MD;  Location: [facility];  Service: Orthopedics;  Laterality: Right;   TOTAL HIP ARTHROPLASTY Left 03/19/2017   Procedure: TOTAL HIP ARTHROPLASTY ANTERIOR APPROACH;  Surgeon: Hessie Knows, MD;  Location: [facility];  Service: Orthopedics;  Laterality: Left;   TOTAL KNEE ARTHROPLASTY Left 08/2013   TOTAL THYROIDECTOMY  09/2011   benign path (done for multinodular goiter concern for cancer)   WEIL OSTEOTOMY Left 10/03/2020   Procedure: WEIL OSTEOTOMY 3RD LEFT;  Surgeon: Samara Deist, DPM;  Location: [place];  Service: Podiatry;  Laterality: Left;    Social History  reports that she quit smoking about 17 years ago. Her smoking use included cigarettes. She smoked an average of .25 packs per day. She has never used smokeless tobacco. She reports current alcohol use. She reports that she does not use drugs.  Allergies  Allergen Reactions   Elemental Sulfur Anaphylaxis   Penicillins Anaphylaxis    Hives and throat swelling   Sulfa Antibiotics Shortness Of Breath   Cefuroxime Axetil Other (See Comments)    Bleeding ulcer   Nutritional Supplements Swelling    Unsure which   Other     All narcotics    Oxycodone-Acetaminophen Nausea And Vomiting   Betadine [Povidone  Iodine] Rash   Latex Rash    Family History  Problem Relation Age of Onset   Diabetes Mother        prediabetes   Alzheimer's disease Mother    Hyperlipidemia Mother    Diabetes Father    Coronary artery disease Father 73       CABG   Alcohol abuse Father    Lung cancer Father    Brain cancer Maternal Uncle    Diabetes Paternal Grandmother    Lung cancer Brother    Alzheimer's disease Brother    Diabetes Maternal Grandmother    Stroke Neg Hx    Colon cancer Neg Hx    Stomach cancer Neg Hx    Kidney disease Neg Hx    Breast cancer Neg Hx    Kidney cancer Neg Hx    Bladder Cancer Neg Hx    Ovarian cancer Neg Hx   Reviewed on admission  Prior to Admission medications   Medication Sig Start Date End Date Taking? Authorizing Provider  cloZAPine (CLOZARIL) 100 MG tablet Take 200 mg by mouth at bedtime. Take 200 mg by mouth along with 75 mg for total 275 mg daily at bedtime 12/25/21  Yes [provider]  cloZAPine (CLOZARIL) 25 MG tablet Take 75 mg by mouth at bedtime. Take 75 mg by mouth along with 200 mg for total 275 mg daily at bedtime 12/25/21  Yes [provider]  Cyanocobalamin (VITAMIN B-12) 1000 MCG SUBL Place 1 tablet (1,000 mcg total) under the tongue daily. 10/09/21  Yes Earlie Server, MD  cyclobenzaprine (FLEXERIL) 10 MG tablet Take 10 mg by mouth 3 (three) times daily as needed for muscle spasms. 09/23/21  Yes [provider]  donepezil (ARICEPT) 10 MG tablet Take 1 tablet (10 mg total) by mouth at bedtime. 08/08/21 12/31/21 Yes Camara, Maryan Puls, MD  estradiol (ESTRACE) 2 MG tablet Take 2 mg by mouth daily. 04/24/17  Yes [provider]  liothyronine (CYTOMEL) 5 MCG tablet Take 5 mcg by mouth every morning. 10/02/21  Yes [provider]  metoprolol tartrate (LOPRESSOR) 25 MG tablet Take 1 tablet by mouth twice daily 10/09/21  Yes Minna Merritts, MD  montelukast (SINGULAIR) 10 MG tablet TAKE 1 TABLET BY MOUTH ONCE DAILY IN THE MORNING  12/31/21  Yes Tally Joe T, FNP  sertraline (ZOLOFT) 50 MG tablet Take 50 mg by mouth daily. 12/24/21  Yes [provider]  solifenacin (VESICARE) 5 MG tablet Take 5 mg by mouth daily. 07/15/19  Yes [provider]  traZODone (DESYREL) 50 MG tablet Take 50 mg by mouth at bedtime. 12/24/21  Yes [provider]  albuterol (VENTOLIN HFA) 108 (90 Base) MCG/ACT inhaler Inhale 2 puffs into the lungs every 6 (six) hours as needed for wheezing or shortness of breath. 01/02/20   Trinna Post, PA-C  benazepril (LOTENSIN) 10 MG tablet Take 1 tablet (10 mg total) by mouth daily. NO FURTHER REFILLS UNTIL SEEN IN OFFICE. Patient not taking: Reported on 12/31/2021 09/27/21   Minna Merritts, MD  cetirizine (ZYRTEC) 10 MG tablet Take 10 mg by mouth daily. Patient not taking: Reported on 12/31/2021    [provider]  citalopram (CELEXA) 40 MG tablet Take 1 tablet by mouth once daily Patient not taking: Reported on 12/31/2021 10/09/21   Myles Gip, DO  cyclobenzaprine (FLEXERIL) 10 MG tablet Take 10 mg by mouth 3 (three) times daily as needed. Patient not taking: Reported on 12/31/2021 10/12/17   [provider]  EPINEPHrine (EPI-PEN) 0.3 mg/0.3 mL DEVI Inject 0.3 mLs (0.3 mg total) into the muscle once. 12/19/10   Ria Bush, MD  QUEtiapine (SEROQUEL) 25 MG tablet Take 1 tablet (25 mg total) by mouth at bedtime. Patient not taking: Reported on 12/31/2021 10/17/21 05/15/22  Alric Ran, MD  SUMAtriptan (IMITREX) 50 MG tablet Take 50 mg by mouth every 2 (two) hours as needed for migraine. May repeat in 2 hours if headache persists or recurs.    [provider]    Physical Exam: Vitals:   01/11/22 1704 01/12/22 2036 01/12/22 2037 01/12/22 2156  BP: (!) 140/95 (!) 81/57 (!) 81/57 99/68  Pulse: 60 69 69   Resp: 16  16   Temp: 97.6 F (36.4 C) 97.7 F (36.5 C) 97.7 F (36.5 C)   TempSrc: Oral     SpO2: 97% 96% 96%   Weight:      Height:         Physical Exam Constitutional:      General: She is not in acute distress.    Appearance: Normal appearance. She is obese.  HENT:     Head: Normocephalic and atraumatic.     Mouth/Throat:     Mouth: Mucous membranes are moist.     Pharynx: Oropharynx is clear.  Eyes:     Extraocular Movements: Extraocular movements intact.     Pupils: Pupils are equal, round, and reactive to light.  Cardiovascular:     Rate and Rhythm: Normal rate and regular rhythm.     Pulses: Normal pulses.     Heart sounds: Normal heart sounds.  Pulmonary:     Effort: Pulmonary effort is normal. No respiratory distress.     Breath sounds: Normal breath sounds.  Abdominal:     General: Bowel sounds are normal. There is no distension.     Palpations: Abdomen is soft.     Tenderness: There is no abdominal tenderness.  Musculoskeletal:        General: No swelling or deformity.  Skin:    General: Skin is warm and dry.  Neurological:     General: No focal deficit present.  Mental Status: Mental status is at baseline.  Psychiatric:     Comments: Patient drowsy but responding appropriately when seen.    Labs on Admission: I have personally reviewed following labs and imaging studies  CBC: Recent Labs  Lab 01/07/22 0625 01/12/22 1953  WBC 3.9* 7.4  NEUTROABS 2.4 5.7  HGB 12.5 13.1  HCT 38.4 42.1  MCV 92.5 95.0  PLT 157 025    Basic Metabolic Panel: Recent Labs  Lab 01/12/22 1953  NA 140  K 4.0  CL 113*  CO2 18*  GLUCOSE 138*  BUN 31*  CREATININE 1.20*  CALCIUM 7.9*    GFR: Estimated Creatinine Clearance: 59.6 mL/min (A) (by C-G formula based on SCr of 1.2 mg/dL (H)).  Liver Function Tests: Recent Labs  Lab 01/12/22 1953  AST 43*  ALT 51*  ALKPHOS 122  BILITOT 1.0  PROT 5.9*  ALBUMIN 3.1*    Urine analysis:    Component Value Date/Time   COLORURINE YELLOW (A) 12/31/2021 1717   APPEARANCEUR HAZY (A) 12/31/2021 1717   APPEARANCEUR Clear 09/02/2016 0908   LABSPEC  1.020 12/31/2021 1717   LABSPEC 1.017 07/05/2014 1112   PHURINE 5.0 12/31/2021 1717   GLUCOSEU NEGATIVE 12/31/2021 1717   GLUCOSEU Negative 07/05/2014 1112   HGBUR NEGATIVE 12/31/2021 1717   BILIRUBINUR NEGATIVE 12/31/2021 1717   BILIRUBINUR Negative 07/08/2021 1617   BILIRUBINUR Negative 09/02/2016 0908   BILIRUBINUR Negative 07/05/2014 1112   KETONESUR NEGATIVE 12/31/2021 1717   PROTEINUR NEGATIVE 12/31/2021 1717   UROBILINOGEN 0.2 07/08/2021 1617   NITRITE NEGATIVE 12/31/2021 1717   LEUKOCYTESUR NEGATIVE 12/31/2021 1717   LEUKOCYTESUR Negative 07/05/2014 1112    Radiological Exams on Admission: No results found.  EKG: Independently reviewed. Sinus rhythm at 65 bpm.  Evidence of left posterior fascicular block.  Assessment/Plan Principal Problem:   Severe hypothyroidism Active Problems:   Lupus anticoagulant disorder (HCC)   Bronchial asthma   Fibromyalgia   Moderate episode of recurrent major depressive disorder (HCC)   GAD (generalized anxiety disorder)   Memory changes   Dementia (HCC)   Severe hypothyroidism > Patient with hypotensive episode today and as part of lab work-up noted to be severely hypothyroid with TSH of 149 and T4 less than 0.25. > Patient received fluid bolus and blood pressure improved from the 42H systolic to the 06C systolic.  She had been intermittently in the 37S systolic in the ED and had intermittently been as high as the 283T systolic. > This transient hypotensive episode may be more severe than initially appears that she has history of hypertension and had been on antihypertensives though none have been prescribed here per chart review. > Patient received a dose of IV Synthroid in the ED and was restarted on her home p.o. Synthroid starting tomorrow.  If recurrent hypotension occurs patient may need additional IV Synthroid so we will monitor in stepdown. > It appears that home dose of Synthroid was not ordered because it did not show up in our  EMR.  On chart review I do see it present in some notes and her outside system endocrine visit, but it does not appear in our EMR med list. > No other evidence of myxedema coma.  Is presenting with worsening of her dementia/psychotic symptoms which could have some relation to thyroid change though unclear if she is taking medication prior to arrival.  Currently no hyponatremia, hypothermia, hypoventilation or hypoglycemia. - Monitor on stepdown unit for now - Received Synthroid IV as above - We  will resume 175 mcg daily tomorrow - Monitor for recurrent hypotension, given AKI we will give a second liter for now - Trend CMP, temperature, monitor on telemetry - Hold home metoprolol  Hypotension > Unclear etiology.  Likely component of mild dehydration and possibly related to her hypothyroidism as above. > Received a liter of fluids in the ED and IV dose of Synthroid. > As above as there is no other signs or symptoms of myxedema we will hold off on further IV Synthroid for now and plan to start p.o. tomorrow unless further hypotension develops. - Give additional liter of IV fluids. - Continue to hold antihypertensives - Address hypothyroidism as above  AKI on CKD 2 > Creatinine elevated to 1.2 from baseline around 0.9. > This appears to have worsened while she is been here possibly mild dehydration.  Has not been receiving home Lasix so this is not a contributing factor. > Received a liter of fluids in the ED and will give additional liter over the next 2 hours as well. - Monitor response to fluids - Trend renal function electrolytes  Asthma - Replace home Advair with formulary Breo - Continue home Singulair  Dementia Psychosis Depression Anxiety > Presenting to the ED with worsening dementia with psychosis stating she was presenting to have her arms and legs cut off. > Seen by inpatient psych and deemed not a candidate for inpatient psych had been boarding for placement. - Continue to  work on placement - Continue with clozapine, donepezil, sertraline, trazodone - Correct hypothyroidism as above  Eosinophilic esophagitis - Continue home PPI  Obesity History of gastric bypass - Noted  DVT prophylaxis: Lovenox Code Status:   Full Family Communication:  Attempted to contact family by phone but there is no answer at the phone of either daughter nor patient spouse.  Left a message with patient's daughter, Charline Bills.   Disposition Plan:   Patient is from:  Home  Anticipated DC to:  Pending placement  Anticipated DC date:  Pending clinical course and placement  Anticipated DC barriers: Awaiting placement for patient with dementia and psychotic features  Consults called:  None.  Had previously been seen by psychiatry. Admission status:  Observation, stepdown  Severity of Illness: The appropriate patient status for this patient is OBSERVATION. Observation status is judged to be reasonable and necessary in order to provide the required intensity of service to ensure the patient's safety. The patient's presenting symptoms, physical exam findings, and initial radiographic and laboratory data in the context of their medical condition is felt to place them at decreased risk for further clinical deterioration. Furthermore, it is anticipated that the patient will be medically stable for discharge from the hospital within 2 midnights of admission.    Marcelyn Bruins MD [facility]  How to contact the [facility] Attending or Consulting provider [place] or covering provider during after hours [place], for this patient?   Check the care team in [facility] and look for a) attending/consulting [facility] provider listed and b) the [facility] team listed Log into [email or web address] and use [place]'s universal password to access. If you do not have the password, please contact the hospital operator. Locate the [facility] provider you are looking for under [facility] and page to a number that you can  be directly reached. If you still have difficulty reaching the provider, please page the [facility] (Director on Call) for the Hospitalists listed on amion for assistance.  01/12/2022, 11:21 PM

## 2022-01-13 ENCOUNTER — Inpatient Hospital Stay: Payer: Medicare Other

## 2022-01-13 DIAGNOSIS — Z880 Allergy status to penicillin: Secondary | ICD-10-CM | POA: Diagnosis not present

## 2022-01-13 DIAGNOSIS — D6862 Lupus anticoagulant syndrome: Secondary | ICD-10-CM | POA: Diagnosis present

## 2022-01-13 DIAGNOSIS — I739 Peripheral vascular disease, unspecified: Secondary | ICD-10-CM | POA: Diagnosis present

## 2022-01-13 DIAGNOSIS — E039 Hypothyroidism, unspecified: Secondary | ICD-10-CM | POA: Diagnosis not present

## 2022-01-13 DIAGNOSIS — Z20822 Contact with and (suspected) exposure to covid-19: Secondary | ICD-10-CM | POA: Diagnosis present

## 2022-01-13 DIAGNOSIS — R1310 Dysphagia, unspecified: Secondary | ICD-10-CM | POA: Diagnosis present

## 2022-01-13 DIAGNOSIS — K200 Eosinophilic esophagitis: Secondary | ICD-10-CM | POA: Diagnosis present

## 2022-01-13 DIAGNOSIS — Z9884 Bariatric surgery status: Secondary | ICD-10-CM | POA: Diagnosis not present

## 2022-01-13 DIAGNOSIS — N3281 Overactive bladder: Secondary | ICD-10-CM | POA: Diagnosis present

## 2022-01-13 DIAGNOSIS — E860 Dehydration: Secondary | ICD-10-CM | POA: Diagnosis present

## 2022-01-13 DIAGNOSIS — F05 Delirium due to known physiological condition: Secondary | ICD-10-CM | POA: Diagnosis present

## 2022-01-13 DIAGNOSIS — J984 Other disorders of lung: Secondary | ICD-10-CM | POA: Diagnosis not present

## 2022-01-13 DIAGNOSIS — F0394 Unspecified dementia, unspecified severity, with anxiety: Secondary | ICD-10-CM | POA: Diagnosis present

## 2022-01-13 DIAGNOSIS — F489 Nonpsychotic mental disorder, unspecified: Secondary | ICD-10-CM | POA: Diagnosis present

## 2022-01-13 DIAGNOSIS — J45909 Unspecified asthma, uncomplicated: Secondary | ICD-10-CM | POA: Diagnosis present

## 2022-01-13 DIAGNOSIS — Z882 Allergy status to sulfonamides status: Secondary | ICD-10-CM | POA: Diagnosis not present

## 2022-01-13 DIAGNOSIS — I959 Hypotension, unspecified: Secondary | ICD-10-CM | POA: Diagnosis present

## 2022-01-13 DIAGNOSIS — E890 Postprocedural hypothyroidism: Secondary | ICD-10-CM | POA: Diagnosis present

## 2022-01-13 DIAGNOSIS — I129 Hypertensive chronic kidney disease with stage 1 through stage 4 chronic kidney disease, or unspecified chronic kidney disease: Secondary | ICD-10-CM | POA: Diagnosis present

## 2022-01-13 DIAGNOSIS — M797 Fibromyalgia: Secondary | ICD-10-CM | POA: Diagnosis present

## 2022-01-13 DIAGNOSIS — N179 Acute kidney failure, unspecified: Secondary | ICD-10-CM | POA: Diagnosis not present

## 2022-01-13 DIAGNOSIS — N182 Chronic kidney disease, stage 2 (mild): Secondary | ICD-10-CM | POA: Diagnosis not present

## 2022-01-13 DIAGNOSIS — E669 Obesity, unspecified: Secondary | ICD-10-CM | POA: Diagnosis present

## 2022-01-13 DIAGNOSIS — Z86718 Personal history of other venous thrombosis and embolism: Secondary | ICD-10-CM | POA: Diagnosis not present

## 2022-01-13 DIAGNOSIS — F0393 Unspecified dementia, unspecified severity, with mood disturbance: Secondary | ICD-10-CM | POA: Diagnosis present

## 2022-01-13 DIAGNOSIS — F03918 Unspecified dementia, unspecified severity, with other behavioral disturbance: Secondary | ICD-10-CM | POA: Diagnosis not present

## 2022-01-13 DIAGNOSIS — F0392 Unspecified dementia, unspecified severity, with psychotic disturbance: Secondary | ICD-10-CM | POA: Diagnosis present

## 2022-01-13 DIAGNOSIS — Z888 Allergy status to other drugs, medicaments and biological substances status: Secondary | ICD-10-CM | POA: Diagnosis not present

## 2022-01-13 LAB — URINALYSIS, COMPLETE (UACMP) WITH MICROSCOPIC
Bilirubin Urine: NEGATIVE
Glucose, UA: NEGATIVE mg/dL
Hgb urine dipstick: NEGATIVE
Ketones, ur: NEGATIVE mg/dL
Leukocytes,Ua: NEGATIVE
Nitrite: NEGATIVE
Protein, ur: NEGATIVE mg/dL
Specific Gravity, Urine: 1.026 (ref 1.005–1.030)
pH: 5 (ref 5.0–8.0)

## 2022-01-13 LAB — COMPREHENSIVE METABOLIC PANEL
ALT: 44 U/L (ref 0–44)
AST: 40 U/L (ref 15–41)
Albumin: 2.6 g/dL — ABNORMAL LOW (ref 3.5–5.0)
Alkaline Phosphatase: 96 U/L (ref 38–126)
Anion gap: 6 (ref 5–15)
BUN: 26 mg/dL — ABNORMAL HIGH (ref 6–20)
CO2: 22 mmol/L (ref 22–32)
Calcium: 7.3 mg/dL — ABNORMAL LOW (ref 8.9–10.3)
Chloride: 113 mmol/L — ABNORMAL HIGH (ref 98–111)
Creatinine, Ser: 1 mg/dL (ref 0.44–1.00)
GFR, Estimated: >60 mL/min (ref >60)
Glucose, Bld: 91 mg/dL (ref 70–99)
Potassium: 4.1 mmol/L (ref 3.5–5.1)
Sodium: 141 mmol/L (ref 135–145)
Total Bilirubin: 0.8 mg/dL (ref 0.3–1.2)
Total Protein: 4.9 g/dL — ABNORMAL LOW (ref 6.5–8.1)

## 2022-01-13 LAB — CBC
HCT: 36.7 % (ref 36.0–46.0)
Hemoglobin: 11.5 g/dL — ABNORMAL LOW (ref 12.0–15.0)
MCH: 29.8 pg (ref 26.0–34.0)
MCHC: 31.3 g/dL (ref 30.0–36.0)
MCV: 95.1 fL (ref 80.0–100.0)
Platelets: 164 10*3/uL (ref 150–400)
RBC: 3.86 MIL/uL — ABNORMAL LOW (ref 3.87–5.11)
RDW: 14.6 % (ref 11.5–15.5)
WBC: 5 10*3/uL (ref 4.0–10.5)
nRBC: 0 % (ref 0.0–0.2)

## 2022-01-13 LAB — HIV ANTIBODY (ROUTINE TESTING W REFLEX): HIV Screen 4th Generation wRfx: NONREACTIVE

## 2022-01-13 LAB — GLUCOSE, CAPILLARY: Glucose-Capillary: 99 mg/dL (ref 70–99)

## 2022-01-13 MED ORDER — ORAL CARE MOUTH RINSE: 15.0000 mL | OROMUCOSAL | Status: DC | PRN

## 2022-01-13 MED ORDER — SODIUM CHLORIDE 0.9 % IV BOLUS
500.0000 mL | Freq: Once | INTRAVENOUS | Status: AC
  Administered 2022-01-13: 500 mL via INTRAVENOUS

## 2022-01-13 NOTE — Progress Notes (Signed)
       CROSS COVER NOTE  NAME: Claire Strong MRN: [id number] DOB : 06-05-1961    Date of Service   01/13/22  HPI/Events of Note   AM Follow-up on patient signed out by swing admitter. BP 84/57 MAP 67.   [name] is a 61 year old female who presented to [facility] ED on  12/31/2021 has been holding in the ED awaiting placement.  Tonight noted to be hypotensive with hypothyroidism. Received 1 L fluid bolus and IV Synthroid in ED, and hospitalist team consulted for admission.    Interventions   Plan: 500 mL NS bolus Monitor for HF clinical symptoms and unmasking of CHF      This document was prepared using Dragon voice recognition software and may include unintentional dictation errors.  Neomia Glass DNP, MHA, FNP-BC Nurse Practitioner [facility] Pager [phone]

## 2022-01-13 NOTE — Assessment & Plan Note (Signed)
Etiology likely multifactorial due to severe hypothyroidism / overmedication with maintenance antihypertensives / dehydration  S/p IVF resuscitation and holding meds, BP improved

## 2022-01-13 NOTE — Hospital Course (Addendum)
Claire Strong is a 61 y.o. female with medical history significant of lupus anticoagulant, asthma, fibromyalgia, depression, anxiety, dementia, hypertension, hypothyroidism, anemia, history of DVT, peripheral arterial disease, CKD 2, dysphagia, eosinophilic esophagitis, diverticulosis, obesity.  Initially presented to ED 12/31/2021, brought by daughter with chief complaint of mental status decline due to dementia.  Had previously been hospitalized in [facility], diagnosed with dementia 10/03/2021, noted psychosis while in the hospital in May 2023.  Patient's daughter stated that since discharge she had been more confused/agitated.  Patient was delusional with auditory hallucinations, stating that she was brought to the hospital to "get the embalming fluid out of me and have my arms and legs cut off".  Initial ED work-up no acute medical processes, patient was kept for psychiatric observation, psychiatry cleared her no need for full IVC, social work was consulted for placement in memory care.  ED reordered home medications.   07/30: Patient had been holding in the ED, until yesterday 01/12/2022 noted to be hypotensive with systolic in the 79U, asymptomatic, labs repeated.  TSH 149, free T4 undetectable, mild AKI with creatinine 1.2, urinalysis pending.  Received IV fluids and IV Synthroid, apparently patient had not been getting her Synthroid here as was not in EMR but was located in outside records from her endocrinologist office.  Was admitted to medicine service for hypotension concern for hypothyroidism / overmedication with maintenance antihypertensives / dehydration.  07/31: stable, BP improving. Placement has been arranged and awaiting CXR to eval for TB.  Facility cannot take her until 01/15/2022.  Blood pressure improving.  Will monitor until that time

## 2022-01-13 NOTE — Progress Notes (Signed)
PROGRESS NOTE    FEMALE IAFRATE  ZOX:[id number] DOB: 10/31/1960  DOA: 12/31/2021 Date of Service: 01/13/22 PCP: Virginia Crews, MD     Brief Narrative / Hospital Course:  ADYSSON REVELLE is a 61 y.o. female with medical history significant of lupus anticoagulant, asthma, fibromyalgia, depression, anxiety, dementia, hypertension, hypothyroidism, anemia, history of DVT, peripheral arterial disease, CKD 2, dysphagia, eosinophilic esophagitis, diverticulosis, obesity.  Initially presented to ED 12/31/2021, brought by daughter with chief complaint of mental status decline due to dementia.  Had previously been hospitalized in [facility], diagnosed with dementia 10/03/2021, noted psychosis while in the hospital in May 2023.  Patient's daughter stated that since discharge she had been more confused/agitated.  Patient was delusional with auditory hallucinations, stating that she was brought to the hospital to "get the embalming fluid out of me and have my arms and legs cut off".  Initial ED work-up no acute medical processes, patient was kept for psychiatric observation, psychiatry cleared her no need for full IVC, social work was consulted for placement in memory care.  ED reordered home medications.   07/30: Patient had been holding in the ED, until yesterday 01/12/2022 noted to be hypotensive with systolic in the 81X, asymptomatic, labs repeated.  TSH 149, free T4 undetectable, mild AKI with creatinine 1.2, urinalysis pending.  Received IV fluids and IV Synthroid, apparently patient had not been getting her Synthroid here as was not in EMR but was located in outside records from her endocrinologist office.  Was admitted to medicine service for hypotension concern for hypothyroidism / overmedication with maintenance antihypertensives / dehydration.  07/31: stable, BP improving. Placement has been arranged and awaiting CXR to eval for TB.  Facility cannot take her until 01/15/2022.  Blood  pressure improving.  Will monitor until that time   Consultants:  None (previously seen by psychiatry on initial ED visit, would have low threshold for reconsultation versus neurology)  Procedures: none    Subjective: Patient reports no complaints this morning, when asked why she is here she is clear that she has been here for some time, she thinks a couple weeks, she knows the year, she knows where she is.  She states very calmly "I came in to get rid of all this extra skin" but cannot tell me why.  She denies pain.  She states "my blood pressure runs low a lot."  She denies dizziness, headache, vision changes, shortness of breath, chest pain.  He reports eating and drinking normally.  States she knows that she was on thyroid medication at some point but cannot tell me the name or dosage.     ASSESSMENT & PLAN:   Principal Problem:   Severe hypothyroidism Active Problems:   HTN (hypertension)   Lupus anticoagulant disorder (HCC)   Bronchial asthma   Fibromyalgia   PAD (peripheral artery disease) (HCC)   History of gastric bypass   Eosinophilic esophagitis   Obesity   Moderate episode of recurrent major depressive disorder (HCC)   GAD (generalized anxiety disorder)   Memory changes   Dementia (HCC)   Acute renal failure superimposed on stage 2 chronic kidney disease (HCC)   Hypotension   Neuropsychiatric disorder - dementia complicated by MDD, GAD   Overactive bladder   Severe hypothyroidism Patient received a dose of IV Synthroid in the ED  restarted on her home p.o. Synthroid 175 mcg daily starting today 07/31 Trend TSH Monitor VS closely  Hypotension Etiology likely multifactorial due to severe hypothyroidism /  overmedication with maintenance antihypertensives / dehydration S/p IVF resuscitation and BP improved Monitor VS closely Holding home antihypertensives Addressing hypothyroidism as above  Acute renal failure superimposed on stage 2 chronic kidney disease  ([place]) Lab Results  Component Value Date   CREATININE 1.00 01/13/2022   CREATININE 1.20 (H) 01/12/2022   CREATININE 0.88 12/31/2021  Resolved with IV fluids Trend serial BMP  HTN (hypertension) Holding home antihypertensives due to hypotension  Neuropsychiatric disorder - dementia complicated by MDD, GAD Status post psychiatric evaluation Low threshold for reconsultation vs neurology involvement - appears stable for now  Continue clozapine 275 mg nightly, donepezil 10 mg daily, sertraline 50 mg daily, trazodone 50 mg nightly,  Eosinophilic esophagitis Continue home PPI  Overactive bladder Continue Darifenacin   DVT prophylaxis: lovenox Code Status: FULL Family Communication: Admitting hospitalist attempted to reach daughter last night, left voicemail.  We will try again today. Disposition Plan / TOC needs: Awaiting placement for dementia with psychotic features, TOC following Barriers to discharge / significant pending items: Hypotension, placement             Objective: Vitals:   01/13/22 0540 01/13/22 0600 01/13/22 1043 01/13/22 1507  BP:  105/70 107/71 130/86  Pulse:  67 73 77  Resp:  17 17 20   Temp: 98 F (36.7 C)  98.6 F (37 C)   TempSrc: Oral  Oral   SpO2:  93% 95% 98%  Weight:      Height:       No intake or output data in the 24 hours ending 01/13/22 1554 Filed Weights   12/31/21 1639  Weight: 93.4 kg    Examination:  Constitutional:  VS as above General Appearance: alert, well-developed, well-nourished, NAD Eyes: Normal lids and conjunctive, non-icteric sclera Ears, Nose, Mouth, Throat: Normal appearance Neck: No masses, trachea midline Respiratory: Normal respiratory effort Breath sounds normal, no wheeze/rhonchi/rales Cardiovascular: S1/S2 normal, no murmur/rub/gallop auscultated No lower extremity edema Gastrointestinal: Nontender, no masses Musculoskeletal:  No clubbing/cyanosis of digits Neurological: No cranial nerve  deficit on limited exam Psychiatric: Poor judgment/insight Appears to have normal memory long-term  Normal mood and affect       Scheduled Medications:   cloZAPine  200 mg Oral QHS   cloZAPine  75 mg Oral QHS   darifenacin  7.5 mg Oral Daily   donepezil  10 mg Oral QHS   enoxaparin (LOVENOX) injection  40 mg Subcutaneous Q24H   estradiol  2 mg Oral Daily   fluticasone furoate-vilanterol  1 puff Inhalation Daily   levothyroxine  175 mcg Oral Q0600   liothyronine  5 mcg Oral q morning   montelukast  10 mg Oral q morning   pantoprazole  40 mg Oral Daily   sertraline  50 mg Oral Daily   sodium chloride flush  3 mL Intravenous Q12H   traZODone  50 mg Oral QHS   cyanocobalamin  1,000 mcg Oral Daily    Continuous Infusions:   PRN Medications:  acetaminophen **OR** acetaminophen, albuterol, polyethylene glycol  Antimicrobials:  Anti-infectives (From admission, onward)    None       Data Reviewed: I have personally reviewed following labs and imaging studies  CBC: Recent Labs  Lab 01/07/22 0625 01/12/22 1953 01/13/22 0522  WBC 3.9* 7.4 5.0  NEUTROABS 2.4 5.7  --   HGB 12.5 13.1 11.5*  HCT 38.4 42.1 36.7  MCV 92.5 95.0 95.1  PLT 157 183 419   Basic Metabolic Panel: Recent Labs  Lab 01/12/22 1953 01/13/22  0522  NA 140 141  K 4.0 4.1  CL 113* 113*  CO2 18* 22  GLUCOSE 138* 91  BUN 31* 26*  CREATININE 1.20* 1.00  CALCIUM 7.9* 7.3*   GFR: Estimated Creatinine Clearance: 71.5 mL/min (by C-G formula based on SCr of 1 mg/dL). Liver Function Tests: Recent Labs  Lab 01/12/22 1953 01/13/22 0522  AST 43* 40  ALT 51* 44  ALKPHOS 122 96  BILITOT 1.0 0.8  PROT 5.9* 4.9*  ALBUMIN 3.1* 2.6*   No results for input(s): "LIPASE", "AMYLASE" in the last 168 hours. No results for input(s): "AMMONIA" in the last 168 hours. Coagulation Profile: No results for input(s): "INR", "PROTIME" in the last 168 hours. Cardiac Enzymes: No results for input(s): "CKTOTAL",  "CKMB", "CKMBINDEX", "TROPONINI" in the last 168 hours. BNP (last 3 results) No results for input(s): "PROBNP" in the last 8760 hours. HbA1C: No results for input(s): "HGBA1C" in the last 72 hours. CBG: No results for input(s): "GLUCAP" in the last 168 hours. Lipid Profile: No results for input(s): "CHOL", "HDL", "LDLCALC", "TRIG", "CHOLHDL", "LDLDIRECT" in the last 72 hours. Thyroid Function Tests: Recent Labs    01/12/22 1953  TSH 149.738*  FREET4 <0.25*   Anemia Panel: No results for input(s): "VITAMINB12", "FOLATE", "FERRITIN", "TIBC", "IRON", "RETICCTPCT" in the last 72 hours. Urine analysis:    Component Value Date/Time   COLORURINE YELLOW (A) 12/31/2021 1717   APPEARANCEUR HAZY (A) 12/31/2021 1717   APPEARANCEUR Clear 09/02/2016 0908   LABSPEC 1.020 12/31/2021 1717   LABSPEC 1.017 07/05/2014 1112   PHURINE 5.0 12/31/2021 1717   GLUCOSEU NEGATIVE 12/31/2021 1717   GLUCOSEU Negative 07/05/2014 1112   HGBUR NEGATIVE 12/31/2021 1717   BILIRUBINUR NEGATIVE 12/31/2021 1717   BILIRUBINUR Negative 07/08/2021 1617   BILIRUBINUR Negative 09/02/2016 0908   BILIRUBINUR Negative 07/05/2014 1112   KETONESUR NEGATIVE 12/31/2021 1717   PROTEINUR NEGATIVE 12/31/2021 1717   UROBILINOGEN 0.2 07/08/2021 1617   NITRITE NEGATIVE 12/31/2021 1717   LEUKOCYTESUR NEGATIVE 12/31/2021 1717   LEUKOCYTESUR Negative 07/05/2014 1112   Sepsis Labs: @LABRCNTIP (procalcitonin:4,lacticidven:4)  No results found for this or any previous visit (from the past 240 hour(s)).       Radiology Studies last 96 hours: DG Chest Port 1 View  Result Date: 01/13/2022 CLINICAL DATA:  Screening for tuberculosis. EXAM: PORTABLE CHEST 1 VIEW COMPARISON:  01/02/2020 FINDINGS: Heart size is normal. Mediastinal shadows are normal. There is linear scarring in the right mid lung and at both lung bases. No evidence of active consolidation, lobar collapse or effusion. No finding specific for granulomatous infection.  IMPRESSION: No sign of granulomatous infection. Linear scarring in the right mid lung and at both lung bases. Electronically Signed   By: Nelson Chimes M.D.   On: 01/13/2022 14:27            LOS: 0 days       Emeterio Reeve, DO [facility] 01/13/2022, 3:54 PM   Staff may message me via secure chat in [place]  but this may not receive immediate response,  please page for urgent matters!  If 7PM-7AM, please contact night-coverage [email or web address]  Dictation software was used to generate the above note. Typos may occur and escape review, as with typed/written notes. Please contact Dr Sheppard Coil directly for clarity if needed.

## 2022-01-13 NOTE — TOC Progression Note (Signed)
Transition of Care [facility]) - Progression Note    Patient Details  Name: Claire Strong MRN: [id number] Date of Birth: February 03, 1961  [facility] CM/SW Contact  Shelbie Hutching, RN Phone Number: 01/13/2022, 9:12 AM  Clinical Narrative:    Received a message from patient's daughter [name], yesterday, they have been working relentlessly for the past 2 weeks trying to find placement.  [name] reports that patient has been denied by 2 facilities due to her age and or the Clozoril she is taking.  Danielle with [employer] is working with the family trying to find placement and [name] thinks that there is a facility coming to assess today from [place].  TOC will reach out to [facility] for updates from [facility].    Yesterday the patient was found to be hypotensive, she is being admitted for hypothyroidism.  Daughter updated.  TOC will cont to follow.    Expected Discharge Plan: Memory Care Barriers to Discharge: Other (must enter comment) (looking for memory care/ ALF)  Expected Discharge Plan and Services Expected Discharge Plan: Memory Care   Discharge Planning Services: CM Consult   Living arrangements for the past 2 months: Single Family Home                 DME Arranged: N/A DME Agency: NA       HH Arranged: NA HH Agency: NA         Social Determinants of Health (SDOH) Interventions    Readmission Risk Interventions     No data to display

## 2022-01-13 NOTE — ED Notes (Signed)
Assumed care of pt

## 2022-01-13 NOTE — Assessment & Plan Note (Signed)
-   Continue home PPI °

## 2022-01-13 NOTE — NC FL2 (Signed)
[place] LEVEL OF CARE SCREENING TOOL     IDENTIFICATION  Patient Name: Claire Strong Birthdate: [birth year] Sex: female Admission Date (Current Location): 12/31/2021  [place] and [place] Number:  Engineering geologist and Address:  [facility], [address] [place], [place] [address]      Provider Number: [id number]  Attending Physician Name and Address:  Emeterio Reeve, DO  Relative Name and Phone Number:  Ilda Foil- daughter- [phone]    Current Level of Care: Other (Comment) (ED boarder) Recommended Level of Care: [place], Memory Care Prior Approval Number:    Date Approved/Denied:   PASRR Number:    Discharge Plan: Domiciliary (Rest home) (ALF/Memory Care)    Current Diagnoses: Patient Active Problem List   Diagnosis Date Noted   Hypotension 01/13/2022   Neuropsychiatric disorder - dementia complicated by MDD, GAD 46/27/0350   Overactive bladder 01/13/2022   Severe hypothyroidism 01/12/2022   Acute renal failure superimposed on stage 2 chronic kidney disease ([place]) 01/12/2022   Dementia ([place]) 12/31/2021   Memory changes 09/38/1829   Metabolic syndrome 93/71/6967   Moderate episode of recurrent major depressive disorder ([place]) 07/22/2019   GAD (generalized anxiety disorder) 07/22/2019   Obesity 89/38/1017   Eosinophilic esophagitis    Benign neoplasm of ascending colon    Benign neoplasm of cecum    Diverticulosis of large intestine without diverticulitis    History of gastric bypass 06/01/2018   Esophageal dysphagia 05/06/2018   Secondary hyperparathyroidism, non-renal ([place]) 05/02/2017   CKD (chronic kidney disease) stage 2, GFR 60-89 ml/min 05/02/2017   Primary localized osteoarthritis of left hip 03/19/2017   History of DVT (deep vein thrombosis) 08/25/2016   PAD (peripheral artery disease) ([place]) 08/25/2016   Chronic venous insufficiency 08/25/2016   Iron deficiency anemia 03/25/2016    Primary osteoarthritis of right hip 11/06/2015   History of kidney stones 09/03/2015   Osteoarthritis    Fibromyalgia 02/14/2014   Atrial tachycardia ([place]) 01/25/2013   Edema 01/28/2012   Headache 09/25/2011   Bronchial asthma    Hot flashes 03/18/2011   Nasal septal perforation    Horseshoe kidney    HTN (hypertension)    Lupus anticoagulant disorder (HCC)     Orientation RESPIRATION BLADDER Height & Weight     Self  Normal Continent Weight: 93.4 kg Height:  5\' 8"  (172.7 cm)  BEHAVIORAL SYMPTOMS/MOOD NEUROLOGICAL BOWEL NUTRITION STATUS     Continent Diet (Regular)  AMBULATORY STATUS COMMUNICATION OF NEEDS Skin   Independent Verbally Normal                       Personal Care Assistance Level of Assistance  Bathing, Feeding, Dressing Bathing Assistance: Limited assistance Feeding assistance: Independent Dressing Assistance: Limited assistance     Functional Limitations Info  Sight, Hearing, Speech Sight Info: Adequate Hearing Info: Adequate Speech Info: Adequate    SPECIAL CARE FACTORS FREQUENCY                       Contractures Contractures Info: Not present    Additional Factors Info  Code Status, Allergies Code Status Info: Full Allergies Info: Elemental Sulfer, PCN, Sulfa antibiotics, cefuoxime, nutritional supplements, all narcotics, oxycodone, betadine, latex           Current Medications (01/13/2022):  This is the current hospital active medication list Current Facility-Administered Medications  Medication Dose Route Frequency Provider Last Rate Last Admin   acetaminophen (TYLENOL)  tablet 650 mg  650 mg Oral Q6H PRN Marcelyn Bruins, MD       Or   acetaminophen (TYLENOL) suppository 650 mg  650 mg Rectal Q6H PRN Marcelyn Bruins, MD       albuterol (VENTOLIN HFA) 108 (90 Base) MCG/ACT inhaler 2 puff  2 puff Inhalation Q6H PRN Marcelyn Bruins, MD       cloZAPine (CLOZARIL) tablet 200 mg  200 mg Oral QHS Marcelyn Bruins,  MD   200 mg at 01/12/22 2110   cloZAPine (CLOZARIL) tablet 75 mg  75 mg Oral QHS Marcelyn Bruins, MD   75 mg at 01/12/22 2110   darifenacin (ENABLEX) 24 hr tablet 7.5 mg  7.5 mg Oral Daily Marcelyn Bruins, MD   7.5 mg at 01/13/22 1037   donepezil (ARICEPT) tablet 10 mg  10 mg Oral QHS Marcelyn Bruins, MD   10 mg at 01/12/22 2110   enoxaparin (LOVENOX) injection 40 mg  40 mg Subcutaneous Q24H Marcelyn Bruins, MD   40 mg at 01/12/22 2112   estradiol (ESTRACE) tablet 2 mg  2 mg Oral Daily Marcelyn Bruins, MD   2 mg at 01/13/22 1036   fluticasone furoate-vilanterol (BREO ELLIPTA) 100-25 MCG/ACT 1 puff  1 puff Inhalation Daily Marcelyn Bruins, MD   1 puff at 01/13/22 1038   levothyroxine (SYNTHROID) tablet 175 mcg  175 mcg Oral Q0600 Marcelyn Bruins, MD   175 mcg at 01/13/22 0533   liothyronine (CYTOMEL) tablet 5 mcg  5 mcg Oral q morning Marcelyn Bruins, MD   5 mcg at 01/13/22 1035   montelukast (SINGULAIR) tablet 10 mg  10 mg Oral q morning Marcelyn Bruins, MD   10 mg at 01/13/22 1034   pantoprazole (PROTONIX) EC tablet 40 mg  40 mg Oral Daily Marcelyn Bruins, MD   40 mg at 01/13/22 1035   polyethylene glycol (MIRALAX / GLYCOLAX) packet 17 g  17 g Oral Daily PRN Marcelyn Bruins, MD   17 g at 01/03/22 1326   sertraline (ZOLOFT) tablet 50 mg  50 mg Oral Daily Marcelyn Bruins, MD   50 mg at 01/13/22 1035   sodium chloride flush (NS) 0.9 % injection 3 mL  3 mL Intravenous Q12H Marcelyn Bruins, MD   3 mL at 01/13/22 1039   traZODone (DESYREL) tablet 50 mg  50 mg Oral QHS Marcelyn Bruins, MD   50 mg at 01/12/22 2110   vitamin B-12 (CYANOCOBALAMIN) tablet 1,000 mcg  1,000 mcg Oral Daily Marcelyn Bruins, MD   1,000 mcg at 01/13/22 1036   Current Outpatient Medications  Medication Sig Dispense Refill   cloZAPine (CLOZARIL) 100 MG tablet Take 200 mg by mouth at bedtime. Take 200 mg by mouth along with 75 mg for total 275 mg daily at bedtime      cloZAPine (CLOZARIL) 25 MG tablet Take 75 mg by mouth at bedtime. Take 75 mg by mouth along with 200 mg for total 275 mg daily at bedtime     Cyanocobalamin (VITAMIN B-12) 1000 MCG SUBL Place 1 tablet (1,000 mcg total) under the tongue daily. 90 tablet 1   cyclobenzaprine (FLEXERIL) 10 MG tablet Take 10 mg by mouth 3 (three) times daily as needed for muscle spasms.     donepezil (ARICEPT) 10 MG tablet Take 1 tablet (10 mg total) by mouth at bedtime. 90 tablet 4   estradiol (ESTRACE) 2 MG tablet Take  2 mg by mouth daily.  0   liothyronine (CYTOMEL) 5 MCG tablet Take 5 mcg by mouth every morning.     metoprolol tartrate (LOPRESSOR) 25 MG tablet Take 1 tablet by mouth twice daily 180 tablet 0   montelukast (SINGULAIR) 10 MG tablet TAKE 1 TABLET BY MOUTH ONCE DAILY IN THE MORNING 90 tablet 0   sertraline (ZOLOFT) 50 MG tablet Take 50 mg by mouth daily.     solifenacin (VESICARE) 5 MG tablet Take 5 mg by mouth daily.     traZODone (DESYREL) 50 MG tablet Take 50 mg by mouth at bedtime.     albuterol (VENTOLIN HFA) 108 (90 Base) MCG/ACT inhaler Inhale 2 puffs into the lungs every 6 (six) hours as needed for wheezing or shortness of breath. 6.7 g 0   benazepril (LOTENSIN) 10 MG tablet Take 1 tablet (10 mg total) by mouth daily. NO FURTHER REFILLS UNTIL SEEN IN OFFICE. (Patient not taking: Reported on 12/31/2021) 15 tablet 0   cetirizine (ZYRTEC) 10 MG tablet Take 10 mg by mouth daily. (Patient not taking: Reported on 12/31/2021)     citalopram (CELEXA) 40 MG tablet Take 1 tablet by mouth once daily (Patient not taking: Reported on 12/31/2021) 90 tablet 0   cyclobenzaprine (FLEXERIL) 10 MG tablet Take 10 mg by mouth 3 (three) times daily as needed. (Patient not taking: Reported on 12/31/2021)     EPINEPHrine (EPI-PEN) 0.3 mg/0.3 mL DEVI Inject 0.3 mLs (0.3 mg total) into the muscle once. 1 Device 2   QUEtiapine (SEROQUEL) 25 MG tablet Take 1 tablet (25 mg total) by mouth at bedtime. (Patient not taking: Reported  on 12/31/2021) 30 tablet 6   SUMAtriptan (IMITREX) 50 MG tablet Take 50 mg by mouth every 2 (two) hours as needed for migraine. May repeat in 2 hours if headache persists or recurs.       Discharge Medications: Please see discharge summary for a list of discharge medications.  Relevant Imaging Results:  Relevant Lab Results:   Additional Information SS# [id number]  Shelbie Hutching, RN

## 2022-01-13 NOTE — ED Notes (Signed)
Pt given meal tray, 100% consumed.

## 2022-01-13 NOTE — Assessment & Plan Note (Addendum)
   Secondary to hypotension initially.  Creatinine has since improved although starting to trend back up slightly by time of discharge.  Recommendation is for patient to have a basic metabolic panel checked in 1 week.

## 2022-01-13 NOTE — Progress Notes (Signed)
Admission profile updated. ?

## 2022-01-13 NOTE — ED Notes (Signed)
Patient incontinent of urine. When patient asked to tell RN when she needs to urinate she states "I cannot hold it." Patient cleaned and new brief and pads placed. Purwick placed on patient

## 2022-01-13 NOTE — Assessment & Plan Note (Addendum)
   Continue Darifenacin

## 2022-01-13 NOTE — Assessment & Plan Note (Addendum)
Status post psychiatric evaluation Low threshold for reconsultation vs neurology involvement - appears stable for now   Continue clozapine 275 mg nightly, donepezil 10 mg daily, sertraline 50 mg daily, trazodone 50 mg nightly,

## 2022-01-13 NOTE — Progress Notes (Addendum)
   01/13/22 1215  Clinical Encounter Type  Visited With Patient  Visit Type Follow-up;Spiritual support;Social support   Greeted pt for a brief f/u to support her as she remains in ED hall bed. Pt calm and spoke with greater presence of mind. Visit welcomed.

## 2022-01-13 NOTE — Assessment & Plan Note (Signed)
   Holding home antihypertensives due to hypotension

## 2022-01-13 NOTE — TOC Progression Note (Signed)
Transition of Care [facility]) - Progression Note    Patient Details  Name: Claire Strong MRN: [id number] Date of Birth: 1961-06-11  Transition of Care [facility]) CM/SW Contact  Shelbie Hutching, RN Phone Number: 01/13/2022, 2:10 PM  Clinical Narrative:    Patient has been accepted to [facility] in [place].  Clinicals and FL2 have been sent.  MD asked to order Chest X ray to eval for TB.  Patient can be admitted to facility on Wed.  Family has toured the facility and really likes it.    Plan for DC on Wed as long as Medically cleared.   Expected Discharge Plan: Memory Care Barriers to Discharge: Other (must enter comment) (looking for memory care/ ALF)  Expected Discharge Plan and Services Expected Discharge Plan: Memory Care   Discharge Planning Services: CM Consult   Living arrangements for the past 2 months: Single Family Home                 DME Arranged: N/A DME Agency: NA       HH Arranged: NA HH Agency: NA         Social Determinants of Health (SDOH) Interventions    Readmission Risk Interventions     No data to display

## 2022-01-13 NOTE — ED Notes (Signed)
Pt cleaned into new brief, chucks and purewick applied to pt.

## 2022-01-13 NOTE — Assessment & Plan Note (Signed)
   Received a dose of IV Synthroid in the ED 07/30, restarted on her home p.o. Synthroid 175 mcg daily starting 07/31.  It is unclear how long she has been off this medication.  As stated above, TSH at 149 and free T4 undetectable.  After receiving IV Synthroid and started back on p.o., her free T4 on day of discharge at 0.44.  Ideally, patient should continue on this medication and have her TSH and free T4 rechecked in 3 months.  At that time, adjustments can be made if needed.

## 2022-01-14 DIAGNOSIS — E039 Hypothyroidism, unspecified: Secondary | ICD-10-CM | POA: Diagnosis not present

## 2022-01-14 LAB — T4, FREE: Free T4: 0.31 ng/dL — ABNORMAL LOW (ref 0.61–1.12)

## 2022-01-14 LAB — BASIC METABOLIC PANEL
Anion gap: 6 (ref 5–15)
BUN: 21 mg/dL — ABNORMAL HIGH (ref 6–20)
CO2: 25 mmol/L (ref 22–32)
Calcium: 8 mg/dL — ABNORMAL LOW (ref 8.9–10.3)
Chloride: 111 mmol/L (ref 98–111)
Creatinine, Ser: 1.01 mg/dL — ABNORMAL HIGH (ref 0.44–1.00)
GFR, Estimated: >60 mL/min (ref >60)
Glucose, Bld: 104 mg/dL — ABNORMAL HIGH (ref 70–99)
Potassium: 4.4 mmol/L (ref 3.5–5.1)
Sodium: 142 mmol/L (ref 135–145)

## 2022-01-14 LAB — T3, FREE: T3, Free: 1 pg/mL — ABNORMAL LOW (ref 2.0–4.4)

## 2022-01-14 MED ORDER — DONEPEZIL HCL 10 MG PO TABS: 10.0000 mg | ORAL_TABLET | Freq: Every day | ORAL | 0 refills | Status: AC

## 2022-01-14 MED ORDER — POLYETHYLENE GLYCOL 3350 17 G PO PACK: 17.0000 g | PACK | Freq: Every day | ORAL | 0 refills | Status: AC | PRN

## 2022-01-14 MED ORDER — PANTOPRAZOLE SODIUM 40 MG PO TBEC: 40.0000 mg | DELAYED_RELEASE_TABLET | Freq: Every day | ORAL | 0 refills | Status: AC

## 2022-01-14 MED ORDER — CLOZAPINE 25 MG PO TABS: 75.0000 mg | ORAL_TABLET | Freq: Every day | ORAL | 0 refills | Status: AC

## 2022-01-14 MED ORDER — CLOZAPINE 200 MG PO TABS: 200.0000 mg | ORAL_TABLET | Freq: Every day | ORAL | 0 refills | Status: AC

## 2022-01-14 MED ORDER — CYANOCOBALAMIN 1000 MCG PO TABS: 1000.0000 ug | ORAL_TABLET | Freq: Every day | ORAL | 0 refills | Status: AC

## 2022-01-14 MED ORDER — LEVOTHYROXINE SODIUM 175 MCG PO TABS: 175.0000 ug | ORAL_TABLET | Freq: Every day | ORAL | 0 refills | Status: AC

## 2022-01-14 MED ORDER — MONTELUKAST SODIUM 10 MG PO TABS: 10.0000 mg | ORAL_TABLET | Freq: Every morning | ORAL | 0 refills | Status: AC

## 2022-01-14 MED ORDER — LIOTHYRONINE SODIUM 5 MCG PO TABS: 5.0000 ug | ORAL_TABLET | Freq: Every morning | ORAL | 0 refills | Status: AC

## 2022-01-14 MED ORDER — TRAZODONE HCL 50 MG PO TABS: 50.0000 mg | ORAL_TABLET | Freq: Every day | ORAL | 0 refills | Status: AC

## 2022-01-14 MED ORDER — ALBUTEROL SULFATE HFA 108 (90 BASE) MCG/ACT IN AERS: 2.0000 | INHALATION_SPRAY | Freq: Four times a day (QID) | RESPIRATORY_TRACT | 0 refills | Status: AC | PRN

## 2022-01-14 MED ORDER — SOLIFENACIN SUCCINATE 5 MG PO TABS: 5.0000 mg | ORAL_TABLET | Freq: Every day | ORAL | 0 refills | Status: AC

## 2022-01-14 MED ORDER — SERTRALINE HCL 50 MG PO TABS: 50.0000 mg | ORAL_TABLET | Freq: Every day | ORAL | 0 refills | Status: AC

## 2022-01-14 MED ORDER — ESTRADIOL 2 MG PO TABS: 2.0000 mg | ORAL_TABLET | Freq: Every day | ORAL | 0 refills | Status: AC

## 2022-01-14 NOTE — Progress Notes (Signed)
Pharmacy - Clozapine     This patient's order has been reviewed for prescribing contraindications.   Clozapine REMS enrollment Verified: RDA code [id number]. Current Outpatient Monitoring: weekly  Home Regimen:  275 mg po at bedtime  Dose Adjustments This Admission: none  Labs:  [place] 12/31/21: 4100  01/07/22: 2400  01/12/22: 5772    Plan: Continue with weekly ANC labs while inpatient. Next [place] 01/20/2022  **The medication is being dispensed pursuant to the FDA REMS suspension order of 05/04/20 that allows for dispensing without a patient REMS dispense authorization (RDA).   Merik Mignano Rodriguez-Guzman PharmD, BCPS 01/14/2022 9:51 AM

## 2022-01-14 NOTE — Progress Notes (Signed)
PROGRESS NOTE    Claire Strong  HYQ:[id number] DOB: 01-Oct-1960  DOA: 12/31/2021 Date of Service: 01/14/22 PCP: Virginia Crews, MD     Brief Narrative / Hospital Course:  Claire Strong is a 61 y.o. female with medical history significant of lupus anticoagulant, asthma, fibromyalgia, depression, anxiety, dementia, hypertension, hypothyroidism, anemia, history of DVT, peripheral arterial disease, CKD 2, dysphagia, eosinophilic esophagitis, diverticulosis, obesity.  Initially presented to ED 12/31/2021, brought by daughter with chief complaint of mental status decline due to dementia.  Had previously been hospitalized in [facility], diagnosed with dementia 10/03/2021, noted psychosis while in the hospital in May 2023.  Patient's daughter stated that since discharge she had been more confused/agitated.  Patient was delusional with auditory hallucinations, stating that she was brought to the hospital to "get the embalming fluid out of me and have my arms and legs cut off".  Initial ED work-up no acute medical processes, patient was kept for psychiatric observation, psychiatry cleared her no need for full IVC, social work was consulted for placement in memory care.  ED reordered home medications.   07/30: Patient had been holding in the ED, until 01/12/2022 noted to be hypotensive with systolic in the 95M, asymptomatic, labs repeated.  TSH 149, free T4 undetectable, mild AKI with creatinine 1.2, urinalysis pending.  Received IV fluids and IV Synthroid, apparently patient had not been getting her Synthroid here as was not in EMR but was located in outside records from her endocrinologist office.  Was admitted to medicine service for hypotension concern for hypothyroidism / overmedication with maintenance antihypertensives / dehydration.  07/31: Blood pressure improving, otherwise stable. Placement has been arranged and awaiting CXR to eval for TB which showed no concerns.  Facility cannot  take her until 01/15/2022.  08/01: BP remains at goal. Cr improved. Plan repeat T4 tomorrow to ensure trending up and will need outpatient TSH recheck in a few weeks.    Consultants:  None (previously seen by psychiatry on initial ED visit, would have low threshold for reconsultation versus neurology)  Procedures: none    Subjective: Patient reports no complaints this morning, no CP/SOB, no lightheaded or dizzy.      ASSESSMENT & PLAN:   Principal Problem:   Severe hypothyroidism Active Problems:   HTN (hypertension)   Lupus anticoagulant disorder (HCC)   Bronchial asthma   Fibromyalgia   PAD (peripheral artery disease) (HCC)   History of gastric bypass   Eosinophilic esophagitis   Obesity   Moderate episode of recurrent major depressive disorder (HCC)   GAD (generalized anxiety disorder)   Memory changes   Dementia (HCC)   Acute renal failure superimposed on stage 2 chronic kidney disease (HCC)   Hypotension   Neuropsychiatric disorder - dementia complicated by MDD, GAD   Overactive bladder   Severe hypothyroidism Received a dose of IV Synthroid in the ED 07/30, restarted on her home p.o. Synthroid 175 mcg daily starting 07/31 Continue po Synthroid Trend T4 tomorrow AM, TSH follow outpatient  Monitor VS closely - have improved   Hypotension Etiology likely multifactorial due to severe hypothyroidism / overmedication with maintenance antihypertensives / dehydration S/p IVF resuscitation and holding meds, BP improved Monitor VS closely Holding home antihypertensives for now, BO has been at goal, restart tomorrow/outpatient pending BP numbers  Addressing hypothyroidism as above  Acute renal failure superimposed on stage 2 chronic kidney disease ([place]) Lab Results  Component Value Date   CREATININE 1.00 01/13/2022   CREATININE 1.20 (H)  01/12/2022   CREATININE 0.88 12/31/2021  Resolved with IV fluids Trend serial BMP  HTN (hypertension) Holding home  antihypertensives due to hypotension  Neuropsychiatric disorder - dementia complicated by MDD, GAD Status post psychiatric evaluation Low threshold for reconsultation vs neurology involvement - appears stable for now  Continue clozapine 275 mg nightly, donepezil 10 mg daily, sertraline 50 mg daily, trazodone 50 mg nightly,  Eosinophilic esophagitis Continue home PPI  Overactive bladder Continue Darifenacin   DVT prophylaxis: lovenox Code Status: FULL Family Communication: none at this time  Disposition Plan / TOC needs: Awaiting placement for dementia with psychotic features, TOC following Barriers to discharge / significant pending items: Hypotension, placement             Objective: Vitals:   01/13/22 2014 01/13/22 2014 01/14/22 0517 01/14/22 0842  BP:  120/75 135/74 131/84  Pulse:  65 68 70  Resp:  20 16 16   Temp:  98 F (36.7 C) 97.6 F (36.4 C) 97.7 F (36.5 C)  TempSrc:  Oral  Oral  SpO2:  100% 100% 100%  Weight: 95.4 kg     Height: 5\' 8"  (1.727 m)      No intake or output data in the 24 hours ending 01/14/22 1557 Filed Weights   12/31/21 1639 01/13/22 2014  Weight: 93.4 kg 95.4 kg    Examination:  Constitutional:  VS as above General Appearance: alert, well-developed, well-nourished, NAD Eyes: Normal lids and conjunctive, non-icteric sclera Ears, Nose, Mouth, Throat: Normal appearance Neck: No masses, trachea midline Respiratory: Normal respiratory effort Breath sounds normal, no wheeze/rhonchi/rales Cardiovascular: S1/S2 normal, no murmur/rub/gallop auscultated No lower extremity edema Gastrointestinal: Nontender, no masses Musculoskeletal:  No clubbing/cyanosis of digits Neurological: No cranial nerve deficit on limited exam Psychiatric: Poor judgment/insight Appears to have normal memory long-term  Normal mood and affect       Scheduled Medications:   cloZAPine  200 mg Oral QHS   cloZAPine  75 mg Oral QHS   darifenacin   7.5 mg Oral Daily   donepezil  10 mg Oral QHS   enoxaparin (LOVENOX) injection  40 mg Subcutaneous Q24H   estradiol  2 mg Oral Daily   fluticasone furoate-vilanterol  1 puff Inhalation Daily   levothyroxine  175 mcg Oral Q0600   liothyronine  5 mcg Oral q morning   montelukast  10 mg Oral q morning   pantoprazole  40 mg Oral Daily   sertraline  50 mg Oral Daily   sodium chloride flush  3 mL Intravenous Q12H   traZODone  50 mg Oral QHS   cyanocobalamin  1,000 mcg Oral Daily    Continuous Infusions:   PRN Medications:  acetaminophen **OR** acetaminophen, albuterol, mouth rinse, polyethylene glycol  Antimicrobials:  Anti-infectives (From admission, onward)    None       Data Reviewed: I have personally reviewed following labs and imaging studies  CBC: Recent Labs  Lab 01/12/22 1953 01/13/22 0522  WBC 7.4 5.0  NEUTROABS 5.7  --   HGB 13.1 11.5*  HCT 42.1 36.7  MCV 95.0 95.1  PLT 183 149   Basic Metabolic Panel: Recent Labs  Lab 01/12/22 1953 01/13/22 0522 01/14/22 0453  NA 140 141 142  K 4.0 4.1 4.4  CL 113* 113* 111  CO2 18* 22 25  GLUCOSE 138* 91 104*  BUN 31* 26* 21*  CREATININE 1.20* 1.00 1.01*  CALCIUM 7.9* 7.3* 8.0*   GFR: Estimated Creatinine Clearance: 71.5 mL/min (A) (by C-G formula based  on SCr of 1.01 mg/dL (H)). Liver Function Tests: Recent Labs  Lab 01/12/22 1953 01/13/22 0522  AST 43* 40  ALT 51* 44  ALKPHOS 122 96  BILITOT 1.0 0.8  PROT 5.9* 4.9*  ALBUMIN 3.1* 2.6*   No results for input(s): "LIPASE", "AMYLASE" in the last 168 hours. No results for input(s): "AMMONIA" in the last 168 hours. Coagulation Profile: No results for input(s): "INR", "PROTIME" in the last 168 hours. Cardiac Enzymes: No results for input(s): "CKTOTAL", "CKMB", "CKMBINDEX", "TROPONINI" in the last 168 hours. BNP (last 3 results) No results for input(s): "PROBNP" in the last 8760 hours. HbA1C: No results for input(s): "HGBA1C" in the last 72  hours. CBG: Recent Labs  Lab 01/13/22 2029  GLUCAP 99   Lipid Profile: No results for input(s): "CHOL", "HDL", "LDLCALC", "TRIG", "CHOLHDL", "LDLDIRECT" in the last 72 hours. Thyroid Function Tests: Recent Labs    01/12/22 1953 01/14/22 0453  TSH 149.738*  --   FREET4 <0.25* 0.31*  T3FREE 1.0*  --    Anemia Panel: No results for input(s): "VITAMINB12", "FOLATE", "FERRITIN", "TIBC", "IRON", "RETICCTPCT" in the last 72 hours. Urine analysis:    Component Value Date/Time   COLORURINE YELLOW (A) 01/12/2022 1815   APPEARANCEUR HAZY (A) 01/12/2022 1815   APPEARANCEUR Clear 09/02/2016 0908   LABSPEC 1.026 01/12/2022 1815   LABSPEC 1.017 07/05/2014 1112   PHURINE 5.0 01/12/2022 1815   GLUCOSEU NEGATIVE 01/12/2022 1815   GLUCOSEU Negative 07/05/2014 1112   HGBUR NEGATIVE 01/12/2022 1815   BILIRUBINUR NEGATIVE 01/12/2022 1815   BILIRUBINUR Negative 07/08/2021 1617   BILIRUBINUR Negative 09/02/2016 0908   BILIRUBINUR Negative 07/05/2014 1112   KETONESUR NEGATIVE 01/12/2022 1815   PROTEINUR NEGATIVE 01/12/2022 1815   UROBILINOGEN 0.2 07/08/2021 1617   NITRITE NEGATIVE 01/12/2022 1815   LEUKOCYTESUR NEGATIVE 01/12/2022 1815   LEUKOCYTESUR Negative 07/05/2014 1112   Sepsis Labs: @LABRCNTIP (procalcitonin:4,lacticidven:4)  No results found for this or any previous visit (from the past 240 hour(s)).       Radiology Studies last 96 hours: DG Chest Port 1 View  Result Date: 01/13/2022 CLINICAL DATA:  Screening for tuberculosis. EXAM: PORTABLE CHEST 1 VIEW COMPARISON:  01/02/2020 FINDINGS: Heart size is normal. Mediastinal shadows are normal. There is linear scarring in the right mid lung and at both lung bases. No evidence of active consolidation, lobar collapse or effusion. No finding specific for granulomatous infection. IMPRESSION: No sign of granulomatous infection. Linear scarring in the right mid lung and at both lung bases. Electronically Signed   By: Nelson Chimes M.D.    On: 01/13/2022 14:27            LOS: 1 day       Emeterio Reeve, DO [facility] 01/14/2022, 3:57 PM   Staff may message me via secure chat in [place]  but this may not receive immediate response,  please page for urgent matters!  If 7PM-7AM, please contact night-coverage [email or web address]  Dictation software was used to generate the above note. Typos may occur and escape review, as with typed/written notes. Please contact Dr Sheppard Coil directly for clarity if needed.

## 2022-01-15 DIAGNOSIS — N179 Acute kidney failure, unspecified: Secondary | ICD-10-CM | POA: Diagnosis not present

## 2022-01-15 DIAGNOSIS — F03918 Unspecified dementia, unspecified severity, with other behavioral disturbance: Secondary | ICD-10-CM

## 2022-01-15 DIAGNOSIS — F05 Delirium due to known physiological condition: Secondary | ICD-10-CM

## 2022-01-15 DIAGNOSIS — E039 Hypothyroidism, unspecified: Secondary | ICD-10-CM | POA: Diagnosis not present

## 2022-01-15 DIAGNOSIS — E669 Obesity, unspecified: Secondary | ICD-10-CM | POA: Diagnosis not present

## 2022-01-15 LAB — BASIC METABOLIC PANEL
Anion gap: 4 — ABNORMAL LOW (ref 5–15)
BUN: 22 mg/dL — ABNORMAL HIGH (ref 6–20)
CO2: 26 mmol/L (ref 22–32)
Calcium: 8 mg/dL — ABNORMAL LOW (ref 8.9–10.3)
Chloride: 112 mmol/L — ABNORMAL HIGH (ref 98–111)
Creatinine, Ser: 1.08 mg/dL — ABNORMAL HIGH (ref 0.44–1.00)
GFR, Estimated: 59 mL/min — ABNORMAL LOW (ref >60)
Glucose, Bld: 97 mg/dL (ref 70–99)
Potassium: 4.2 mmol/L (ref 3.5–5.1)
Sodium: 142 mmol/L (ref 135–145)

## 2022-01-15 LAB — T4, FREE: Free T4: 0.44 ng/dL — ABNORMAL LOW (ref 0.61–1.12)

## 2022-01-15 NOTE — Progress Notes (Signed)
Patient discharged to [facility] with family. Discharge packet given to daughter to give to facility. PIV removed. Pt declined wheelchair to front.

## 2022-01-15 NOTE — Progress Notes (Signed)
Mobility Specialist - Progress Note     01/15/22 1119  Mobility  Activity Transferred from chair to bed  Level of Assistance Independent  Assistive Device None  Distance Ambulated (ft) 2 ft  Activity Response Tolerated well  $Mobility charge 1 Mobility   Pt siting in chair upon entry. Pt independently stood up and transferred into bed. Pt left in bed with needs in reach. Family present at bedside. No complaints.   Candie Mile Mobility Specialist 01/15/22 11:22 AM

## 2022-01-15 NOTE — Progress Notes (Signed)
   01/15/22 0529  Unmeasured Output  Urine Occurrence 1  Stool Occurrence 1  Urine Characteristics  Urinary Incontinence No  Urine Color Yellow/straw  Urine Appearance Clear  Stool Characteristics  Bowel Incontinence No  Stool Type Type 4 (Like a smooth, soft sausage or snake)  Has the patient had three Type 7 stools in the last 24 hours? No  Stool Descriptors Brown  Stool Amount Medium  Stool Source Rectum   Patient was ambulated to the bathroom; lenin and patient where both dry and clean prior to and post void/bowel elimination  [place] Bing, NT

## 2022-01-15 NOTE — TOC Transition Note (Signed)
Transition of Care [facility]) - CM/SW Discharge Note   Patient Details  Name: Claire Strong MRN: [id number] Date of Birth: 01-11-1961  [facility] CM/SW Contact:  Eileen Stanford, LCSW Phone Number: 01/15/2022, 1:01 PM   Clinical Narrative:   FL2 and DC ppwk sent to [facility] with [facility]. She will send to facility.Family will transport pt to facility.      Barriers to Discharge: Other (must enter comment) (looking for memory care/ ALF)   Patient Goals and CMS Choice Patient states their goals for this hospitalization and ongoing recovery are:: Family just wants for patient to be safe CMS Medicare.gov Compare Post Acute Care list provided to:: Patient Represenative (must comment) Choice offered to / list presented to : [place] / [place]  Discharge Placement                       Discharge Plan and Services   Discharge Planning Services: CM Consult            DME Arranged: N/A DME Agency: NA       HH Arranged: NA HH Agency: NA        Social Determinants of Health (SDOH) Interventions     Readmission Risk Interventions     No data to display

## 2022-01-15 NOTE — Discharge Summary (Signed)
Physician Discharge Summary   Patient: Claire Strong MRN: [id number] DOB: 04/11/1961  Admit date:     12/31/2021  Discharge date: 01/15/22  Discharge Physician: Annita Brod   PCP: Virginia Crews, MD   Recommendations at discharge:   Patient being discharged to assisted living with memory care unit. Patient had stopped taking her Closteril 75 mg p.o. 3 times daily +200 mg nightly.  This medication has been restarted. Patient had stopped taking her Synthroid 175 mcg p.o. daily.  This medication has been restarted. New medication: Protonix 40 mg p.o. daily New medication: MiraLAX 17 g p.o. daily as needed Patient's vitamin B12 1000 mcg daily change from sublingual to by mouth. Patient needs to have thyroid function studies including TSH and free T4 checked in 3 months.  See below. Patient is to have a basic metabolic panel, specifically her renal function rechecked in 1 week.  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 female with medical history significant of lupus anticoagulant, asthma, fibromyalgia, depression, anxiety, dementia, hypertension, hypothyroidism, anemia, history of DVT, peripheral arterial disease, CKD 2, dysphagia, eosinophilic esophagitis, diverticulosis, obesity.  Initially  presented to ED 12/31/2021, brought by daughter with chief complaint of mental status decline due to dementia.  Had previously been hospitalized in [facility], diagnosed with dementia 10/03/2021, noted psychosis while in the hospital in May 2023.  Patient's daughter stated that since discharge she had been more confused/agitated.  Patient was delusional with auditory hallucinations, stating that she was brought to the hospital to "get the embalming fluid out of me and have my arms and legs cut off".  Initial ED work-up no acute medical processes, patient was kept for psychiatric observation, psychiatry cleared her no need for full IVC, social work was consulted for placement in memory care.  ED reordered home medications.    Hospital course was complicated by development of hypotension starting 7/30.  At that time, lab work revealed elevated TSH of 149 and undetectable free T4 as well as mild acute kidney injury.  Patient received IV fluids and Synthroid.  Apparently, patient had been on Synthroid at some point, but had not been taking this medication.  Arrangements were made for patient to go to assisted living facility with memory care unit and bed available 8/2.  Assessment and Plan: * Severe hypothyroidism Received a dose of IV Synthroid in the ED 07/30, restarted on her home p.o. Synthroid 175 mcg daily starting 07/31.  It is unclear how long she has been off this medication.  As stated above, TSH at 149 and free T4 undetectable.  After receiving IV Synthroid and started back on p.o., her free T4 on day of discharge at 0.44.  Ideally, patient should continue on this medication and have her TSH and free T4 rechecked in 3 months.  At that time,  adjustments can be made if needed.  Hypotension Etiology likely multifactorial due to severe hypothyroidism / overmedication with maintenance antihypertensives / dehydration S/p IVF resuscitation and holding meds, BP improved  Acute renal failure  superimposed on stage 2 chronic kidney disease ([place]) Secondary to hypotension initially.  Creatinine has since improved although starting to trend back up slightly by time of discharge.  Recommendation is for patient to have a basic metabolic panel checked in 1 week.  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  Restarted on medications, her acute delirium has improved significantly.  Neuropsychiatric disorder - dementia complicated by MDD, GAD Status post psychiatric evaluation Low threshold for reconsultation vs neurology involvement - appears stable for now  Continue clozapine 275 mg nightly, donepezil 10 mg daily, sertraline 50 mg daily, trazodone 50 mg nightly,  Overactive bladder Continue Darifenacin  Obesity (BMI 30-39.9) Meets criteria BMI greater than 30  Eosinophilic esophagitis Continue home PPI  HTN (hypertension) Holding home antihypertensives due to hypotension         Consultants: Psychiatry Procedures performed: None Disposition: Assisted living Diet recommendation:  Regular diet DISCHARGE MEDICATION: Allergies as of 01/15/2022       Reactions   Elemental Sulfur Anaphylaxis   Penicillins Anaphylaxis   Hives and throat swelling   Sulfa Antibiotics Shortness Of Breath   Cefuroxime Axetil Other (See Comments)   Bleeding ulcer   Nutritional Supplements Swelling   Unsure which   Other    All narcotics    Oxycodone-acetaminophen Nausea And Vomiting   Betadine [povidone Iodine] Rash   Latex Rash        Medication List     STOP taking these medications    benazepril 10 MG tablet Commonly known as: LOTENSIN   cetirizine 10 MG tablet Commonly known as: ZYRTEC   citalopram 40 MG tablet Commonly known as: CELEXA   cyclobenzaprine 10 MG tablet Commonly known as: FLEXERIL   EPINEPHrine 0.3 mg/0.3 mL Devi Commonly  known as: EPI-PEN   metoprolol tartrate 25 MG tablet Commonly known as: LOPRESSOR   QUEtiapine 25 MG tablet Commonly known as: SEROquel   SUMAtriptan 50 MG tablet Commonly known as: IMITREX   Vitamin B-12 1000 MCG Subl Replaced by: cyanocobalamin 1000 MCG tablet       TAKE these medications    albuterol 108 (90 Base) MCG/ACT inhaler Commonly known as: VENTOLIN HFA Inhale 2 puffs into the lungs every 6 (six) hours as needed for wheezing or shortness of breath.   cloZAPine 25 MG tablet Commonly known as: CLOZARIL Take 3 tablets (75 mg total) by mouth at bedtime. What changed: additional instructions   clozapine 200 MG tablet Commonly known as: CLOZARIL Take 1 tablet (200 mg total) by mouth at bedtime. What changed:  medication strength additional instructions   cyanocobalamin 1000 MCG tablet Take 1 tablet (1,000 mcg total) by mouth daily. Replaces: Vitamin B-12 1000 MCG Subl   donepezil 10 MG tablet Commonly known as: ARICEPT Take 1 tablet (10 mg total) by mouth at bedtime.   estradiol 2 MG tablet Commonly known as: ESTRACE Take 1 tablet (2 mg total) by mouth daily.   levothyroxine 175 MCG tablet Commonly known as: SYNTHROID Take 1 tablet (175 mcg total) by mouth daily at 6 (six) AM.   liothyronine 5 MCG tablet Commonly known as: CYTOMEL Take 1 tablet (5 mcg total) by mouth every morning.   montelukast 10 MG  tablet Commonly known as: SINGULAIR Take 1 tablet (10 mg total) by mouth every morning.   pantoprazole 40 MG tablet Commonly known as: PROTONIX Take 1 tablet (40 mg total) by mouth daily.   polyethylene glycol 17 g packet Commonly known as: MIRALAX / GLYCOLAX Take 17 g by mouth daily as needed for mild constipation or moderate constipation.   sertraline 50 MG tablet Commonly known as: ZOLOFT Take 1 tablet (50 mg total) by mouth daily.   solifenacin 5 MG tablet Commonly known as: VESICARE Take 1 tablet (5 mg total) by mouth daily.    traZODone 50 MG tablet Commonly known as: DESYREL Take 1 tablet (50 mg total) by mouth at bedtime.        Discharge Exam: Filed Weights   12/31/21 1639 01/13/22 2014  Weight: 93.4 kg 95.4 kg   General: Alert and oriented x2, no acute distress Cardiovascular: Regular rate and rhythm, S1-S2 Lungs: Clear to auscultation bilaterally  Condition at discharge: good  The results of significant diagnostics from this hospitalization (including imaging, microbiology, ancillary and laboratory) are listed below for reference.   Imaging Studies: DG Chest Port 1 View  Result Date: 01/13/2022 CLINICAL DATA:  Screening for tuberculosis. EXAM: PORTABLE CHEST 1 VIEW COMPARISON:  01/02/2020 FINDINGS: Heart size is normal. Mediastinal shadows are normal. There is linear scarring in the right mid lung and at both lung bases. No evidence of active consolidation, lobar collapse or effusion. No finding specific for granulomatous infection. IMPRESSION: No sign of granulomatous infection. Linear scarring in the right mid lung and at both lung bases. Electronically Signed   By: Nelson Chimes M.D.   On: 01/13/2022 14:27    Microbiology: Results for orders placed or performed during the hospital encounter of 12/31/21  Resp Panel by RT-PCR (Flu A&B, Covid) Anterior Nasal Swab     Status: None   Collection Time: 12/31/21  6:33 PM   Specimen: Anterior Nasal Swab  Result Value Ref Range Status   SARS Coronavirus 2 by RT PCR NEGATIVE NEGATIVE Final    Comment: (NOTE) SARS-CoV-2 target nucleic acids are NOT DETECTED.  The SARS-CoV-2 RNA is generally detectable in upper respiratory specimens during the acute phase of infection. The lowest concentration of SARS-CoV-2 viral copies this assay can detect is 138 copies/mL. A negative result does not preclude SARS-Cov-2 infection and should not be used as the sole basis for treatment or other patient management decisions. A negative result may occur with  improper  specimen collection/handling, submission of specimen other than nasopharyngeal swab, presence of viral mutation(s) within the areas targeted by this assay, and inadequate number of viral copies(<138 copies/mL). A negative result must be combined with clinical observations, patient history, and epidemiological information. The expected result is Negative.  Fact Sheet for Patients:  EntrepreneurPulse.com.au  Fact Sheet for Healthcare Providers:  IncredibleEmployment.be  This test is no t yet approved or cleared by the [place] FDA and  has been authorized for detection and/or diagnosis of SARS-CoV-2 by FDA under an Emergency Use Authorization (EUA). This EUA will remain  in effect (meaning this test can be used) for the duration of the COVID-19 declaration under Section 564(b)(1) of the Act, 21 U.S.C.section 360bbb-3(b)(1), unless the authorization is terminated  or revoked sooner.       Influenza A by PCR NEGATIVE NEGATIVE Final   Influenza B by PCR NEGATIVE NEGATIVE Final    Comment: (NOTE) The Xpert Xpress SARS-CoV-2/FLU/RSV plus assay is intended as an aid in the diagnosis  of influenza from Nasopharyngeal swab specimens and should not be used as a sole basis for treatment. Nasal washings and aspirates are unacceptable for Xpert Xpress SARS-CoV-2/FLU/RSV testing.  Fact Sheet for Patients: EntrepreneurPulse.com.au  Fact Sheet for Healthcare Providers: IncredibleEmployment.be  This test is not yet approved or cleared by the [place] FDA and has been authorized for detection and/or diagnosis of SARS-CoV-2 by FDA under an Emergency Use Authorization (EUA). This EUA will remain in effect (meaning this test can be used) for the duration of the COVID-19 declaration under Section 564(b)(1) of the Act, 21 U.S.C. section 360bbb-3(b)(1), unless the authorization is terminated or revoked.  Performed at  [facility], [address] [place], [place] [address]     Labs: CBC: Recent Labs  Lab 01/12/22 1953 01/13/22 0522  WBC 7.4 5.0  NEUTROABS 5.7  --   HGB 13.1 11.5*  HCT 42.1 36.7  MCV 95.0 95.1  PLT 183 191   Basic Metabolic Panel: Recent Labs  Lab 01/12/22 1953 01/13/22 0522 01/14/22 0453 01/15/22 0430  NA 140 141 142 142  K 4.0 4.1 4.4 4.2  CL 113* 113* 111 112*  CO2 18* 22 25 26   GLUCOSE 138* 91 104* 97  BUN 31* 26* 21* 22*  CREATININE 1.20* 1.00 1.01* 1.08*  CALCIUM 7.9* 7.3* 8.0* 8.0*   Liver Function Tests: Recent Labs  Lab 01/12/22 1953 01/13/22 0522  AST 43* 40  ALT 51* 44  ALKPHOS 122 96  BILITOT 1.0 0.8  PROT 5.9* 4.9*  ALBUMIN 3.1* 2.6*   CBG: Recent Labs  Lab 01/13/22 2029  GLUCAP 99    Discharge time spent: less than 30 minutes.  Signed: Annita Brod, MD [facility] 01/15/2022

## 2022-01-15 NOTE — Plan of Care (Signed)

## 2022-01-15 NOTE — Assessment & Plan Note (Signed)
Multifactorial in part due to patient's mental health issues plus severe hypothyroidism and underlying dementia.  Restarted on medications, her acute delirium has improved significantly.

## 2022-01-15 NOTE — Assessment & Plan Note (Signed)
Meets criteria BMI greater than 30 

## 2022-01-16 ENCOUNTER — Telehealth: Payer: Self-pay

## 2022-01-16 ENCOUNTER — Ambulatory Visit: Payer: Self-pay | Admitting: *Deleted

## 2022-01-16 NOTE — Patient Outreach (Addendum)
  Care Coordination [facility] Note Transition Care Management Follow-up Telephone Call Date of discharge and from where: 01/15/2022 [facility] How have you been since you were released from the hospital? Been good at ALF Any questions or concerns? No  Items Reviewed: Did the pt receive and understand the discharge instructions provided? Yes  Medications obtained and verified? Yes , medications from facility Other? No  Any new allergies since your discharge? No  Dietary orders reviewed? Yes Do you have support at home? Yes   Home Care and Equipment/Supplies: Were home health services ordered? no If so, what is the name of the agency? NA  Has the agency set up a time to come to the patient's home? not applicable Were any new equipment or medical supplies ordered?  No What is the name of the medical supply agency? NA Were you able to get the supplies/equipment? not applicable Do you have any questions related to the use of the equipment or supplies? No  Functional Questionnaire: (I = Independent and D = Dependent) ADLs: A  Bathing/Dressing- A  Meal Prep- A  Eating- I  Maintaining continence- A  Transferring/Ambulation- I  Managing Meds- D  Follow up appointments reviewed:  PCP Hospital f/u appt confirmed? Yes  Scheduled to see PROVIDER AT ASSISTED LIVING FACILITY/THIS WILL BE NEW PCP  [facility] f/u appt confirmed? No   Are transportation arrangements needed? No  If their condition worsens, is the pt aware to call PCP or go to the Emergency Dept.? Yes Was the patient provided with contact information for the PCP's office or ED? Yes Was to pt encouraged to call back with questions or concerns? Yes  SDOH assessments and interventions completed:   Yes  Care Coordination Interventions Activated:  No   Care Coordination Interventions:   PATIENT DISCHARGE TO   [facility] in [place] ASKING ABOUT OUT OF FACILITY DNR.  RNCM  REVIEWED CHART AND NOTED PATIENT WAS FULL CODE.  ENCOURAGED DAUGHTER TO HAVE CONVERSATION WITH PROVIDER AND PATIENT AT FACILITY AND MAKE REQUEST FOR PAPERWORK THERE.  Encounter Outcome:  Pt. Visit Completed

## 2022-01-16 NOTE — Telephone Encounter (Signed)
Transition Care Management Unsuccessful Follow-up Telephone Call  Date of discharge and from where:  [facility] 8/2  Attempts:  2nd Attempt  Reason for unsuccessful TCM follow-up call:  Left voice message

## 2022-01-31 DIAGNOSIS — I10 Essential (primary) hypertension: Secondary | ICD-10-CM | POA: Diagnosis not present

## 2022-01-31 DIAGNOSIS — N390 Urinary tract infection, site not specified: Secondary | ICD-10-CM | POA: Diagnosis not present

## 2022-02-05 DIAGNOSIS — Z79899 Other long term (current) drug therapy: Secondary | ICD-10-CM | POA: Diagnosis not present

## 2022-02-06 DIAGNOSIS — R232 Flushing: Secondary | ICD-10-CM | POA: Diagnosis not present

## 2022-02-06 DIAGNOSIS — E039 Hypothyroidism, unspecified: Secondary | ICD-10-CM | POA: Diagnosis not present

## 2022-02-06 DIAGNOSIS — G300 Alzheimer's disease with early onset: Secondary | ICD-10-CM | POA: Diagnosis not present

## 2022-02-06 DIAGNOSIS — J45909 Unspecified asthma, uncomplicated: Secondary | ICD-10-CM | POA: Diagnosis not present

## 2022-02-06 DIAGNOSIS — N3281 Overactive bladder: Secondary | ICD-10-CM | POA: Diagnosis not present

## 2022-02-18 DIAGNOSIS — E039 Hypothyroidism, unspecified: Secondary | ICD-10-CM | POA: Diagnosis not present

## 2022-02-18 DIAGNOSIS — E785 Hyperlipidemia, unspecified: Secondary | ICD-10-CM | POA: Diagnosis not present

## 2022-02-18 DIAGNOSIS — Z79899 Other long term (current) drug therapy: Secondary | ICD-10-CM | POA: Diagnosis not present

## 2022-02-21 DIAGNOSIS — Z79899 Other long term (current) drug therapy: Secondary | ICD-10-CM | POA: Diagnosis not present

## 2022-03-03 DIAGNOSIS — Z79899 Other long term (current) drug therapy: Secondary | ICD-10-CM | POA: Diagnosis not present

## 2022-03-10 DIAGNOSIS — Z79899 Other long term (current) drug therapy: Secondary | ICD-10-CM | POA: Diagnosis not present

## 2022-03-18 DIAGNOSIS — Z79899 Other long term (current) drug therapy: Secondary | ICD-10-CM | POA: Diagnosis not present

## 2022-03-24 DIAGNOSIS — Z79899 Other long term (current) drug therapy: Secondary | ICD-10-CM | POA: Diagnosis not present

## 2022-03-31 DIAGNOSIS — Z79899 Other long term (current) drug therapy: Secondary | ICD-10-CM | POA: Diagnosis not present

## 2022-04-03 DIAGNOSIS — K219 Gastro-esophageal reflux disease without esophagitis: Secondary | ICD-10-CM | POA: Diagnosis not present

## 2022-04-03 DIAGNOSIS — G4700 Insomnia, unspecified: Secondary | ICD-10-CM | POA: Diagnosis not present

## 2022-04-03 DIAGNOSIS — K5900 Constipation, unspecified: Secondary | ICD-10-CM | POA: Diagnosis not present

## 2022-04-07 DIAGNOSIS — Z79899 Other long term (current) drug therapy: Secondary | ICD-10-CM | POA: Diagnosis not present

## 2022-04-08 DIAGNOSIS — E211 Secondary hyperparathyroidism, not elsewhere classified: Secondary | ICD-10-CM | POA: Diagnosis not present

## 2022-04-14 DIAGNOSIS — Z79899 Other long term (current) drug therapy: Secondary | ICD-10-CM | POA: Diagnosis not present

## 2022-04-18 ENCOUNTER — Ambulatory Visit: Payer: Medicare Other | Admitting: Family Medicine

## 2022-04-21 DIAGNOSIS — Z79899 Other long term (current) drug therapy: Secondary | ICD-10-CM | POA: Diagnosis not present

## 2022-04-28 DIAGNOSIS — Z79899 Other long term (current) drug therapy: Secondary | ICD-10-CM | POA: Diagnosis not present

## 2022-04-30 DIAGNOSIS — N3281 Overactive bladder: Secondary | ICD-10-CM | POA: Diagnosis not present

## 2022-04-30 DIAGNOSIS — E039 Hypothyroidism, unspecified: Secondary | ICD-10-CM | POA: Diagnosis not present

## 2022-04-30 DIAGNOSIS — K219 Gastro-esophageal reflux disease without esophagitis: Secondary | ICD-10-CM | POA: Diagnosis not present

## 2022-04-30 DIAGNOSIS — M5459 Other low back pain: Secondary | ICD-10-CM | POA: Diagnosis not present

## 2022-05-05 DIAGNOSIS — Z79899 Other long term (current) drug therapy: Secondary | ICD-10-CM | POA: Diagnosis not present

## 2022-05-07 DIAGNOSIS — E211 Secondary hyperparathyroidism, not elsewhere classified: Secondary | ICD-10-CM | POA: Diagnosis not present

## 2022-05-10 DIAGNOSIS — Z79899 Other long term (current) drug therapy: Secondary | ICD-10-CM | POA: Diagnosis not present

## 2022-05-15 IMAGING — MG MM DIGITAL SCREENING BILAT W/ TOMO AND CAD
8 series · 8 of 24 positions shown · non-contrast
Comparison: Previous exam(s).

CLINICAL DATA: Screening.

EXAM:
DIGITAL SCREENING BILATERAL MAMMOGRAM WITH TOMOSYNTHESIS AND CAD
TECHNIQUE: Bilateral screening digital craniocaudal and mediolateral oblique
mammograms were obtained. Bilateral screening digital breast
tomosynthesis was performed. The images were evaluated with
computer-aided detection.

[R MLO synth-2D]
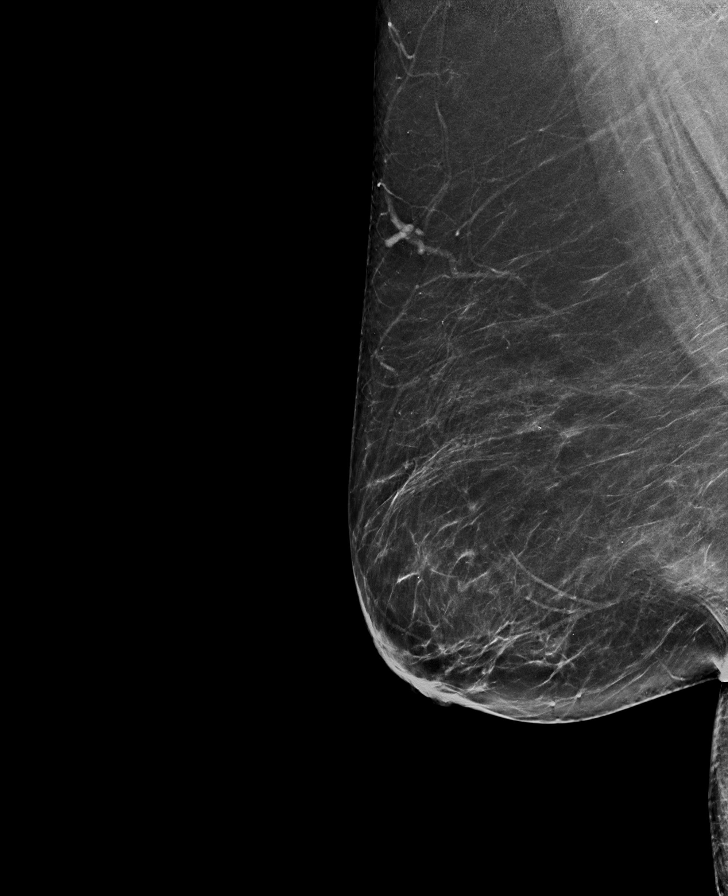

[R CC synth-2D]
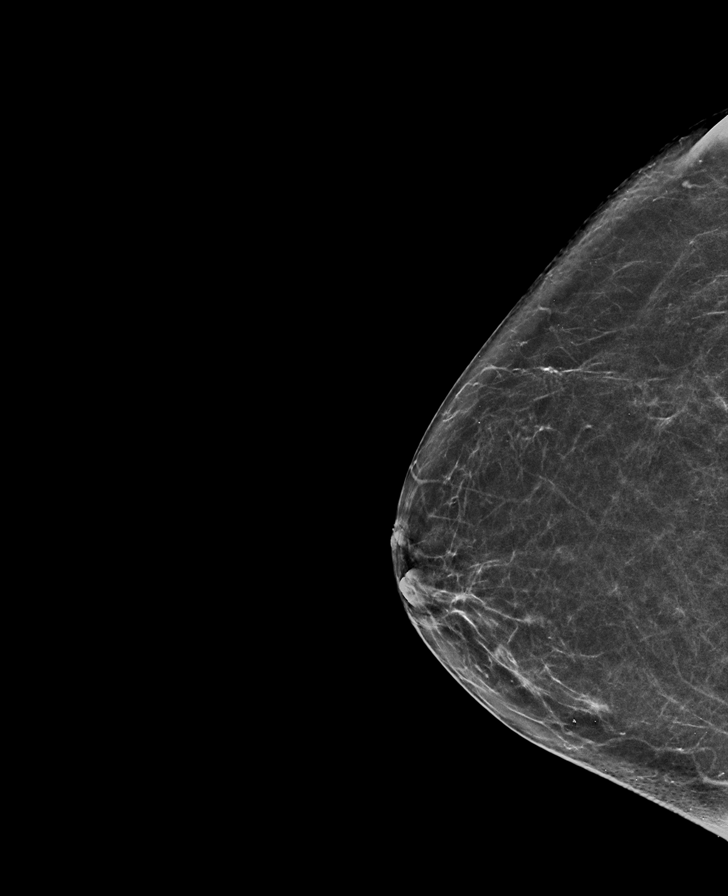

[L MLO synth-2D]
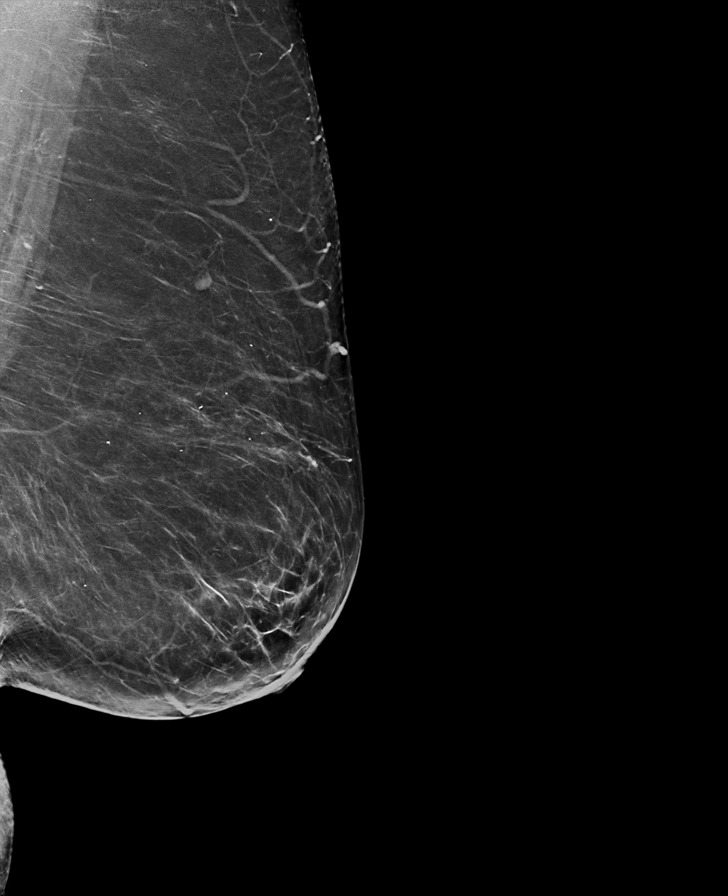

[L CC synth-2D]
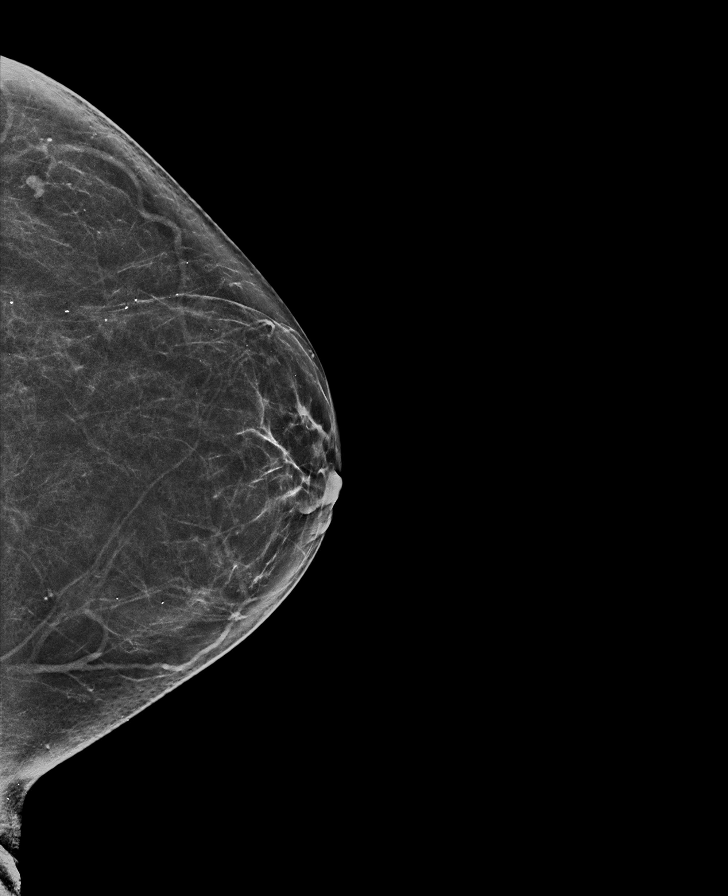

[L MLO tomo · tomo slice 39/77.0]
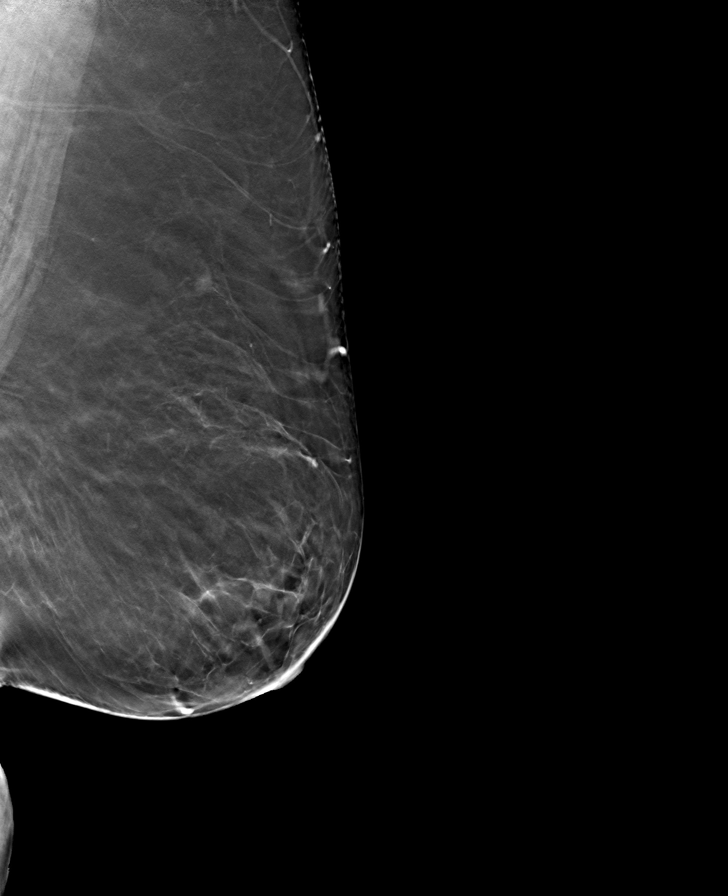

[L CC tomo · tomo slice 33/66.0]
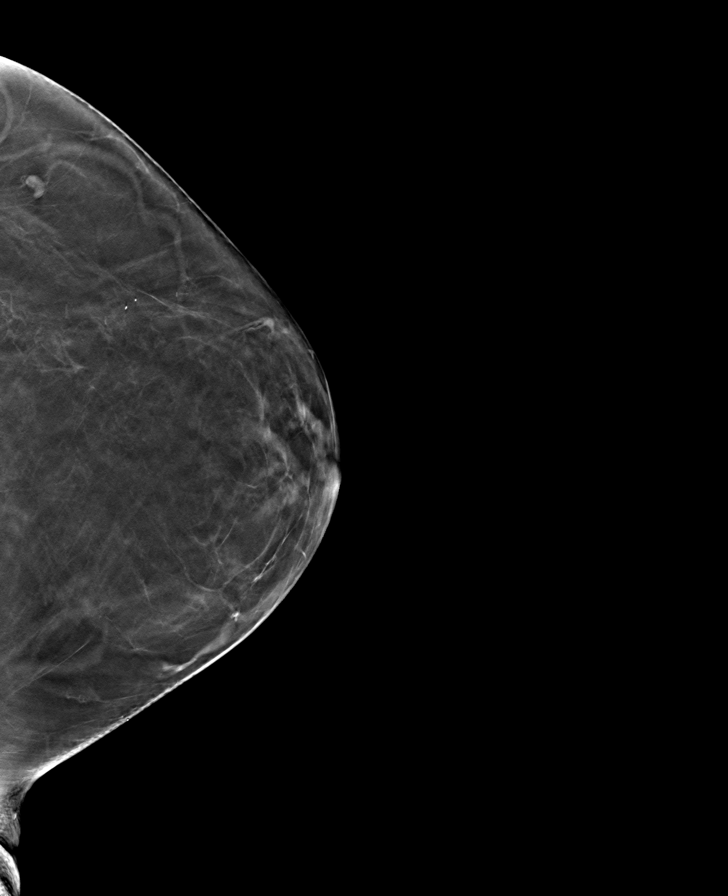

[R MLO tomo · tomo slice 39/78.0]
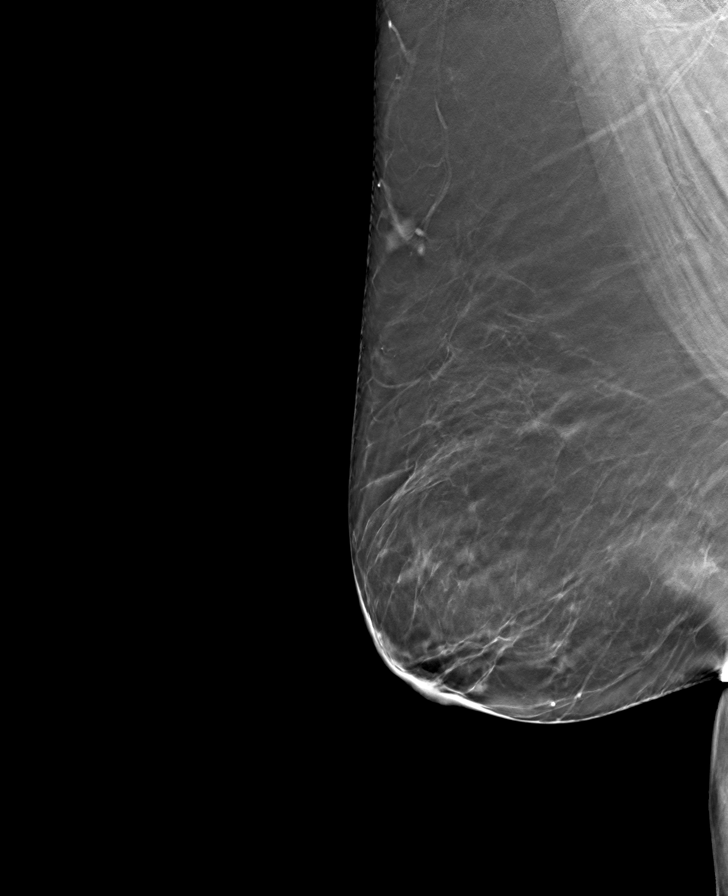

[R CC tomo · tomo slice 32/63.0]
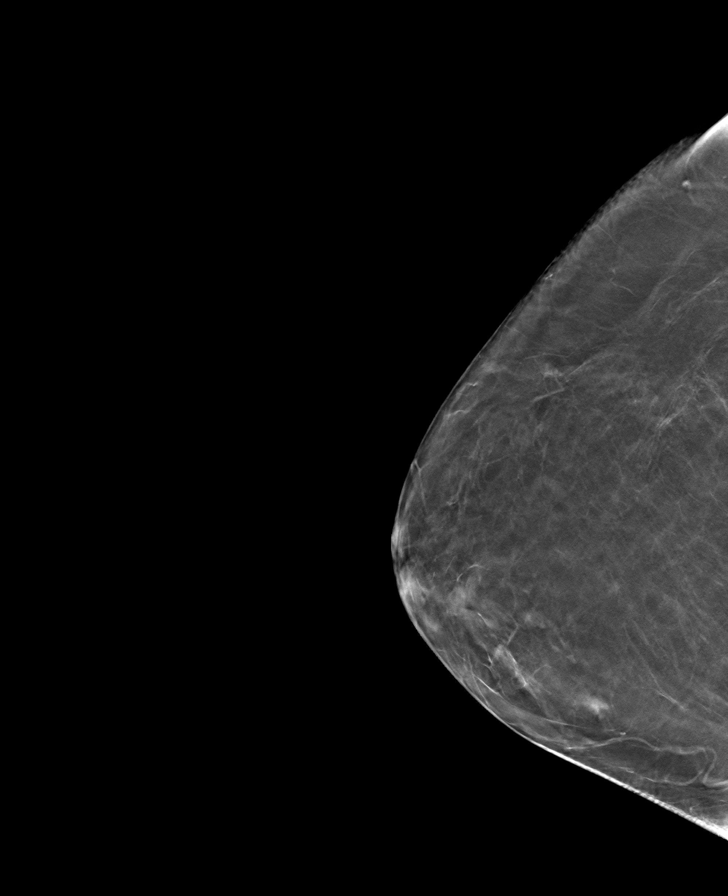

[8 of 24 positions shown; findings below may reference images not displayed]

ACR Breast Density Category b: There are scattered areas of
fibroglandular density.
FINDINGS: There are no findings suspicious for malignancy.
IMPRESSION: No mammographic evidence of malignancy. A result letter of this
screening mammogram will be mailed directly to the patient.

RECOMMENDATION:
Screening mammogram in one year. (Code:51-O-LD2)

BI-RADS CATEGORY  1: Negative.

## 2022-05-19 DIAGNOSIS — Z79899 Other long term (current) drug therapy: Secondary | ICD-10-CM | POA: Diagnosis not present

## 2022-05-26 DIAGNOSIS — Z79899 Other long term (current) drug therapy: Secondary | ICD-10-CM | POA: Diagnosis not present

## 2022-06-02 DIAGNOSIS — K219 Gastro-esophageal reflux disease without esophagitis: Secondary | ICD-10-CM | POA: Diagnosis not present

## 2022-06-02 DIAGNOSIS — J45909 Unspecified asthma, uncomplicated: Secondary | ICD-10-CM | POA: Diagnosis not present

## 2022-06-02 DIAGNOSIS — Z79899 Other long term (current) drug therapy: Secondary | ICD-10-CM | POA: Diagnosis not present

## 2022-06-06 DIAGNOSIS — Z79899 Other long term (current) drug therapy: Secondary | ICD-10-CM | POA: Diagnosis not present

## 2022-06-13 DIAGNOSIS — Z79899 Other long term (current) drug therapy: Secondary | ICD-10-CM | POA: Diagnosis not present

## 2022-07-13 ENCOUNTER — Encounter: Payer: Self-pay | Admitting: Oncology

## 2022-08-05 ENCOUNTER — Encounter: Payer: Self-pay | Admitting: Oncology

## 2022-08-06 ENCOUNTER — Encounter: Payer: Self-pay | Admitting: Oncology

## 2022-08-12 ENCOUNTER — Ambulatory Visit: Payer: Medicare HMO | Admitting: Neurology
